# Patient Record
Sex: Male | Born: 1935 | Race: White | Hispanic: No | Marital: Married | Smoking: Former smoker
Health system: Southern US, Community
[De-identification: ages and names within clinical notes are randomized; demographics above are authoritative.]

## PROBLEM LIST (undated history)

## (undated) DIAGNOSIS — F32A Depression, unspecified: Secondary | ICD-10-CM

## (undated) DIAGNOSIS — M545 Low back pain, unspecified: Secondary | ICD-10-CM

## (undated) DIAGNOSIS — E785 Hyperlipidemia, unspecified: Secondary | ICD-10-CM

## (undated) DIAGNOSIS — R42 Dizziness and giddiness: Secondary | ICD-10-CM

## (undated) DIAGNOSIS — Z950 Presence of cardiac pacemaker: Secondary | ICD-10-CM

## (undated) DIAGNOSIS — I4819 Other persistent atrial fibrillation: Secondary | ICD-10-CM

## (undated) DIAGNOSIS — G8929 Other chronic pain: Secondary | ICD-10-CM

## (undated) DIAGNOSIS — S329XXA Fracture of unspecified parts of lumbosacral spine and pelvis, initial encounter for closed fracture: Secondary | ICD-10-CM

## (undated) DIAGNOSIS — I495 Sick sinus syndrome: Secondary | ICD-10-CM

## (undated) DIAGNOSIS — K529 Noninfective gastroenteritis and colitis, unspecified: Secondary | ICD-10-CM

## (undated) DIAGNOSIS — L989 Disorder of the skin and subcutaneous tissue, unspecified: Secondary | ICD-10-CM

## (undated) DIAGNOSIS — I10 Essential (primary) hypertension: Secondary | ICD-10-CM

## (undated) DIAGNOSIS — M1990 Unspecified osteoarthritis, unspecified site: Secondary | ICD-10-CM

## (undated) DIAGNOSIS — K219 Gastro-esophageal reflux disease without esophagitis: Secondary | ICD-10-CM

## (undated) DIAGNOSIS — F0390 Unspecified dementia without behavioral disturbance: Secondary | ICD-10-CM

## (undated) DIAGNOSIS — R9430 Abnormal result of cardiovascular function study, unspecified: Secondary | ICD-10-CM

## (undated) DIAGNOSIS — H9190 Unspecified hearing loss, unspecified ear: Secondary | ICD-10-CM

## (undated) DIAGNOSIS — I2510 Atherosclerotic heart disease of native coronary artery without angina pectoris: Secondary | ICD-10-CM

## (undated) DIAGNOSIS — F419 Anxiety disorder, unspecified: Secondary | ICD-10-CM

## (undated) DIAGNOSIS — F329 Major depressive disorder, single episode, unspecified: Secondary | ICD-10-CM

## (undated) DIAGNOSIS — J189 Pneumonia, unspecified organism: Secondary | ICD-10-CM

## (undated) DIAGNOSIS — E039 Hypothyroidism, unspecified: Secondary | ICD-10-CM

## (undated) DIAGNOSIS — Z8739 Personal history of other diseases of the musculoskeletal system and connective tissue: Secondary | ICD-10-CM

## (undated) DIAGNOSIS — J449 Chronic obstructive pulmonary disease, unspecified: Secondary | ICD-10-CM

## (undated) HISTORY — DX: Hyperlipidemia, unspecified: E78.5

## (undated) HISTORY — DX: Disorder of the skin and subcutaneous tissue, unspecified: L98.9

## (undated) HISTORY — DX: Dizziness and giddiness: R42

## (undated) HISTORY — DX: Sick sinus syndrome: I49.5

## (undated) HISTORY — DX: Presence of cardiac pacemaker: Z95.0

## (undated) HISTORY — DX: Unspecified osteoarthritis, unspecified site: M19.90

## (undated) HISTORY — DX: Essential (primary) hypertension: I10

## (undated) HISTORY — DX: Unspecified dementia, unspecified severity, without behavioral disturbance, psychotic disturbance, mood disturbance, and anxiety: F03.90

## (undated) HISTORY — DX: Depression, unspecified: F32.A

## (undated) HISTORY — DX: Major depressive disorder, single episode, unspecified: F32.9

## (undated) HISTORY — PX: JOINT REPLACEMENT: SHX530

## (undated) HISTORY — PX: BACK SURGERY: SHX140

## (undated) HISTORY — DX: Hypothyroidism, unspecified: E03.9

## (undated) HISTORY — DX: Anxiety disorder, unspecified: F41.9

## (undated) HISTORY — DX: Gastro-esophageal reflux disease without esophagitis: K21.9

## (undated) HISTORY — DX: Chronic obstructive pulmonary disease, unspecified: J44.9

## (undated) HISTORY — PX: TONSILLECTOMY: SUR1361

## (undated) HISTORY — DX: Atherosclerotic heart disease of native coronary artery without angina pectoris: I25.10

## (undated) HISTORY — DX: Fracture of unspecified parts of lumbosacral spine and pelvis, initial encounter for closed fracture: S32.9XXA

## (undated) HISTORY — DX: Noninfective gastroenteritis and colitis, unspecified: K52.9

## (undated) HISTORY — DX: Abnormal result of cardiovascular function study, unspecified: R94.30

## (undated) HISTORY — DX: Unspecified hearing loss, unspecified ear: H91.90

## (undated) HISTORY — DX: Other persistent atrial fibrillation: I48.19

## (undated) HISTORY — PX: CATARACT EXTRACTION W/ INTRAOCULAR LENS  IMPLANT, BILATERAL: SHX1307

---

## 1973-05-11 HISTORY — PX: LUMBAR SPINE SURGERY: SHX701

## 1999-07-22 ENCOUNTER — Other Ambulatory Visit: Admission: RE | Admit: 1999-07-22 | Discharge: 1999-07-22 | Payer: Self-pay | Admitting: Plastic Surgery

## 1999-07-22 ENCOUNTER — Encounter (INDEPENDENT_AMBULATORY_CARE_PROVIDER_SITE_OTHER): Payer: Self-pay | Admitting: Specialist

## 2002-05-09 ENCOUNTER — Ambulatory Visit (HOSPITAL_COMMUNITY): Admission: RE | Admit: 2002-05-09 | Discharge: 2002-05-09 | Payer: Self-pay | Admitting: Cardiology

## 2002-07-26 ENCOUNTER — Observation Stay (HOSPITAL_COMMUNITY): Admission: AD | Admit: 2002-07-26 | Discharge: 2002-07-26 | Payer: Self-pay | Admitting: Cardiology

## 2004-03-28 ENCOUNTER — Ambulatory Visit: Payer: Self-pay | Admitting: Cardiology

## 2004-04-04 ENCOUNTER — Ambulatory Visit: Payer: Self-pay | Admitting: Cardiology

## 2004-04-11 ENCOUNTER — Ambulatory Visit: Payer: Self-pay | Admitting: Cardiology

## 2004-04-29 ENCOUNTER — Ambulatory Visit: Payer: Self-pay | Admitting: Cardiology

## 2004-05-06 ENCOUNTER — Ambulatory Visit: Payer: Self-pay | Admitting: Cardiology

## 2004-05-27 ENCOUNTER — Ambulatory Visit: Payer: Self-pay | Admitting: Cardiology

## 2004-06-27 ENCOUNTER — Ambulatory Visit: Payer: Self-pay | Admitting: Cardiology

## 2004-06-30 ENCOUNTER — Ambulatory Visit: Payer: Self-pay | Admitting: Cardiology

## 2004-07-14 ENCOUNTER — Ambulatory Visit: Payer: Self-pay | Admitting: Cardiology

## 2004-08-12 ENCOUNTER — Ambulatory Visit: Payer: Self-pay | Admitting: Internal Medicine

## 2004-09-09 ENCOUNTER — Ambulatory Visit: Payer: Self-pay | Admitting: Cardiology

## 2004-10-10 ENCOUNTER — Ambulatory Visit: Payer: Self-pay | Admitting: Cardiology

## 2004-11-14 ENCOUNTER — Ambulatory Visit: Payer: Self-pay | Admitting: Internal Medicine

## 2004-11-24 ENCOUNTER — Ambulatory Visit: Payer: Self-pay | Admitting: Cardiology

## 2004-12-22 ENCOUNTER — Ambulatory Visit: Payer: Self-pay | Admitting: Cardiology

## 2005-01-29 ENCOUNTER — Ambulatory Visit: Payer: Self-pay | Admitting: Cardiology

## 2005-03-23 ENCOUNTER — Ambulatory Visit: Payer: Self-pay | Admitting: Cardiology

## 2005-04-16 ENCOUNTER — Ambulatory Visit: Payer: Self-pay | Admitting: Internal Medicine

## 2005-05-18 ENCOUNTER — Ambulatory Visit: Payer: Self-pay | Admitting: Cardiology

## 2005-05-20 ENCOUNTER — Ambulatory Visit: Payer: Self-pay | Admitting: Cardiology

## 2005-05-27 ENCOUNTER — Ambulatory Visit: Payer: Self-pay | Admitting: Cardiology

## 2005-06-03 ENCOUNTER — Ambulatory Visit: Payer: Self-pay | Admitting: Cardiology

## 2005-06-10 ENCOUNTER — Ambulatory Visit: Payer: Self-pay | Admitting: Cardiology

## 2005-06-23 ENCOUNTER — Ambulatory Visit: Payer: Self-pay | Admitting: Internal Medicine

## 2005-06-29 ENCOUNTER — Ambulatory Visit: Payer: Self-pay | Admitting: Cardiology

## 2005-07-14 ENCOUNTER — Ambulatory Visit: Payer: Self-pay | Admitting: Cardiology

## 2005-07-16 ENCOUNTER — Ambulatory Visit: Payer: Self-pay | Admitting: Internal Medicine

## 2005-07-21 ENCOUNTER — Ambulatory Visit: Payer: Self-pay | Admitting: Cardiology

## 2005-07-22 ENCOUNTER — Ambulatory Visit: Payer: Self-pay | Admitting: Cardiology

## 2005-08-26 ENCOUNTER — Ambulatory Visit: Payer: Self-pay | Admitting: Cardiology

## 2005-10-09 ENCOUNTER — Ambulatory Visit: Payer: Self-pay | Admitting: Cardiology

## 2005-12-09 ENCOUNTER — Ambulatory Visit: Payer: Self-pay | Admitting: Cardiology

## 2005-12-22 ENCOUNTER — Ambulatory Visit: Payer: Self-pay | Admitting: Cardiology

## 2006-01-01 ENCOUNTER — Ambulatory Visit: Payer: Self-pay | Admitting: Internal Medicine

## 2006-01-14 ENCOUNTER — Ambulatory Visit: Payer: Self-pay | Admitting: Internal Medicine

## 2006-01-27 ENCOUNTER — Ambulatory Visit: Payer: Self-pay | Admitting: Internal Medicine

## 2006-02-01 ENCOUNTER — Ambulatory Visit: Payer: Self-pay | Admitting: Internal Medicine

## 2006-02-01 ENCOUNTER — Ambulatory Visit (HOSPITAL_COMMUNITY): Admission: RE | Admit: 2006-02-01 | Discharge: 2006-02-02 | Payer: Self-pay | Admitting: Internal Medicine

## 2006-02-01 HISTORY — PX: INSERT / REPLACE / REMOVE PACEMAKER: SUR710

## 2006-02-03 ENCOUNTER — Ambulatory Visit: Payer: Self-pay | Admitting: Internal Medicine

## 2006-02-12 ENCOUNTER — Ambulatory Visit: Payer: Self-pay | Admitting: Cardiology

## 2006-02-15 ENCOUNTER — Ambulatory Visit: Payer: Self-pay

## 2006-02-19 ENCOUNTER — Ambulatory Visit: Payer: Self-pay | Admitting: Cardiology

## 2006-03-23 ENCOUNTER — Ambulatory Visit: Payer: Self-pay | Admitting: Cardiology

## 2006-04-21 ENCOUNTER — Ambulatory Visit: Payer: Self-pay | Admitting: Cardiology

## 2006-04-23 ENCOUNTER — Ambulatory Visit (HOSPITAL_COMMUNITY): Admission: RE | Admit: 2006-04-23 | Discharge: 2006-04-23 | Payer: Self-pay | Admitting: Orthopedic Surgery

## 2006-04-23 HISTORY — PX: KNEE ARTHROSCOPY: SHX127

## 2006-05-25 ENCOUNTER — Ambulatory Visit: Payer: Self-pay | Admitting: Internal Medicine

## 2006-07-15 ENCOUNTER — Ambulatory Visit: Payer: Self-pay | Admitting: Gastroenterology

## 2006-08-18 ENCOUNTER — Ambulatory Visit: Payer: Self-pay | Admitting: Internal Medicine

## 2006-12-02 ENCOUNTER — Ambulatory Visit: Payer: Self-pay | Admitting: Internal Medicine

## 2007-03-02 ENCOUNTER — Ambulatory Visit: Payer: Self-pay | Admitting: Internal Medicine

## 2007-05-02 ENCOUNTER — Encounter: Payer: Self-pay | Admitting: Orthopedic Surgery

## 2007-05-13 ENCOUNTER — Inpatient Hospital Stay (HOSPITAL_COMMUNITY): Admission: RE | Admit: 2007-05-13 | Discharge: 2007-05-16 | Payer: Self-pay | Admitting: Orthopedic Surgery

## 2007-05-13 HISTORY — PX: TOTAL KNEE ARTHROPLASTY: SHX125

## 2007-06-01 ENCOUNTER — Ambulatory Visit: Payer: Self-pay | Admitting: Internal Medicine

## 2007-09-07 ENCOUNTER — Ambulatory Visit: Payer: Self-pay | Admitting: Internal Medicine

## 2007-11-14 ENCOUNTER — Ambulatory Visit: Payer: Self-pay | Admitting: Internal Medicine

## 2008-02-15 ENCOUNTER — Ambulatory Visit: Payer: Self-pay | Admitting: Internal Medicine

## 2008-05-16 ENCOUNTER — Ambulatory Visit: Payer: Self-pay | Admitting: Internal Medicine

## 2008-08-15 ENCOUNTER — Ambulatory Visit: Payer: Self-pay | Admitting: Internal Medicine

## 2008-08-24 ENCOUNTER — Encounter (INDEPENDENT_AMBULATORY_CARE_PROVIDER_SITE_OTHER): Payer: Self-pay

## 2008-11-19 DIAGNOSIS — I10 Essential (primary) hypertension: Secondary | ICD-10-CM | POA: Insufficient documentation

## 2008-11-19 DIAGNOSIS — I495 Sick sinus syndrome: Secondary | ICD-10-CM

## 2008-11-19 DIAGNOSIS — I4891 Unspecified atrial fibrillation: Secondary | ICD-10-CM

## 2008-11-19 DIAGNOSIS — R42 Dizziness and giddiness: Secondary | ICD-10-CM

## 2008-11-19 DIAGNOSIS — E785 Hyperlipidemia, unspecified: Secondary | ICD-10-CM | POA: Insufficient documentation

## 2008-11-19 DIAGNOSIS — Z950 Presence of cardiac pacemaker: Secondary | ICD-10-CM | POA: Insufficient documentation

## 2008-11-19 DIAGNOSIS — K5190 Ulcerative colitis, unspecified, without complications: Secondary | ICD-10-CM | POA: Insufficient documentation

## 2008-11-20 ENCOUNTER — Ambulatory Visit: Payer: Self-pay | Admitting: Internal Medicine

## 2009-02-27 ENCOUNTER — Ambulatory Visit: Payer: Self-pay | Admitting: Internal Medicine

## 2009-03-07 ENCOUNTER — Encounter: Payer: Self-pay | Admitting: Internal Medicine

## 2009-06-05 ENCOUNTER — Ambulatory Visit: Payer: Self-pay | Admitting: Internal Medicine

## 2009-06-06 ENCOUNTER — Encounter: Payer: Self-pay | Admitting: Internal Medicine

## 2009-06-13 ENCOUNTER — Encounter: Payer: Self-pay | Admitting: Internal Medicine

## 2009-09-04 ENCOUNTER — Ambulatory Visit: Payer: Self-pay | Admitting: Internal Medicine

## 2009-09-20 ENCOUNTER — Encounter: Payer: Self-pay | Admitting: Internal Medicine

## 2009-11-19 ENCOUNTER — Ambulatory Visit: Payer: Self-pay | Admitting: Internal Medicine

## 2010-02-20 ENCOUNTER — Encounter: Payer: Self-pay | Admitting: Internal Medicine

## 2010-02-20 ENCOUNTER — Ambulatory Visit: Payer: Self-pay | Admitting: Internal Medicine

## 2010-03-14 ENCOUNTER — Encounter (INDEPENDENT_AMBULATORY_CARE_PROVIDER_SITE_OTHER): Payer: Self-pay | Admitting: *Deleted

## 2010-04-17 ENCOUNTER — Encounter: Payer: Self-pay | Admitting: Orthopedic Surgery

## 2010-05-06 ENCOUNTER — Encounter: Payer: Self-pay | Admitting: Internal Medicine

## 2010-05-14 ENCOUNTER — Ambulatory Visit
Admission: RE | Admit: 2010-05-14 | Discharge: 2010-05-14 | Payer: Self-pay | Source: Home / Self Care | Attending: Internal Medicine | Admitting: Internal Medicine

## 2010-05-14 ENCOUNTER — Encounter: Payer: Self-pay | Admitting: Internal Medicine

## 2010-05-27 ENCOUNTER — Encounter: Payer: Self-pay | Admitting: Orthopedic Surgery

## 2010-06-05 ENCOUNTER — Ambulatory Visit
Admission: RE | Admit: 2010-06-05 | Discharge: 2010-06-05 | Payer: Self-pay | Source: Home / Self Care | Attending: Orthopedic Surgery | Admitting: Orthopedic Surgery

## 2010-06-05 ENCOUNTER — Encounter: Payer: Self-pay | Admitting: Orthopedic Surgery

## 2010-06-05 DIAGNOSIS — G5790 Unspecified mononeuropathy of unspecified lower limb: Secondary | ICD-10-CM | POA: Insufficient documentation

## 2010-06-05 DIAGNOSIS — IMO0002 Reserved for concepts with insufficient information to code with codable children: Secondary | ICD-10-CM

## 2010-06-05 DIAGNOSIS — M79609 Pain in unspecified limb: Secondary | ICD-10-CM | POA: Insufficient documentation

## 2010-06-05 DIAGNOSIS — M1710 Unilateral primary osteoarthritis, unspecified knee: Secondary | ICD-10-CM | POA: Insufficient documentation

## 2010-06-06 ENCOUNTER — Encounter (INDEPENDENT_AMBULATORY_CARE_PROVIDER_SITE_OTHER): Payer: Self-pay | Admitting: *Deleted

## 2010-06-09 ENCOUNTER — Encounter: Payer: Self-pay | Admitting: Orthopedic Surgery

## 2010-06-11 ENCOUNTER — Encounter: Payer: Self-pay | Admitting: Orthopedic Surgery

## 2010-06-12 NOTE — Letter (Signed)
Summary: History form  History form   Imported By: Ruffin Pyo 06/06/2010 13:28:04  _____________________________________________________________________  External Attachment:    Type:   Image     Comment:   External Document

## 2010-06-12 NOTE — Letter (Signed)
Summary: Remote Device Check  [facility] [place]  [address] [place]   [place], [place] [address]   Phone: [phone]  Fax: [phone]     June 13, 2009 MRN: [id number]   [place], [place]  [address]   Dear Mr. Barnwell,   Your remote transmission was recieved and reviewed by your physician.  All diagnostics were within normal limits for you.  __X___Your next transmission is scheduled for:      September 04, 2009.  Please transmit at any time this day.  If you have a wireless device your transmission will be sent automatically.     Sincerely,  Hotel manager

## 2010-06-12 NOTE — Miscellaneous (Signed)
Summary: Device preload  Clinical Lists Changes  Observations: Added new observation of PPM INDICATN: Sick sinus syndrome (05/06/2010 13:20) Added new observation of MAGNET RTE: BOL 85 ERI 65 (05/06/2010 13:20) Added new observation of PPMLEADSTAT2: active (05/06/2010 13:20) Added new observation of PPMLEADSER2: [id number] (05/06/2010 13:20) Added new observation of PPMLEADMOD2: 5076  (05/06/2010 13:20) Added new observation of PPMLEADDOI2: 02/01/2006  (05/06/2010 13:20) Added new observation of PPMLEADLOC2: RV  (05/06/2010 13:20) Added new observation of PPMLEADSTAT1: active  (05/06/2010 13:20) Added new observation of PPMLEADSER1: [id number]  (05/06/2010 13:20) Added new observation of PPMLEADMOD1: 5076  (05/06/2010 13:20) Added new observation of PPMLEADDOI1: 02/01/2006  (05/06/2010 13:20) Added new observation of PPMLEADLOC1: RA  (05/06/2010 13:20) Added new observation of PPM IMP MD: Cristopher Peru, MD  (05/06/2010 13:20) Added new observation of PPM DOI: 02/01/2006  (05/06/2010 13:20) Added new observation of PPM SERL#: [id number]  (05/06/2010 13:20) Added new observation of PPM MODL#: [id number]  (05/06/2010 13:20) Added new observation of PACEMAKERMFG: Medtronic  (05/06/2010 13:20) Added new observation of PACEMAKER MD: Cristopher Peru, MD  (05/06/2010 13:20)      PPM Specifications Following MD:  Cristopher Peru, MD     PPM Vendor:  Medtronic     PPM Model Number:  [id number]     PPM Serial Number:  [id number] H PPM DOI:  02/01/2006     PPM Implanting MD:  Cristopher Peru, MD  Lead 1    Location: RA     DOI: 02/01/2006     Model #: [id number]     Serial #: [id number]     Status: active Lead 2    Location: RV     DOI: 02/01/2006     Model #: [id number]     Serial #: [id number]     Status: active  Magnet Response Rate:  BOL 85 ERI 65  Indications:  Sick sinus syndrome

## 2010-06-12 NOTE — Cardiovascular Report (Signed)
Summary: Office Visit   Office Visit   Imported By: Sallee Provencal 11/22/2009 11:54:52  _____________________________________________________________________  External Attachment:    Type:   Image     Comment:   External Document

## 2010-06-12 NOTE — Assessment & Plan Note (Signed)
Summary: RT KNEE PAIN BRINGING XR /HUMANA/AETNA/DANIEL/BSF   Vital Signs:  Patient profile:   76 year old male Height:      72 inches Weight:      173 pounds BMI:     23.55 Pulse rate:   78 / minute Resp:     16 per minute  Vitals Entered By: Arther Abbott MD (June 05, 2010 10:42 AM)  Visit Type:  new patient Referring Provider:  Dr. Quillian Quince Primary Provider:  Kern Alberta, MD  CC:  right knee pain.  History of Present Illness:    76 year old male presents with a lateral RIGHT knee pain and pain behind his knee and down his RIGHT leg, which is sharp throbbing, stabbing, and reported 8/10, exacerbated by working, walking, and any movement. He is noted to have improvement with sitting and lying down. The pain is constant and associated with some tingling. He really doesn't have a lot of joint line pain. Anteriorly, medial or lateral.  His pain has been present around 6 months. He did have an episode where he did a lot of walking and started having pain behind his leg. That was diagnosed as a hamstring injury. He denies any major trauma. At that time just increased walking and pain in the back of the leg,.  He's had a LEFT total knee in 2009 he did well and has excellent flexion and function of that leg. That was done by Dr. Chaney Malling in [place].  He has recently been seen by his cardiologist, Dr. Crissie Sickles on January 5. He had excellent checkup at that time.  30 years ago. He also had a lumbar disc surgery.  Xrays [facility], on disc from 04/17/10.  No injury.  Meds: Mesalamine, Simvastatin, Atenolol, Coumadin 17m, ASA 844m Arthritis Tylenol.       Allergies: 1)  ! * Antiflammatory  Past History:  Past Medical History: HTN Dyslipidemia Atrial fib has pacemaker ulcerative colitis  Past Surgical History: implantation of a Medtronic dual chamber pacemaker  02/01/2006   GrChamp MungoTaLovena LeMD    Left knee arthroscopy with debridement  04/23/2006   StDoran HeaterNoVeverly FellsM.D.   Left knee, total knee replacement.   05/13/2007  StDoran HeaterNoVeverly FellsM.D Lower back  Family History: FH of Cancer:  Hx, family, chronic respiratory condition  Social History: Patient is married.  retired no smoking no alcohol 2 cups of coffee daily 12th grade ed  Review of Systems Constitutional:  Complains of fatigue; denies weight loss, weight gain, fever, and chills. Cardiovascular:  Denies chest pain, palpitations, fainting, and murmurs. Respiratory:  Complains of short of breath; denies wheezing, couch, tightness, pain on inspiration, and snoring . Gastrointestinal:  Denies heartburn, nausea, vomiting, diarrhea, constipation, and blood in your stools. Genitourinary:  Denies frequency, urgency, difficulty urinating, painful urination, flank pain, and bleeding in urine. Neurologic:  Denies numbness, tingling, unsteady gait, dizziness, tremors, and seizure. Musculoskeletal:  Complains of joint pain and muscle pain; denies swelling, instability, stiffness, redness, and heat. Endocrine:  Denies excessive thirst, exessive urination, and heat or cold intolerance. Psychiatric:  Complains of depression; denies nervousness, anxiety, and hallucinations. Skin:  Denies changes in the skin, poor healing, rash, itching, and redness. HEENT:  Complains of blurred or double vision; denies eye pain, redness, and watering. Immunology:  Denies seasonal allergies, sinus problems, and allergic to bee stings. Hemoatologic:  Complains of easy bleeding and brusing.  Physical Exam  Additional Exam:  GEN: well developed, well nourished, normal grooming and  hygiene, no deformity and normal body habitus.   CDV: pulses are normal, no edema, no erythema. no tenderness  Lymph: normal lymph nodes   Skin: no rashes, skin lesions or open sores   NEURO: normal coordination, reflexes, sensation.   Psyche: awake, alert and oriented. Mood normal   Gait: seems normal    The upper  extremities have normal appearance, ROM, strength and stability.    LEFT KNEE inspection reveals a normal healed, nontender, incision over the anterior LEFT knee. He has full extension, if not, a little hyperextension , he has normal strength in the limb. He is stable at 106.  RIGHT knee, inspection reveals varus malalignment to the knee. He has very minimal tenderness over the medial or lateral joint line. He does have some swelling over the iliotibial band near Gerdy's tubercle. Tinel sign at the fibular head was normal. His range of motion was approximately 5-90 active with 15 additional flexion passively, but this caused him significant knee pain.  Had a normal hip exam.  He had a normal. Back exam.  Pinprick test was normal. Both legs.  Excellent pulses as noted above.    Inspection ROM Motor Stability    Impression & Recommendations:  Problem # 1:  UNSPECIFIED MONONEURITIS OF LOWER LIMB (ICD-355.8) Assessment New  outside film.  Reviewed with 4 views of the RIGHT knee. He has osteoarthritis of the RIGHT knee with endstage disease.  I would like to work him up for neuralgia/neuritis of the RIGHT lower extremity as a contributing factor to his leg pain. I did not think his lateral leg. Pain is coming from his knee joint. He does have knee osteoarthritis and does have some discomfort there from that and x-rays that do not match his overall symptoms. His x-rays look worse than his symptoms indicate.  Orders: New Patient Level IV (57322)  Problem # 2:  ARTHRITIS, RIGHT KNEE (ICD-716.96) Assessment: New  Orders: New Patient Level IV (02542)  Other Orders: Neurology Referral (Neuro)  Patient Instructions: 1)  Refer TO NEUROLOGIST FOR NCS LOWER EXTREMITIES 2)  COME BACK FOR RESULTS AND PREOP FOR SURGERY   Orders Added: 1)  Neurology Referral [Neuro] 2)  New Patient Level IV [70623]

## 2010-06-12 NOTE — Assessment & Plan Note (Signed)
Summary: [employer]/SAF   Visit Type:  Device check   History of Present Illness: [name] returns today for followup.  He is a pleasant 76 yo man with a h/o symptomatic tachybrady, s/p PPM, HTN, and dyslipidemia.  He c/o pain in his knee and wonders if he needs knee replacement.  No othe complaints today except he notes some memory problems.  Current Medications (verified): 1)  Asacol 400 Mg Tbec (Mesalamine) .... Take 4 Tablets Two Times A Day 2)  Warfarin Sodium 5 Mg Tabs (Warfarin Sodium) .... Use As Directed By Anticoagulation Clinic 3)  Atenolol 100 Mg Tabs (Atenolol) .... Take One Tablet By Mouth Two Times A Day 4)  Simvastatin 80 Mg Tabs (Simvastatin) .... Take 1/2  Tablet By Mouth Daily At Bedtime 5)  Aspirin 81 Mg Tbec (Aspirin) .... Take One Tablet By Mouth Daily  Allergies (verified): No Known Drug Allergies  Past History:  Past Medical History: HTN Dyslipidemia Atrial fib  Review of Systems  The patient denies chest pain, syncope, dyspnea on exertion, and peripheral edema.    Vital Signs:  Patient profile:   76 year old male Height:      73 inches Weight:      175 pounds BMI:     23.17 Pulse rate:   72 / minute Pulse rhythm:   regular Resp:     18 per minute BP sitting:   148 / 90  (left arm) Cuff size:   large  Vitals Entered By: Sidney Ace (May 14, 2010 11:16 AM)  Physical Exam  General:  Well developed, well nourished, in no acute distress.  HEENT: normal Neck: supple. No JVD. Carotids 2+ bilaterally no bruits Cor: RRR no rubs, gallops or murmur Lungs: CTA. Well healed PPM incision. Ab: soft, nontender. nondistended. No HSM. Good bowel sounds Ext: warm. no cyanosis, clubbing or edema Neuro: alert and oriented. Grossly nonfocal. affect pleasant    EKG  Procedure date:  05/14/2010  Findings:      Normal sinus rhythm with rate of: 65. Atrium is paced.    PPM Specifications Following MD:  Cristopher Peru, MD     PPM Vendor:  Medtronic      PPM Model Number:  [id number]     PPM Serial Number:  [id number] PPM DOI:  02/01/2006     PPM Implanting MD:  Cristopher Peru, MD  Lead 1    Location: RA     DOI: 02/01/2006     Model #: 1884     Serial #: [id number]     Status: active Lead 2    Location: RV     DOI: 02/01/2006     Model #: 1093     Serial #: [id number]     Status: active  Magnet Response Rate:  BOL 85 ERI 65  Indications:  Sick sinus syndrome   PPM Follow Up Battery Voltage:  2.78 V     Battery Est. Longevity:  6 YRS       PPM Device Measurements Atrium  Amplitude: 5.60 mV, Impedance: 537 ohms, Threshold: 0.50 V at 0.40 msec Right Ventricle  Amplitude: 5.60 mV, Impedance: 457 ohms, Threshold: 0.750 V at 0.40 msec  Episodes MS Episodes:  357     Percent Mode Switch:  26.7%     Coumadin:  Yes Ventricular High Rate:  0     Atrial Pacing:  89.5%     Ventricular Pacing:  0.8%  Parameters Mode:  DDDR     Lower  Rate Limit:  60     Upper Rate Limit:  130 Paced AV Delay:  300     Sensed AV Delay:  300 Next Remote Date:  08/14/2010     Next Cardiology Appt Due:  05/12/2011 Tech Comments:  357 MODE SWITCHES--LONGEST WAS 76 HOURS. + COUMADIN.  NORMAL DEVICE FUNCTION.  CHANGED RA OUTPUT FROM 1.5 TO 2.0 AND RV OUTPUT FROM 2.0 TO 2.5 V.  CARELINK 08-14-10 AND ROV IN 12 MTHS W/GT. Shelly Bombard  May 14, 2010 11:44 AM  MD Comments:  Agree with above.  Impression & Recommendations:  Problem # 1:  ATRIAL FIBRILLATION (ICD-427.31) He has maintained NSR nicely by PPM interogation today.  He will continue his meds as below. His updated medication list for this problem includes:    Warfarin Sodium 5 Mg Tabs (Warfarin sodium) ..... Use as directed by anticoagulation clinic    Atenolol 100 Mg Tabs (Atenolol) .Marland Kitchen... Take one tablet by mouth two times a day    Aspirin 81 Mg Tbec (Aspirin) .Marland Kitchen... Take one tablet by mouth daily  Problem # 2:  CARDIAC PACEMAKER IN SITU (ICD-V45.01) His device is working normally. Will recheck in several  months.

## 2010-06-12 NOTE — Letter (Signed)
Summary: *Orthopedic Consult Note  [facility]  [address] [place] [address]  [place], [place] [address]   Phone: [phone]  Fax: [phone]    Re:    WHITT AULETTA DOB:    [birth year]   Dear: Dr. Quillian Quince    Thank you for requesting that we see the above patient for consultation.  A copy of the detailed office note will be sent under separate cover, for your review.  Evaluation today is consistent with: osteoarthritis RIGHT knee with endstage disease. Mononeuritis RIGHT leg.  Her recommendations for nerve conduction study followup visit and then discuss total knee replacement.  We will also try to correspond with the cardiologist regarding knee replacement. I understand that he had a recent checkup for his heart and it checked out very well. We would like to have a written statement regarding clearance for knee replacement surgery.   Thank you for this opportunity to look after your patient.  Sincerely,   Demetrius Revel. MD.

## 2010-06-12 NOTE — Cardiovascular Report (Signed)
Summary: Office Visit Remote   Office Visit Remote   Imported By: Sallee Provencal 09/27/2009 15:31:48  _____________________________________________________________________  External Attachment:    Type:   Image     Comment:   External Document

## 2010-06-12 NOTE — Assessment & Plan Note (Signed)
Summary: pc2    Visit Type:  Follow-up Primary Provider:  Kern Alberta, MD   History of Present Illness: [name] returns today for followup.  He has a h/o symptomatic brady-tachy syndrome and HTN, and is s/p PPM.  He notes that he can feel when he goes into atrial fib and that this may be occurring more frequently.  He denies c/p or sob.  He admits to being fairly sedentary as it has been so hot.  Current Medications (verified): 1)  Atenolol 100 Mg Tabs (Atenolol) .... Take One Tablet By Mouth Twice A Day 2)  Aspirin 81 Mg Tbec (Aspirin) .... Take One Tablet By Mouth Daily 3)  Multivitamins   Tabs (Multiple Vitamin) .Marland Kitchen.. 1 By Mouth Once Daily 4)  Simvastatin 80 Mg Tabs (Simvastatin) .... Take Half  Tablet By Mouth Daily At Bedtime 5)  Warfarin Sodium 5 Mg Tabs (Warfarin Sodium) .... Use As Directed By Anticoagulation Clinic 6)  Asacol 400 Mg Tbec (Mesalamine) .... 3 By Mouth Once Daily  Allergies: 1)  ! * Antiflammatory  Past History:  Past Medical History: Last updated: 11/19/2008 ULCERATIVE COLITIS (ICD-556.9) PACEMAKER, PERMANENT (ICD-V45.01) DYSLIPIDEMIA (ICD-272.4) BRADYCARDIA-TACHYCARDIA SYNDROME (ICD-427.81) COUMADIN THERAPY (ICD-V58.61) HYPERTENSION (ICD-401.9) DIZZINESS (ICD-780.4) ? of BRADYCARDIA (ICD-427.89) PAROXYSMAL ATRIAL FIBRILLATION (ICD-427.31)  Past Surgical History: Last updated: 11/19/2008 implantation of a Medtronic dual chamber pacemaker  02/01/2006   [name] [name], MD    Left knee arthroscopy with debridement  04/23/2006  [name], M.D.   Left knee, total knee replacement.   05/13/2007  [name], M.D  Review of Systems  The patient denies chest pain, syncope, dyspnea on exertion, and peripheral edema.    Vital Signs:  Patient profile:   76 year old male Height:      73 inches Weight:      181 pounds BMI:     23.97 Pulse rate:   88 / minute BP sitting:   138 / 74  (left arm)  Vitals Entered By: Margaretmary Bayley CMA (November 19, 2009 1:57 PM)  Physical Exam  General:  Well developed, well nourished, in no acute distress. Head:  normocephalic and atraumatic Eyes:  PERRLA/EOM intact; conjunctiva and lids normal. Mouth:  Teeth, gums and palate normal. Oral mucosa normal. Neck:  Neck supple, no JVD. No masses, thyromegaly or abnormal cervical nodes. Chest Wall:  Well healed PPM incision. Lungs:  Clear bilaterally with no wheezes, rales, or rhonchi or increased work of breathing. Heart:  RRR with normal S1 and S2.  PMI is not enlarged or laterally displaced.  No murmurs were appreciated. Abdomen:  Bowel sounds positive; abdomen soft and non-tender without masses, organomegaly, or hernias noted. No hepatosplenomegaly. Msk:  Back normal, normal gait. Muscle strength and tone normal. Pulses:  pulses normal in all 4 extremities Extremities:  No clubbing or cyanosis.  No edema. Neurologic:  Alert and oriented x 3.   PPM Specifications Following MD:  Cristopher Peru, MD     PPM Vendor:  Medtronic     PPM Model Number:  [id number]     PPM Serial Number:  [id number] PPM DOI:  02/01/2006     PPM Implanting MD:  Cristopher Peru, MD  Lead 1    Location: RA     DOI: 02/01/2006     Model #: [id number]     Serial #: [id number]     Status: active Lead 2    Location: RV     DOI: 02/01/2006  Model #: [id number]     Serial #: [id number]     Status: active   Indications:  Tachy-Brady Syndrome   PPM Follow Up Remote Check?  No Battery Voltage:  2.78 V     Battery Est. Longevity:  6.5 years     Pacer Dependent:  No       PPM Device Measurements Atrium  Amplitude: 4.0 mV, Impedance: 505 ohms, Threshold: 0.5 V at 0.4 msec Right Ventricle  Amplitude: 5.6 mV, Impedance: 451 ohms, Threshold: 0.75 V at 0.4 msec  Episodes MS Episodes:  713     Percent Mode Switch:  28%     Coumadin:  Yes Ventricular High Rate:  0     Atrial Pacing:  86.5%     Ventricular Pacing:  1.0%  Parameters Mode:  DDDR     Lower Rate Limit:  60     Upper Rate Limit:   130 Paced AV Delay:  300     Sensed AV Delay:  300 Next Remote Date:  02/20/2010     Next Cardiology Appt Due:  05/11/2010 Tech Comments:  No parameter changes.  28% A-fib with controlled ventricular rates, + coumadin.  [facility]link transmissions every 3 months.  ROV 6 months with Dr. [name]. Alma Friendly, LPN  November 20, 4191 7:90 PM  MD Comments:  Agree with above.  Impression & Recommendations:  Problem # 1:  PACEMAKER, PERMANENT (ICD-V45.01) His device is working normally.  Will recheck in several months as he has been having more atrial fib.  Problem # 2:  BRADYCARDIA-TACHYCARDIA SYNDROME (ICD-427.81) He has remained minimally asymptomatic. His updated medication list for this problem includes:    Atenolol 100 Mg Tabs (Atenolol) .Marland Kitchen... Take one tablet by mouth twice a day    Aspirin 81 Mg Tbec (Aspirin) .Marland Kitchen... Take one tablet by mouth daily    Warfarin Sodium 5 Mg Tabs (Warfarin sodium) ..... Use as directed by anticoagulation clinic  Problem # 3:  PAROXYSMAL ATRIAL FIBRILLATION (ICD-427.31) He has had atrial fib 28% of the time.  If this increases then we will start amipodarone or another anti-arrythmic. His updated medication list for this problem includes:    Atenolol 100 Mg Tabs (Atenolol) .Marland Kitchen... Take one tablet by mouth twice a day    Aspirin 81 Mg Tbec (Aspirin) .Marland Kitchen... Take one tablet by mouth daily    Warfarin Sodium 5 Mg Tabs (Warfarin sodium) ..... Use as directed by anticoagulation clinic  Problem # 4:  HYPERTENSION (ICD-401.9) His blood pressure is minimally elevated. A low sodium diet is recommended. His updated medication list for this problem includes:    Atenolol 100 Mg Tabs (Atenolol) .Marland Kitchen... Take one tablet by mouth twice a day    Aspirin 81 Mg Tbec (Aspirin) .Marland Kitchen... Take one tablet by mouth daily  Patient Instructions: 1)  Your physician recommends that you schedule a follow-up appointment in: 6 months 2)  Your physician recommends that you continue on your current  medications as directed. Please refer to the Current Medication list given to you today.

## 2010-06-12 NOTE — Miscellaneous (Signed)
Summary: faxed referral to Dr. Merlene Laughter for ncs lowers  Clinical Lists Changes

## 2010-06-12 NOTE — Cardiovascular Report (Signed)
Summary: Office Visit Remote   Office Visit Remote   Imported By: Sallee Provencal 06/17/2009 16:38:20  _____________________________________________________________________  External Attachment:    Type:   Image     Comment:   External Document

## 2010-06-12 NOTE — Letter (Signed)
Summary: Remote Device Check  [facility]  [address] [place]   [place], [place] [address]   Phone: [phone]  Fax: [phone]     March 14, 2010 MRN: [id number]   [place], [place]  [address]   Dear Mr. Hart,   Your remote transmission was recieved and reviewed by your physician.  All diagnostics were within normal limits for you.  _____Your next transmission is scheduled for:                       .  Please transmit at any time this day.  If you have a wireless device your transmission will be sent automatically.  __X____Your next office visit is scheduled for: January 2012 with Dr. [name].                                Sincerely,  Alma Friendly, LPN

## 2010-06-12 NOTE — Letter (Signed)
Summary: Remote Device Check  [facility] [place]  [address] [place]   [place], [place] [address]   Phone: [phone]  Fax: [phone]     Sep 20, 2009 MRN: [id number]   [place], [place]  [address]   Dear Mr. Nez,   Your remote transmission was recieved and reviewed by your physician.  All diagnostics were within normal limits for you.   __X____Your next office visit is scheduled for:   11-19-09 @ 2pm. Please call our office to schedule an appointment.    Sincerely,  Shelly Bombard

## 2010-06-12 NOTE — Cardiovascular Report (Signed)
Summary: Office Visit Remote   Office Visit Remote   Imported By: Sallee Provencal 03/18/2010 15:28:32  _____________________________________________________________________  External Attachment:    Type:   Image     Comment:   External Document

## 2010-06-16 ENCOUNTER — Telehealth: Payer: Self-pay | Admitting: Orthopedic Surgery

## 2010-06-18 NOTE — Miscellaneous (Signed)
  Clinical Lists Changes     Dr. Cristopher Peru cleared for surgery; says he is low cardiac risk. suggested placing a magnet over the PPM during surgery.

## 2010-06-26 NOTE — Progress Notes (Signed)
----   Converted from flag ---- ---- 06/16/2010 9:44 AM, Sophronia Simas, MD, [facility] wrote: No need in the absence of a prior stroke or mechanical heart valve.  ---- 06/09/2010 10:10 AM, Arther Abbott MD wrote: do we need to bridge with lovenox prior to surgery when we take him off coumadin and if so   how do we do that in terms of dosing etc.?   ---- 06/09/2010 9:28 AM, Sophronia Simas, MD, [facility] wrote: He may proceed with surgery. Low cardiac risk. May place a magnet over PPM during surgery and remove after surgery. Cristopher Peru  ---- 06/05/2010 12:00 PM, Arther Abbott MD wrote: Dear Dr [name]   Mr Vanhoesen is planning surgery on his right knee, total knee repalcement. Can you send [place] a written cardiac clearance?   As far as bridging for the coumadin, any advice ?   Dr Aline Brochure ------------------------------

## 2010-06-26 NOTE — Progress Notes (Signed)
Summary: cardilogy   ---- Converted from flag ---- ---- 06/16/2010 9:44 AM, Sophronia Simas, MD, [facility] wrote: No need in the absence of a prior stroke or mechanical heart valve.  ---- 06/09/2010 10:10 AM, Arther Abbott MD wrote: do we need to bridge with lovenox prior to surgery when we take him off coumadin and if so   how do we do that in terms of dosing etc.?   ---- 06/09/2010 9:28 AM, Sophronia Simas, MD, [facility] wrote: He may proceed with surgery. Low cardiac risk. May place a magnet over PPM during surgery and remove after surgery. [name]  ---- 06/05/2010 12:00 PM, Arther Abbott MD wrote: Dear Dr [name]   [name] is planning surgery on his right knee, total knee repalcement. Can you send [place] a written cardiac clearance?   As far as bridging for the coumadin, any advice ?   Dr Aline Brochure ------------------------------

## 2010-07-15 ENCOUNTER — Encounter: Payer: Self-pay | Admitting: Orthopedic Surgery

## 2010-07-16 ENCOUNTER — Ambulatory Visit (INDEPENDENT_AMBULATORY_CARE_PROVIDER_SITE_OTHER): Payer: Medicare HMO | Admitting: Orthopedic Surgery

## 2010-07-16 ENCOUNTER — Encounter: Payer: Self-pay | Admitting: Orthopedic Surgery

## 2010-07-16 DIAGNOSIS — G5780 Other specified mononeuropathies of unspecified lower limb: Secondary | ICD-10-CM | POA: Insufficient documentation

## 2010-07-22 NOTE — Assessment & Plan Note (Signed)
Summary: ncs results/report scanned/bsf   Visit Type:  Follow-up Referring Provider:  Dr. Quillian Quince Primary Provider:  Kern Alberta, MD  CC:  right knee pain.  History of Present Illness:    76 year old male presents with a lateral RIGHT knee pain and pain behind his knee and down his RIGHT leg, which is sharp throbbing, stabbing, and reported 8/10, exacerbated by working, walking, and any movement. He is noted to have improvement with sitting and lying down. The pain is constant and associated with some tingling. He really doesn't have a lot of joint line pain. Anteriorly, medial or lateral.  He's had a LEFT total knee in 2009 he did well and has excellent flexion and function of that leg. That was done by Dr. Chaney Malling in [place].  30 years ago. He also had a lumbar disc surgery.  Xrays [facility], on disc from 04/17/10.  Meds: Mesalamine, Simvastatin, Atenolol, Coumadin 85m, ASA 879m Arthritis Tylenol.  Today is here for NCS results taken by Dr. DoFreddie Apleyffice on 06/11/10, report for review.  Note for surgical clearance for right TKA in EMR 06/16/10.  This patient continues to have pain on the lateral part of his leg radiating down his leg into his foot and some pain occasionally medially in the leg. He continues to say that his knee does not hurt. He does have some functional deficits in the RIGHT lower extremity with occasional giving way and difficulty standing from a seated position.        Allergies: 1)  ! 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  I reviewed this with the patient and his wife. I think he has neuropathy and weakness in his RIGHT lower extremity, which can be managed with medication and therapy. Its worth a try before undergoing major reconstructive knee surgery  with a guarded outcome regarding relief of his pain.  Orders: Est. Patient Level II ([id number] Problem # 2:  ARTHRITIS, RIGHT KNEE (ICD-716.96) Assessment: Unchanged  Orders: Physical Therapy Referral (PT) Est. Patient Level II ([id number] Medications Added to Medication List This Visit: 1)  Neurontin 100 Mg Caps (Gabapentin) .... Start with 1 tablet at night increase to 2 tablets if no improvement after 3 doses  Patient Instructions: 1)  Start new medication for leg pain 2)  Start therapy for the leg 3)  come back in  4)  Please schedule a follow-up appointment in 3 weeks. Prescriptions: NEURONTIN 100 MG CAPS (GABAPENTIN) start with 1 tablet at night increase to 2 tablets if no improvement after 3 doses  #60 x 1   Entered and Authorized by:   StArther AbbottD   Signed by:   StArther AbbottD on 07/16/2010   Method used:   Print then Give to Patient   RxID:   [id number]  Orders Added: 1)  Physical Therapy Referral [PT] 2)  Est. Patient Level II [9[03754]

## 2010-07-29 NOTE — Miscellaneous (Signed)
Summary: PT plan of care  PT plan of care   Imported By: Ruffin Pyo 07/25/2010 11:09:09  _____________________________________________________________________  External Attachment:    Type:   Image     Comment:   External Document

## 2010-07-29 NOTE — Progress Notes (Signed)
Summary: Surgery clearance note  Surgery clearance note   Imported By: Ruffin Pyo 07/25/2010 11:39:26  _____________________________________________________________________  External Attachment:    Type:   Image     Comment:   External Document

## 2010-08-14 ENCOUNTER — Ambulatory Visit (INDEPENDENT_AMBULATORY_CARE_PROVIDER_SITE_OTHER): Payer: Medicare HMO | Admitting: *Deleted

## 2010-08-14 DIAGNOSIS — Z950 Presence of cardiac pacemaker: Secondary | ICD-10-CM

## 2010-08-14 DIAGNOSIS — R0989 Other specified symptoms and signs involving the circulatory and respiratory systems: Secondary | ICD-10-CM

## 2010-08-14 DIAGNOSIS — I495 Sick sinus syndrome: Secondary | ICD-10-CM

## 2010-08-14 DIAGNOSIS — R079 Chest pain, unspecified: Secondary | ICD-10-CM

## 2010-08-19 ENCOUNTER — Institutional Professional Consult (permissible substitution): Payer: Medicare HMO | Admitting: Cardiovascular Disease

## 2010-08-19 ENCOUNTER — Encounter: Payer: Self-pay | Admitting: Orthopedic Surgery

## 2010-08-19 ENCOUNTER — Ambulatory Visit (INDEPENDENT_AMBULATORY_CARE_PROVIDER_SITE_OTHER): Payer: Medicare HMO | Admitting: Orthopedic Surgery

## 2010-08-19 DIAGNOSIS — M1710 Unilateral primary osteoarthritis, unspecified knee: Secondary | ICD-10-CM

## 2010-08-19 DIAGNOSIS — IMO0002 Reserved for concepts with insufficient information to code with codable children: Secondary | ICD-10-CM

## 2010-08-19 MED ORDER — GABAPENTIN 100 MG PO CAPS: ORAL_CAPSULE | ORAL | Status: DC

## 2010-08-19 MED ORDER — GABAPENTIN 100 MG PO CAPS: 100.0000 mg | ORAL_CAPSULE | Freq: Three times a day (TID) | ORAL | Status: DC

## 2010-08-19 NOTE — Progress Notes (Signed)
Pacer remote check

## 2010-08-19 NOTE — Progress Notes (Signed)
Followup visit for diagnosis osteoarthritis, RIGHT knee and also radicular pain, RIGHT leg.  Patient started on Neurontin 100 mg at night with some relief. Patient was able to put out 20 bales of pine needles, and he's been doing yard work. He still has some knee pain and some knee stiffness with a flexion contracture, but it is, overall activity has been improved on Neurontin. He is sleeping a little bit better with it.  Past Medical History  Diagnosis Date  . Hypertension   . Dyslipidemia   . Atrial fibrillation    Past Surgical History  Procedure Date  . Implantation of a medtronic dual chamber pacemaker 02/01/06    [name] [name] MD  . Left knee arthroscopy with debridement 04/23/2006    [name] M.D  . Left knee total knee  replacement 05/13/07    [name] M.D.    Ros: He had some chest pain, and arm numbness in his going to see a cardiologist. He did have a stress test there was some abnormality, which needs followup  I think at this point, we can increase the medication as he has had no side effects. I would like him to go to 200 mg at night and then 300 mg if he has not felt a change in his symptoms.  Recommend followup in 6 weeks

## 2010-09-09 ENCOUNTER — Encounter: Payer: Self-pay | Admitting: Cardiology

## 2010-09-09 ENCOUNTER — Encounter: Payer: Self-pay | Admitting: *Deleted

## 2010-09-09 ENCOUNTER — Other Ambulatory Visit: Payer: Self-pay | Admitting: *Deleted

## 2010-09-09 ENCOUNTER — Ambulatory Visit (INDEPENDENT_AMBULATORY_CARE_PROVIDER_SITE_OTHER): Payer: Medicare HMO | Admitting: Cardiology

## 2010-09-09 DIAGNOSIS — I4891 Unspecified atrial fibrillation: Secondary | ICD-10-CM

## 2010-09-09 DIAGNOSIS — R0602 Shortness of breath: Secondary | ICD-10-CM

## 2010-09-09 DIAGNOSIS — I10 Essential (primary) hypertension: Secondary | ICD-10-CM

## 2010-09-09 DIAGNOSIS — E785 Hyperlipidemia, unspecified: Secondary | ICD-10-CM

## 2010-09-09 DIAGNOSIS — R9430 Abnormal result of cardiovascular function study, unspecified: Secondary | ICD-10-CM

## 2010-09-09 NOTE — Assessment & Plan Note (Signed)
The blood pressure is at target. No change in medications is indicated. We will continue with therapeutic lifestyle changes (TLC).  

## 2010-09-09 NOTE — Progress Notes (Signed)
HPI The patient presents for evaluation of an abnormal stress test. He has seen Dr. [name] in the past for tachybradycardia syndrome and does have a pacemaker. Recently he was being considered for knee replacement but actually is not going to have this. Because he had complaints of chest discomfort he had a stress perfusion study. I reviewed this and it suggested a perfusion defect in the inferior wall consistent with a fairly large area of ischemia. The EF was 60%.  The patient has no prior history of coronary disease stress testing. He has had burning chest discomfort off and on since January. He had one severe episode but otherwise it has been somewhat moderate. He is relatively sedentary. With his levels of activity he does not get chest pressure, neck or arm discomfort. He does not report any presyncope or syncope. He does still occasionally gets some sporadic chest discomfort that is less severe than the episode mentioned. His discomfort as a substernal burning. He doesn't bring it on with his activities though again he is not overly exerting himself. There is no radiation to his neck or to his arms. He does not report associated nausea vomiting or diaphoresis though he might get somewhat short of breath. He typically goes away spontaneously and he's not sure how long it lasts. He had never had this problem before.  Allergies  Allergen Reactions  . Nsaids     colitis    Current Outpatient Prescriptions  Medication Sig Dispense Refill  . aspirin (ASPIRIN LOW DOSE) 81 MG EC tablet Take 81 mg by mouth daily.        Marland Kitchen atenolol (TENORMIN) 100 MG tablet Take 100 mg by mouth 2 (two) times daily. Take one tablet by mouth two times a day       . gabapentin (NEURONTIN) 100 MG capsule TAKE 2 TABS NIGHTLY FOR 1 WEEK THEN INCREASE TO 3 TABS IF NO CHANGE IN SYMPTOMS  60 capsule  2  . mesalamine (ASACOL) 400 MG EC tablet Take 400 mg by mouth 2 (two) times daily. And also as needed up to 3 times daily      .  Multiple Vitamin (MULTIVITAMIN) capsule Take 1 capsule by mouth daily. 1 by mouth daily         . omeprazole (PRILOSEC) 20 MG capsule Take 20 mg by mouth daily as needed.        . simvastatin (ZOCOR) 80 MG tablet Take 40 mg by mouth at bedtime. Take half tablet by mouth at bedtime daily        . warfarin (COUMADIN) 5 MG tablet Take 5 mg by mouth as directed. Use as directed per Quillian Quince office      . DISCONTD: mesalamine (ASACOL) 400 MG EC tablet Take 400 mg by mouth daily. 3 by mouth once daily         Past Medical History  Diagnosis Date  . Hypertension   . Dyslipidemia   . Atrial fibrillation     Past Surgical History  Procedure Date  . Implantation of a medtronic dual chamber pacemaker 02/01/06    [name] [name] MD  . Left knee arthroscopy with debridement 04/23/2006    [name] M.D  . Left knee total knee  replacement 05/13/07    [name] M.D.    Family History  Problem Relation Age of Onset  . Lung disease Father     Emphysema  . Cancer Mother     History   Social History  .  Marital Status: Married    Spouse Name: N/A    Number of Children: 3  . Years of Education: N/A   Occupational History  .     Social History Main Topics  . Smoking status: Former Smoker -- 1.0 packs/day for 15 years    Types: Cigarettes    Quit date: 05/11/1978  . Smokeless tobacco: Not on file  . Alcohol Use: Not on file  . Drug Use: Not on file  . Sexually Active: Not on file   Other Topics Concern  . Not on file   Social History Narrative  . No narrative on file    ROS:  As stated in the HPI and negative for all other systems.   PHYSICAL EXAM BP 108/70  Pulse 76  Ht 6' 1"  (1.854 m)  Wt 172 lb (78.019 kg)  BMI 22.69 kg/m2 GENERAL:  Well appearing HEENT:  Pupils equal round and reactive, fundi not visualized, oral mucosa unremarkable NECK:  No jugular venous distention, waveform within normal limits, carotid upstroke brisk and symmetric, no bruits, no  thyromegaly LYMPHATICS:  No cervical, inguinal adenopathy LUNGS:  Clear to auscultation bilaterally BACK:  No CVA tenderness CHEST:  Well healed pacer scar. HEART:  PMI not displaced or sustained,S1 and S2 within normal limits, no S3, no S4, no clicks, no rubs, no murmurs, irregular ABD:  Flat, positive bowel sounds normal in frequency in pitch, no bruits, no rebound, no guarding, no midline pulsatile mass, no hepatomegaly, no splenomegaly EXT:  2 plus pulses throughout, no edema, no cyanosis no clubbing SKIN:  No rashes no nodules NEURO:  Cranial nerves II through XII grossly intact, motor grossly intact throughout PSYCH:  Cognitively intact, oriented to person place and time  HDQ:QIWLNL fibrillation, right bundle branch block, demand ventricular pacing  ASSESSMENT AND PLAN

## 2010-09-09 NOTE — Assessment & Plan Note (Signed)
The patient has an abnormal stress test as described.  Given this and his symptoms, cardiac cath is indicated.  I described to him the risks benefits and he agrees to proceed. At this point I don't see a reason for bridging Lovenox as he comes off his Coumadin. In fact there is a February conversation documented from Dr. [name] suggesting he does not need bridging Lovenox. He will stop the Coumadin 5 days prior to the catheterization.

## 2010-09-09 NOTE — Patient Instructions (Signed)
Your physician recommends that you go to the [facility] for lab work/chest x-ray. DO ON Sep 16, 2010. Your physician has requested that you have a cardiac catheterization. Cardiac catheterization is used to diagnose and/or treat various heart conditions. Doctors may recommend this procedure for a number of different reasons. The most common reason is to evaluate chest pain. Chest pain can be a symptom of coronary artery disease (CAD), and cardiac catheterization can show whether plaque is narrowing or blocking your heart's arteries. This procedure is also used to evaluate the valves, as well as measure the blood flow and oxygen levels in different parts of your heart. For further information please visit [email or web address]. Please follow instruction sheet, as given.

## 2010-09-09 NOTE — Assessment & Plan Note (Signed)
He is having trouble with Coumadin levels and would like to switch to Pradaxa and I will arrange this after the catheterization pending his renal function.

## 2010-09-11 ENCOUNTER — Ambulatory Visit: Payer: Medicare HMO | Admitting: Cardiology

## 2010-09-16 ENCOUNTER — Encounter: Payer: Self-pay | Admitting: Cardiology

## 2010-09-16 LAB — PROTIME-INR

## 2010-09-18 ENCOUNTER — Inpatient Hospital Stay (HOSPITAL_BASED_OUTPATIENT_CLINIC_OR_DEPARTMENT_OTHER)
Admission: RE | Admit: 2010-09-18 | Discharge: 2010-09-18 | Disposition: A | Discharge status: Home / Self Care | Payer: Medicare HMO | Source: Ambulatory Visit | Attending: Cardiology | Admitting: Cardiology

## 2010-09-18 DIAGNOSIS — I2510 Atherosclerotic heart disease of native coronary artery without angina pectoris: Secondary | ICD-10-CM | POA: Insufficient documentation

## 2010-09-18 DIAGNOSIS — R9439 Abnormal result of other cardiovascular function study: Secondary | ICD-10-CM | POA: Insufficient documentation

## 2010-09-18 DIAGNOSIS — R079 Chest pain, unspecified: Secondary | ICD-10-CM | POA: Insufficient documentation

## 2010-09-20 HISTORY — PX: CARDIAC CATHETERIZATION: SHX172

## 2010-09-23 NOTE — Assessment & Plan Note (Signed)
[facility]                         ELECTROPHYSIOLOGY OFFICE NOTE   NAME:Fricker, RUGER SAXER                      MRN:          [id number]  DATE:12/02/2006                            DOB:          1934-08-13    Mr. Naas returns today for followup.  He is a very pleasant, 76-year-  old male with history of paroxysmal atrial fibrillation, symptomatic  bradycardia, status post pacemaker insertion and hypertension.  He has  not had much improvement on flecainide therapy and he has stopped it.  He notes that his Coumadin levels have not been particularly well-  controlled.  He does admit to some dietary indiscretion biting green,  leafy vegetables, particularly small amounts of iceberg lettuce.   PHYSICAL EXAMINATION:  GENERAL:  He is a pleasant, well-appearing man in  no distress.  VITAL SIGNS:  Blood pressure 112/62, pulse 76 and regular, respirations  18.  CARDIAC:  Regular rate and rhythm with normal S1, S2.  There are no  murmurs, rubs or gallops.  LUNGS:  Clear bilaterally to auscultation.  No wheezes, rales or  rhonchi.  EXTREMITIES:  No clubbing, cyanosis or edema.   Interrogation of his pacemaker demonstrates a Medtronic Banks with P  waves greater than 5 and R waves of 8.  Impedance 562 in the atrium, 470  in the ventricle, threshold 0.5 and 0.4 in the right atrium and 0.75 and  0.4 in the right ventricle.  The battery voltage is 2.78 V. Demonstrated  paroxysmal atrial fibrillation with an average atrial rate of 180 beats  per minute.  The patient was programmed DDDR at 300 msec AV delay.   IMPRESSION:  1. Symptomatic bradycardia.  2. Paroxysmal atrial fibrillation.  3. Status post pacemaker.  4. Hypertension.  5. Chronic Coumadin therapy.   DISCUSSION:  Mr. Roethler pacemaker is working normally today and his  atrial fibrillation is reasonably well-controlled.  He is out of rhythm  17% of the time, but he has minimal symptoms with this.   I have asked  that he undergo a period of watchful waiting and continue his medical  therapy.  He is no longer on flecainide as he did not notice any  improvement, but is on his beta-blocker and other antihypotensive drugs.  He will continue on Coumadin for now.  Will plan to see him back in the  office in 1 year.     [name] [name], MD  Electronically Signed   GWT/MedQ  DD: 12/02/2006  DT: 12/03/2006  Job #: [id number]   cc:   Gar Ponto

## 2010-09-23 NOTE — Discharge Summary (Signed)
NAMECHIRSTOPHER, IOVINO               ACCOUNT NO.:  [id number]   MEDICAL RECORD NO.:  [id number]          PATIENT TYPE:  INP   LOCATION:  5028                         FACILITY:  [place]   PHYSICIAN:  Quron Ruddy. Veverly Fells, M.D. DATE OF BIRTH:  [birth year]   DATE OF ADMISSION:  05/13/2007  DATE OF DISCHARGE:  05/16/2007                               DISCHARGE SUMMARY   ADMISSION DIAGNOSIS:  Left end-stage osteoarthritis of his knee.   DISCHARGE DIAGNOSIS:  Left end-stage osteoarthritis of his knee.   BRIEF HISTORY:  The patient is a 76 year old male with worsening left  knee osteoarthritis.  The patient has elected to have a left total knee  arthroplasty by Dr. Esmond Plants.   PROCEDURE:  The patient had a left total knee arthroplasty using the  DePuy Sigma [place] by Dr. Esmond Plants on May 13, 2007.  Assistant was [employer], PA-C.  General anesthesia was used.  Estimated blood loss was minimal.  No complications.   HOSPITAL COURSE:  The patient was admitted on May 13, 2007, for the  above-stated procedure.  After adequate time in the post anesthesia care  unit, he was transferred up to 5000.  Post-op day #1:  The patient  complaining about some mild pain to the left knee but, otherwise, doing  okay.  Was able to work with physical therapy and also a CPM, and the  dressing was kept intact.  Neurovascularly, he was intact.  Post-op day  #2 and #3:  He had dressing changed.  No signs of cellulitis, erythema,  or infection.  He was able to work gently with physical therapy and able  to ambulate quite well with walker.  Thus, the patient can be discharged  home on May 16, 2007.  The patient will be discharged home.   DISCHARGE PLANNING:  The patient will be discharged home on May 16, 2007.   ALLERGIES:  The patient has no known drug allergies.   DISCHARGE MEDICATIONS:  1. Aspirin 81 mg p.o. daily.  2. Tenormin 100 mg p.o. b.i.d.  3. Colace 100 mg p.o.  b.i.d.  4. Ferrous sulfate 325 mg p.o. t.i.d.  5. Mesalamine 800 mg p.o. t.i.d.  6. Robaxin 500 mg p.o. q.6 hours.  7. Coumadin per pharmacy protocol.  8. Percocet 1-2 tablets q.4-6 hours p.r.n. pain, and that is 5/325.  9. Ambien 5 mg p.o. q.h.s. p.r.n.   CONDITION:  Stable.   DIET:  Regular.   FOLLOW-UP:  The patient can follow back up with Dr. Esmond Plants in  about 2 weeks.      Thomas B. Doren Custard, P.A.      [name], M.D.  Electronically Signed    TBD/MEDQ  D:  05/16/2007  T:  05/16/2007  Job:  [id number]

## 2010-09-23 NOTE — Op Note (Signed)
NAMEHEBERTO, STURDEVANT               ACCOUNT NO.:  [id number]   MEDICAL RECORD NO.:  [id number]          PATIENT TYPE:  INP   LOCATION:  2550                         FACILITY:  [place]   PHYSICIAN:  Juel Bellerose. Veverly Fells, M.D. DATE OF BIRTH:  [birth year]   DATE OF PROCEDURE:  05/13/2007  DATE OF DISCHARGE:                               OPERATIVE REPORT   PREOPERATIVE DIAGNOSIS:  Left knee end-stage osteoarthritis.   POSTOPERATIVE DIAGNOSIS:  Left knee end-stage osteoarthritis.   OPERATION PERFORMED:  Left knee, total knee replacement.   SURGEON:  Tyriq Moragne. Veverly Fells, M.D.   ASSISTANT:  Abbott Pao. Doren Custard, P.A.   ANESTHESIA:  General plus femoral block anesthesia.   ESTIMATED BLOOD LOSS:  Minimal.   TOURNIQUET TIME:  1-1/2 hours at 300 mmHg.   FLUIDS REPLACED:  1400 mL crystalloid.   URINE OUTPUT:  100 mL.   INDICATIONS FOR PROCEDURE:  The patient is a 76 year old male with a  history of end-stage osteoarthritis to the left knee.  The patient has  bone-on-bone on plain standing radiographs.  The patient now has  disabling knee pain and loss of function secondary to his end-stage  osteoarthritis and desires operative arthroplasty.   DESCRIPTION OF PROCEDURE:  After an adequate level of anesthesia was  achieved, the patient was positioned supine on the operating room table.  Nonsterile tourniquet was placed on the left proximal knee.  There was a  flexion contracture of about 5 degrees present.  Went in and sterilely  prepped and draped the knee in the usual manner.  We exsanguinated the  limb using Esmarch bandage and elevated the tourniquet to 300 mmHg.  A  longitudinal midline incision was created with the knee in flexion.  Dissection carried sharply down through subcutaneous tissues using a  Bovie.  Performed a medial parapatellar arthrotomy, dividing the lateral  parapatellar femoral ligaments and everting the patella.  Meniscus  tissue was removed.  ACL and PCL were removed.  The  distal femur was  entered using a step cut drill and then distal femoral resection of 11  mm performed with an oscillating saw secondary to the patient's flexion  contracture.  We then incised the femur, it was a little bit bigger than  size 5 but we felt that a 6 would be too big.  Went ahead and placed the  distal femoral cutting block on set for size 5 anterior reference and  made our anterior, posterior and chamfer cuts.  We paid attention to the  epicondylar axis for our rotation of the femoral component.  We were  careful not to notch the femur and indeed did not.  At this point we  went to the tibia.  We subluxed the tibia anteriorly and performed a 90  degree cut perpendicular to the long axis of the tibia with minimal  posterior slope.  We removed about 2 mm off the affected medial side.  At this point we went ahead and checked our flexion extension gaps which  were easily 12 mm each side with nice balance between the flexion and  extension.  We then  finished our tibial preparation sizing it to a size  4 and then making our keel punch and placing the trial prosthesis in.  We then resected the bone off the femur for the posterior cruciate  substituting prosthesis and performed a box cut using appropriate  instruments.  At this point with the femoral trial component and tibial  trial component, we placed a 15 mm insert into the knee and the knee  attained full extension, nice balance in flexion extension.  We then  resurfaced the patella free hand technique starting with about a 30 mm  thickness.  We took it down to about 16 and then placed our 38 patella  on.  We had nice patella tracking with no hand technique.  We removed  all trial components, thoroughly irrigated.  We did remove fabella from  the back of the knee that seemed to be very large and did not want that  to restrict knee flexion and that was taken out.  We also removed  osteophytes off the posterior aspect of the  femur which were fairly  small but we did remove those.  We plugged the end of the femur with  available bone graft and then went ahead and vacuum mixed our cement.  We thoroughly irrigated the knee and then using hand pressurization  technique we went ahead and cemented the components into place, size 4  tibia.  This was a Physicist, medical prosthesis.  We did a  size 5 femur and placed a 15 spacer block and placed the knee in full  extension.  We removed excess cement using a Soil scientist once had  cemented the patella as well.  Once the cement had dried, we retrialed  with a 15 and a 17.5.  The 17.5 did achieve full extension and good  balance.  We went ahead and went with that and at this point we closed  the knee.  It was nice and dry.  We closed with interrupted TS #1 suture  for the parapatellar arthrotomy and then 2-0 Vicryl subcutaneous closure  and 4-0 Monocryl for skin.  Steri-Strips applied followed by sterile  dressing.  The patient tolerated surgery well.      [name], M.D.  Electronically Signed     SRN/MEDQ  D:  05/13/2007  T:  05/13/2007  Job:  [id number]

## 2010-09-23 NOTE — Assessment & Plan Note (Signed)
[name] OFFICE NOTE   NAME:[name], [name]                      MRN:          [id number]  DATE:11/14/2007                            DOB:          [birth year]    HISTORY:  [name] returns today for followup after over a year  absence from our [facility].  [name] is a very pleasant 76 year old male with  a history of paroxysmal AFib and sinus bradycardia, who is status post  pacemaker insertion.  [name] returns today for followup.  The patient does  continue to have paroxysms of atrial fibrillation.  [name] does have  occasional lightheadedness, but otherwise, no specific complaints except  for occasional palpitations.  [name] denies chest pain or shortness of  breath.   MEDICATIONS:  1. Atenolol 100 twice a day.  2. Aspirin 81 a day.  3. Multiple vitamin.  4. Coumadin as directed.  5. Simvastatin 80 mg half-tablet daily.  6. Asacol 400 two tablets 3 times a day.  7. Doxycycline 100 mg daily.   PHYSICAL EXAMINATION:  GENERAL:  [name] is a pleasant, well-appearing man in  no acute distress.  VITAL SIGNS:  Blood pressure today was 117/70, pulse was 70 and  irregular, respirations were 18, and weight was 186 pounds.  NECK:  No jugular venous distention.  LUNGS:  Clear bilaterally to auscultation.  No wheezes, rales, or  rhonchi are present.  CARDIAC:  Irregular rhythm with normal S1 and S2.  ABDOMEN:  Soft, nontender, and nondistended.  No organomegaly.  EXTREMITIES:  No edema.   Interrogation of his pacemaker demonstrates a [place].  Fib  waves were 1 mV.  The impedance was 505 ohms.  The lead impedance was  472 in the ventricle.  The threshold was 0.4.  The battery voltage was  2.78 volts.  [name] was 87% A-paced.  [name] was in AFib approximately 18% of  the time.   IMPRESSION:  1. Paroxysmal atrial fibrillation.  2. Symptomatic bradycardia.  3. Status post pacemaker insertion.   DISCUSSION:  Overall, Mr.  Frank is stable and his pacemaker is working  normally.  We talked about uptitration of medical therapy or  downtitration of his therapy, but I think presently [name] is on  just the right amount of medical therapy for his AFib and bradycardia.  We will plan to see the patient back in the office.     [name] [name], MD  Electronically Signed    GWT/MedQ  DD: 11/14/2007  DT: 11/15/2007  Job #: [id number]

## 2010-09-26 NOTE — Letter (Signed)
February 03, 2006     Gar Ponto, MD  [address], [address]  [place], [place] [address]   RE:  [name], [name]  MRN:  [id number]  /  DOB:  08/01/1934   Dear Coralyn Mark:   Today, we saw [name] back in our EP Clinic for follow up.  As you  know, he is a very pleasant 76 year old man who has a history of atrial  fibrillation, hypertension and dyslipidemia.  He had been seen in the past  by Dr. Dannielle Burn and was placed on Flecainide in conjunction with calcium  channel blockers and warfarin.  Because of documented bradycardia with heart  rates in the 30's and positive over 3 seconds, all in the setting of atrial  fibrillation with rapid ventricular response at rates of over 150 beats per  minute.  He was referred two days ago for pacemaker implantation.  He  underwent implantation of a Medtronic Sensia dual chamber device without  particular difficulty.  He called our office today and said that he felt  shaky inside and had palpitations and came in and was subsequently found to  be in atrial fibrillation with a rapid ventricular response.  The patient  does not have any heart failure symptoms on examination, and otherwise, is  stable with no evidence of any problems with his pacemaker leads.  His exam  is notable for a small hematoma over the pacemaker insertion site.   I have discussed all these issues with the patient, and I have asked that he  increase he flecainide from 100 mg b.i.d. to 150 mg b.i.d.  I will have him  come back to our office for a trial flecainide level in about a week.  Ultimately, he may require discontinuation of his calcium channel blocker  and initiation of the beta blocker to go along with his flecainide.  I plan  to see him back in the next couple       of weeks to see how he is doing.  Please do not hesitate to contact me for  any questions or problems regarding [name].    Sincerely,      [name] [name], MD     GWT/MedQ  DD:   02/03/2006  DT:  02/05/2006  Job #:  [id number]

## 2010-09-26 NOTE — Assessment & Plan Note (Signed)
[facility]                         ELECTROPHYSIOLOGY OFFICE NOTE   NAME:[name], William MABEE                      MRN:          [id number]  DATE:05/25/2006                            DOB:          06-01-1934    [name] returns today for followup.  He is a patient of Dr. Arlina Robes,  who has a history tachy-brady syndrome with near syncope secondary to  long pauses, who is status post pacemaker insertion with a Medtronic  SENSA placed back in September.  His P-waves are 2, his R-waves are 5,  his impedence was 546 in the atrium, 522 in the ventricle.  Today he was  in atrial fibrillation, and his pacing threshold was 0.75 at .4 in the  right ventricle.  Battery voltage was 2.78 volts.  His outputs were  turned down to 2.5 at .4 in the ventricle today.  His ________ is out.  The patient has continued to be bothered by some paroxysms of  fibrillation and his histogram today demonstrates that he is in fact out  of rhythm approximately 20% of the time and approximately 10% of the  time his ventricular rate is over 100 beats per minute, suggesting his  that his atrial fibrillation is not particularly well controlled.  This  is despite increasing doses of Atenolol.  The patient is apparently only  able to tolerate 50 mg twice daily of the Flecainide.  He denies syncope  and in the interim he has also undergone knee surgery.   On exam, he is a pleasant 76 year old man who looks younger than stated  age and in no acute distress.  The blood pressure is 121/90, the pulse  was 78 and irregularly irregular.  Respirations were 18, weight was 189  pounds.  NECK:  No jugular venous distention.  LUNGS:  Clear bilaterally to auscultation.  CARDIOVASCULAR:  An irregular irregular rhythm with normal S1 and S2.  EXTREMITIES:  No edema.   Interrogation of his pacemaker is as previously noted.   IMPRESSION:  1. Paroxysmal atrial fibrillation.  2. Symptomatic  tachy-brady.  3. Status post pacemaker insertion.   DISCUSSION:  I have recommended that [name] continue a period of  watchful waiting.  He may ultimately require discontinuation of  Flecainide and additional anti-arrhythmic drug therapy perhaps with  Rythmol or Tikosyn or even amiodarone for control of his atrial  fibrillation.  I will plan to see him back in the office in 3 to 4  months and we can at that time see how much atrial fibrillation he has  had and make additional recommendations based on how he is doing.     [name] [name], MD  Electronically Signed   GWT/MedQ  DD: 05/25/2006  DT: 05/25/2006  Job #: [id number]   cc:   Ernestine Mcmurray, MD,FACC

## 2010-09-26 NOTE — Assessment & Plan Note (Signed)
[place] OFFICE NOTE   NAME:[name], [name]                      MRN:          [id number]  DATE:02/18/2006                            DOB:          09-11-1934    REFERRING PHYSICIAN:  Dr. Cristopher Peru.   HISTORY OF PRESENT ILLNESS:  The patient is a 76 year old male with a  history of tachybrady syndrome.  The patient is status post recent pacemaker  implantation.  He was seen in the office on February 12, 2006, due to  complaints of significant dizziness and tremors as well as constipation.  I  felt that the patient was having significant side effects to flecainide  which was recently increased.  The patient also personally wanted to go back  from metoprolol to atenolol.  We have implemented all of these changes.  I  have put the patient on atenolol 100 mg a day, stopped his metoprolol ER,  also stopped his lisinopril as he had some evidence of orthostasis.   The patient now presents for a followup.  He states that he has markedly  improved.  He has had no real dizziness or tremors.  He feels that the side  effects of flecainide are slowly resolving.  He kept a log of his blood  pressures which have been within normal range short of the diastolic which  has occasionally been jumping in the 89 to 87 mmHg range.  His INR level  today is 1.5 and has been adjusted.  He is not orthostatic in the clinic  today.  He denies any chest pain, shortness of breath, orthopnea, or PND.   MEDICATIONS:  1. Atenolol 100 mg a day.  2. Coumadin as directed.  3. Flecainide 50 mg p.o. b.i.d.  4. Mesalamine.  5. Simvastatin 80 mg 1/2 a tablet p.o. nightly.   PHYSICAL EXAMINATION:  VITAL SIGNS:  Blood pressure is 118/70 and lying and  124/72 standing.  Heart rate is 69 beats per minute.  The patient is in  atrial paced rhythm.  NECK:  Normal carotid upstroke.  No carotid bruits.  LUNGS:  Clear breath sounds  bilaterally.  HEART:  Regular rate and rhythm.  Normal S1, S2.  No murmurs, rubs, or  gallops.  ABDOMEN:  Soft.  EXTREMITIES:  No cyanosis, clubbing, or edema.   IMPRESSION:  1. Paroxysmal atrial fibrillation (atrial pacing when intrinsic      ventricular rhythm).      a.     Flecainide therapy decreased.      b.     Side effects secondary to Flecainide resolving.  2. Tachybrady syndrome status post permanent pacemaker implantation (Dual      Chamber).  3. History of syncope, no recurrence.  4. Anticoagulation secondary to atrial fibrillation.  5. Orthostasis, resolved (lisinopril discontinued).  6. Diastolic hypertension.   PLAN:  1. The patient has markedly improved.  He has no further orthostasis      symptoms.  Also, his side effects of dizziness and tremors appears to      be resolving on lower dose flecainide.  2. I will continue flecainide at 50 b.i.d.  Also continue with atenolol at      the current dose.  3. The patient's slight increase in his diastolic blood pressures and we      will give it several more weeks of washout of his current flecainide      dose.  If the patient is back to baseline, then we will add low-dose      lisinopril to control blood pressure.  If he has side effects, then we      will understand what secondary to what medication has occurred.  4. I have adjusted Coumadin in the clinic today.  His EKG demonstrates      atrial pacing and intrinsic ventricular rhythm.  The patient will be      seen back in 1 month.  I have given the go ahead also for him to have a      flu shot.       Ernestine Mcmurray, MD,FACC     GED/MedQ  DD:  02/19/2006  DT:  02/20/2006  Job #:  [id number]   cc:   [name] [name], MD

## 2010-09-26 NOTE — Assessment & Plan Note (Signed)
[facility] [place]                                   ON-CALL NOTE   NAME:[name], [name]                      MRN:          [id number]  DATE:01/09/2006                            DOB:          [birth year]    TELEPHONE CALL:  I received a call through the answering service from  [place] from a person by the name of [place].  I returned the call at [phone] concerning a CardioNet monitor that had been placed on [name], date of birth of  01-Jan-1935.  I believe he is a patient of Dr.  Arlina Robes.  I received a call from [place] at [facility] concerning rapid atrial  fibrillation on the above stated gentleman.  I called the patient at home  [phone].  I spoke with Mr. Rosencrans. He stated that he was not aware that his  heart beat was rapid and irregular.  He was spiking at 130, 140 at times.  He stated he felt a little nervous and shaky inside.  He denied any  shortness of breath, denied any episodes of chest discomfort, presyncope or  syncopal episodes.  I informed him if he experienced any of these symptoms  he needed to be seen to seek medical assistance.  He stated that he would be  evaluated at the closest medical facility if he experienced any of these  problems.                                   Rosanne Sack, ACNP   MB/MedQ  DD:  01/10/2006  DT:  01/11/2006  Job #:  [id number]

## 2010-09-26 NOTE — Assessment & Plan Note (Signed)
[facility]                          [facility] OFFICE NOTE   NAME:William, JONH [name]                      MRN:          [id number]  DATE:04/21/2006                            DOB:          1934/10/21    HISTORY OF PRESENT ILLNESS:  The patient is a 76 year old male with  history of tachy-brady syndrome. The patient is status post pacemaker  implantation. The patient has been on Flecainide for many times. He has  been unable to tolerate higher dosing of Flecainide. He is now  tolerating 50 b.i.d. He also has occasional palpitations and has  increased on his Atenolol dosing. He has now done quite well. He states  that he has a very rare palpitation. He is scheduled for knee surgery on  Monday. Coumadin has been on hold as well as aspirin.   MEDICATIONS:  Atenolol 150 mg daily, Colchicine 0.6 daily, aspirin in  hold, multivitamin, Coumadin as directed (put on hold), Simvastatin 80  mg 1/2 tablet p.o. q.h.s., Flecainide 50 mg p.o. daily.   PHYSICAL EXAMINATION:  VITAL SIGNS:  Blood pressure 120/86, heart rate  65 beats per minute. Weight stable.  NECK:  No carotid bruits.  LUNGS:  Clear.  HEART:  Regular rate and rhythm. Normal S1 and S2.  ABDOMEN:  Soft.  EXTREMITIES:  No clubbing, cyanosis, or edema.   PROBLEM LIST:  1. Paroxysmal atrial fibrillation.      a.     AV sequential pacing.      b.     Low dose Flecainide.  2. Tachy-brady syndrome, status post pacemaker implantation with dual      chamber device.  3. History of syncope, no recurrence.  4. Anticoagulation, on hold.  5. Knee surgery scheduled for Monday.   PLAN:  1. The patient is doing quite well. I have increased his Atenolol,      however, to 100 mg p.o. b.i.d.  2. Flecainide dose can remain the same.  3. The patient can resume aspirin and Coumadin after his knee surgery.     Ernestine Mcmurray, MD,FACC  Electronically Signed    GED/MedQ  DD: 04/21/2006  DT: 04/21/2006  Job  #: [id number]

## 2010-09-26 NOTE — Assessment & Plan Note (Signed)
 [facility]ALTH[facility]                           ELECTROPHYSIOLOGY OFFICE NOTE   NAME:[name], [name]                      MRN:          [id number]  DATE:02/15/2006                            DOB:          11-Jun-1934    Mr. Byas was seen today in the clinic on February 15, 2006 for a wound check  of his newly implanted Medtronic model #[id number] [place]. Date of implant was  February 01, 2006 for tachybrady syndrome. On interrogation of his device  today, his battery voltage was 2.78. PV waves measured 1.0 to 1.4 millivolts  with an atrial lead impedence of 507 ohms. Atrial capture threshold was not  done. He is in atrial fibrillation, and it appears to be ongoing since  implant date. R waves measured 4.0 to 5.6 millivolts with a ventricular  pacing threshold of 0.5 volts at 0.4 milliseconds and a ventricular lead  impedence of 513 ohms. No changes were made in his parameters today, and  Steri-Strips were removed. There was a resolving hematoma noted, and he will  be seen again in 3 months' time.      ______________________________  Alma Friendly, LPN    ______________________________  [name] [name], MD    PO/MedQ  DD:  02/15/2006  DT:  02/16/2006  Job #:  [id number]

## 2010-09-26 NOTE — Assessment & Plan Note (Signed)
[facility]                         ELECTROPHYSIOLOGY OFFICE NOTE   NAME:[name], [name]                      MRN:          [id number]  DATE:08/18/2006                            DOB:          1935-01-06    [name] returns today for followup.  [name] is a very pleasant 76-year-  old male with a history of paroxysmal atrial fibrillation and sinus  bradycardia, status post pacemaker implantation, who returns today for  followup.  I saw [name] back in January.  [name] was in atrial fibrillation  about 23% of the time.  Today [name] returns for followup.  [name] notes that [name]  is overall doing relatively well.  [name] does have palpitations.   PHYSICAL EXAMINATION:  On exam, [name] is a pleasant 76 year old man in no  distress.  Blood pressure 145/90, the pulse 63 and regular, respirations  are 18, weight was 182 pounds.  NECK:  Revealed no jugular venous distention.  LUNGS:  Clear bilaterally.  CARDIOVASCULAR:  Regular rate and rhythm with normal S1 and S2.  EXTREMITIES:  No edema.   MEDICATIONS:  1. Atenolol 100 twice a day.  2. Aspirin.  3. Multivitamin.  4. Coumadin.  5. Simvastatin.  6. Flecainide 50 daily.  7. Prednisone.  8. Asacol.  Note:  [name] takes the Asacol and the prednisone for some      colitis.   Interrogation of his pacemaker:  As noted [name] was A-paced 92%  of the  time and [name] had 20% of atrial fibrillation.   IMPRESSION:  1. Symptomatic  bradycardia.  2. Paroxysmal atrial fibrillation.  3. Flecainide therapy.  4. Coumadin therapy.   DISCUSSION:  Overall Mr. Robinson is stable but I think [name] might do better  off of all Flecainide.  Ultimately if [name] develops worsening atrial  fibrillation we could consider low-dose Amiodarone for maintenance of  sinus rhythm.  I plan to see [name] back in several months.     [name] [name], MD  Electronically Signed    GWT/MedQ  DD: 08/18/2006  DT: 08/18/2006  Job #: [id number]   cc:   Gar Ponto

## 2010-09-26 NOTE — Op Note (Signed)
[name], [name]               ACCOUNT NO.:  [id number]   MEDICAL RECORD NO.:  [id number]          PATIENT TYPE:  OIB   LOCATION:  2853                         FACILITY:  [facility]   PHYSICIAN:  [name] [name], MD    DATE OF BIRTH:  June 11, 1934   DATE OF PROCEDURE:  02/01/2006  DATE OF DISCHARGE:                                 OPERATIVE REPORT   PROCEDURE PERFORMED:  Implantation of dual-chamber pacemaker.   INDICATIONS:  Symptomatic tachy-brady syndrome with paroxysmal atrial fib.   INTRODUCTION:  The patient is a 76 year old man who is in the initially seen  by me in the clinic for evaluation and treatment of atrial fibrillation.  He  would have pauses and dizzy spells and was subsequently found to have pauses  of over 3 seconds on monitor and at the same time concomitant periods of  rapid atrial fibrillation.  He is now referred for permanent pacemaker  implantation.   PROCEDURE:  After informed consent was obtained, the patient is taken to the  diagnostic EP lab in fasting state.  After usual preparation and draping,  intravenous fentanyl and midazolam was given for sedation.  30 mL lidocaine  was infiltrated in the left infraclavicular region.  A 5 cm incision was  carried out over this region.  Electrocautery utilized to dissect down to  the fascial plane.  A combination of blunt dissection was utilized to  isolate the cephalic vein.  Iris scissors were utilized to enter the vein  and a vein pick utilized to open the vein.  The Medtronic model 5076 58-cm  active fixation pacing lead serial number [id number] was inserted into the  left cephalic vein and the Medtronic model 5076 52-cm active fixation pacing  lead serial number [id number] was also inserted into the left cephalic vein.  These leads were advanced into the right ventricle and right atrium  respectively.  Mapping was subsequently carried out in the right ventricle  at the final site on the RV apical septum and the  R-waves measured 12 and  the pacing impedance was 849 ohms with a pacing threshold 0.6 volts at 0.5  milliseconds.  10 volts pacing did not stimulate the diaphragm.  With the  ventricular lead in satisfactory position, attention then turned to  placement of the atrial lead which was placed in right atrial appendage  where P-waves measured 4 mV and the pacing impedance was 606 ohms.  The  pacing threshold 1.1 volts at 0.5 milliseconds.  10 volts pacing did not  stimulate diaphragm.  With both atrial and ventricular leads in satisfactory  position, they were secured to subpectoralis fascia with a figure-of-eight  silk suture.  The sewing sleeve was also secured with silk suture.  Electrocautery utilized to make subcutaneous pocket.  Kanamycin irrigation  was utilized to irrigate the pocket and electrocautery utilized to assure  hemostasis.  The Medtronic Sensa [id number] dual-chamber pacemaker serial number  [id number] was connected to the atrial and ventricular pacing leads and  placed in the subcutaneous pocket.  Generator secured with silk suture.  Additional kanamycin utilized to irrigate the  pocket.  The incision was  closed with a layer of 2-0 Vicryl followed by layer of 3-0 Vicryl followed  by layer of 4-0 Vicryl.  Benzoin was painted on skin, Steri-Strips were  applied and pressure dressing was placed.  The patient was returned to his  room in satisfactory condition.   COMPLICATIONS:  There were no immediate procedure complications.   RESULTS:  Demonstrates successful implantation of a Medtronic dual chamber  pacemaker by way of the left cephalic vein without immediate procedure  complication.           ______________________________  [name] [name], MD     GWT/MEDQ  D:  02/01/2006  T:  02/03/2006  Job:  [id number]   cc:   Doyle Askew, MD,FACC

## 2010-09-26 NOTE — Discharge Summary (Signed)
NAMESIMUEL, [name]               ACCOUNT NO.:  [id number]   MEDICAL RECORD NO.:  [id number]          PATIENT TYPE:  OIB   LOCATION:  6525                         FACILITY:  [facility]   PHYSICIAN:  William Mungo. [name], MD    DATE OF BIRTH:  [birth year]   DATE OF ADMISSION:  02/01/2006  DATE OF DISCHARGE:  02/02/2006                                 DISCHARGE SUMMARY   PRINCIPAL DIAGNOSES:  1. Discharging day one status post implantation of Medtronic Sensia dual-      chamber pacemaker.  2. History of severe bradycardia with up to 3 seconds pauses /palpitation      and presyncope as symptoms.  3. Tachybrady syndrome, pacemaker implanted.  4. Paroxysmal atrial fibrillation on chronic Coumadin.  5. Abnormal chest x-ray this admission with the finding of 12.5 mm lung      mass on the right with recommended follow-up CT scan.  He is not      previously worked up.   SECONDARY DIAGNOSES:  1. History of asbestosis followed by the [facility].      a.     Last chest x-ray at [facility] December 24, 2005:       This study shows pleural thickening which is unchanged.  The patient       has hyperinflation but no fibronodular interstitial pattern.  He has       pleural plaquing right greater than left.  He had a history of remote       asbestos exposure without radiographic evidence of pulmonary       asbestosis.  The film on December 24, 2005 was unchanged from recent       film December 25, 2004.  Note we will fax our chest x-ray and its report       to the [facility].  2. The patient is currently on flecainide/diltiazem therapy.  3. Hypertension.  4. Dyslipidemia.  5. Stable ulcerative colitis.   PROCEDURE:  February 01, 2006, implantation of Medtronic 70 Patten dual-  chamber pacemaker for tachybrady syndrome, Dr. Cristopher [name].  The patient  has had no postprocedural complications.  Incision is healing nicely without  hematoma.  The patient is paced at 60  beats per minute.   BRIEF HISTORY:  [name] is a 75 year old male.  He has had a history of  recurrent episodes of presyncope.  He has a history of paroxysmal atrial  fibrillation.  There is some concern that the patient is having some  bradycardia with corresponding near-syncopal episodes.  The patient has been  wearing a cardiac monitor.  The study demonstrated severe bradycardia with  heart rates in the 30s and pauses up to 3 seconds.  The patient has also  demonstrated by monitor rapid atrial fibrillation with heart rates in the  160s.  The patient is referred for discussion about implantation of  permanent pacemaker.  Of note, the patient is being followed closely by  [facility] for history of occupational asbestosis.   HOSPITAL COURSE:  The patient presented electively on Monday, February 01, 2006.  He underwent implantation of a Medtronic Sensia dual-chamber  pacemaker without complication.  Of note, chest x-ray showed a 12.5 mm lung  mass on the right periphery.  This information has been communicated to  [facility] as well as a copy our x-ray finding.  The patient  is asked not to drive for the next week, not to lift anything more than 10  pounds for the next 4 weeks.  He is to keep his incision dry for next 7 days  and to sponge bathe until Monday, February 08, 2006.  Mobility of the left arm  has been discussed with the patient.   DISCHARGE MEDICATIONS:  1. Coumadin 5 mg every other day and 1-1/2 tablet or 7.5 mg every other      day.  2. Diltiazem ER 240 mg daily.  3. Simvastatin 40 mg daily at bedtime.  4. Asacol 400 mg daily.  5. Lisinopril 20 mg daily.  6. Multivitamin daily.  7. Enteric-coated aspirin 81 mg daily.  8. Flecainide 100 mg twice daily.  There is some thought that the patient      could restart atenolol per Dr. Olena Heckle.  [name] discussed this with      the patient and there is no contraindication to this since the  patient      currently has pacer back up.   Once again the last appointment chest x-ray with [facility]  was December 24, 2005.  The last CT scan was June 26, 2003.  He has follow-  up with [place], [place], [place].   Pacer clinic Monday, February 15, 2006 at 9:20.  He will see [name]  Tuesday, May 25, 2005 at 11:10 in the morning.   PERTINENT LABORATORY STUDIES THIS ADMISSION:  Complete blood count white  cells 9.5, hemoglobin 14.1, hematocrit 41.8, platelets 222.  PTT is 36.4,  the pro time is 19.2, INR 2.3.  Serum electrolytes:  Sodium 143, potassium  4.5, chloride 108, carbonate 26, glucose 78, BUN is 14, creatinine 1.3.  Once again 45 minutes for examine.     ______________________________  William Margarita, PA    ______________________________  William Mungo. [name], MD    GM/MEDQ  D:  02/02/2006  T:  02/04/2006  Job:  [id number]   cc:   William Mungo. [name], MD  [name], [facility]  William Davina Poke, MD

## 2010-09-26 NOTE — Assessment & Plan Note (Signed)
[facility] OFFICE NOTE   NAME:[name], [name]                      MRN:          [id number]  DATE:02/12/2006                            DOB:          [birth year]    HISTORY OF PRESENT ILLNESS:  The patient is a 76 year old male with a  history of tachybrady syndrome, status post recent pacemaker implantation.  The patient has a history of paroxysmal atrial fibrillation.  Initially he  was started on flecainide  After initiation of flecainide, he started  complaining of symptoms of dizziness and tremors as well as constipation.  After his pacemaker was implanted, the dose of flecainide was increased  further to 150 mg b.i.d. due to breakthrough episodes of symptomatic  paroxysmal atrial fibrillation.  Unfortunately, after this change, the  patient started having even more side effects and called me yesterday.  He  brought in his daughter today and they were very concerned about the change  in flecainide and felt that his symptoms were clearly correlated to a  gradual changing of the dosing. He is also severely constipated.  We have  discussed this at length in the office today and I agree with his concerns  and suspect that the flecainide indeed is contributing to the significant  side effects.   CURRENT MEDICATIONS:  1. Metoprolol ER 50 mg p.o. daily.  2. Lisinopril 20 mg a day.  3. Coumadin 5 mg a day.  4. Flecainide 150 mg b.i.d.  5. Mesalamine.  6. Simvastatin 80 mg half a tablet p.o. at night.   PHYSICAL EXAMINATION:  VITAL SIGNS:  Blood pressure 124/80 lying, 92/69  standing.  Heart rate is 67 beats per minute.  NECK:  Normal carotids and no carotid bruits.  LUNGS:  Clear breath sounds bilaterally.  HEART:  Regular rate and rhythm.  Normal S1 and S2.  No murmurs, rubs or  gallops.  ABDOMEN:  Soft.  EXTREMITIES:  No cyanosis, clubbing or edema.   PROBLEMS:  1. Paroxysmal atrial  fibrillation.      a.     Flecainide therapy.      b.     Side effects secondary to flecainide.  .  2. Tachybrady syndrome, status post permanent pacemaker implantation.  3. History of syncope.  4. Anticoagulation secondary to atrial fibrillation, subtherapeutic INR      1.1 when last seen.  5. Orthostasis.  See blood pressures above.   PLAN:  1. I have told the patient I will cut back his flecainide to 50 b.i.d.  He      may want to come off the drug altogether due to side effects.  2. We will discontinue Metoprolol ER as the patient prefers atenolol.  I      have put him on atenolol 100 mg a day which may need to be further      increased.  3. I have also held his lisinopril today as his blood pressure is good and      this may be additionally contributing to his orthostasis although we  may need to restart this later on.  4. Diltiazem has been discontinued altogether as previously done by Dr.      [name].  I will see the patient back in one week.  If he is feeling      much better, we can continue low-dose flecainide and high dose beta      blocker.  If he continues to have side effects, we will get discontinue      flecainide altogether and manage him with digoxin and high-dose beta      blockers.       Ernestine Mcmurray, MD,FACC     GED/MedQ  DD:  02/12/2006  DT:  02/14/2006  Job #:  [id number]   cc:   Cristopher Peru, MD

## 2010-09-26 NOTE — Assessment & Plan Note (Signed)
[facility]                           ELECTROPHYSIOLOGY OFFICE NOTE   NAME:[name], [name]                      MRN:          [id number]  DATE:01/14/2006                            DOB:          01/14/1935    Mr. Fuqua returns today for followup.  He is a very pleasant man with a  history of paroxysmal atrial fibrillation and recurrent episodes of near  syncope as well as dyslipidemia.  The patient was referred to me back in  August and at that time, I  had seen him and was concerned that perhaps he  was having some bradycardia secondary to near syncopal episodes.  Subsequently a cardiac monitor demonstrated severe bradycardia with heart  rates in the 30s and positive up to three seconds concomitantly with rapid  atrial fibrillation and heart rates in the 160s.  He is only on a small dose  of Flecainide (50 twice a day) and diltiazem.  He now is referred for  additional evaluation and discussion about implantation of a permanent  pacemaker.   PHYSICAL EXAMINATION:  GENERAL:  On exam today he is a pleasant well-  appearing man in no acute distress.  VITAL SIGNS:  Blood pressure was 127/76, pulse 59 and regular, respirations  18, weight 192 pounds.  NECK:  No jugular venous distention.  LUNGS:  Clear bilaterally to auscultation.  CARDIOVASCULAR:  Regular rate and rhythm with normal S1 and S2.  EXTREMITIES:  No edema.   IMPRESSION:  1. Tachybrady syndrome (symptomatic).  2. Chronic Coumadin therapy.  3. Chronic Flecainide therapy.  4. Dyslipidemia.  5. Hypertension.   DISCUSSION:  I discussed again the treatment options with Mr. Dilorenzo.  The  risks, benefits, goals and expectations of permanent pacemaker insertion  have been discussed with him and he would like to proceed with this.  This  will be scheduled as early as possible at a convenient time.                                   [name] [name], MD   GWT/MedQ  DD:  01/14/2006  DT:   01/14/2006  Job #:  [id number]   cc:   Doyle Askew, MD,FACC

## 2010-09-26 NOTE — H&P (Signed)
[name], [name]               ACCOUNT NO.:  [id number]   MEDICAL RECORD NO.:  [id number]          PATIENT TYPE:  INP   LOCATION:  NA                           FACILITY:  [facility]   PHYSICIAN:  Hayk Divis. Veverly Fells, M.D. DATE OF BIRTH:  1935/02/17   DATE OF ADMISSION:  DATE OF DISCHARGE:                              HISTORY & PHYSICAL   CHIEF COMPLAINT:  Left-knee pain.   HISTORY OF PRESENT ILLNESS:  Patient is a 76 year old male with  worsening left-knee pain, refractory to conservative treatment.  The  patient has elected to have a left total knee arthroplasty.   PAST MEDICAL HISTORY:  Significant for ulcerative colitis, hypertension.  Patient does have a pacemaker.   FAMILY MEDICAL HISTORY:  Coronary artery disease.   SOCIAL HISTORY:  Patient of Dr. Gar Ponto.  He is married.  Does not  smoke or use alcohol.   DRUG ALLERGIES:  None.   CURRENT MEDICATIONS:  1. Mesalamine 400 mg eight tabs daily.  2. Simvastatin 40 mg p.o. daily.  3. Atenolol 100 mg p.o. b.i.d.  4. Coumadin 5 mg p.o. daily.   REVIEW OF SYSTEMS:  Pain with ambulation.   PHYSICAL EXAM:  Pulse 84, respirations 16, blood pressure 138/78.  Patient is a healthy-appearing, 76 year old male, in no acute distress,  pleasant mood and affect, oriented times three.  HEAD AND NECK EXAM:  Shows full range of motion without any tenderness.  Cranial nerves II through XII are grossly intact.  CHEST:  Active breath sounds bilaterally, no wheezes, rhonchi or rales.  HEART:  Shows regular rate and rhythm, no murmur.  ABDOMEN:  Nontender, nondistended, with active bowel sounds.  EXTREMITIES:  Show moderate pain and tenderness with range of motion of  bilateral knees with crepitus.  He has trace effusion bilaterally, left  greater than right.  He does have an antalgic gait.  SKIN:  Shows no rashes or edema.  NEUROLOGICALLY:  He is intact in bilateral upper and lower extremities  with strength 5/5 bilaterally.   X-rays  show end-stage osteoarthritis to the left knee.   IMPRESSION:  Left-knee end-stage osteoarthritis.   PLAN OF ACTION:  Left total knee arthroplasty by Dr. Esmond Plants.      Thomas B. Doren Custard, P.A.      [name], M.D.  Electronically Signed    TBD/MEDQ  D:  04/20/2007  T:  04/20/2007  Job:  [id number]

## 2010-09-26 NOTE — Assessment & Plan Note (Signed)
[facility] OFFICE NOTE   NAME:[name], [name]                      MRN:          [id number]  DATE:12/09/2005                            DOB:          05-26-1934    HISTORY OF PRESENT ILLNESS:  The patient is a 76 year old male with a  history of paroxysmal atrial fibrillation.  The patient has been doing quite  well.  He denies any shortness of breath, orthopnea or PND.  He states that  sometimes he becomes anxious and may experience a few palpitations.  He is  not certain if he reverts back to atrial fibrillation or not.  In any event,  these events appear to be short-lived.  He also has occasional episodes of  what he calls fuzziness when he turns his head in a certain direction.  One episode also he reported when leaning forward and then coming up rather  quickly, he felt significant dizziness.  On his electrocardiogram today the  patient remains in sinus bradycardia but overall his heart rate is up from  his last visit.  Dr. Quillian Quince did change his atenolol to Cardizem.  He also  increased his Cozaar for blood pressure management.  Of note, the patient  did show me blood pressure readings in the office today which were quite  erratic with blood pressures as low as 92/55 and as high as 150/95.   MEDICATIONS:  1.  Flecainide 50 mg p.o. b.i.d.  2.  Asacol 400 mg two tablets t.i.d.  3.  Simvastatin 80 mg half a tablet p.o. q.h.s.  4.  Cozaar 100 mg p.o. daily.  5.  Diltiazem 240 mg p.o. daily.   PHYSICAL EXAMINATION:  VITAL SIGNS:  Blood pressure 112/70, heart rate 58  beats per minute, weight 192 pounds.  NECK:  Normal carotid upstroke, no carotid bruits.  LUNGS:  Clear breath sounds bilaterally.  CARDIAC:  Regular rate and rhythm, normal S1, S2.  ABDOMEN:  Soft, nontender, no rebound or guarding, good bowel sounds.  EXTREMITIES:  No cyanosis, clubbing or edema.   Twelve-lead electrocardiogram:   Normal sinus rhythm with right bundle branch  block but otherwise no acute ischemic changes.   PROBLEM LIST:  1.  Paroxysmal atrial fibrillation.  2.  Coumadin anticoagulation followed by Dr. Quillian Quince.  3.  Dyslipidemia.  4.  Ulcerative colitis.  5.  Negative Cardiolite study in the past.   PLAN:  1.  It is not clear that the patient has breakthrough paroxysms.  He is      interested in obtaining a cardiac monitor today to further elucidate      this.  2.  We will refill the patient's prescription of flecainide 50 mg p.o.      b.i.d.  3.  I have asked the patient to change his Cozaar to evening dosing and      diltiazem to morning dosing.  I suspect that he has peaks and troughs in      his antihypertensive regimen.  4.  The patient will follow up with [place] in one year.  Ernestine Mcmurray, MD, [facility]   GED/MedQ  DD:  12/09/2005  DT:  12/09/2005  Job #:  [id number]

## 2010-09-26 NOTE — Assessment & Plan Note (Signed)
[name]                           ELECTROPHYSIOLOGY OFFICE NOTE   NAME:[name]                      MRN:          [id number]  DATE:01/01/2006                            DOB:          28-Oct-1934    Mr. Charter returns is referred today for evaluation by Dr. Dannielle Burn.   REASON FOR REFERRAL:  Evaluation of atrial fibrillation with near syncope.   HISTORY OF PRESENT ILLNESS:  The patient is a very pleasant middle-aged man  with a history of palpitations which have been present now for over a year.  The patient has been in the past found to be in atrial fibrillation and was  actually seen in our office back in August.  At that time, he was on  flecainide 50 mg twice daily placed on that drug under the direction of Dr.  Dannielle Burn.  In the past he has been on beta-blockers but apparently he has had  some intolerance to this.  Back in August, he complained of some near  syncopal episodes which he described as a sudden onset of fuzzy-headedness  which would last for several seconds and then resolve.  There was no frank  syncope and he has had no chest pain or significant shortness of breath.  The patient states that when he is out of rhythm he feels palpitations,  although he has some difficulty in telling when he is in AFib.  He has  documented heart rates in the 90s to 100 range by his blood pressure cuff in  AFib.  Because of the above symptoms, he underwent a 30-day cardiac  monitoring, at present the results are not available of this.   ADDITIONAL PAST MEDICAL HISTORY:  1. Ulcerative colitis which has been stable.  2. He has a history of dyslipidemia.  3. He has a history of chronic Coumadin therapy, followed by Dr. Gar Ponto in [place].   FAMILY HISTORY:  Notable for both parents being deceased.  His mother at age  51 of complications of cancer and his father at age 30 of complications of  heart problems and emphysema.    MEDICATIONS:  1. Warfarin 5 a day.  2. Flecainide 50 mg twice a day.  3. Diltiazem.  4. Cozaar.  5. Simvastatin.  6. Asacol.   SOCIAL HISTORY:  The patient has a history of tobacco use but stopped  smoking 30 years ago.  He denies recreational drug use or alcoholic beverage  use.   REVIEW OF SYSTEMS:  All systems were reviewed and found to be negative  except as noted in the HPI.   PHYSICAL EXAMINATION:  GENERAL:  He is a pleasant, well-appearing, 76-year-  old man in no acute distress.  VITAL SIGNS:  The blood pressure was 134/72; the pulse was 59 and regular;  the respirations were 18; weight was 195 pounds.  HEENT:  Normocephalic and atraumatic.  Pupils equal and round.  The  oropharynx was moist.  The sclerae were anicteric.  NECK:  Revealed no jugular venous distention.  There was no thyromegaly.  Trachea was midline.  The carotids were 2+ and symmetric.  LUNGS:  Clear bilaterally to auscultation and no wheezes, rales, or rhonchi.  There was no increased work of breathing.  CARDIOVASCULAR:  Revealed a regular rate and rhythm with normal S1 and S2.  PMI was not enlarged nor was it loud or displaced.  ABDOMEN:  Soft, nontender, nondistended.  There was no organomegaly.  The  bowel sounds are present.  EXTREMITIES:  Demonstrated no cyanosis, clubbing, or edema.  The pulses were  2+ and symmetric.  NEUROLOGIC:  Alert and oriented x3.  His cranial nerves were intact.  Strength was 5/5 and symmetric.   The EKG demonstrates sinus bradycardia with normal axis and intervals.   IMPRESSION:  1. Paroxysmal atrial fibrillation.  2. Questionable bradycardia with a history of dizziness.  3. Hypertension.  4. Chronic flecainide therapy, albeit at low dose.  5. Chronic Coumadin therapy.   DISCUSSION:  I have discussed the treatment options with Mr. Plemmons in some  detail.  I have recommended that we get the results of his cardiac monitor  and reassess his situation and increase his  dose of flecainide from 50 mg  twice a day to 100 mg twice a day.  If his cardiac monitor shows significant  bradycardia then permanent pacemaker implantation would be required.  If not  that up titration of his flecainide and careful following of the flecainide  level would be warranted.  I will plan to see the patient back once I have  had a chance to review his cardiac monitor and make additional plans at that  time.   ADDENDUM:  After seeing the patient, I had a chance to review the patient's  cardiac monitor strips which demonstrate periods of severe bradycardia  during the daytime and pauses of up to 3 seconds with concomitant rapid  heart rates up to 160 beats per minute.  As a consequence  the patient will be required to proceed with pacemaker implantation and up  titration of his flecainide and calcium channel blocker.                                   [name]. William Le, MD   GWT/MedQ  DD:  01/01/2006  DT:  01/02/2006  Job #:  [id number]   cc:   William Mcmurray, MD, [name]  Gar Ponto

## 2010-09-26 NOTE — Op Note (Signed)
NAMEGRAE, CANNATA               ACCOUNT NO.:  [id number]   MEDICAL RECORD NO.:  [id number]          PATIENT TYPE:  AMB   LOCATION:  SDS                          FACILITY:  [facility]   PHYSICIAN:  Kiel Cockerell. Veverly Fells, M.D. DATE OF BIRTH:  Feb 02, 1935   DATE OF PROCEDURE:  04/23/2006  DATE OF DISCHARGE:  04/23/2006                               OPERATIVE REPORT   PREOPERATIVE DIAGNOSIS:  Left knee pain secondary to chondrocalcinosis  and medial meniscus tear and arthritis.   POSTOPERATIVE DIAGNOSIS:  Left knee pain secondary to chondrocalcinosis  and medial meniscus tear and arthritis, as well as lateral meniscus  tear.   PROCEDURE PERFORMED:  Left knee arthroscopy with debridement of medial  and lateral meniscal tears and abrasion chondroplasty in the  patellofemoral joint as well as in the medial and lateral compartments  as well as removal of loose bodies.   SURGEON:  Zaine Elsass. Veverly Fells, M.D.   ASSISTANT:  None.   ANESTHESIA:  Local anesthesia plus MAC was used.   ESTIMATED BLOOD LOSS:  Was minimal.   FLUID REPLACEMENT:  1200 mL crystalloid.   INSTRUMENT COUNTS:  Correct.   COMPLICATIONS:  No complications.   ANTIBIOTICS:  Perioperative antibiotics given.   INDICATIONS:  The patient is a 76 year old male with worsening  chondrocalcinosis in the left knee.  The patient has MRI documented  medial meniscus tear.  He presents now for operative treatment for that  symptomatic meniscal tear.  The patient does have significant arthritis  in the knee as well and understands that this may partially relieve his  pain but it is unlikely to relieve all the pain.  Informed consent was  obtained.   DESCRIPTION OF OPERATION:  After an adequate level of anesthesia was  achieved, the patient was positioned supine on the operating table.  A  lateral post was utilized.  After sterile prep and drape of the left  knee, the knee was entered arthroscopically using arthroscopic portals.  Superolateral outflow, anterolateral scope and anteromedial working  portals were all created in similar fashion with infiltration of skin  with 0.25% Marcaine with epinephrine followed by incision with the 11  blade scalpel in the direction of the cannula into the joint using  obturators.  Diagnostic arthroscopy revealed severe chondromalacia in  the patellofemoral joint with large flaps of cartilage and some grade 4  changes.  We went ahead and removed all free flaps of cartilage in a  chondroplasty technique getting down to smooth intact articular  cartilage.  Medial and lateral gutters were free of loose bodies.  The  notch was entered.  There were several loose bodies present which were  removed utilizing suction shaver.  The medial compartment was entered.  There was noted to be eburnated bone in the medial compartment both in  the femur and the tibia.  Marginal around the eburnated areas there were  flaps of cartilage that were loose and these were debrided using  motorized shaver.  Additionally there were multiple meniscal tears on  the medial side which appeared to be degenerative in nature and related  to  chondrocalcinosis.  We then used the basket biter and suction shaver  to remove all loose meniscal tissue, smoothing back to stable meniscal  rim.  Basically the patient had subtotal meniscectomy in the medial  compartment.  ACL was degenerative but intact.  PCL was intact.  The  lateral compartment was entered.  There was noted to be a complex  lateral meniscus tear which was debrided back to stable meniscal tissues  using basket forceps and motorized shaver.  In fact, approximately 20-  30% of the lateral meniscus had to be removed.  The remainder of the  articular compartment and lateral compartment actually appeared to be in  good shape.  Following completion of the medial lateral meniscectomy,  abrasion chondroplasty in the medial compartment and the patellofemoral   compartment as well as removal of loose bodies from the notch, the  patient's surgery was completed.  We sutured the wounds using 4-0  Monocryl followed by Steri-Strips and sterile dressing.  The patient  tolerated the surgery well.      [name], M.D.  Electronically Signed     SRN/MEDQ  D:  04/23/2006  T:  04/24/2006  Job:  [id number]

## 2010-09-26 NOTE — Procedures (Signed)
   NAME:  [name], [name] NO.:  [id number]   MEDICAL RECORD NO.:  [id number]                   PATIENT TYPE:  OUT   LOCATION:  RAD                                  FACILITY:  [facility]   PHYSICIAN:  Jacqulyn Ducking, M.D. [facility]           DATE OF BIRTH:  08-27-1934   DATE OF PROCEDURE:  05/09/2002                  AGE:  76  DATE OF DISCHARGE:                              SEX:  M                                CARDIAC ULTRASOUND   ECHOCARDIOGRAM:   REFERRING PHYSICIANS:  Gar Ponto, M.D. and Marijo Conception. Wall, M.D.   CLINICAL INFORMATION:  A 76 year old gentleman with hypertension and atrial  fibrillation.   M-MODE:  AORTA:  3.2  (<4.0)  LEFT ATRIUM:  4.3  (<4.0)  SEPTUM:  1.4  (0.7-1.1)  POSTERIOR WALL:  1.2  (0.7-1.1)  LV-DIASTOLE:  4.8  (<5.7)  LV-SYSTOLE:  2.6  (<4.0)   FINDINGS/IMPRESSION:  1. A technically adequate echocardiographic study.  2. Left atrial size at the upper limit of normal; normal right atrium and     right ventricle.  3. Normal mitral valve with very mild annular calcification and trivial     regurgitation.  4. Normal tricuspid valve.  5. Normal aortic valve.  6. Normal internal dimension of the left ventricle; mild LVH.  Normal     regional and global LV systolic function.  7. Normal IVC.                                               Jacqulyn Ducking, M.D. [facility]    RR/MEDQ  D:  05/09/2002  T:  05/10/2002  Job:  [id number]

## 2010-09-26 NOTE — Assessment & Plan Note (Signed)
[facility]                           ELECTROPHYSIOLOGY OFFICE NOTE   NAME:Penagos, [name]                      MRN:          [id number]  DATE:02/03/2006                            DOB:          09-18-1934    [name] returns today for follow-up.  He is a very pleasant elderly male  with a history of symptomatic tachy-brady syndrome who underwent a permanent  pacemaker insertion 2 days ago.  He had pauses of over 3 seconds in the past  in a setting of a very rapid ventricular response and has atrial  fibrillation at rates of over 150 beats per minute.  The patient was well  until yesterday evening when he noted palpitations, and on awakening this  morning felt dizzy, lightheaded and shaky inside.  He returns for  evaluation.  He denies chest pain or shortness of breath and has had no  syncope.   PHYSICAL EXAMINATION:  GENERAL:  He is a pleasant, well-appearing man in no  acute distress.  VITAL SIGNS:  The blood pressure was 138/86, pulse 76 and regular,  respirations 18, weight 193 pounds.  NECK:  No jugular venous distension.  LUNGS:  Clear bilaterally to auscultation.  CARDIOVASCULAR:  Regular rate and rhythm with a normal S1 and S2.  EXTREMITIES:  Demonstrated no cyanosis, clubbing or edema.   His pacemaker was interrogated and he was subsequently found to be in atrial  fibrillation with a rapid ventricular response with a maximum ventricular  rate of 157 beats per minute.  He had been in atrial fibrillation since 5:40  p.m. on February 02, 2006.   IMPRESSION:  1. Symptomatic tachy-brady syndrome.  2. Atrial fibrillation with re-onset of this yesterday.  3. Chronic Coumadin therapy with therapeutic international normalized      ratios.  4. Chronic flecainide therapy.   DISCUSSION:  I have recommended that [name] increase his flecainide to  150 mg twice a day today.  He will continue his calcium channel blocker,  though I may  ultimately switch that to a beta-blocker for better rate  control with his flecainide.  He is instructed to come and get a  flecainide level in 1 week in our office, and we will base additional  treatment based on the results of his flecainide level.            ______________________________  [name] [name], MD     GWT/MedQ  DD:  02/03/2006  DT:  02/05/2006  Job #:  [id number]   cc:   Ernestine Mcmurray, MD,FACC  Gar Ponto

## 2010-09-26 NOTE — Assessment & Plan Note (Signed)
[facility]                            [place] [facility] OFFICE NOTE   NAME:[name], [name]                      MRN:          [id number]  DATE:115-Nov-202007                            DOB:          1934-08-15    HISTORY OF PRESENT ILLNESS:  Patient is a 75 year old male with a history of  tachybrady syndrome.  The patient is status post pacemaker implantation.  The patient has been maintained on flecainide for maintenance of sinus  rhythm.  Unfortunately, at the higher dose of flecainide of 100 b.i.d., the  patient had significant side-effects which included dizziness and tremors.  The patient felt significantly better after he was changed to a lower dose  of flecainide.  The patient denies having chest pain.  He has no shortness  of breath.  He has no orthopnea or PND.   MEDICATIONS:  1. Atenolol 100 mg a day.  2. Coumadin as directed.  3. Asacol  4. Atenolol 100 mg a day.  5. Multivitamin q.d.  6. Aspirin 81 mg a day.   PHYSICAL EXAMINATION:  VITAL SIGNS:  Blood pressure 130/86.  Heart rate is  68 beats a minute.  Weight is 191 pounds.  NECK EXAM:  Normal carotid upstrokes.  No carotid bruits.  LUNGS:  Clear.  HEART:  Regular rate and rhythm with occasional extra systoles.  ABDOMEN:  Soft and non-tender.  EXTREMITY EXAM:  No cyanosis, clubbing or edema.   PROBLEM LIST:  1. Paroxysmal atrial fibrillation.      a.     Atrial pacing with intrinsic ventricle rhythm.      b.     Side-effects resolved after flecainide dose decreased.      c.     Rule out recurrent atrial fibrillation.  2. Tachybrady syndrome status post permanent pacemaker implantation with      dual-chamber device.  3. History of syncope.  No recurrence.  4. Anticoagulation secondary to atrial fibrillation.  5. Orthostasis, resolved.   PLAN:  1. The has recurrent palpitations, which may well be due to his decreasing      dose of flecainide.  2. EKG will be obtained in the  office today.  3. In the interim, I have asked the patient to increase his Atenolol to      100 in the morning and 50 mg in the evening.  4. If the patient has side-effects we may need to consider changing his      flecainide to amiodarone drug therapy.  The other alternative would be      to use high-dose beta blocker for rate control and discontinue      antiarrhythmic drug therapy.  This will be discussed in the next couple      of weeks, pending his response to increasing dose of Atenolol.     Ernestine Mcmurray, MD,FACC  Electronically Signed    GED/MedQ  DD: 115-Nov-202007  DT: 115-Nov-202007  Job #: [id number]   cc:   Gar Ponto

## 2010-09-28 ENCOUNTER — Encounter: Payer: Self-pay | Admitting: Physician Assistant

## 2010-09-30 ENCOUNTER — Ambulatory Visit (INDEPENDENT_AMBULATORY_CARE_PROVIDER_SITE_OTHER): Payer: Medicare HMO | Admitting: Orthopedic Surgery

## 2010-09-30 DIAGNOSIS — M1710 Unilateral primary osteoarthritis, unspecified knee: Secondary | ICD-10-CM | POA: Insufficient documentation

## 2010-09-30 DIAGNOSIS — IMO0002 Reserved for concepts with insufficient information to code with codable children: Secondary | ICD-10-CM

## 2010-09-30 DIAGNOSIS — M5410 Radiculopathy, site unspecified: Secondary | ICD-10-CM | POA: Insufficient documentation

## 2010-09-30 NOTE — Progress Notes (Signed)
76 year old male followup visit  Diagnosis of atypical posterior lateral knee pain  Patient complained of decreased knee flexion, difficulty getting out of a seated position and going to the bathroom.  Pain continues to be posterolateral.  Some improvement with Neurontin 200 mg at night.  Exam shows knee flexion is 115.  Slight varus deformity.  Tenderness in the posterolateral portion of the knee in the popliteal fossa with minimal pain at the posterolateral joint line.  X-rays show degenerative arthritis with an atypical alignment in the knee where the condyles show valgus-like changes but the compartments show medial compartment changes  Impression atypical knee pain recommend second opinion regarding knee replacement.  Discussed fully with the patient and his wife reluctance to do knee replacement without assurance that this will relieve his knee pain.

## 2010-10-01 ENCOUNTER — Ambulatory Visit (INDEPENDENT_AMBULATORY_CARE_PROVIDER_SITE_OTHER): Payer: Medicare HMO | Admitting: Physician Assistant

## 2010-10-01 ENCOUNTER — Encounter: Payer: Self-pay | Admitting: Physician Assistant

## 2010-10-01 VITALS — BP 131/84 | HR 73 | Ht 73.5 in | Wt 169.0 lb

## 2010-10-01 DIAGNOSIS — E785 Hyperlipidemia, unspecified: Secondary | ICD-10-CM

## 2010-10-01 DIAGNOSIS — I2510 Atherosclerotic heart disease of native coronary artery without angina pectoris: Secondary | ICD-10-CM | POA: Insufficient documentation

## 2010-10-01 DIAGNOSIS — I4891 Unspecified atrial fibrillation: Secondary | ICD-10-CM

## 2010-10-01 NOTE — Progress Notes (Signed)
History of Present Illness: [name] is a 76 y.o. male with a history of atrial fibrillation, status post pacemaker secondary to tachybradycardia syndrome who was seen by Dr. Percival Spanish in [place] 5/1 with an abnormal stress test.  He had some recent chest discomfort.  His Myoview was abnormal with inferior ischemia and normal LV function.  Cardiac catheterization was arranged and demonstrated single vessel CAD with a completely occluded first obtuse marginal with collateral flow.  He had mild to moderate nonobstructive disease elsewhere with a 30% first diagonal, mid LAD 50% and then tandem 50% stenosis and a distal 30% LAD.  There was a proximal 25% RCA and his EF was 55% with inferior hypokinesis.  It was felt that his obtuse marginal stenosis was likely the cause for his abnormal Myoview.  Aggressive risk factor modification is recommended.  He returns for follow up.  He notes one episode of a flushing sensation in his chest.  He denies exertional heaviness or tightness.  He denies significant shortness of breath.  He does occasionally have some palpitations consistent with his previous atrial fibrillation.  He does feel short of breath with this.  This does not seem to be too problematic for him.  He denies syncope.  He denies orthopnea or PND.  He denies significant pedal edema.  Past Medical History  Diagnosis Date  . Hypertension   . Dyslipidemia   . Atrial fibrillation   . CAD (coronary artery disease)     a.  cath 5/12: mLAD 50% then tandem 50%, dLAD 30%; D1 30%; OM1 occluded with collateral flow; pRCA 25%; EF 55% with inf HK;  occluded OM likely cause of perfusion defect on Myoview  . Tachy-brady syndrome     s/p pacer  . Ulcerative colitis     Current Outpatient Prescriptions  Medication Sig Dispense Refill  . atenolol (TENORMIN) 100 MG tablet Take 100 mg by mouth. Take one tablet by mouth two times a day      . gabapentin (NEURONTIN) 100 MG capsule TAKE 2 TABS NIGHTLY FOR 1 WEEK  THEN INCREASE TO 3 TABS IF NO CHANGE IN SYMPTOMS  60 capsule  2  . isosorbide mononitrate (IMDUR) 30 MG 24 hr tablet Take 1 tablet by mouth Daily.      . mesalamine (ASACOL) 400 MG EC tablet Take by mouth. Take four tablets twice daily.      . Multiple Vitamin (MULTIVITAMIN) capsule Take 1 capsule by mouth daily. 1 by mouth daily         . Rivaroxaban (XARELTO) 20 MG TABS Take 1 tablet by mouth daily.       . simvastatin (ZOCOR) 80 MG tablet Take half tablet by mouth at bedtime daily        . warfarin (COUMADIN) 5 MG tablet Take 5 mg by mouth as directed. Use as directed per [facility] office      . aspirin (ASPIRIN LOW DOSE) 81 MG EC tablet Take 81 mg by mouth daily.        Marland Kitchen DISCONTD: isosorbide dinitrate (ISORDIL) 30 MG tablet Take 30 mg by mouth daily.        Marland Kitchen DISCONTD: omeprazole (PRILOSEC) 20 MG capsule Take 20 mg by mouth daily as needed.          Allergies: Allergies  Allergen Reactions  . Nsaids     colitis    Vital Signs: BP 131/84  Pulse 73  Ht 6' 1.5" (1.867 m)  Wt 169 lb (76.658 kg)  BMI 21.99 kg/m2  PHYSICAL EXAM: Well nourished, well developed, in no acute distress HEENT: normal Neck: no JVD Endocrine: No thyromegaly  Cardiac:  normal S1, S2; RRR; no murmur Lungs:  clear to auscultation bilaterally, no wheezing, rhonchi or rales Abd: soft, nontender, no hepatomegaly Ext: no edema; RFA site without hematoma or bruit Skin: warm and dry Neuro:  CNs 2-12 intact, no focal abnormalities noted  EKG:  Atrial pace, heart rate 69, normal axis, nonspecific ST-T wave changes  ASSESSMENT AND PLAN:

## 2010-10-01 NOTE — Assessment & Plan Note (Addendum)
His goal LDL is less than 70.  This is followed by his primary care provider.

## 2010-10-01 NOTE — Assessment & Plan Note (Signed)
He has had one episode of chest discomfort since his heart catheterization.  He can continue on aspirin as long as he does not have any significant bleeding problems.  He will continue on statin therapy.  I recommended that he increase his isosorbide to 60 mg a day.  He prefers to defer this for now.  He can follow up with Dr. [name] in 6 weeks.

## 2010-10-01 NOTE — Patient Instructions (Signed)
Your physician recommends that you schedule a follow-up appointment in: [place] [facility] PER [address], PA-C

## 2010-10-01 NOTE — Assessment & Plan Note (Signed)
Maintaining sinus rhythm.  He is now on Xarelto instead of Coumadin.  Follow up with Dr. [name] in 6 weeks.

## 2010-10-02 ENCOUNTER — Other Ambulatory Visit: Payer: Self-pay | Admitting: Orthopedic Surgery

## 2010-10-02 DIAGNOSIS — M1710 Unilateral primary osteoarthritis, unspecified knee: Secondary | ICD-10-CM

## 2010-10-13 ENCOUNTER — Telehealth: Payer: Self-pay | Admitting: Radiology

## 2010-10-13 NOTE — Telephone Encounter (Signed)
Patient has an appointment 10-13-10 at [facility] with Dr. Delilah Shan to be seen for a second opinion about his knee

## 2010-10-29 NOTE — Cardiovascular Report (Signed)
  NAMEDRAIDEN, MIRSKY NO.:  [id number]  MEDICAL RECORD NO.:  [id number]            PATIENT TYPE:  LOCATION:                                 FACILITY:  PHYSICIAN:  Minus Breeding, MD, FACCDATE OF BIRTH:  23-Jul-1934  DATE OF PROCEDURE:  09/18/2010 DATE OF DISCHARGE:                           CARDIAC CATHETERIZATION   PRIMARY CARE PROVIDER:  Gar Ponto.  CARDIOLOGIST:  Minus Breeding, MD, [facility].  PROCEDURE:  Left heart catheterization/coronary arteriography.  INDICATIONS:  Evaluate patient with chest pain and a stress perfusion study suggesting inferior wall ischemia.  PROCEDURE NOTE:  Left heart catheterization was performed via the right femoral artery.  The artery was cannulated using anterior wall puncture. A #4-French arterial sheath was inserted via the modified Seldinger technique.  Preformed Judkins and a pigtail catheter were utilized.  The patient tolerated the procedure well and left the lab in stable condition.  RESULTS:  Hemodynamics:  LV 139/16, AO 138/97.  Coronaries:  The left main had diffuse luminal irregularities.  The LAD had a proximal long luminal irregularities.  There was mid 50% stenosis.  Further down in the vessel, there were tandem 50% lesions followed by distal 30% stenosis.  First diagonal had long 30% stenosis.  The circumflex had proximal luminal irregularities.  There was a first obtuse marginal which appeared to be a moderate-sized vessel which was completely occluded with some collateral flow.  This may have explained the perfusion defect.  The right coronary artery was a dominant vessel. There was a long proximal 25% stenosis.  PA was moderate-sized and normal.  Posterolateral 2 was moderate-sized and normal.  Left ventriculogram:  The left ventriculogram was obtained in the RAO projection.  The EF was about 55% with some inferior hypokinesis.  CONCLUSION:  Single-vessel obstructive coronary disease.  There was diffuse  nonobstructive disease elsewhere.  PLAN:  The patient needs aggressive medical management.  He needs aggressive risk reduction.  He will continue to be followed closely for this.     Minus Breeding, MD, [facility]    JH/MEDQ  D:  09/18/2010  T:  09/18/2010  Job:  [id number]  cc:   Gar Ponto  Electronically Signed by Minus Breeding MD [facility] on 10/29/2010 11:39:16 AM

## 2010-11-11 ENCOUNTER — Ambulatory Visit (INDEPENDENT_AMBULATORY_CARE_PROVIDER_SITE_OTHER): Payer: Medicare HMO | Admitting: Internal Medicine

## 2010-11-11 ENCOUNTER — Encounter: Payer: Self-pay | Admitting: Internal Medicine

## 2010-11-11 VITALS — BP 129/81 | HR 85 | Ht 73.0 in | Wt 165.0 lb

## 2010-11-11 DIAGNOSIS — I10 Essential (primary) hypertension: Secondary | ICD-10-CM

## 2010-11-11 DIAGNOSIS — I495 Sick sinus syndrome: Secondary | ICD-10-CM

## 2010-11-11 DIAGNOSIS — R42 Dizziness and giddiness: Secondary | ICD-10-CM

## 2010-11-11 DIAGNOSIS — R0602 Shortness of breath: Secondary | ICD-10-CM

## 2010-11-11 DIAGNOSIS — I4891 Unspecified atrial fibrillation: Secondary | ICD-10-CM

## 2010-11-11 DIAGNOSIS — Z950 Presence of cardiac pacemaker: Secondary | ICD-10-CM

## 2010-11-11 LAB — PACEMAKER DEVICE OBSERVATION
AL AMPLITUDE: 5.6 mv
AL IMPEDENCE PM: 544 Ohm
AL THRESHOLD: 0.25 V
BATTERY VOLTAGE: 2.77 V
RV LEAD AMPLITUDE: 5.6 mv
RV LEAD IMPEDENCE PM: 451 Ohm

## 2010-11-11 MED ORDER — ATENOLOL 100 MG PO TABS: ORAL_TABLET | ORAL | Status: DC

## 2010-11-11 NOTE — Assessment & Plan Note (Signed)
He has worsening symptoms, though I am not sure they are related to his atrial fib. Will follow. I may consider anti-arrhythmic therapy in the future.

## 2010-11-11 NOTE — Progress Notes (Signed)
HPI [name] returns today for followup. He is a 76 year old man with symptomatic bradycardia status post pacemaker insertion. Just has fairly severe hypertension. The patient also has a history of a fibrillation and is on medical therapy for this for rate control. He notes that in the last several months he has had increasing fatigue and lack of energy. He typically wakes up and feels okay but by the late morning or early afternoon he experiences fatigue and tiredness. He also notices shortness of breath particularly with exertion. He denies anginal symptoms. Allergies  Allergen Reactions  . Nsaids     colitis     Current Outpatient Prescriptions  Medication Sig Dispense Refill  . aspirin (ASPIRIN LOW DOSE) 81 MG EC tablet Take 81 mg by mouth daily.        Marland Kitchen atenolol (TENORMIN) 100 MG tablet Take 100 mg by mouth. Take one tablet by mouth two times a day      . gabapentin (NEURONTIN) 100 MG capsule TAKE 2 TABS NIGHTLY FOR 1 WEEK THEN INCREASE TO 3 TABS IF NO CHANGE IN SYMPTOMS  60 capsule  2  . isosorbide mononitrate (IMDUR) 30 MG 24 hr tablet Take 1 tablet by mouth Daily.      . mesalamine (ASACOL) 400 MG EC tablet Take by mouth. Take four tablets twice daily.      . Multiple Vitamin (MULTIVITAMIN) capsule Take 1 capsule by mouth daily. 1 by mouth daily         . Rivaroxaban (XARELTO) 20 MG TABS Take 1 tablet by mouth daily.       . simvastatin (ZOCOR) 80 MG tablet Take half tablet by mouth at bedtime daily           Past Medical History  Diagnosis Date  . Hypertension   . Dyslipidemia   . Atrial fibrillation   . CAD (coronary artery disease)     a.  cath 5/12: mLAD 50% then tandem 50%, dLAD 30%; D1 30%; OM1 occluded with collateral flow; pRCA 25%; EF 55% with inf HK;  occluded OM likely cause of perfusion defect on Myoview  . Tachy-brady syndrome     s/p pacer  . Ulcerative colitis     ROS:   All systems reviewed and negative except as noted in the HPI.   Past Surgical  History  Procedure Date  . Implantation of a medtronic dual chamber pacemaker 02/01/06    [name] [name] MD  . Left knee arthroscopy with debridement 04/23/2006    [name] M.D  . Left knee total knee  replacement 05/13/07    [name] M.D.  . Knee surgery   . Back surgery   . Lumbar spine surgery      Family History  Problem Relation Age of Onset  . Lung disease Father     Emphysema  . Cancer Mother      History   Social History  . Marital Status: Married    Spouse Name: N/A    Number of Children: 3  . Years of Education: N/A   Occupational History  . retired    Social History Main Topics  . Smoking status: Former Smoker -- 1.0 packs/day for 15 years    Types: Cigarettes    Quit date: 05/11/1978  . Smokeless tobacco: Not on file  . Alcohol Use: Not on file  . Drug Use: Not on file  . Sexually Active: Not on file   Other Topics Concern  .  Not on file   Social History Narrative  . No narrative on file     BP 129/81  Pulse 85  Ht 6' 1"  (1.854 m)  Wt 165 lb (74.844 kg)  BMI 21.77 kg/m2  Physical Exam:  Well appearing NAD HEENT: Unremarkable Neck:  No JVD, no thyromegally Lymphatics:  No adenopathy Back:  No CVA tenderness Lungs:  Clear. Well-healed pacemaker incision. HEART:  Regular rate rhythm, no murmurs, no rubs, no clicks Abd:  Flat, positive bowel sounds, no organomegally, no rebound, no guarding Ext:  2 plus pulses, no edema, no cyanosis, no clubbing Skin:  No rashes no nodules Neuro:  CN II through XII intact, motor grossly intact  DEVICE  Normal device function.  See PaceArt for details.   Assess/Plan:

## 2010-11-11 NOTE — Patient Instructions (Signed)
Change Atenolol to 50 mg 2 times per day  Change Simvastatin to 40 mg every evening  Your physician has requested that you have en exercise stress myoview. For further information please visit [email or web address]. Please follow instruction sheet, as given.  Follow up with Dr.Taylor in 12 weeks

## 2010-11-12 ENCOUNTER — Encounter: Payer: Self-pay | Admitting: Internal Medicine

## 2010-11-12 NOTE — Assessment & Plan Note (Signed)
His blood pressure is well controlled. He will continue his current meds. 

## 2010-11-12 NOTE — Assessment & Plan Note (Signed)
The etiology of his symptoms is unclear but he is on fairly high dose of beta blocker and I have asked him to reduce his dose to see if his symptoms improve.

## 2010-11-12 NOTE — Assessment & Plan Note (Signed)
His device is working normally. Will recheck in several months.

## 2010-11-13 ENCOUNTER — Encounter: Payer: Medicare HMO | Admitting: *Deleted

## 2010-11-14 ENCOUNTER — Encounter: Payer: Self-pay | Admitting: *Deleted

## 2010-11-25 ENCOUNTER — Ambulatory Visit (HOSPITAL_COMMUNITY): Payer: Medicare HMO | Attending: Internal Medicine | Admitting: Radiology

## 2010-11-25 DIAGNOSIS — I2510 Atherosclerotic heart disease of native coronary artery without angina pectoris: Secondary | ICD-10-CM

## 2010-11-25 DIAGNOSIS — I4891 Unspecified atrial fibrillation: Secondary | ICD-10-CM | POA: Insufficient documentation

## 2010-11-25 DIAGNOSIS — R55 Syncope and collapse: Secondary | ICD-10-CM

## 2010-11-25 DIAGNOSIS — R0989 Other specified symptoms and signs involving the circulatory and respiratory systems: Secondary | ICD-10-CM

## 2010-11-25 DIAGNOSIS — R0602 Shortness of breath: Secondary | ICD-10-CM | POA: Insufficient documentation

## 2010-11-25 MED ORDER — TECHNETIUM TC 99M TETROFOSMIN IV KIT
11.0000 | PACK | Freq: Once | INTRAVENOUS | Status: AC | PRN
  Administered 2010-11-25: 11 via INTRAVENOUS

## 2010-11-25 MED ORDER — TECHNETIUM TC 99M TETROFOSMIN IV KIT
33.0000 | PACK | Freq: Once | INTRAVENOUS | Status: AC | PRN
  Administered 2010-11-25: 33 via INTRAVENOUS

## 2010-11-25 NOTE — Progress Notes (Signed)
[facility] [address] [phone]  Cardiology Nuclear Med Study  VENKAT ANKNEY is a 76 y.o. male [id number] 07-Apr-1935   Nuclear Med Background Indication for Stress Test:  Evaluation for Ischemia; Possible Knee Surgery History:  '07 PTVP; 5/12 ULA:GTXMIWOE ischemia, EF=60%>Cath:Occluded OM-1, LAD 50%, EF=55%; H/O Afib. Cardiac Risk Factors: History of Smoking, Hypertension, Lipids and RBBB  Symptoms:  DOE, Fatigue, Light-Headedness/Dizziness/Near Syncope and Palpitations   Nuclear Pre-Procedure Caffeine/Decaff Intake:  None NPO After: 8:00pm   Lungs:  Clear.  O2 sat 99% on RA. IV 0.9% NS with Angio Cath:  20g  IV Site: R Hand  IV Started by:  Matilde Haymaker, RN  Chest Size (in):  42 Cup Size: n/a  Height: 6' 1"  (1.854 m)  Weight:  170 lb (77.111 kg)  BMI:  Body mass index is 22.43 kg/(m^2). Tech Comments:  Atenolol held x 36 hrs    Nuclear Med Study 1 or 2 day study: 1 day  Stress Test Type:  Stress  Reading MD: Darlin Coco, MD  Order Authorizing Provider:  Cristopher Peru, MD  Resting Radionuclide: Technetium 24mTetrofosmin  Resting Radionuclide Dose: 11 mCi   Stress Radionuclide:  Technetium 940metrofosmin  Stress Radionuclide Dose: 33 mCi           Stress Protocol Rest HR: 79 Stress HR: 170  Rest BP: 112/95 Stress BP: 144/87  Exercise Time (min): 5:30 METS: 7.0   Predicted Max HR: 146 bpm % Max HR: 116.44 bpm Rate Pressure Product: 24480   Dose of Adenosine (mg):  n/a Dose of Lexiscan: n/a mg  Dose of Atropine (mg): n/a Dose of Dobutamine: n/a mcg/kg/min (at max HR)  Stress Test Technologist: ShLetta MoynahanCMA-N  Nuclear Technologist:  StCharlton AmorCNMT     Rest Procedure:  Myocardial perfusion imaging was performed at rest 45 minutes following the intravenous administration of Technetium 9962mtrofosmin.  Rest ECG: RBBB, Atrial Fibrillation, Nonspecific ST-T wave changes with  intermittent V. Pacing  Stress Procedure:  The patient exercised for 5:30 on the treadmill utilizing the Bruce protocol.  The patient stopped due to fatigue and denied any chest pain.  He did c/o shortness of breath, with peak O2 Sat of 89%.  There were diagnostic ST-T wave changes, occasional PVC's and one 3-beat episode of v-tach.  Technetium 52m38mrofosmin was injected at peak exercise and myocardial perfusion imaging was performed after a brief delay.  Stress ECG: Significant ST abnormalities consistent with ischemia.  QPS Raw Data Images:  Normal; no motion artifact; normal heart/lung ratio. Stress Images:  There is decreased uptake in the inferior wall. Also decreased uptake in anterolateral wall. Rest Images:  Normal homogeneous uptake in all areas of the myocardium. Subtraction (SDS):  These findings are consistent with ischemia. Transient Ischemic Dilatation (Normal <1.22):  .99 Lung/Heart Ratio (Normal <0.45):  .99  Quantitative Gated Spect Images QGS EDV:  n/a ml QGS ESV:  n/a ml QGS cine images:  No cine images because of arrhythmia. QGS EF: Study not gated  Impression Exercise Capacity:  Fair exercise capacity. BP Response:  Normal blood pressure response. Clinical Symptoms:  No chest pain. ECG Impression:  Significant ST abnormalities consistent with ischemia. Comparison with Prior Nuclear Study: No images to compare  Overall Impression:  Abnormal stress nuclear study.  There is reversible anterolateral ischemia.  There is reversible inferior ischemia.  EKG shows exercise-induced inferolateral ST-T abnormalities and runs of non-sustained VT.  LV function not obtained.  Darlin Coco

## 2010-11-26 NOTE — Progress Notes (Signed)
nuc med report routed to Dr. [name] 11/25/10 Charlton Amor

## 2010-12-01 ENCOUNTER — Encounter: Payer: Self-pay | Admitting: Physician Assistant

## 2010-12-02 ENCOUNTER — Encounter: Payer: Self-pay | Admitting: Physician Assistant

## 2010-12-02 ENCOUNTER — Ambulatory Visit (INDEPENDENT_AMBULATORY_CARE_PROVIDER_SITE_OTHER): Payer: Medicare HMO | Admitting: Physician Assistant

## 2010-12-02 VITALS — BP 108/58 | HR 84 | Ht 73.5 in | Wt 163.8 lb

## 2010-12-02 DIAGNOSIS — I4891 Unspecified atrial fibrillation: Secondary | ICD-10-CM

## 2010-12-02 DIAGNOSIS — R5383 Other fatigue: Secondary | ICD-10-CM

## 2010-12-02 DIAGNOSIS — E785 Hyperlipidemia, unspecified: Secondary | ICD-10-CM

## 2010-12-02 DIAGNOSIS — I2510 Atherosclerotic heart disease of native coronary artery without angina pectoris: Secondary | ICD-10-CM

## 2010-12-02 DIAGNOSIS — R5381 Other malaise: Secondary | ICD-10-CM

## 2010-12-02 DIAGNOSIS — R9430 Abnormal result of cardiovascular function study, unspecified: Secondary | ICD-10-CM

## 2010-12-02 NOTE — Assessment & Plan Note (Signed)
Questionable relationship to his fatigue.  Decrease atenolol as noted.  Continue Xarelto.  Follow up with Dr. [name] in September.

## 2010-12-02 NOTE — Assessment & Plan Note (Signed)
Etiology unclear.  I reviewed his case today with Dr. [name].  We have decided to hold his simvastatin for now to see if this helps.  We have also decided to decrease his atenolol more to 50 mg once a day.  If his heart rate becomes uncontrolled, we can certainly add digoxin to his medical regimen.  It has been several months since he has had a TSH her CBC.  Check a CBC, basic metabolic panel, LFTs, total CPK, sedimentation rate and TSH today.  Followup with Dr. [name] in September as scheduled.

## 2010-12-02 NOTE — Progress Notes (Signed)
History of Present Illness: Primary Electrophysiologist:  Dr. Cristopher Peru  [name] is a 76 y.o. male with a history of atrial fibrillation, status post pacemaker secondary to tachybradycardia syndrome who was seen by Dr. Percival Spanish in [place] with an abnormal stress test.  He had some recent chest discomfort.  His Myoview was abnormal with inferior ischemia and normal LV function.  Cardiac catheterization was arranged and demonstrated single vessel CAD with a completely occluded first obtuse marginal with collateral flow.  He had mild to moderate nonobstructive disease elsewhere with a 30% first diagonal, mid LAD 50% and then tandem 50% stenosis and a distal 30% LAD.  There was a proximal 25% RCA and his EF was 55% with inferior hypokinesis.  It was felt that his obtuse marginal stenosis was likely the cause for his abnormal Myoview.  Aggressive risk factor modification was recommended.    He was seen by Dr. [name] 7/4.  He noted increasing fatigue and lack of energy.  This typically comes on later in the day.  He also noted dyspnea with exertion.  He denied anginal symptoms.  It was not felt that his symptoms were related to his atrial fibrillation.  But his atenolol was decreased.  Stress testing was ordered.  This was done on 7/17.  He exercised for 5 minutes and 30 seconds and achieved 7 mets.  Images demonstrated reversible anterolateral ischemia. There was reversible inferior ischemia. EKG showed exercise-induced inferolateral ST-T abnormalities and runs of non-sustained VT.  LV function was not obtained.  He was brought back in today to review his abnormal stress test.  He denies chest pain.  No significant DOE.  Notes probable NYHA 2.  Denies orthopnea, PND or edema.  No syncope.  Feels more fatigued throughout the day.  No real difference in his symptoms with decreasing atenolol from 100 to 50 mg BID.  Has noted some dizziness.  Feels lightheaded.  No true spinning.  Has had 20 lb weight loss  in the last year.  He is an Research scientist (physical sciences).  Gets annual PFTs and CXR's through [facility] due to asbestos exposure in the past.  He had a colonoscopy last year that was ok.   Past Medical History  Diagnosis Date  . Hypertension   . Dyslipidemia   . Atrial fibrillation   . CAD (coronary artery disease)     a.  cath 5/12: mLAD 50% then tandem 50%, dLAD 30%; D1 30%; OM1 occluded with collateral flow; pRCA 25%; EF 55% with inf HK;  occluded OM likely cause of perfusion defect on Myoview  . Tachy-brady syndrome     s/p pacer  . Ulcerative colitis   . Dizziness   . Nonspecific abnormal unspecified cardiovascular function study     Current Outpatient Prescriptions  Medication Sig Dispense Refill  . aspirin (ASPIRIN LOW DOSE) 81 MG EC tablet Take 81 mg by mouth daily.        Marland Kitchen atenolol (TENORMIN) 100 MG tablet Take one half tablet by mouth two times a day      . gabapentin (NEURONTIN) 100 MG capsule TAKE 2 TABS NIGHTLY FOR 1 WEEK THEN INCREASE TO 3 TABS IF NO CHANGE IN SYMPTOMS  60 capsule  2  . isosorbide mononitrate (IMDUR) 30 MG 24 hr tablet Take 1 tablet by mouth Daily.      . mesalamine (ASACOL) 400 MG EC tablet Take by mouth. Take four tablets twice daily.      . Multiple Vitamin (MULTIVITAMIN) capsule Take  1 capsule by mouth daily. 1 by mouth daily         . Rivaroxaban (XARELTO) 20 MG TABS Take 1 tablet by mouth daily.       . simvastatin (ZOCOR) 80 MG tablet Take half tablet by mouth at bedtime daily          Allergies: Allergies  Allergen Reactions  . Nsaids     colitis    Vital Signs: BP 108/58  Pulse 84  Ht 6' 1.5" (1.867 m)  Wt 163 lb 12.8 oz (74.299 kg)  BMI 21.32 kg/m2  PHYSICAL EXAM: Well nourished, well developed, in no acute distress HEENT: normal Neck: no JVD Endocrine: No thyromegaly  Cardiac:  normal S1, S2; irregularly irregular; no murmur Lungs:  clear to auscultation bilaterally, no wheezing, rhonchi or rales Abd: soft, nontender, no hepatomegaly Ext: no  edema Skin: warm and dry Neuro:  CNs 2-12 intact, no focal abnormalities noted  EKG:    Atrial fibrillation, heart rate 84, left axis deviation, occasional ventricular paced, nonspecific ST-T wave changes  ASSESSMENT AND PLAN:

## 2010-12-02 NOTE — Assessment & Plan Note (Signed)
No angina.  Continue aspirin.

## 2010-12-02 NOTE — Assessment & Plan Note (Signed)
Hold simvastatin for now as noted.  Check a CPK.

## 2010-12-02 NOTE — Assessment & Plan Note (Signed)
I reviewed his stress test with Dr. [name].  The abnormal findings are consistent with his known occluded obtuse marginal-one.  No further testing is warranted.

## 2010-12-02 NOTE — Patient Instructions (Signed)
Your physician recommends that you return for lab work in: TODAY CBC W/DIFF, CPK, ESR, LFT'S, BMET, TSH DX  FATIGUE  KEEP APPT WITH DR. [name] IN September  HOLD [place] (SIMVASTATIN) [place] AS PER SCOTT WEAVER, PA-C  Your physician has recommended you make the following change in your medication: DECREASE ATENOLOL 50 MG 1 TABLET ONCE DAILY

## 2010-12-03 LAB — HEPATIC FUNCTION PANEL
ALT: 14 U/L (ref 0–53)
AST: 20 U/L (ref 0–37)
Alkaline Phosphatase: 49 U/L (ref 39–117)
Bilirubin, Direct: 0.2 mg/dL (ref 0.0–0.3)
Total Protein: 7.6 g/dL (ref 6.0–8.3)

## 2010-12-03 LAB — CBC WITH DIFFERENTIAL/PLATELET
Basophils Absolute: 0 10*3/uL (ref 0.0–0.1)
Basophils Relative: 0.3 % (ref 0.0–3.0)
HCT: 40.7 % (ref 39.0–52.0)
Hemoglobin: 13.6 g/dL (ref 13.0–17.0)
Lymphs Abs: 2.3 10*3/uL (ref 0.7–4.0)
MCHC: 33.5 g/dL (ref 30.0–36.0)
Monocytes Relative: 7.7 % (ref 3.0–12.0)
Neutro Abs: 6 10*3/uL (ref 1.4–7.7)
RBC: 4.54 Mil/uL (ref 4.22–5.81)
RDW: 14.7 % — ABNORMAL HIGH (ref 11.5–14.6)

## 2010-12-03 LAB — BASIC METABOLIC PANEL
BUN: 20 mg/dL (ref 6–23)
Creatinine, Ser: 1.2 mg/dL (ref 0.4–1.5)
GFR: 60.48 mL/min (ref >60.00)

## 2010-12-24 ENCOUNTER — Encounter: Payer: Self-pay | Admitting: Physician Assistant

## 2011-01-14 ENCOUNTER — Ambulatory Visit (INDEPENDENT_AMBULATORY_CARE_PROVIDER_SITE_OTHER): Payer: Medicare HMO | Admitting: Internal Medicine

## 2011-01-14 ENCOUNTER — Encounter: Payer: Self-pay | Admitting: Internal Medicine

## 2011-01-14 DIAGNOSIS — I4891 Unspecified atrial fibrillation: Secondary | ICD-10-CM

## 2011-01-14 DIAGNOSIS — I10 Essential (primary) hypertension: Secondary | ICD-10-CM

## 2011-01-14 DIAGNOSIS — Z950 Presence of cardiac pacemaker: Secondary | ICD-10-CM

## 2011-01-14 LAB — PACEMAKER DEVICE OBSERVATION
AL AMPLITUDE: 5.6 mv
ATRIAL PACING PM: 84
BAMS-0001: 175 {beats}/min
BATTERY VOLTAGE: 2.77 V
RV LEAD THRESHOLD: 0.75 V
VENTRICULAR PACING PM: 1

## 2011-01-14 NOTE — Patient Instructions (Signed)
Your physician wants you to follow-up in:  12 months.  You will receive a reminder letter in the mail two months in advance. If you don't receive a letter, please call our office to schedule the follow-up appointment.   

## 2011-01-14 NOTE — Assessment & Plan Note (Signed)
He is maintaining NSR. He will continue his current meds.

## 2011-01-14 NOTE — Progress Notes (Signed)
HPI [name] returns today for followup. He is a 76 yo man with a h/o symptomatic bradycardia, PAF, s/p PPM. He has continued to do well except for arthritis. No c/p, sob, or palpitations. No syncope. He recently received a steroid injection in his knee and his pain has resolved.  Allergies  Allergen Reactions  . Nsaids     colitis     Current Outpatient Prescriptions  Medication Sig Dispense Refill  . atenolol (TENORMIN) 100 MG tablet TAKE 1/2 (HALF) TABLET  TWICE A DAY      . gabapentin (NEURONTIN) 100 MG capsule THREE TABS  AT NIGHT       . isosorbide mononitrate (IMDUR) 30 MG 24 hr tablet Take 1 tablet by mouth Daily.      . mesalamine (ASACOL) 400 MG EC tablet Take by mouth. Take four tablets twice daily.      . Multiple Vitamin (MULTIVITAMIN) capsule Take 1 capsule by mouth daily. 1 by mouth daily         . Rivaroxaban (XARELTO) 20 MG TABS Take 1 tablet by mouth daily.          Past Medical History  Diagnosis Date  . Hypertension   . Dyslipidemia   . Atrial fibrillation   . CAD (coronary artery disease)     a.  cath 5/12: mLAD 50% then tandem 50%, dLAD 30%; D1 30%; OM1 occluded with collateral flow; pRCA 25%; EF 55% with inf HK;  occluded OM likely cause of perfusion defect on Myoview  . Tachy-brady syndrome     s/p pacer  . Ulcerative colitis   . Dizziness   . Nonspecific abnormal unspecified cardiovascular function study     ROS:   All systems reviewed and negative except as noted in the HPI.   Past Surgical History  Procedure Date  . Implantation of a medtronic dual chamber pacemaker 02/01/06    [name] [name] MD  . Left knee arthroscopy with debridement 04/23/2006    [name] M.D  . Left knee total knee  replacement 05/13/07    [name] M.D.  . Knee surgery   . Back surgery   . Lumbar spine surgery   . Insert / replace / remove pacemaker     MEDTRONIC....PPM Serial Number:  [id number].....Marland KitchenPPM Model Number:  [id number]....Marland KitchenPPM DOI:   02/01/2006    . Cardiac catheterization 09/20/10    EF 55%.....Dr. Percival Spanish     Family History  Problem Relation Age of Onset  . Lung disease Father     Emphysema  . Cancer Mother      History   Social History  . Marital Status: Married    Spouse Name: N/A    Number of Children: 3  . Years of Education: N/A   Occupational History  . retired    Social History Main Topics  . Smoking status: Former Smoker -- 1.0 packs/day for 15 years    Types: Cigarettes    Quit date: 05/11/1978  . Smokeless tobacco: Not on file  . Alcohol Use: Not on file  . Drug Use: No  . Sexually Active: Not on file   Other Topics Concern  . Not on file   Social History Narrative  . No narrative on file     BP 115/70  Pulse 63  Ht 6' 1"  (1.854 m)  Wt 166 lb (75.297 kg)  BMI 21.90 kg/m2  Physical Exam:  Well appearing NAD HEENT: Unremarkable Neck:  No JVD, no thyromegally  Lymphatics:  No adenopathy Back:  No CVA tenderness Lungs:  Clear with no wheezes, rales, or rhonchi. HEART:  Regular rate rhythm, no murmurs, no rubs, no clicks Abd:  soft, positive bowel sounds, no organomegally, no rebound, no guarding Ext:  2 plus pulses, no edema, no cyanosis, no clubbing Skin:  No rashes no nodules Neuro:  CN II through XII intact, motor grossly intact  DEVICE  Normal device function.  See PaceArt for details.   Assess/Plan:

## 2011-01-14 NOTE — Assessment & Plan Note (Signed)
His blood pressure appears to be well controlled. He will continue his current meds.

## 2011-01-14 NOTE — Assessment & Plan Note (Signed)
His device is working normally. Will recheck in several months.

## 2011-01-29 LAB — BASIC METABOLIC PANEL
BUN: 11
CO2: 26
CO2: 28
Chloride: 101
Chloride: 102
Chloride: 108
Creatinine, Ser: 1.21
GFR calc Af Amer: >60
GFR calc non Af Amer: >60
GFR calc non Af Amer: >60
Glucose, Bld: 106 — ABNORMAL HIGH
Glucose, Bld: 112 — ABNORMAL HIGH
Glucose, Bld: 124 — ABNORMAL HIGH
Potassium: 4
Potassium: 4.9
Sodium: 136

## 2011-01-29 LAB — URINE MICROSCOPIC-ADD ON

## 2011-01-29 LAB — COMPREHENSIVE METABOLIC PANEL
ALT: 27
AST: 25
Alkaline Phosphatase: 46
CO2: 23
Chloride: 105
GFR calc Af Amer: >60
GFR calc non Af Amer: >60
Glucose, Bld: 115 — ABNORMAL HIGH
Potassium: 4.1
Sodium: 142
Total Bilirubin: 0.5

## 2011-01-29 LAB — PROTIME-INR: Prothrombin Time: 17.9 — ABNORMAL HIGH

## 2011-01-29 LAB — URINALYSIS, ROUTINE W REFLEX MICROSCOPIC
Glucose, UA: NEGATIVE
Hgb urine dipstick: NEGATIVE
Leukocytes, UA: NEGATIVE
pH: 5.5

## 2011-01-29 LAB — CBC
HCT: 34.2 — ABNORMAL LOW
Hemoglobin: 12 — ABNORMAL LOW
Hemoglobin: 15
MCHC: 33.8
MCHC: 34.7
MCHC: 35
MCV: 87.9
MCV: 90.8
RBC: 3.48 — ABNORMAL LOW
RBC: 4.91
RDW: 14.1
WBC: 8.9

## 2011-02-12 ENCOUNTER — Encounter: Payer: Medicare HMO | Admitting: *Deleted

## 2011-02-13 LAB — ABO/RH: ABO/RH(D): A POS

## 2011-02-13 LAB — URINALYSIS, ROUTINE W REFLEX MICROSCOPIC
Bilirubin Urine: NEGATIVE
Glucose, UA: NEGATIVE
Ketones, ur: NEGATIVE
pH: 6.5

## 2011-02-13 LAB — BASIC METABOLIC PANEL
CO2: 28
Glucose, Bld: 84
Potassium: 4.5
Sodium: 139

## 2011-02-13 LAB — DIFFERENTIAL
Basophils Absolute: 0
Basophils Absolute: 0.1
Basophils Relative: 1
Eosinophils Relative: 2
Lymphocytes Relative: 3 — ABNORMAL LOW
Monocytes Absolute: 0.7
Monocytes Absolute: 0.9
Neutro Abs: 13.9 — ABNORMAL HIGH
Neutrophils Relative %: 92 — ABNORMAL HIGH

## 2011-02-13 LAB — TYPE AND SCREEN: Antibody Screen: NEGATIVE

## 2011-02-13 LAB — CBC
HCT: 44.3
Hemoglobin: 15.1
Hemoglobin: 16.9
MCHC: 34.1
MCV: 89.2
RDW: 14.2
RDW: 14.4

## 2011-02-23 ENCOUNTER — Other Ambulatory Visit (INDEPENDENT_AMBULATORY_CARE_PROVIDER_SITE_OTHER): Payer: Self-pay | Admitting: Internal Medicine

## 2011-04-16 ENCOUNTER — Encounter: Payer: Medicare HMO | Admitting: *Deleted

## 2011-04-23 ENCOUNTER — Encounter: Payer: Self-pay | Admitting: *Deleted

## 2011-04-27 ENCOUNTER — Encounter (HOSPITAL_COMMUNITY): Admission: RE | Payer: Self-pay | Source: Ambulatory Visit

## 2011-04-27 ENCOUNTER — Inpatient Hospital Stay (HOSPITAL_COMMUNITY): Admission: RE | Admit: 2011-04-27 | Payer: Medicare HMO | Source: Ambulatory Visit | Admitting: Orthopedic Surgery

## 2011-04-27 SURGERY — ARTHROPLASTY, KNEE, TOTAL
Anesthesia: Choice | Laterality: Right

## 2011-04-28 ENCOUNTER — Encounter: Payer: Self-pay | Admitting: Internal Medicine

## 2011-04-28 ENCOUNTER — Other Ambulatory Visit: Payer: Self-pay | Admitting: Internal Medicine

## 2011-04-28 ENCOUNTER — Ambulatory Visit (INDEPENDENT_AMBULATORY_CARE_PROVIDER_SITE_OTHER): Payer: Medicare HMO | Admitting: *Deleted

## 2011-04-28 DIAGNOSIS — I495 Sick sinus syndrome: Secondary | ICD-10-CM

## 2011-04-29 LAB — REMOTE PACEMAKER DEVICE
AL IMPEDENCE PM: 580 Ohm
AL THRESHOLD: 0.5 V
ATRIAL PACING PM: 88
RV LEAD IMPEDENCE PM: 459 Ohm
RV LEAD THRESHOLD: 0.75 V

## 2011-05-08 NOTE — Progress Notes (Signed)
PPM remote 

## 2011-05-13 ENCOUNTER — Encounter: Payer: Self-pay | Admitting: Internal Medicine

## 2011-05-13 ENCOUNTER — Encounter: Payer: Self-pay | Admitting: *Deleted

## 2011-05-13 DIAGNOSIS — J309 Allergic rhinitis, unspecified: Secondary | ICD-10-CM | POA: Diagnosis not present

## 2011-05-13 DIAGNOSIS — R05 Cough: Secondary | ICD-10-CM | POA: Diagnosis not present

## 2011-05-13 DIAGNOSIS — T7840XA Allergy, unspecified, initial encounter: Secondary | ICD-10-CM | POA: Diagnosis not present

## 2011-05-13 DIAGNOSIS — R5381 Other malaise: Secondary | ICD-10-CM | POA: Diagnosis not present

## 2011-05-13 DIAGNOSIS — L500 Allergic urticaria: Secondary | ICD-10-CM | POA: Diagnosis not present

## 2011-05-18 DIAGNOSIS — R059 Cough, unspecified: Secondary | ICD-10-CM | POA: Diagnosis not present

## 2011-05-18 DIAGNOSIS — Z950 Presence of cardiac pacemaker: Secondary | ICD-10-CM | POA: Diagnosis not present

## 2011-05-18 DIAGNOSIS — K219 Gastro-esophageal reflux disease without esophagitis: Secondary | ICD-10-CM | POA: Diagnosis not present

## 2011-05-18 DIAGNOSIS — R05 Cough: Secondary | ICD-10-CM | POA: Diagnosis not present

## 2011-05-20 DIAGNOSIS — M1990 Unspecified osteoarthritis, unspecified site: Secondary | ICD-10-CM | POA: Diagnosis not present

## 2011-05-20 DIAGNOSIS — I10 Essential (primary) hypertension: Secondary | ICD-10-CM | POA: Diagnosis not present

## 2011-05-20 DIAGNOSIS — E782 Mixed hyperlipidemia: Secondary | ICD-10-CM | POA: Diagnosis not present

## 2011-05-20 DIAGNOSIS — I4891 Unspecified atrial fibrillation: Secondary | ICD-10-CM | POA: Diagnosis not present

## 2011-05-20 DIAGNOSIS — R21 Rash and other nonspecific skin eruption: Secondary | ICD-10-CM | POA: Diagnosis not present

## 2011-05-28 ENCOUNTER — Encounter (INDEPENDENT_AMBULATORY_CARE_PROVIDER_SITE_OTHER): Payer: Self-pay | Admitting: *Deleted

## 2011-06-01 DIAGNOSIS — I10 Essential (primary) hypertension: Secondary | ICD-10-CM | POA: Diagnosis not present

## 2011-06-01 DIAGNOSIS — I4891 Unspecified atrial fibrillation: Secondary | ICD-10-CM | POA: Diagnosis not present

## 2011-06-01 DIAGNOSIS — M1990 Unspecified osteoarthritis, unspecified site: Secondary | ICD-10-CM | POA: Diagnosis not present

## 2011-06-01 DIAGNOSIS — R21 Rash and other nonspecific skin eruption: Secondary | ICD-10-CM | POA: Diagnosis not present

## 2011-06-01 DIAGNOSIS — E782 Mixed hyperlipidemia: Secondary | ICD-10-CM | POA: Diagnosis not present

## 2011-06-08 ENCOUNTER — Encounter (INDEPENDENT_AMBULATORY_CARE_PROVIDER_SITE_OTHER): Payer: Self-pay | Admitting: Internal Medicine

## 2011-06-08 ENCOUNTER — Ambulatory Visit (INDEPENDENT_AMBULATORY_CARE_PROVIDER_SITE_OTHER): Payer: Medicare Other | Admitting: Internal Medicine

## 2011-06-08 DIAGNOSIS — R634 Abnormal weight loss: Secondary | ICD-10-CM

## 2011-06-08 DIAGNOSIS — K5190 Ulcerative colitis, unspecified, without complications: Secondary | ICD-10-CM

## 2011-06-08 DIAGNOSIS — R21 Rash and other nonspecific skin eruption: Secondary | ICD-10-CM

## 2011-06-08 DIAGNOSIS — L509 Urticaria, unspecified: Secondary | ICD-10-CM | POA: Diagnosis not present

## 2011-06-08 NOTE — Patient Instructions (Signed)
Stop by at the office for a weight check in 2 months. Office visit in 6 months. No change in dose of Asacol for now.

## 2011-06-08 NOTE — Progress Notes (Signed)
Presenting complaint; Followup for ulcerative colitis. Asian complains of weight loss dysphagia and pruritic rash. Subjective: [name] is 77 year old Caucasian male with chronic ulcerative colitis whose last colonoscopy was in November 2011 who is here for scheduled visit he has multiple complaints. He states he has lost over 30 pounds. His peak weight has been 212 pounds but 4 months ago he was down 263 pounds but now back up to 174 pounds. I do not have his weight from last visit as those records are not available. He complains of a rash or pruritus which started in July. She seen Dr. Cari Caraway beavers who is a dermatologist as well as Dr. Shaune Leeks, and allergy specialist. Both these physicians felt that he had a rash due to to dust mites but therapy has been ineffective. He has an appointment to see Dr. Gar Ponto today to discuss treatment options. He states he has a good appetite. He generally eats 2 meals a day but snacks often. He has intermittent diarrhea but never more than 2 or 3 stools per day. He denies melena or rectal bleeding. He also denies fever chills or night sweats. He is taking ranitidine for his rash but again he says it has not helped him. Rash primarily involves its trunk. He was unable to sleep until he was begun on mirtazapine. He also complains of dysphagia but he feels that he eats his food too fast and does not even bother chewing. He points to his suprasternal area site of bolus obstruction which is transient and he has never experienced an episode of food impaction or regurgitation; he denies frequent heartburn. Current Medications: Current Outpatient Prescriptions  Medication Sig Dispense Refill  . atenolol (TENORMIN) 100 MG tablet TAKE 1/2 (HALF) TABLET  TWICE A DAY      . cetirizine (ZYRTEC ALLERGY) 10 MG tablet Take 10 mg by mouth daily.      . isosorbide mononitrate (IMDUR) 30 MG 24 hr tablet Take 1 tablet by mouth Daily.      . mesalamine (ASACOL) 400 MG EC tablet  Take by mouth. Take four tablets twice daily.      . mirtazapine (REMERON) 15 MG tablet Take 15 mg by mouth at bedtime.      . montelukast (SINGULAIR) 10 MG tablet Take 10 mg by mouth at bedtime.      . Multiple Vitamin (MULTIVITAMIN) capsule Take 1 capsule by mouth daily. 1 by mouth daily         . ranitidine (ZANTAC) 150 MG tablet Take 150 mg by mouth 2 (two) times daily.      . Rivaroxaban (XARELTO) 20 MG TABS Take 1 tablet by mouth daily.         Objective: Blood pressure 140/80, pulse 82, temperature 97.4 F (36.3 C), temperature source Oral, resp. rate 14, height 6' 1"  (1.854 m), weight 174 lb (78.926 kg). Patient does not appear to be in any distress. Conjunctiva is pink. Sclera is nonicteric Oral pharyngeal mucosa is normal. No neck masses or thyromegaly noted. He has pectum excavatum deformity to his anterior chest wall Cardiac exam with regular rhythm normal S1 and S2. No murmur or gallop noted. Lungs are clear to auscultation. Abdomen. Bowel sounds are normal. On palpation soft and nontender without organomegaly or masses.  No LE edema or clubbing noted. He has maculopapular rash involving upper and lower back as well as chest anteriorly. No rash noted involving skin of his abdomen and extremities. He does have some scratch marks.  Labs/studies Results:  Sedimentation rate from 12/02/2010 was 11. WBC was 9.4, H&H 13.6 and 57.2 and eosinophilic count 6.2%     Assessment: #1. Chronic ulcerative colitis. His last colonoscopy was in November 2011. Intermittent diarrhea may be secondary to IBS. His disease appears to be in remission. #2. Weight loss. He has gained 11 pounds from from low weight of 163 pounds. He does not have any alarm symptoms. Therefore will monitor his weight. #3 dysphagia. This is sporadic nonprogressive symptom and may be due to poor chewing of food. #4. Maculopapular rash. Doubt that it is secondary to Asacol. If no other etiologies identified and he does  not respond to any treatment will consider getting him off mesalamine.    Plan: I have talked with Dr. Gar Ponto over the phone or see if her rash could be treated with prednisone since other therapies have been ineffective. She does not respond to steroid therapy I am afraid he will need skin biopsy. He will continue Asacol at current dose which is 1.6 g by mouth twice a day. He will stop by for weight check in 2 months and for office visit in 6 months. Patient  advised to chew thoroughly before you attempt to swallow. He will call if this symptom gets worse. Will review old records when available.

## 2011-06-12 DIAGNOSIS — M1710 Unilateral primary osteoarthritis, unspecified knee: Secondary | ICD-10-CM | POA: Diagnosis not present

## 2011-06-26 ENCOUNTER — Other Ambulatory Visit (INDEPENDENT_AMBULATORY_CARE_PROVIDER_SITE_OTHER): Payer: Self-pay | Admitting: Internal Medicine

## 2011-07-06 DIAGNOSIS — L509 Urticaria, unspecified: Secondary | ICD-10-CM | POA: Diagnosis not present

## 2011-07-07 DIAGNOSIS — H40059 Ocular hypertension, unspecified eye: Secondary | ICD-10-CM | POA: Diagnosis not present

## 2011-07-28 DIAGNOSIS — M1710 Unilateral primary osteoarthritis, unspecified knee: Secondary | ICD-10-CM | POA: Diagnosis not present

## 2011-07-30 ENCOUNTER — Encounter: Payer: Self-pay | Admitting: Internal Medicine

## 2011-07-30 ENCOUNTER — Ambulatory Visit (INDEPENDENT_AMBULATORY_CARE_PROVIDER_SITE_OTHER): Payer: Medicare Other | Admitting: *Deleted

## 2011-07-30 DIAGNOSIS — I495 Sick sinus syndrome: Secondary | ICD-10-CM

## 2011-08-03 DIAGNOSIS — R51 Headache: Secondary | ICD-10-CM | POA: Diagnosis not present

## 2011-08-03 DIAGNOSIS — R071 Chest pain on breathing: Secondary | ICD-10-CM | POA: Diagnosis not present

## 2011-08-03 DIAGNOSIS — L509 Urticaria, unspecified: Secondary | ICD-10-CM | POA: Diagnosis not present

## 2011-08-03 DIAGNOSIS — I4891 Unspecified atrial fibrillation: Secondary | ICD-10-CM | POA: Diagnosis not present

## 2011-08-03 LAB — REMOTE PACEMAKER DEVICE
AL IMPEDENCE PM: 562 Ohm
ATRIAL PACING PM: 87
RV LEAD THRESHOLD: 0.875 V

## 2011-08-10 ENCOUNTER — Encounter: Payer: Self-pay | Admitting: *Deleted

## 2011-08-11 ENCOUNTER — Telehealth (INDEPENDENT_AMBULATORY_CARE_PROVIDER_SITE_OTHER): Payer: Self-pay | Admitting: *Deleted

## 2011-08-11 DIAGNOSIS — G319 Degenerative disease of nervous system, unspecified: Secondary | ICD-10-CM | POA: Diagnosis not present

## 2011-08-11 DIAGNOSIS — I6789 Other cerebrovascular disease: Secondary | ICD-10-CM | POA: Diagnosis not present

## 2011-08-11 DIAGNOSIS — R51 Headache: Secondary | ICD-10-CM | POA: Diagnosis not present

## 2011-08-11 NOTE — Telephone Encounter (Signed)
Patient presented to the office for a weight check today - 175.3 lbs. [name] states that he feels better and desires to eat but it does not take much food and he feels full.

## 2011-08-11 NOTE — Progress Notes (Signed)
PPM remote 

## 2011-08-12 ENCOUNTER — Other Ambulatory Visit: Payer: Self-pay | Admitting: *Deleted

## 2011-08-12 MED ORDER — RIVAROXABAN 20 MG PO TABS: 20.0000 mg | ORAL_TABLET | Freq: Every day | ORAL | Status: DC

## 2011-08-12 MED ORDER — RIVAROXABAN 20 MG PO TABS: 1.0000 | ORAL_TABLET | Freq: Every day | ORAL | Status: DC

## 2011-08-13 ENCOUNTER — Other Ambulatory Visit: Payer: Self-pay | Admitting: *Deleted

## 2011-08-13 MED ORDER — RIVAROXABAN 20 MG PO TABS: 20.0000 mg | ORAL_TABLET | Freq: Every day | ORAL | Status: DC

## 2011-08-14 ENCOUNTER — Other Ambulatory Visit: Payer: Self-pay | Admitting: *Deleted

## 2011-08-25 ENCOUNTER — Other Ambulatory Visit: Payer: Self-pay

## 2011-08-25 MED ORDER — RIVAROXABAN 20 MG PO TABS: 20.0000 mg | ORAL_TABLET | Freq: Every day | ORAL | Status: DC

## 2011-08-25 NOTE — Telephone Encounter (Signed)
..   Requested Prescriptions   Signed Prescriptions Disp Refills  . Rivaroxaban (XARELTO) 20 MG TABS 90 tablet 2    Sig: Take 20 mg by mouth daily.    Authorizing Provider: Minus Breeding    Ordering User: Lynda Capistran M  resent to right source not [facility]

## 2011-08-26 DIAGNOSIS — L500 Allergic urticaria: Secondary | ICD-10-CM | POA: Diagnosis not present

## 2011-08-26 DIAGNOSIS — L739 Follicular disorder, unspecified: Secondary | ICD-10-CM | POA: Diagnosis not present

## 2011-08-28 ENCOUNTER — Other Ambulatory Visit: Payer: Self-pay

## 2011-08-28 MED ORDER — ISOSORBIDE MONONITRATE ER 30 MG PO TB24: 30.0000 mg | ORAL_TABLET | Freq: Every day | ORAL | Status: DC

## 2011-08-28 NOTE — Telephone Encounter (Signed)
..   Requested Prescriptions   Signed Prescriptions Disp Refills  . isosorbide mononitrate (IMDUR) 30 MG 24 hr tablet 90 tablet 4    Sig: Take 1 tablet (30 mg total) by mouth daily.    Authorizing Provider: Minus Breeding    Ordering User: Ardis Hughs, Ivionna Verley Jerilynn Mages

## 2011-08-29 NOTE — Telephone Encounter (Signed)
Patient called but no answer. [name] please call patient on Monday and find out if he wants to come for ov in May or wait for another weight check.

## 2011-09-02 NOTE — Telephone Encounter (Signed)
Patient called ,message left on voicemail. I ask that he call and let us know if he wanted an appointment in May or just wanted to wait for next weight check.

## 2011-09-07 ENCOUNTER — Other Ambulatory Visit: Payer: Self-pay | Admitting: Cardiology

## 2011-09-07 MED ORDER — ISOSORBIDE MONONITRATE ER 30 MG PO TB24: 30.0000 mg | ORAL_TABLET | Freq: Every day | ORAL | Status: DC

## 2011-09-08 DIAGNOSIS — L259 Unspecified contact dermatitis, unspecified cause: Secondary | ICD-10-CM | POA: Diagnosis not present

## 2011-09-08 DIAGNOSIS — D485 Neoplasm of uncertain behavior of skin: Secondary | ICD-10-CM | POA: Diagnosis not present

## 2011-09-22 ENCOUNTER — Ambulatory Visit (INDEPENDENT_AMBULATORY_CARE_PROVIDER_SITE_OTHER): Payer: Medicare Other | Admitting: Internal Medicine

## 2011-09-22 ENCOUNTER — Encounter (INDEPENDENT_AMBULATORY_CARE_PROVIDER_SITE_OTHER): Payer: Self-pay | Admitting: Internal Medicine

## 2011-09-22 DIAGNOSIS — K5190 Ulcerative colitis, unspecified, without complications: Secondary | ICD-10-CM | POA: Diagnosis not present

## 2011-09-22 DIAGNOSIS — R634 Abnormal weight loss: Secondary | ICD-10-CM | POA: Diagnosis not present

## 2011-09-22 DIAGNOSIS — R630 Anorexia: Secondary | ICD-10-CM

## 2011-09-22 NOTE — Patient Instructions (Addendum)
Physician will contact you with results of ultrasound when completed

## 2011-09-22 NOTE — Progress Notes (Signed)
Presenting complaint;  Anorexia and weight loss. Followup for UC.  Subjective:  [name] 77 year old Caucasian male was there for scheduled visit accompanied by his wife. He was last seen on Jun 08, 2011 for  anorexia weight loss and pruritic rash. He previously has been evaluated by Dr. Cari Caraway beavers who is a dermatologist. No therapy has helped him. I talked to Dr. Gar Ponto over the phone it was decided to stop Asacol. Stopping this medication unfortunately did not help his rash or itching. He was begun on prednisone which he took for about 6 weeks and noted resolution of his itching pruritus and return of his appetite. However he has not gained any weight. He was seen in the [place] clinic and they recommended an ultrasound. Patient was begun on methotrexate 2 weeks ago by Dr. Sandford Craze but so far he has not seen any improvement in his rash and pruritus. His wife tells me a skin biopsy was negative for vasculitis. He remains with poor appetite. His weight has not changed in the last 4 months. He is having an an average of 2 stools per day. Stools are formed to semi-phones. He has urgency he denies melena or rectal bleeding. He has occasional cramps. He denies fever or chills. He denies nausea vomiting or upper abdominal pain. Patient's last colonoscopy was in July 2011 and not in November 2011 as recorded in previous note .it showed mild disease to sigmoid colon.   Current Medications: Current Outpatient Prescriptions  Medication Sig Dispense Refill  . atenolol (TENORMIN) 100 MG tablet TAKE 1/2 (HALF) TABLET  TWICE A DAY      . folic acid (FOLVITE) 1 MG tablet Take 1 mg by mouth daily.      . isosorbide mononitrate (IMDUR) 30 MG 24 hr tablet Take 1 tablet (30 mg total) by mouth daily.  90 tablet  3  . methotrexate (RHEUMATREX) 2.5 MG tablet 2.5 mg once a week. On Tuesday      . mirtazapine (REMERON) 15 MG tablet Take 15 mg by mouth at bedtime.      Marland Kitchen PROCTOSOL HC 2.5 % rectal cream  APPLY RECTALLY TO AFFECTED AREA TWICE DAILY  29 g  1  . Rivaroxaban (XARELTO) 20 MG TABS Take 20 mg by mouth daily.  90 tablet  2     Objective: Blood pressure 126/82, pulse 76, temperature 97.8 F (36.6 C), temperature source Oral, resp. rate 18, height 6' 1"  (1.854 m), weight 170 lb 9.6 oz (77.384 kg). Patient appears to be quite anxious that he is in no acute distress very  Conjunctiva is pink. Sclera is nonicteric Oropharyngeal mucosa is normal. No neck masses or thyromegaly noted. Cardiac exam with regular rhythm normal S1 and S2. No murmur or gallop noted. Lungs are clear to auscultation. Abdomen abdomen is scaphoid. Bowel sounds are normal. On palpation soft abdomen without organomegaly or masses.  No LE edema or clubbing noted. extremities are very thin   he has macro papular rash over upper and lower back and some over his extremities.  Labs/studies Results:  none available for review.  Assessment:  #1. Ulcerative colitis remains in remission. Skin rash and pruritus did not improve on stopping mesalamine. He was begun on methotrexate by Dr. Tarri Glenn. Although he is on low dose but it might help keep his UC in remission. His LFTs have been normal. Therefore pruritus and skin rash does not appear to be secondary to cholestasis. #2. Weight loss and anorexia.   Plan:  Hepatobiliary  ultrasound. If ultrasound is not covered will proceed with abdominopelvic CT.

## 2011-09-29 DIAGNOSIS — C44611 Basal cell carcinoma of skin of unspecified upper limb, including shoulder: Secondary | ICD-10-CM | POA: Diagnosis not present

## 2011-09-29 DIAGNOSIS — D485 Neoplasm of uncertain behavior of skin: Secondary | ICD-10-CM | POA: Diagnosis not present

## 2011-09-29 DIAGNOSIS — Z79899 Other long term (current) drug therapy: Secondary | ICD-10-CM | POA: Diagnosis not present

## 2011-09-29 DIAGNOSIS — L259 Unspecified contact dermatitis, unspecified cause: Secondary | ICD-10-CM | POA: Diagnosis not present

## 2011-10-01 ENCOUNTER — Telehealth (INDEPENDENT_AMBULATORY_CARE_PROVIDER_SITE_OTHER): Payer: Self-pay | Admitting: *Deleted

## 2011-10-01 ENCOUNTER — Encounter (INDEPENDENT_AMBULATORY_CARE_PROVIDER_SITE_OTHER): Payer: Self-pay | Admitting: Internal Medicine

## 2011-10-01 DIAGNOSIS — Z0189 Encounter for other specified special examinations: Secondary | ICD-10-CM | POA: Diagnosis not present

## 2011-10-01 NOTE — Telephone Encounter (Signed)
Patient to have diagnostic study preformed

## 2011-10-02 DIAGNOSIS — R634 Abnormal weight loss: Secondary | ICD-10-CM | POA: Diagnosis not present

## 2011-10-02 DIAGNOSIS — R630 Anorexia: Secondary | ICD-10-CM | POA: Diagnosis not present

## 2011-10-02 DIAGNOSIS — K5190 Ulcerative colitis, unspecified, without complications: Secondary | ICD-10-CM | POA: Diagnosis not present

## 2011-10-02 DIAGNOSIS — R911 Solitary pulmonary nodule: Secondary | ICD-10-CM | POA: Diagnosis not present

## 2011-10-02 DIAGNOSIS — K5289 Other specified noninfective gastroenteritis and colitis: Secondary | ICD-10-CM | POA: Diagnosis not present

## 2011-10-14 ENCOUNTER — Encounter (INDEPENDENT_AMBULATORY_CARE_PROVIDER_SITE_OTHER): Payer: Self-pay

## 2011-10-15 DIAGNOSIS — C44611 Basal cell carcinoma of skin of unspecified upper limb, including shoulder: Secondary | ICD-10-CM | POA: Diagnosis not present

## 2011-10-20 DIAGNOSIS — Z79899 Other long term (current) drug therapy: Secondary | ICD-10-CM | POA: Diagnosis not present

## 2011-10-20 DIAGNOSIS — L259 Unspecified contact dermatitis, unspecified cause: Secondary | ICD-10-CM | POA: Diagnosis not present

## 2011-10-20 DIAGNOSIS — Z85828 Personal history of other malignant neoplasm of skin: Secondary | ICD-10-CM | POA: Diagnosis not present

## 2011-10-29 DIAGNOSIS — R071 Chest pain on breathing: Secondary | ICD-10-CM | POA: Diagnosis not present

## 2011-10-29 DIAGNOSIS — F411 Generalized anxiety disorder: Secondary | ICD-10-CM | POA: Diagnosis not present

## 2011-11-05 ENCOUNTER — Ambulatory Visit (INDEPENDENT_AMBULATORY_CARE_PROVIDER_SITE_OTHER): Payer: Medicare Other | Admitting: *Deleted

## 2011-11-05 DIAGNOSIS — Z950 Presence of cardiac pacemaker: Secondary | ICD-10-CM | POA: Diagnosis not present

## 2011-11-05 DIAGNOSIS — I4891 Unspecified atrial fibrillation: Secondary | ICD-10-CM | POA: Diagnosis not present

## 2011-11-09 LAB — REMOTE PACEMAKER DEVICE
AL IMPEDENCE PM: 521 Ohm
AL THRESHOLD: 0.5 V
RV LEAD AMPLITUDE: 16 mv
RV LEAD IMPEDENCE PM: 446 Ohm
RV LEAD THRESHOLD: 0.875 V

## 2011-11-17 DIAGNOSIS — L259 Unspecified contact dermatitis, unspecified cause: Secondary | ICD-10-CM | POA: Diagnosis not present

## 2011-11-17 DIAGNOSIS — Z79899 Other long term (current) drug therapy: Secondary | ICD-10-CM | POA: Diagnosis not present

## 2011-11-17 DIAGNOSIS — Z85828 Personal history of other malignant neoplasm of skin: Secondary | ICD-10-CM | POA: Diagnosis not present

## 2011-11-18 ENCOUNTER — Telehealth: Payer: Self-pay | Admitting: Internal Medicine

## 2011-11-18 NOTE — Telephone Encounter (Signed)
Please return call to patient at [phone] regarding possible issues with remote transmission.

## 2011-11-18 NOTE — Telephone Encounter (Signed)
Transmission received, patient aware.

## 2011-12-04 ENCOUNTER — Encounter: Payer: Self-pay | Admitting: *Deleted

## 2011-12-07 ENCOUNTER — Ambulatory Visit (INDEPENDENT_AMBULATORY_CARE_PROVIDER_SITE_OTHER): Payer: PRIVATE HEALTH INSURANCE | Admitting: Internal Medicine

## 2011-12-09 DIAGNOSIS — Z79899 Other long term (current) drug therapy: Secondary | ICD-10-CM | POA: Diagnosis not present

## 2011-12-09 DIAGNOSIS — Z85828 Personal history of other malignant neoplasm of skin: Secondary | ICD-10-CM | POA: Diagnosis not present

## 2011-12-09 DIAGNOSIS — L259 Unspecified contact dermatitis, unspecified cause: Secondary | ICD-10-CM | POA: Diagnosis not present

## 2011-12-24 DIAGNOSIS — R5381 Other malaise: Secondary | ICD-10-CM | POA: Diagnosis not present

## 2011-12-24 DIAGNOSIS — Z0000 Encounter for general adult medical examination without abnormal findings: Secondary | ICD-10-CM | POA: Diagnosis not present

## 2011-12-24 DIAGNOSIS — L509 Urticaria, unspecified: Secondary | ICD-10-CM | POA: Diagnosis not present

## 2011-12-24 DIAGNOSIS — I10 Essential (primary) hypertension: Secondary | ICD-10-CM | POA: Diagnosis not present

## 2011-12-24 DIAGNOSIS — Z79899 Other long term (current) drug therapy: Secondary | ICD-10-CM | POA: Diagnosis not present

## 2011-12-24 DIAGNOSIS — I4891 Unspecified atrial fibrillation: Secondary | ICD-10-CM | POA: Diagnosis not present

## 2011-12-24 DIAGNOSIS — E7800 Pure hypercholesterolemia, unspecified: Secondary | ICD-10-CM | POA: Diagnosis not present

## 2011-12-24 DIAGNOSIS — M1990 Unspecified osteoarthritis, unspecified site: Secondary | ICD-10-CM | POA: Diagnosis not present

## 2011-12-24 DIAGNOSIS — IMO0001 Reserved for inherently not codable concepts without codable children: Secondary | ICD-10-CM | POA: Diagnosis not present

## 2012-01-01 DIAGNOSIS — M1710 Unilateral primary osteoarthritis, unspecified knee: Secondary | ICD-10-CM | POA: Diagnosis not present

## 2012-01-13 ENCOUNTER — Ambulatory Visit (INDEPENDENT_AMBULATORY_CARE_PROVIDER_SITE_OTHER): Payer: Medicare Other | Admitting: Internal Medicine

## 2012-01-13 ENCOUNTER — Encounter: Payer: Self-pay | Admitting: Internal Medicine

## 2012-01-13 VITALS — BP 108/78 | HR 68 | Ht 73.5 in | Wt 162.4 lb

## 2012-01-13 DIAGNOSIS — I10 Essential (primary) hypertension: Secondary | ICD-10-CM | POA: Diagnosis not present

## 2012-01-13 DIAGNOSIS — I4891 Unspecified atrial fibrillation: Secondary | ICD-10-CM | POA: Diagnosis not present

## 2012-01-13 DIAGNOSIS — Z950 Presence of cardiac pacemaker: Secondary | ICD-10-CM | POA: Diagnosis not present

## 2012-01-13 DIAGNOSIS — I495 Sick sinus syndrome: Secondary | ICD-10-CM

## 2012-01-13 LAB — PACEMAKER DEVICE OBSERVATION
AL IMPEDENCE PM: 563 Ohm
ATRIAL PACING PM: 86.7
BATTERY VOLTAGE: 2.76 V
RV LEAD IMPEDENCE PM: 443 Ohm
VENTRICULAR PACING PM: 0.7

## 2012-01-13 MED ORDER — DRONEDARONE HCL 400 MG PO TABS: 400.0000 mg | ORAL_TABLET | Freq: Two times a day (BID) | ORAL | Status: DC

## 2012-01-13 NOTE — Patient Instructions (Addendum)
Your physician recommends that you schedule a follow-up appointment in: 3 months with Dr [name]  Your physician has recommended you make the following change in your medication:  1) Start Multaq 480m twice daily

## 2012-01-13 NOTE — Progress Notes (Signed)
HPI [name] returns today for followup. He is a 77 year old man with a history of coronary disease, symptomatic bradycardia, paroxysmal atrial fibrillation, status post permanent pacemaker insertion. In the interim, he has been bothered by pruritus. He is better. He notes that his palpitations have increased in frequency and severity. No syncope, no chest pain, no shortness of breath. Her aneurysm we'll Allergies  Allergen Reactions  . Nsaids     colitis     Current Outpatient Prescriptions  Medication Sig Dispense Refill  . atenolol (TENORMIN) 100 MG tablet Take 100 mg by mouth 2 (two) times daily.       Marland Kitchen FLUoxetine (PROZAC) 20 MG capsule Take 20 mg by mouth daily.       . folic acid (FOLVITE) 1 MG tablet Take 1 mg by mouth daily.      . isosorbide mononitrate (IMDUR) 30 MG 24 hr tablet Take 1 tablet (30 mg total) by mouth daily.  90 tablet  3  . ketoconazole (NIZORAL) 2 % shampoo Apply 1 application topically as needed.       . methotrexate (RHEUMATREX) 2.5 MG tablet Take 5 mg by mouth once a week. On Tuesday      . mometasone (ELOCON) 0.1 % ointment Apply 1 application topically as needed.       . montelukast (SINGULAIR) 10 MG tablet Take 10 mg by mouth daily.       Marland Kitchen PROCTOSOL HC 2.5 % rectal cream APPLY RECTALLY TO AFFECTED AREA TWICE DAILY  29 g  1  . Rivaroxaban (XARELTO) 20 MG TABS Take 20 mg by mouth daily.  90 tablet  2     Past Medical History  Diagnosis Date  . Hypertension   . Dyslipidemia   . Atrial fibrillation   . CAD (coronary artery disease)     a.  cath 5/12: mLAD 50% then tandem 50%, dLAD 30%; D1 30%; OM1 occluded with collateral flow; pRCA 25%; EF 55% with inf HK;  occluded OM likely cause of perfusion defect on Myoview  . Tachy-brady syndrome     s/p pacer  . Ulcerative colitis   . Dizziness   . Nonspecific abnormal unspecified cardiovascular function study   . GERD (gastroesophageal reflux disease)     ROS:   All systems reviewed and negative except  as noted in the HPI.   Past Surgical History  Procedure Date  . Implantation of a medtronic dual chamber pacemaker 02/01/06    [name] [name] MD  . Left knee arthroscopy with debridement 04/23/2006    [name] M.D  . Left knee total knee  replacement 05/13/07    [name] M.D.  . Knee surgery   . Back surgery   . Lumbar spine surgery   . Insert / replace / remove pacemaker     MEDTRONIC....PPM Serial Number:  [id number].....Marland KitchenPPM Model Number:  [id number]....Marland KitchenPPM DOI:  02/01/2006    . Cardiac catheterization 09/20/10    EF 55%.....Dr. Percival Spanish     Family History  Problem Relation Age of Onset  . Lung disease Father     Emphysema  . Cancer Mother   . Hypertension Mother   . Liver cancer Mother   . Pernicious anemia Mother   . Healthy Sister   . Healthy Daughter   . Healthy Daughter   . Healthy Son      History   Social History  . Marital Status: Married    Spouse Name: N/A    Number of  Children: 3  . Years of Education: N/A   Occupational History  . retired    Social History Main Topics  . Smoking status: Former Smoker -- 1.0 packs/day for 15 years    Types: Cigarettes    Quit date: 05/11/1978  . Smokeless tobacco: Never Used  . Alcohol Use: No  . Drug Use: No  . Sexually Active: Not on file   Other Topics Concern  . Not on file   Social History Narrative  . No narrative on file     BP 108/78  Pulse 68  Ht 6' 1.5" (1.867 m)  Wt 162 lb 6.4 oz (73.664 kg)  BMI 21.14 kg/m2  Physical Exam:  Well appearing 77 year old man, NAD HEENT: Unremarkable Neck:  No JVD, no thyromegally Lungs:  Clear with no wheezes, rales, or rhonchi. HEART:  Regular rate rhythm, no murmurs, no rubs, no clicks Abd:  soft, positive bowel sounds, no organomegally, no rebound, no guarding Ext:  2 plus pulses, no edema, no cyanosis, no clubbing Skin:  No rashes no nodules Neuro:  CN II through XII intact, motor grossly intact  DEVICE  Normal device function.   See PaceArt for details.   Assess/Plan:

## 2012-01-13 NOTE — Assessment & Plan Note (Signed)
His blood pressure is fairly well controlled. He is instructed to maintain a low-sodium diet. He did not tolerate metoprolol. He'll continue atenolol 100 mg twice a day.

## 2012-01-13 NOTE — Assessment & Plan Note (Signed)
His device is working normally was approximately 4 years of battery longevity. We'll follow.

## 2012-01-13 NOTE — Assessment & Plan Note (Signed)
His symptoms have increased in frequency and severity. We discussed the treatment options with the patient. With his history of coronary disease, or treatment options are somewhat limited. I would like to try multaq. We'll see if we can find some samples for the patient. If not, we'll try low-dose amiodarone.

## 2012-01-15 ENCOUNTER — Encounter (INDEPENDENT_AMBULATORY_CARE_PROVIDER_SITE_OTHER): Payer: Self-pay | Admitting: *Deleted

## 2012-01-15 ENCOUNTER — Telehealth: Payer: Self-pay | Admitting: *Deleted

## 2012-01-15 DIAGNOSIS — I4891 Unspecified atrial fibrillation: Secondary | ICD-10-CM

## 2012-01-15 MED ORDER — DRONEDARONE HCL 400 MG PO TABS: 400.0000 mg | ORAL_TABLET | Freq: Two times a day (BID) | ORAL | Status: DC

## 2012-01-15 NOTE — Telephone Encounter (Signed)
Wife called and requests samples that drug rep was supposed to leave. Nurse advised them that none left but office could give from what we had available. Nurse informed patient that samples would be left upfront.

## 2012-01-19 ENCOUNTER — Ambulatory Visit (INDEPENDENT_AMBULATORY_CARE_PROVIDER_SITE_OTHER): Payer: Medicare Other | Admitting: Internal Medicine

## 2012-01-19 ENCOUNTER — Encounter (INDEPENDENT_AMBULATORY_CARE_PROVIDER_SITE_OTHER): Payer: Self-pay | Admitting: Internal Medicine

## 2012-01-19 VITALS — BP 110/70 | HR 74 | Temp 97.2°F | Resp 20 | Ht 73.0 in | Wt 162.7 lb

## 2012-01-19 DIAGNOSIS — K5190 Ulcerative colitis, unspecified, without complications: Secondary | ICD-10-CM

## 2012-01-19 DIAGNOSIS — F411 Generalized anxiety disorder: Secondary | ICD-10-CM

## 2012-01-19 DIAGNOSIS — Z85828 Personal history of other malignant neoplasm of skin: Secondary | ICD-10-CM | POA: Diagnosis not present

## 2012-01-19 DIAGNOSIS — F419 Anxiety disorder, unspecified: Secondary | ICD-10-CM | POA: Insufficient documentation

## 2012-01-19 DIAGNOSIS — R634 Abnormal weight loss: Secondary | ICD-10-CM | POA: Diagnosis not present

## 2012-01-19 DIAGNOSIS — L259 Unspecified contact dermatitis, unspecified cause: Secondary | ICD-10-CM | POA: Diagnosis not present

## 2012-01-19 DIAGNOSIS — Z79899 Other long term (current) drug therapy: Secondary | ICD-10-CM | POA: Diagnosis not present

## 2012-01-19 MED ORDER — LORAZEPAM 0.5 MG PO TABS: 0.5000 mg | ORAL_TABLET | Freq: Three times a day (TID) | ORAL | Status: AC | PRN

## 2012-01-19 NOTE — Progress Notes (Signed)
Presenting complaint;  Followup for ulcerative colitis and weight loss.  Subjective:  Patient is 77 year old Caucasian male with history of ulcerative colitis who is in for scheduled visit. He is accompanied by his wife today. He was last seen on 09/22/2011 and draped 171 pounds. Today he is down to 162.7 pounds. He believes he has a good appetite and eats enough. He is having no bowel problems. He generally has 2 formed stools per day. He does not even remember the last time he passed blood per rectum. He also denies abdominal pain or melena. He complains of feeling nervous. He complains of feeling of trembling in his chest and abdomen. This symptom is not associated with shortness of breath or diaphoreses. He was recently begun on Prozac by Dr. Gar Ponto. He states his itching has improved a whole lot since he was begun on methotrexate by Dr. Sandford Craze. He still does not know or remember diagnosis. He has an appointment with Dr. Tarri Glenn today. He also saw his pulmonologist Dr. Marcello Moores a meal of [facility]. Dr. Judy Pimple notes indicates that he does not have pulmonary asbestosis. Patient did take copy of abdominopelvic CT for his review but hasn't heard from him.  Current Medications: Current Outpatient Prescriptions  Medication Sig Dispense Refill  . atenolol (TENORMIN) 100 MG tablet Take 100 mg by mouth. Patient is taking 1/2 tablet twice a day      . FLUoxetine (PROZAC) 20 MG capsule Take 20 mg by mouth daily.       . folic acid (FOLVITE) 1 MG tablet Take 1 mg by mouth daily.      . isosorbide mononitrate (IMDUR) 30 MG 24 hr tablet Take 1 tablet (30 mg total) by mouth daily.  90 tablet  3  . ketoconazole (NIZORAL) 2 % shampoo Apply 1 application topically as needed.       . methotrexate (RHEUMATREX) 2.5 MG tablet Take 5 mg by mouth once a week. On Tuesday      . mometasone (ELOCON) 0.1 % ointment Apply 1 application topically as needed.       . montelukast (SINGULAIR) 10 MG  tablet Take 10 mg by mouth daily.       Marland Kitchen PROCTOSOL HC 2.5 % rectal cream APPLY RECTALLY TO AFFECTED AREA TWICE DAILY  29 g  1  . Rivaroxaban (XARELTO) 20 MG TABS Take 20 mg by mouth daily.  90 tablet  2  . dronedarone (MULTAQ) 400 MG tablet Take 1 tablet (400 mg total) by mouth 2 (two) times daily with a meal.  12 tablet  0     Objective: Blood pressure 110/70, pulse 74, temperature 97.2 F (36.2 C), temperature source Oral, resp. rate 20, height 6' 1"  (1.854 m), weight 162 lb 11.2 oz (73.8 kg). Pleasant well developed thin Caucasian male who appears to be quite anxious. Conjunctiva is pink. Sclera is nonicteric Oropharyngeal mucosa is normal. No neck masses or thyromegaly noted. Abdomen is flat and soft without tenderness organomegaly or masses.  No LE edema or clubbing noted.   Assessment:  #1. Ulcerative colitis. He is in remission. He is on methotrexate primarily for skin disease it should also treat his UC. As long as he is on methotrexate he does not need to be on another medication for UC. #2. Weight loss. Etiology unclear. I am concerned that his oral intake is not adequate. He was found to have a 5 mm lung nodule another small subpleural nodule. His pulmonologist is reviewing his abdominopelvic CT  scan to determine need for chest CT. Recent chest film was unremarkable according to patient and his wife. #3. Anxiety. He is on Prozac which did help depression but may not treat his anxiety. Therefore will start him on lorazepam. I would like Dr. Gar Ponto to further manage his anxiolytic or change it as he sees appropriate.   Plan:  Lorazepam oh 0.25 mg twice a day when necessary and 0.5 mg by mouth each bedtime. He will stop by the office for weight check in 6-8 weeks. Office visit in 6 months.

## 2012-01-19 NOTE — Patient Instructions (Addendum)
Lorazepam half a tablet or 0.25 mg twice daily as needed and 1 tablet or 0.5 mg at bedtime. Weight check in 6-8 week.

## 2012-01-19 NOTE — Telephone Encounter (Signed)
Called and informed patient via message machine that samples he was looking for, came to our office yesterday and they can come to office and pick them up.

## 2012-01-20 ENCOUNTER — Encounter: Payer: Self-pay | Admitting: Internal Medicine

## 2012-01-21 DIAGNOSIS — R5383 Other fatigue: Secondary | ICD-10-CM | POA: Diagnosis not present

## 2012-01-21 DIAGNOSIS — I4891 Unspecified atrial fibrillation: Secondary | ICD-10-CM | POA: Diagnosis not present

## 2012-01-21 DIAGNOSIS — I10 Essential (primary) hypertension: Secondary | ICD-10-CM | POA: Diagnosis not present

## 2012-01-21 DIAGNOSIS — M1990 Unspecified osteoarthritis, unspecified site: Secondary | ICD-10-CM | POA: Diagnosis not present

## 2012-01-21 DIAGNOSIS — R21 Rash and other nonspecific skin eruption: Secondary | ICD-10-CM | POA: Diagnosis not present

## 2012-01-21 DIAGNOSIS — E782 Mixed hyperlipidemia: Secondary | ICD-10-CM | POA: Diagnosis not present

## 2012-02-09 DIAGNOSIS — L0100 Impetigo, unspecified: Secondary | ICD-10-CM | POA: Diagnosis not present

## 2012-02-09 DIAGNOSIS — D485 Neoplasm of uncertain behavior of skin: Secondary | ICD-10-CM | POA: Diagnosis not present

## 2012-02-09 DIAGNOSIS — L980 Pyogenic granuloma: Secondary | ICD-10-CM | POA: Diagnosis not present

## 2012-02-23 DIAGNOSIS — Z23 Encounter for immunization: Secondary | ICD-10-CM | POA: Diagnosis not present

## 2012-03-01 DIAGNOSIS — H2510 Age-related nuclear cataract, unspecified eye: Secondary | ICD-10-CM | POA: Diagnosis not present

## 2012-03-18 ENCOUNTER — Telehealth: Payer: Self-pay | Admitting: Internal Medicine

## 2012-03-18 NOTE — Telephone Encounter (Signed)
Going to pick up samples at the [facility]

## 2012-03-18 NOTE — Telephone Encounter (Signed)
plz return call to pt wife [phone] to discuss medication.

## 2012-04-12 DIAGNOSIS — D0420 Carcinoma in situ of skin of unspecified ear and external auricular canal: Secondary | ICD-10-CM | POA: Diagnosis not present

## 2012-04-12 DIAGNOSIS — L259 Unspecified contact dermatitis, unspecified cause: Secondary | ICD-10-CM | POA: Diagnosis not present

## 2012-04-12 DIAGNOSIS — C44621 Squamous cell carcinoma of skin of unspecified upper limb, including shoulder: Secondary | ICD-10-CM | POA: Diagnosis not present

## 2012-04-12 DIAGNOSIS — L570 Actinic keratosis: Secondary | ICD-10-CM | POA: Diagnosis not present

## 2012-04-12 DIAGNOSIS — D485 Neoplasm of uncertain behavior of skin: Secondary | ICD-10-CM | POA: Diagnosis not present

## 2012-04-12 DIAGNOSIS — Z79899 Other long term (current) drug therapy: Secondary | ICD-10-CM | POA: Diagnosis not present

## 2012-04-14 ENCOUNTER — Encounter: Payer: Self-pay | Admitting: *Deleted

## 2012-04-19 ENCOUNTER — Ambulatory Visit (INDEPENDENT_AMBULATORY_CARE_PROVIDER_SITE_OTHER): Payer: Medicare Other | Admitting: Internal Medicine

## 2012-04-19 ENCOUNTER — Encounter: Payer: Self-pay | Admitting: Internal Medicine

## 2012-04-19 VITALS — BP 115/69 | HR 71 | Resp 18 | Ht 73.0 in | Wt 168.0 lb

## 2012-04-19 DIAGNOSIS — I495 Sick sinus syndrome: Secondary | ICD-10-CM

## 2012-04-19 DIAGNOSIS — I4891 Unspecified atrial fibrillation: Secondary | ICD-10-CM | POA: Diagnosis not present

## 2012-04-19 DIAGNOSIS — Z950 Presence of cardiac pacemaker: Secondary | ICD-10-CM | POA: Diagnosis not present

## 2012-04-19 LAB — PACEMAKER DEVICE OBSERVATION
AL AMPLITUDE: 5.6 mv
BAMS-0001: 175 {beats}/min
RV LEAD AMPLITUDE: 8 mv
RV LEAD THRESHOLD: 0.75 V

## 2012-04-19 NOTE — Patient Instructions (Addendum)
Your physician recommends that you schedule a follow-up appointment in: 12 weeks with Dr [name]   Call Claib[facility] Billings [phone] and give me Dr Kathalene Frames numbers    Remote monitoring is used to monitor your Pacemaker of ICD from home. This monitoring reduces the number of office visits required to check your device to one time per year. It allows [place] to keep an eye on the functioning of your device to ensure it is working properly. You are scheduled for a device check from home on 07/25/12. You may send your transmission at any time that day. If you have a wireless device, the transmission will be sent automatically. After your physician reviews your transmission, you will receive a postcard with your next transmission date.   Your physician has recommended you make the following change in your medication: 1) Stop Multaq 2) Start Amiodarone 217m daily--will start after we talk to Dr  OAudie Box

## 2012-04-19 NOTE — Assessment & Plan Note (Signed)
His permanent dual-chamber pacemaker, Medtronic, is working normally. We'll plan to recheck in several months.

## 2012-04-19 NOTE — Assessment & Plan Note (Signed)
I discussed the treatment options with the patient and his wife. Continuing his current antiarrhythmic drug therapy would not be warranted as his atrial fibrillation burden has not been reduced significantly. One option for therapy would be inpatient admission for dofetilide. Another option would be initiation of amiodarone. The patient is not inclined to be admitted to the hospital. I am a bit concerned about the degree of his COPD. Prior to starting amiodarone, I would like to obtain his most recent pulmonary function test from Dr. Farris Has in [place]. Finally, he'll continue his current anticoagulation regimen.

## 2012-04-19 NOTE — Progress Notes (Signed)
HPI [name] returns today for followup. He is a very pleasant 77 year old man with coronary artery disease, symptomatic tachycardia bradycardia syndrome, status post permanent pacemaker insertion, paroxysmal/persistent atrial fibrillation, and hypertension. He was initially placed on multaq but reinterrogation of his pacemaker demonstrates that his atrial fibrillation burden has not improved. He denies anginal symptoms. He does have a history of asbestos exposure and remote tobacco abuse. He is followed in the pulmonary clinic in [place]. I do not have those records. He does not have a cough. He denies chest pain. His palpitations are fairly mild. Allergies  Allergen Reactions  . Nsaids     colitis     Current Outpatient Prescriptions  Medication Sig Dispense Refill  . atenolol (TENORMIN) 100 MG tablet Take 100 mg by mouth. Patient is taking 1/2 tablet twice a day      . folic acid (FOLVITE) 1 MG tablet Take 1 mg by mouth daily.      . isosorbide mononitrate (IMDUR) 30 MG 24 hr tablet Take 1 tablet (30 mg total) by mouth daily.  90 tablet  3  . ketoconazole (NIZORAL) 2 % shampoo Apply 1 application topically as needed.       . methotrexate (RHEUMATREX) 2.5 MG tablet Take 15 mg by mouth once a week. On Tuesday      . mometasone (ELOCON) 0.1 % ointment Apply 1 application topically as needed.       . Multiple Vitamin (MULTIVITAMIN WITH MINERALS) TABS Take 1 tablet by mouth daily.      Marland Kitchen PROCTOSOL HC 2.5 % rectal cream APPLY RECTALLY TO AFFECTED AREA TWICE DAILY  29 g  1  . Rivaroxaban (XARELTO) 20 MG TABS Take 20 mg by mouth daily.  90 tablet  2     Past Medical History  Diagnosis Date  . Hypertension   . Dyslipidemia   . Atrial fibrillation   . CAD (coronary artery disease)     a.  cath 5/12: mLAD 50% then tandem 50%, dLAD 30%; D1 30%; OM1 occluded with collateral flow; pRCA 25%; EF 55% with inf HK;  occluded OM likely cause of perfusion defect on Myoview  . Tachy-brady  syndrome     s/p pacer  . Ulcerative colitis   . Dizziness   . Nonspecific abnormal unspecified cardiovascular function study   . GERD (gastroesophageal reflux disease)     ROS:   All systems reviewed and negative except as noted in the HPI.   Past Surgical History  Procedure Date  . Implantation of a medtronic dual chamber pacemaker 02/01/06    [name] [name] MD  . Left knee arthroscopy with debridement 04/23/2006    [name] M.D  . Left knee total knee  replacement 05/13/07    [name] M.D.  . Knee surgery   . Back surgery   . Lumbar spine surgery   . Insert / replace / remove pacemaker     MEDTRONIC....PPM Serial Number:  [id number].....Marland KitchenPPM Model Number:  [id number]....Marland KitchenPPM DOI:  02/01/2006    . Cardiac catheterization 09/20/10    EF 55%.....Dr. Percival Spanish     Family History  Problem Relation Age of Onset  . Lung disease Father     Emphysema  . Cancer Mother   . Hypertension Mother   . Liver cancer Mother   . Pernicious anemia Mother   . Healthy Sister   . Healthy Daughter   . Healthy Daughter   . Healthy Son  History   Social History  . Marital Status: Married    Spouse Name: N/A    Number of Children: 3  . Years of Education: N/A   Occupational History  . retired    Social History Main Topics  . Smoking status: Former Smoker -- 1.0 packs/day for 15 years    Types: Cigarettes    Quit date: 05/11/1978  . Smokeless tobacco: Never Used  . Alcohol Use: No  . Drug Use: No  . Sexually Active: Not on file   Other Topics Concern  . Not on file   Social History Narrative  . No narrative on file     BP 115/69  Pulse 71  Resp 18  Ht 6' 1"  (1.854 m)  Wt 168 lb (76.204 kg)  BMI 22.16 kg/m2  SpO2 93%  Physical Exam:  Well appearing 77 year old man, NAD HEENT: Unremarkable Neck:  No JVD, no thyromegally Lungs:  Clear with no wheezes, rales, or rhonchi. HEART:  Regular rate rhythm, no murmurs, no rubs, no clicks Abd:  soft,  positive bowel sounds, no organomegally, no rebound, no guarding Ext:  2 plus pulses, no edema, no cyanosis, no clubbing Skin:  No rashes no nodules Neuro:  CN II through XII intact, motor grossly intact  EKG sinus bradycardia with atrial pacing  DEVICE  Normal device function.  See PaceArt for details.   Assess/Plan:

## 2012-04-19 NOTE — Assessment & Plan Note (Signed)
His ventricular rate appears to be fairly well-controlled. He will continue his current medical therapy.

## 2012-04-20 ENCOUNTER — Telehealth: Payer: Self-pay | Admitting: Internal Medicine

## 2012-04-20 NOTE — Telephone Encounter (Signed)
Pt wife [phone] called to leave information   Pt doctor is Dr. Gwen Her. O'neill. [phone] ([facility] of [place], [place]) regarding patient lung condition.

## 2012-04-22 NOTE — Telephone Encounter (Signed)
Discussed state of [name] PFT's with his pulmonologist Dr. Davina Poke in [place]. He has only mild lung disease and a normal DLCO and I will start low dose amio 200 mg daily, and ask the patient to repeat his DLCO in 6 months.

## 2012-04-25 ENCOUNTER — Telehealth (INDEPENDENT_AMBULATORY_CARE_PROVIDER_SITE_OTHER): Payer: Self-pay | Admitting: *Deleted

## 2012-04-25 NOTE — Telephone Encounter (Signed)
Weight check on 04/25/12.Marland Kitchen

## 2012-04-25 NOTE — Telephone Encounter (Signed)
[name] presented to the office today for a weight check. He weighed 165.8 lbs On 01/19/12  He weighed 162 lbs 11.2 oz

## 2012-05-01 NOTE — Telephone Encounter (Signed)
I talked with Ms. [name]. Patient is feeling fine. He has has gained few pounds since his last visit. He is scheduled for followup CT by Dr. Marisue Ivan. She'll make sure we get a copy of that CT report Patient has office visit in March 2014

## 2012-05-02 DIAGNOSIS — C44221 Squamous cell carcinoma of skin of unspecified ear and external auricular canal: Secondary | ICD-10-CM | POA: Diagnosis not present

## 2012-05-02 DIAGNOSIS — C44621 Squamous cell carcinoma of skin of unspecified upper limb, including shoulder: Secondary | ICD-10-CM | POA: Diagnosis not present

## 2012-05-02 MED ORDER — AMIODARONE HCL 200 MG PO TABS: 200.0000 mg | ORAL_TABLET | Freq: Every day | ORAL | Status: DC

## 2012-05-02 NOTE — Addendum Note (Signed)
Addended by: Janan Halter F on: 05/02/2012 03:48 PM   Modules accepted: Orders

## 2012-05-06 NOTE — Addendum Note (Signed)
Addended by: Lubertha Basque A on: 05/06/2012 04:58 PM   Modules accepted: Orders

## 2012-05-16 DIAGNOSIS — J111 Influenza due to unidentified influenza virus with other respiratory manifestations: Secondary | ICD-10-CM | POA: Diagnosis not present

## 2012-05-23 DIAGNOSIS — I4891 Unspecified atrial fibrillation: Secondary | ICD-10-CM | POA: Diagnosis not present

## 2012-05-23 DIAGNOSIS — I10 Essential (primary) hypertension: Secondary | ICD-10-CM | POA: Diagnosis not present

## 2012-05-23 DIAGNOSIS — E782 Mixed hyperlipidemia: Secondary | ICD-10-CM | POA: Diagnosis not present

## 2012-05-24 DIAGNOSIS — E782 Mixed hyperlipidemia: Secondary | ICD-10-CM | POA: Diagnosis not present

## 2012-05-24 DIAGNOSIS — R5383 Other fatigue: Secondary | ICD-10-CM | POA: Diagnosis not present

## 2012-05-24 DIAGNOSIS — J309 Allergic rhinitis, unspecified: Secondary | ICD-10-CM | POA: Diagnosis not present

## 2012-05-24 DIAGNOSIS — M1990 Unspecified osteoarthritis, unspecified site: Secondary | ICD-10-CM | POA: Diagnosis not present

## 2012-05-24 DIAGNOSIS — R5381 Other malaise: Secondary | ICD-10-CM | POA: Diagnosis not present

## 2012-05-24 DIAGNOSIS — I10 Essential (primary) hypertension: Secondary | ICD-10-CM | POA: Diagnosis not present

## 2012-05-24 DIAGNOSIS — I4891 Unspecified atrial fibrillation: Secondary | ICD-10-CM | POA: Diagnosis not present

## 2012-05-27 NOTE — Addendum Note (Signed)
Addended by: Laurence Compton on: 05/27/2012 04:24 PM   Modules accepted: Orders

## 2012-06-02 NOTE — Addendum Note (Signed)
Addended by: Laurence Compton on: 06/02/2012 01:13 PM   Modules accepted: Orders

## 2012-06-29 ENCOUNTER — Other Ambulatory Visit: Payer: Self-pay | Admitting: Cardiology

## 2012-06-29 MED ORDER — ISOSORBIDE MONONITRATE ER 30 MG PO TB24: 30.0000 mg | ORAL_TABLET | Freq: Every day | ORAL | Status: DC

## 2012-06-30 DIAGNOSIS — J40 Bronchitis, not specified as acute or chronic: Secondary | ICD-10-CM | POA: Diagnosis not present

## 2012-06-30 DIAGNOSIS — R5381 Other malaise: Secondary | ICD-10-CM | POA: Diagnosis not present

## 2012-06-30 DIAGNOSIS — R05 Cough: Secondary | ICD-10-CM | POA: Diagnosis not present

## 2012-07-12 DIAGNOSIS — Z79899 Other long term (current) drug therapy: Secondary | ICD-10-CM | POA: Diagnosis not present

## 2012-07-12 DIAGNOSIS — L570 Actinic keratosis: Secondary | ICD-10-CM | POA: Diagnosis not present

## 2012-07-12 DIAGNOSIS — Z85828 Personal history of other malignant neoplasm of skin: Secondary | ICD-10-CM | POA: Diagnosis not present

## 2012-07-12 DIAGNOSIS — L259 Unspecified contact dermatitis, unspecified cause: Secondary | ICD-10-CM | POA: Diagnosis not present

## 2012-07-19 ENCOUNTER — Ambulatory Visit (INDEPENDENT_AMBULATORY_CARE_PROVIDER_SITE_OTHER): Payer: Medicare Other | Admitting: Internal Medicine

## 2012-07-19 ENCOUNTER — Encounter: Payer: Medicare Other | Admitting: Internal Medicine

## 2012-07-19 ENCOUNTER — Encounter (INDEPENDENT_AMBULATORY_CARE_PROVIDER_SITE_OTHER): Payer: Self-pay | Admitting: Internal Medicine

## 2012-07-19 VITALS — BP 110/70 | HR 74 | Temp 98.1°F | Resp 20 | Ht 73.0 in | Wt 162.6 lb

## 2012-07-19 DIAGNOSIS — R634 Abnormal weight loss: Secondary | ICD-10-CM

## 2012-07-19 DIAGNOSIS — K5190 Ulcerative colitis, unspecified, without complications: Secondary | ICD-10-CM

## 2012-07-19 NOTE — Progress Notes (Signed)
Presenting complaint;  Followup for ulcerative colitis and weight loss..  Subjective:  Patient is 78 year old Caucasian male who presents for scheduled visit accompanied by his wife. He was last seen in September 2013. Back in January he got sick with flu and was seen by PCP on 5 different occasions. He is finally feeling better. He is having intermittent epistaxis. He had chest CT 3 weeks ago for followup lung nodules and he is to follow up with this pulmonologist later this month. He hasn't gained or lost any weight since his last visit. He does not have a good appetite but he tries to eat 3 meals a day. He denies nausea vomiting heartburn dysphagia abdominal pain or diarrhea. He generally has one formed stool a day. His pruritus has improved a great deal. He is getting periodic blood work since he is on methotrexate primarily for skin disorder.  Current Medications: Current Outpatient Prescriptions  Medication Sig Dispense Refill  . amiodarone (PACERONE) 200 MG tablet Take 1 tablet (200 mg total) by mouth daily.  90 tablet  3  . atenolol (TENORMIN) 100 MG tablet Take 100 mg by mouth. Patient is taking 1/2 tablet twice a day      . folic acid (FOLVITE) 1 MG tablet Take 1 mg by mouth daily.      . isosorbide mononitrate (IMDUR) 30 MG 24 hr tablet Take 1 tablet (30 mg total) by mouth daily.  90 tablet  3  . ketoconazole (NIZORAL) 2 % shampoo Apply 1 application topically as needed.       . methotrexate (RHEUMATREX) 2.5 MG tablet Take 15 mg by mouth once a week. On Tuesday      . mometasone (ELOCON) 0.1 % ointment Apply 1 application topically as needed.       . Multiple Vitamin (MULTIVITAMIN WITH MINERALS) TABS Take 1 tablet by mouth daily.      . Rivaroxaban (XARELTO) 20 MG TABS Take 20 mg by mouth daily.  90 tablet  2  . PROCTOSOL HC 2.5 % rectal cream APPLY RECTALLY TO AFFECTED AREA TWICE DAILY  29 g  1   No current facility-administered medications for this visit.     Objective: Blood  pressure 110/70, pulse 74, temperature 98.1 F (36.7 C), temperature source Oral, resp. rate 20, height 6' 1"  (1.854 m), weight 162 lb 9.6 oz (73.755 kg). Conjunctiva is pink. Sclera is nonicteric. Conjunctiva is pink. Sclera is nonicteric Oropharyngeal mucosa is normal. No neck masses or thyromegaly noted. Abdomen is flat and soft without tenderness organomegaly or masses. No LE edema or clubbing noted.   Assessment:  #1. Ulcerative colitis. He remains in remission. He is on methotrexate which was primarily begun for skin disorder by Dr. Sandford Craze. This medication is also controlling UC. #2. Weight loss. Weight has been stable since last visit. Anorexia most likely secondary to medications. Will consider treatment if weight drops further.   Plan:  Patient to call office if he develops diarrhea or rectal bleeding. Unless he has problems he'll return for office visit in one year. He will call Dr. Coralyn Mark Daniel's office if epistaxis does not resolve in the next 2-3 weeks.

## 2012-07-19 NOTE — Patient Instructions (Signed)
Notify if you have rectal bleeding or diarrhea. Followup with Dr. Gar Ponto if nosebleed persists

## 2012-07-22 ENCOUNTER — Encounter (HOSPITAL_COMMUNITY): Payer: Self-pay | Admitting: *Deleted

## 2012-07-22 ENCOUNTER — Emergency Department (HOSPITAL_COMMUNITY)
Admission: EM | Admit: 2012-07-22 | Discharge: 2012-07-22 | Disposition: A | Discharge status: Home / Self Care | Payer: Medicare Other | Attending: Emergency Medicine | Admitting: Emergency Medicine

## 2012-07-22 ENCOUNTER — Emergency Department (HOSPITAL_COMMUNITY): Payer: Medicare Other

## 2012-07-22 DIAGNOSIS — Y929 Unspecified place or not applicable: Secondary | ICD-10-CM | POA: Insufficient documentation

## 2012-07-22 DIAGNOSIS — Z8639 Personal history of other endocrine, nutritional and metabolic disease: Secondary | ICD-10-CM | POA: Insufficient documentation

## 2012-07-22 DIAGNOSIS — R55 Syncope and collapse: Secondary | ICD-10-CM

## 2012-07-22 DIAGNOSIS — S0993XA Unspecified injury of face, initial encounter: Secondary | ICD-10-CM | POA: Insufficient documentation

## 2012-07-22 DIAGNOSIS — I10 Essential (primary) hypertension: Secondary | ICD-10-CM | POA: Diagnosis not present

## 2012-07-22 DIAGNOSIS — Y9389 Activity, other specified: Secondary | ICD-10-CM | POA: Insufficient documentation

## 2012-07-22 DIAGNOSIS — Z8719 Personal history of other diseases of the digestive system: Secondary | ICD-10-CM | POA: Diagnosis not present

## 2012-07-22 DIAGNOSIS — Z87891 Personal history of nicotine dependence: Secondary | ICD-10-CM | POA: Insufficient documentation

## 2012-07-22 DIAGNOSIS — Z79899 Other long term (current) drug therapy: Secondary | ICD-10-CM | POA: Insufficient documentation

## 2012-07-22 DIAGNOSIS — Z9861 Coronary angioplasty status: Secondary | ICD-10-CM | POA: Diagnosis not present

## 2012-07-22 DIAGNOSIS — S0990XA Unspecified injury of head, initial encounter: Secondary | ICD-10-CM | POA: Diagnosis not present

## 2012-07-22 DIAGNOSIS — Z8679 Personal history of other diseases of the circulatory system: Secondary | ICD-10-CM | POA: Diagnosis not present

## 2012-07-22 DIAGNOSIS — S199XXA Unspecified injury of neck, initial encounter: Secondary | ICD-10-CM | POA: Insufficient documentation

## 2012-07-22 DIAGNOSIS — I2510 Atherosclerotic heart disease of native coronary artery without angina pectoris: Secondary | ICD-10-CM | POA: Insufficient documentation

## 2012-07-22 DIAGNOSIS — IMO0002 Reserved for concepts with insufficient information to code with codable children: Secondary | ICD-10-CM | POA: Insufficient documentation

## 2012-07-22 DIAGNOSIS — Z950 Presence of cardiac pacemaker: Secondary | ICD-10-CM | POA: Diagnosis not present

## 2012-07-22 DIAGNOSIS — R404 Transient alteration of awareness: Secondary | ICD-10-CM | POA: Diagnosis not present

## 2012-07-22 DIAGNOSIS — Z862 Personal history of diseases of the blood and blood-forming organs and certain disorders involving the immune mechanism: Secondary | ICD-10-CM | POA: Insufficient documentation

## 2012-07-22 LAB — CBC WITH DIFFERENTIAL/PLATELET
HCT: 35.6 % — ABNORMAL LOW (ref 39.0–52.0)
Hemoglobin: 12.2 g/dL — ABNORMAL LOW (ref 13.0–17.0)
Lymphocytes Relative: 10 % — ABNORMAL LOW (ref 12–46)
MCHC: 34.3 g/dL (ref 30.0–36.0)
MCV: 93.4 fL (ref 78.0–100.0)
Monocytes Absolute: 1 10*3/uL (ref 0.1–1.0)
Monocytes Relative: 13 % — ABNORMAL HIGH (ref 3–12)
Neutro Abs: 6 10*3/uL (ref 1.7–7.7)
WBC: 7.9 10*3/uL (ref 4.0–10.5)

## 2012-07-22 LAB — COMPREHENSIVE METABOLIC PANEL
ALT: 27 U/L (ref 0–53)
Albumin: 3.8 g/dL (ref 3.5–5.2)
Calcium: 9.2 mg/dL (ref 8.4–10.5)
GFR calc Af Amer: 66 mL/min — ABNORMAL LOW (ref >90)
Glucose, Bld: 116 mg/dL — ABNORMAL HIGH (ref 70–99)
Sodium: 139 mEq/L (ref 135–145)
Total Protein: 6.9 g/dL (ref 6.0–8.3)

## 2012-07-22 LAB — POCT I-STAT TROPONIN I: Troponin i, poc: 0.01 ng/mL (ref 0.00–0.08)

## 2012-07-22 NOTE — ED Provider Notes (Addendum)
History     CSN: [id number]  Arrival date & time 07/22/12  1342   First MD Initiated Contact with Patient 07/22/12 1404      Chief Complaint  Patient presents with  . Loss of Consciousness    (Consider location/radiation/quality/duration/timing/severity/associated sxs/prior treatment) HPI Comments: [name] is a 78 y.o. Male who was walking, in [place], when he felt tired.  He walked to the end of, the I'll leaned over the [place], then collapsed. He was on the floor for just a few seconds then got up on his own. He injured his right eyebrow. There were no other injuries. He feels back to his baseline, on arrival to the ED. He denies recent illnesses. He has not had fever, chills, nausea, vomiting, weakness, or dizziness. There are no other modifying factors.  Patient is a 78 y.o. male presenting with syncope. The history is provided by the patient and the spouse.  Loss of Consciousness     Past Medical History  Diagnosis Date  . Hypertension   . Dyslipidemia   . Atrial fibrillation   . CAD (coronary artery disease)     a.  cath 5/12: mLAD 50% then tandem 50%, dLAD 30%; D1 30%; OM1 occluded with collateral flow; pRCA 25%; EF 55% with inf HK;  occluded OM likely cause of perfusion defect on Myoview  . Tachy-brady syndrome     s/p pacer  . Ulcerative colitis   . Dizziness   . Nonspecific abnormal unspecified cardiovascular function study   . GERD (gastroesophageal reflux disease)     Past Surgical History  Procedure Laterality Date  . Implantation of a medtronic dual chamber pacemaker  02/01/06    [name] [name] MD  . Left knee arthroscopy with debridement  04/23/2006    [name] M.D  . Left knee total knee  replacement  05/13/07    [name] M.D.  . Knee surgery    . Back surgery    . Lumbar spine surgery    . Insert / replace / remove pacemaker      MEDTRONIC....PPM Serial Number:  [id number].....Marland KitchenPPM Model Number:  [id number]....Marland KitchenPPM DOI:  02/01/2006     . Cardiac catheterization  09/20/10    EF 55%.....Dr. Percival Spanish    Family History  Problem Relation Age of Onset  . Lung disease Father     Emphysema  . Cancer Mother   . Hypertension Mother   . Liver cancer Mother   . Pernicious anemia Mother   . Healthy Sister   . Healthy Daughter   . Healthy Daughter   . Healthy Son     History  Substance Use Topics  . Smoking status: Former Smoker -- 1.00 packs/day for 15 years    Types: Cigarettes    Quit date: 05/11/1978  . Smokeless tobacco: Never Used  . Alcohol Use: No      Review of Systems  Cardiovascular: Positive for syncope.  All other systems reviewed and are negative.    Allergies  Nsaids  Home Medications   Current Outpatient Rx  Name  Route  Sig  Dispense  Refill  . amiodarone (PACERONE) 200 MG tablet   Oral   Take 1 tablet (200 mg total) by mouth daily.   90 tablet   3   . atenolol (TENORMIN) 100 MG tablet   Oral   Take 50 mg by mouth daily.          . cetirizine (ZYRTEC) 10 MG tablet  Oral   Take 10 mg by mouth daily.         Marland Kitchen dronedarone (MULTAQ) 400 MG tablet   Oral   Take 400 mg by mouth 2 (two) times daily with a meal.         . folic acid (FOLVITE) 1 MG tablet   Oral   Take 1 mg by mouth daily.         . isosorbide mononitrate (IMDUR) 30 MG 24 hr tablet   Oral   Take 1 tablet (30 mg total) by mouth daily.   90 tablet   3   . ketoconazole (NIZORAL) 2 % shampoo   Topical   Apply 1 application topically as needed for itching.          . methotrexate (RHEUMATREX) 2.5 MG tablet   Oral   Take 15 mg by mouth once a week. On Tuesday         . Multiple Vitamin (MULTIVITAMIN WITH MINERALS) TABS   Oral   Take 1 tablet by mouth daily. Centrum Silver         . Rivaroxaban (XARELTO) 20 MG TABS   Oral   Take 20 mg by mouth daily.   90 tablet   2   . vitamin C (ASCORBIC ACID) 500 MG tablet   Oral   Take 500 mg by mouth daily.           BP 130/76  Pulse 58   Temp(Src) 98.2 F (36.8 C) (Oral)  Resp 17  SpO2 99%  Physical Exam  Nursing note and vitals reviewed. Constitutional: He is oriented to person, place, and time. He appears well-developed and well-nourished.  HENT:  Head: Normocephalic and atraumatic.  Right Ear: External ear normal.  Left Ear: External ear normal.  Mild bruising, and superficial abrasions of the right lateral orbit. No associated crepitation.  Eyes: Conjunctivae and EOM are normal. Pupils are equal, round, and reactive to light.  Neck: Normal range of motion and phonation normal. Neck supple.  Cardiovascular: Normal rate, regular rhythm, normal heart sounds and intact distal pulses.   Pulmonary/Chest: Effort normal and breath sounds normal. He exhibits no bony tenderness.  Abdominal: Soft. Normal appearance. There is no tenderness.  Musculoskeletal: Normal range of motion.  Neurological: He is alert and oriented to person, place, and time. He has normal strength. No cranial nerve deficit or sensory deficit. He exhibits normal muscle tone. Coordination normal.  Skin: Skin is warm, dry and intact.  Psychiatric: He has a normal mood and affect. His behavior is normal. Judgment and thought content normal.    ED Course  Procedures (including critical care time) Reevaluation: 18:20: He was able to light with normal gait. Repeat vital signs are reassuring, normal.  Findings discussed with patient, and wife.    Date: 02/26/2012  Rate: 61  Rhythm: Atrial pacing  QRS Axis: left  PR and QT Intervals: Normal QT  ST/T Wave abnormalities: normal  PR and QRS Conduction Disutrbances:Normal QT, incomplete right bundle branch block, and left anterior fascicular block  Narrative Interpretation:   Old EKG Reviewed: unchanged   Labs Reviewed  COMPREHENSIVE METABOLIC PANEL - Abnormal; Notable for the following:    Glucose, Bld 116 (*)    GFR calc non Af Amer 57 (*)    GFR calc Af Amer 66 (*)    All other components within  normal limits  CBC WITH DIFFERENTIAL - Abnormal; Notable for the following:    RBC 3.81 (*)  Hemoglobin 12.2 (*)    HCT 35.6 (*)    RDW 17.1 (*)    Lymphocytes Relative 10 (*)    Monocytes Relative 13 (*)    All other components within normal limits  POCT I-STAT TROPONIN I   Dg Chest 2 View  07/22/2012  *RADIOLOGY REPORT*  Clinical Data: Loss of consciousness.  CHEST - 2 VIEW  Comparison: None.  Findings: There is cardiomegaly but no edema.  Lungs clear.  No pneumothorax or pleural effusion.  Pectus deformity noted.  Pacing device in place.  IMPRESSION: No acute disease.   Original Report Authenticated By: Orlean Patten, M.D.    Ct Head Wo Contrast  07/22/2012  *RADIOLOGY REPORT*  Clinical Data: The patient fell and has some head trauma.  Loss of consciousness.  CT HEAD WITHOUT CONTRAST  Technique:  Contiguous axial images were obtained from the base of the skull through the vertex without contrast.  Comparison: None.  Findings: There is no acute intracranial hemorrhage, infarction, or mass lesion.  The patient has extensive periventricular white matter disease consistent with small vessel ischemic disease.  Mild diffuse cerebral cortical atrophy with secondary ventricular dilatation.  No acute osseous abnormality.  Probable chronic mucosal thickening in the maxillary and ethmoid sinuses.  IMPRESSION: No acute intracranial abnormality.  Extensive chronic small vessel ischemic changes in the periventricular white matter.  Slight atrophy.   Original Report Authenticated By: Lorriane Shire, M.D.    Nursing Notes Reviewed/ Care Coordinated Applicable Imaging Reviewed Interpretation of Laboratory Data incorporated into ED treatment   1. Syncope       MDM  Syncope without clear cause. Patient's improved with treatment in the ED. Doubt metabolic instability, serious bacterial infection or impending vascular collapse; the patient is stable for discharge.    Plan: Home Medications- usual;  Home Treatments- rest, push fluids; Recommended follow up- PCP for check up in 1 week.    Richarda Blade, MD 07/22/12 [place], MD 07/22/12 [phone]

## 2012-07-22 NOTE — ED Notes (Signed)
Pt continues to deny pain, dizziness, sob, etc.

## 2012-07-22 NOTE — ED Notes (Signed)
Pt ao x 4.  Denies any pain or lethargy at this time.  Lung sounds clear.  Monitor showing paced rhythm.

## 2012-07-22 NOTE — ED Notes (Signed)
Pt was in [employer] and was pushing cart when he became very lethargic. His wife, walking in front of him, turned back to see him leaning against cart.  He then slid to the floor, hitting the R side of his R orbit, then he passed out for several seconds.  When EMS arrived, pt was diaphoretic, pale and lethargic. CBG of 114.  Presently pt ao x 4.  No diaphoresis or paleness noted.  Pt denies pain.  Pt is on xerelto, and has a pacemaker.  Per EMS pacemaker was firing on arrival, but was normal sinus when getting into truck.  Pt now in paced rhythm.

## 2012-08-02 ENCOUNTER — Encounter: Payer: Self-pay | Admitting: Cardiology

## 2012-08-02 ENCOUNTER — Ambulatory Visit (INDEPENDENT_AMBULATORY_CARE_PROVIDER_SITE_OTHER): Payer: Medicare Other | Admitting: Cardiology

## 2012-08-02 VITALS — BP 150/81 | HR 77 | Ht 73.0 in | Wt 167.0 lb

## 2012-08-02 DIAGNOSIS — I4891 Unspecified atrial fibrillation: Secondary | ICD-10-CM

## 2012-08-02 DIAGNOSIS — I495 Sick sinus syndrome: Secondary | ICD-10-CM

## 2012-08-02 DIAGNOSIS — Z950 Presence of cardiac pacemaker: Secondary | ICD-10-CM | POA: Diagnosis not present

## 2012-08-02 NOTE — Patient Instructions (Signed)
You have a scheduled appointment 11/01/2012 @ 11:30am with Dr. [name].

## 2012-08-10 DIAGNOSIS — R5381 Other malaise: Secondary | ICD-10-CM | POA: Diagnosis not present

## 2012-08-10 DIAGNOSIS — R5383 Other fatigue: Secondary | ICD-10-CM | POA: Diagnosis not present

## 2012-08-10 DIAGNOSIS — D649 Anemia, unspecified: Secondary | ICD-10-CM | POA: Diagnosis not present

## 2012-08-10 DIAGNOSIS — E559 Vitamin D deficiency, unspecified: Secondary | ICD-10-CM | POA: Diagnosis not present

## 2012-08-10 DIAGNOSIS — E538 Deficiency of other specified B group vitamins: Secondary | ICD-10-CM | POA: Diagnosis not present

## 2012-08-10 DIAGNOSIS — R55 Syncope and collapse: Secondary | ICD-10-CM | POA: Diagnosis not present

## 2012-08-10 NOTE — Progress Notes (Signed)
ELECTROPHYSIOLOGY OFFICE NOTE  Patient ID: ZEBEDEE SEGUNDO MRN: [id number], DOB/AGE: Aug 02, 1934   Date of Visit: 08/10/2012  Primary Physician: Gar Ponto, MD Primary EP: Cristopher Peru, MD Reason for Visit: EP/device follow-up  History of Present Illness  [name] is a 78 year old man with coronary artery disease, symptomatic tachy-brady syndrome, s/p permanent pacemaker insertion, paroxysmal atrial fibrillation and hypertension. He was initially placed on Multaq but reinterrogation of his pacemaker when he last saw Dr. [name] (Dec 2013) demonstrated that his atrial fibrillation burden had not improved. After conferring with his pulmonologist in [place], [place], [place] was discontinued in favor of amiodarone. He presents today for follow-up. He reports he is feeling well. He denies CP or SOB. He denies palpitations or symptoms of AF. He denies dizziness, near syncope or syncope. He denies LE swelling, orthopnea, PND or recent weight gain. [name] reports he is compliant and tolerating medications without difficulty.  Past Medical History Past Medical History  Diagnosis Date  . Hypertension   . Dyslipidemia   . Atrial fibrillation   . CAD (coronary artery disease)     a.  cath 5/12: mLAD 50% then tandem 50%, dLAD 30%; D1 30%; OM1 occluded with collateral flow; pRCA 25%; EF 55% with inf HK;  occluded OM likely cause of perfusion defect on Myoview  . Tachy-brady syndrome     s/p pacer  . Ulcerative colitis   . Dizziness   . Nonspecific abnormal unspecified cardiovascular function study   . GERD (gastroesophageal reflux disease)     Past Surgical History Past Surgical History  Procedure Laterality Date  . Implantation of a medtronic dual chamber pacemaker  02/01/06    [name] [name] MD  . Left knee arthroscopy with debridement  04/23/2006    [name] M.D  . Left knee total knee  replacement  05/13/07    [name] M.D.  . Knee surgery    . Back surgery    .  Lumbar spine surgery    . Insert / replace / remove pacemaker      MEDTRONIC....PPM Serial Number:  [id number].....Marland KitchenPPM Model Number:  [id number]....Marland KitchenPPM DOI:  02/01/2006    . Cardiac catheterization  09/20/10    EF 55%.....Dr. Percival Spanish     Allergies/Intolerances Allergies  Allergen Reactions  . Nsaids     colitis    Current Home Medications Current Outpatient Prescriptions  Medication Sig Dispense Refill  . amiodarone (PACERONE) 200 MG tablet Take 1 tablet (200 mg total) by mouth daily.  90 tablet  3  . atenolol (TENORMIN) 100 MG tablet Take 50 mg by mouth 2 (two) times daily.       . folic acid (FOLVITE) 1 MG tablet Take 1 mg by mouth daily.      . isosorbide mononitrate (IMDUR) 30 MG 24 hr tablet Take 1 tablet (30 mg total) by mouth daily.  90 tablet  3  . ketoconazole (NIZORAL) 2 % shampoo Apply 1 application topically as needed for itching.       . methotrexate (RHEUMATREX) 2.5 MG tablet Take 15 mg by mouth once a week. On Tuesday      . montelukast (SINGULAIR) 10 MG tablet Take 10 mg by mouth at bedtime.      . Multiple Vitamin (MULTIVITAMIN WITH MINERALS) TABS Take 1 tablet by mouth daily. Centrum Silver      . Rivaroxaban (XARELTO) 20 MG TABS Take 20 mg by mouth daily.  90 tablet  2  .  vitamin C (ASCORBIC ACID) 500 MG tablet Take 500 mg by mouth daily.       No current facility-administered medications for this visit.    Social History Social History  . Marital Status: Married   Occupational History  . retired    Social History Main Topics  . Smoking status: Former Smoker -- 1.00 packs/day for 15 years    Types: Cigarettes    Quit date: 05/11/1978  . Smokeless tobacco: Never Used  . Alcohol Use: No  . Drug Use: No   Review of Systems General: No chills, fever, night sweats or weight changes Cardiovascular: No chest pain, dyspnea on exertion, edema, orthopnea, palpitations, paroxysmal nocturnal dyspnea Dermatological: No rash, lesions or masses Respiratory: No  cough, dyspnea Urologic: No hematuria, dysuria Abdominal: No nausea, vomiting, diarrhea, bright red blood per rectum, melena, or hematemesis Neurologic: No visual changes, weakness, changes in mental status All other systems reviewed and are otherwise negative except as noted above.  Physical Exam Blood pressure 150/81, pulse 77, height 6' 1"  (1.854 m), weight 167 lb (75.751 kg).  General: Well developed, well appearing 78 year old male in no acute distress. HEENT: Normocephalic, atraumatic. EOMs intact. Sclera nonicteric. Oropharynx clear.  Neck: Supple. No JVD. Lungs: Respirations regular and unlabored, CTA bilaterally. No wheezes, rales or rhonchi. Heart: RRR. S1, S2 present. No murmurs, rub, S3 or S4. Abdomen: Soft, non-distended.  Extremities: No clubbing, cyanosis or edema. DP/PT/Radials 2+ and equal bilaterally. Psych: Normal affect. Neuro: Alert and oriented X 3. Moves all extremities spontaneously.   Diagnostics Device interrogation today - Normal device function. Thresholds, sensing, impedances consistent with previous measurements. Device programmed to maximize longevity. 533 mode switches, 33.7% of the time, longest 0 hours, 45 minutes. No high ventricular rates noted. Device programmed at appropriate safety margins. Histogram distribution appropriate for patient activity level. Device programmed to optimize intrinsic conduction. Estimated longevity 3 years.   Assessment and Plan 1. Atrial fibrillation [name] reports improved symptoms since starting amiodarone Continue amiodarone and rate control Continue Xarelto for embolic prophylaxis 2. Tachy-brady syndrome s/p PPM implant Normal PPM function No programming changes made Continue routine remote device follow-up every 3 months Return to clinic for follow-up with Dr. [name] in 3 months  This plan of care was formulated with Dr. [name] who was in to see the patient with me. Signed, Ileene Hutchinson, PA-C 08/10/2012,  2:54 PM

## 2012-08-11 LAB — PACEMAKER DEVICE OBSERVATION
AL AMPLITUDE: 5.6 mv
AL THRESHOLD: 0.5 V
ATRIAL PACING PM: 88.5
BATTERY VOLTAGE: 2.75 V
RV LEAD IMPEDENCE PM: 427 Ohm
RV LEAD THRESHOLD: 0.75 V
VENTRICULAR PACING PM: 2.5

## 2012-09-07 ENCOUNTER — Encounter: Payer: Self-pay | Admitting: Internal Medicine

## 2012-09-26 DIAGNOSIS — H40059 Ocular hypertension, unspecified eye: Secondary | ICD-10-CM | POA: Diagnosis not present

## 2012-09-27 DIAGNOSIS — Z79899 Other long term (current) drug therapy: Secondary | ICD-10-CM | POA: Diagnosis not present

## 2012-09-27 DIAGNOSIS — I10 Essential (primary) hypertension: Secondary | ICD-10-CM | POA: Diagnosis not present

## 2012-09-27 DIAGNOSIS — L299 Pruritus, unspecified: Secondary | ICD-10-CM | POA: Diagnosis not present

## 2012-09-27 DIAGNOSIS — L570 Actinic keratosis: Secondary | ICD-10-CM | POA: Diagnosis not present

## 2012-09-27 DIAGNOSIS — D649 Anemia, unspecified: Secondary | ICD-10-CM | POA: Diagnosis not present

## 2012-09-27 DIAGNOSIS — L259 Unspecified contact dermatitis, unspecified cause: Secondary | ICD-10-CM | POA: Diagnosis not present

## 2012-09-27 DIAGNOSIS — K5100 Ulcerative (chronic) pancolitis without complications: Secondary | ICD-10-CM | POA: Diagnosis not present

## 2012-09-27 DIAGNOSIS — Z85828 Personal history of other malignant neoplasm of skin: Secondary | ICD-10-CM | POA: Diagnosis not present

## 2012-10-04 DIAGNOSIS — E782 Mixed hyperlipidemia: Secondary | ICD-10-CM | POA: Diagnosis not present

## 2012-10-04 DIAGNOSIS — M1990 Unspecified osteoarthritis, unspecified site: Secondary | ICD-10-CM | POA: Diagnosis not present

## 2012-10-04 DIAGNOSIS — R5381 Other malaise: Secondary | ICD-10-CM | POA: Diagnosis not present

## 2012-10-04 DIAGNOSIS — I4891 Unspecified atrial fibrillation: Secondary | ICD-10-CM | POA: Diagnosis not present

## 2012-10-04 DIAGNOSIS — I10 Essential (primary) hypertension: Secondary | ICD-10-CM | POA: Diagnosis not present

## 2012-10-04 DIAGNOSIS — R5383 Other fatigue: Secondary | ICD-10-CM | POA: Diagnosis not present

## 2012-10-04 DIAGNOSIS — F0280 Dementia in other diseases classified elsewhere without behavioral disturbance: Secondary | ICD-10-CM | POA: Diagnosis not present

## 2012-10-04 DIAGNOSIS — J309 Allergic rhinitis, unspecified: Secondary | ICD-10-CM | POA: Diagnosis not present

## 2012-10-04 DIAGNOSIS — D649 Anemia, unspecified: Secondary | ICD-10-CM | POA: Diagnosis not present

## 2012-10-29 DIAGNOSIS — Z79899 Other long term (current) drug therapy: Secondary | ICD-10-CM | POA: Diagnosis not present

## 2012-10-29 DIAGNOSIS — S32509A Unspecified fracture of unspecified pubis, initial encounter for closed fracture: Secondary | ICD-10-CM | POA: Diagnosis not present

## 2012-10-29 DIAGNOSIS — Z7901 Long term (current) use of anticoagulants: Secondary | ICD-10-CM | POA: Diagnosis not present

## 2012-10-29 DIAGNOSIS — M533 Sacrococcygeal disorders, not elsewhere classified: Secondary | ICD-10-CM | POA: Diagnosis not present

## 2012-10-29 DIAGNOSIS — R52 Pain, unspecified: Secondary | ICD-10-CM | POA: Diagnosis not present

## 2012-10-29 DIAGNOSIS — Z950 Presence of cardiac pacemaker: Secondary | ICD-10-CM | POA: Diagnosis not present

## 2012-11-01 ENCOUNTER — Encounter: Payer: Medicare Other | Admitting: Internal Medicine

## 2012-11-02 DIAGNOSIS — S32509A Unspecified fracture of unspecified pubis, initial encounter for closed fracture: Secondary | ICD-10-CM | POA: Diagnosis not present

## 2012-12-06 DIAGNOSIS — L259 Unspecified contact dermatitis, unspecified cause: Secondary | ICD-10-CM | POA: Diagnosis not present

## 2012-12-06 DIAGNOSIS — D485 Neoplasm of uncertain behavior of skin: Secondary | ICD-10-CM | POA: Diagnosis not present

## 2012-12-06 DIAGNOSIS — L570 Actinic keratosis: Secondary | ICD-10-CM | POA: Diagnosis not present

## 2012-12-06 DIAGNOSIS — Z85828 Personal history of other malignant neoplasm of skin: Secondary | ICD-10-CM | POA: Diagnosis not present

## 2012-12-14 ENCOUNTER — Other Ambulatory Visit: Payer: Self-pay

## 2012-12-22 ENCOUNTER — Ambulatory Visit (INDEPENDENT_AMBULATORY_CARE_PROVIDER_SITE_OTHER): Payer: Medicare Other | Admitting: Internal Medicine

## 2012-12-22 ENCOUNTER — Encounter: Payer: Self-pay | Admitting: Internal Medicine

## 2012-12-22 VITALS — BP 123/78 | HR 67 | Ht 73.5 in | Wt 159.0 lb

## 2012-12-22 DIAGNOSIS — I4891 Unspecified atrial fibrillation: Secondary | ICD-10-CM | POA: Diagnosis not present

## 2012-12-22 DIAGNOSIS — I495 Sick sinus syndrome: Secondary | ICD-10-CM | POA: Diagnosis not present

## 2012-12-22 LAB — PACEMAKER DEVICE OBSERVATION
AL IMPEDENCE PM: 563 Ohm
AL THRESHOLD: 0.5 V
RV LEAD AMPLITUDE: 8 mv
RV LEAD IMPEDENCE PM: 465 Ohm
RV LEAD THRESHOLD: 0.75 V

## 2012-12-22 NOTE — Progress Notes (Signed)
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  Since last being seen in our clinic, the patient reports doing very well.  He has been placed on amiodarone by Dr [name] with good control of his afib. (Afib burden today by PPM check is 1.5%).  He is tolerating xarelto without bleeding.  Today, he denies symptoms of palpitations, chest pain, shortness of breath,  lower extremity edema, dizziness, presyncope, or syncope.  The patient is otherwise without complaint today.   Past Medical History  Diagnosis Date  . Hypertension   . Dyslipidemia   . Atrial fibrillation   . CAD (coronary artery disease)     a.  cath 5/12: mLAD 50% then tandem 50%, dLAD 30%; D1 30%; OM1 occluded with collateral flow; pRCA 25%; EF 55% with inf HK;  occluded OM likely cause of perfusion defect on Myoview  . Tachy-brady syndrome     s/p pacer  . Ulcerative colitis   . Dizziness   . Nonspecific abnormal unspecified cardiovascular function study   . GERD (gastroesophageal reflux disease)    Past Surgical History  Procedure Laterality Date  . Implantation of a medtronic dual chamber pacemaker  02/01/06    [name] [name] MD  . Left knee arthroscopy with debridement  04/23/2006    [name] M.D  . Left knee total knee  replacement  05/13/07    [name] M.D.  . Knee surgery    . Back surgery    . Lumbar spine surgery    . Insert / replace / remove pacemaker      MEDTRONIC....PPM Serial Number:  [id number].....Marland KitchenPPM Model Number:  [id number]....Marland KitchenPPM DOI:  02/01/2006    . Cardiac catheterization  09/20/10    EF 55%.....Dr. Percival Spanish    Current Outpatient Prescriptions  Medication Sig Dispense Refill  . amiodarone (PACERONE) 200 MG tablet Take 1 tablet (200 mg total) by mouth daily.  90 tablet  3  . atenolol (TENORMIN) 100 MG tablet Take 50 mg by mouth 2 (two) times daily.       . cetirizine (ZYRTEC) 10 MG tablet  Take 10 mg by mouth as directed. *IN [place] ONLY*      . folic acid (FOLVITE) 1 MG tablet Take 1 mg by mouth daily.      . isosorbide mononitrate (IMDUR) 30 MG 24 hr tablet Take 1 tablet (30 mg total) by mouth daily.  90 tablet  3  . loratadine (CLARITIN) 10 MG tablet Take 10 mg by mouth as directed. * IN [place] ONLY *      . methotrexate (RHEUMATREX) 2.5 MG tablet Take 15 mg by mouth once a week. On Tuesday      . montelukast (SINGULAIR) 10 MG tablet Take 10 mg by mouth at bedtime.      . Rivaroxaban (XARELTO) 20 MG TABS Take 20 mg by mouth daily.  90 tablet  2   No current facility-administered medications for this visit.    Physical Exam: Filed Vitals:   12/22/12 1411  BP: 123/78  Pulse: 67  Height: 6' 1.5" (1.867 m)  Weight: 159 lb (72.122 kg)    GEN- The patient is well appearing, alert and oriented x 3 today.   Head- normocephalic, atraumatic Eyes-  Sclera clear, conjunctiva pink Ears- hearing intact Oropharynx- clear Lungs- Clear to ausculation bilaterally, normal work of breathing Chest- pacemaker pocket is well healed  Heart- Regular rate and rhythm, no murmurs, rubs or gallops, PMI not laterally displaced GI- soft, NT, ND, + BS Extremities- no clubbing, cyanosis, or edema  Pacemaker interrogation- reviewed in detail today,  See PACEART report  Assessment and Plan:  1. Tachycardia/ Bradycardia Normal pacemaker function See Claudia Desanctis Art report No changes today  2. afib Well controlled with amiodarone I have informed of the importance of following LFTs/ TFTs They prefer to have Dr Quillian Quince do this when they see him in September Return in 6 months I may decrease amiodarone to 176m if afib is still controlled at that time

## 2012-12-22 NOTE — Patient Instructions (Addendum)
Continue all current medications. 6 months - [place]

## 2013-01-10 DIAGNOSIS — E782 Mixed hyperlipidemia: Secondary | ICD-10-CM | POA: Diagnosis not present

## 2013-01-10 DIAGNOSIS — F0280 Dementia in other diseases classified elsewhere without behavioral disturbance: Secondary | ICD-10-CM | POA: Diagnosis not present

## 2013-01-10 DIAGNOSIS — F411 Generalized anxiety disorder: Secondary | ICD-10-CM | POA: Diagnosis not present

## 2013-01-10 DIAGNOSIS — D649 Anemia, unspecified: Secondary | ICD-10-CM | POA: Diagnosis not present

## 2013-01-10 DIAGNOSIS — I10 Essential (primary) hypertension: Secondary | ICD-10-CM | POA: Diagnosis not present

## 2013-01-10 DIAGNOSIS — I4891 Unspecified atrial fibrillation: Secondary | ICD-10-CM | POA: Diagnosis not present

## 2013-01-17 DIAGNOSIS — F0280 Dementia in other diseases classified elsewhere without behavioral disturbance: Secondary | ICD-10-CM | POA: Diagnosis not present

## 2013-01-17 DIAGNOSIS — D649 Anemia, unspecified: Secondary | ICD-10-CM | POA: Diagnosis not present

## 2013-01-17 DIAGNOSIS — E782 Mixed hyperlipidemia: Secondary | ICD-10-CM | POA: Diagnosis not present

## 2013-01-17 DIAGNOSIS — R5381 Other malaise: Secondary | ICD-10-CM | POA: Diagnosis not present

## 2013-01-17 DIAGNOSIS — I10 Essential (primary) hypertension: Secondary | ICD-10-CM | POA: Diagnosis not present

## 2013-01-17 DIAGNOSIS — J309 Allergic rhinitis, unspecified: Secondary | ICD-10-CM | POA: Diagnosis not present

## 2013-01-17 DIAGNOSIS — I4891 Unspecified atrial fibrillation: Secondary | ICD-10-CM | POA: Diagnosis not present

## 2013-01-17 DIAGNOSIS — M1990 Unspecified osteoarthritis, unspecified site: Secondary | ICD-10-CM | POA: Diagnosis not present

## 2013-02-07 DIAGNOSIS — Z23 Encounter for immunization: Secondary | ICD-10-CM | POA: Diagnosis not present

## 2013-03-09 ENCOUNTER — Telehealth: Payer: Self-pay | Admitting: Internal Medicine

## 2013-03-09 NOTE — Telephone Encounter (Signed)
New message    Can pt have his device checked in [place] on 11-17 instead of having a remote check at home.  Wife says he has dementia and she does not know how to do it.  Pls call her back at your convenience

## 2013-03-13 NOTE — Telephone Encounter (Signed)
Wife not available at this time. Wife to return call/kwm

## 2013-03-14 DIAGNOSIS — L259 Unspecified contact dermatitis, unspecified cause: Secondary | ICD-10-CM | POA: Diagnosis not present

## 2013-03-14 DIAGNOSIS — L570 Actinic keratosis: Secondary | ICD-10-CM | POA: Diagnosis not present

## 2013-03-14 DIAGNOSIS — Z85828 Personal history of other malignant neoplasm of skin: Secondary | ICD-10-CM | POA: Diagnosis not present

## 2013-03-14 DIAGNOSIS — R5382 Chronic fatigue, unspecified: Secondary | ICD-10-CM | POA: Diagnosis not present

## 2013-03-14 DIAGNOSIS — Z79899 Other long term (current) drug therapy: Secondary | ICD-10-CM | POA: Diagnosis not present

## 2013-03-14 NOTE — Telephone Encounter (Signed)
Spoke w/wife in regards to sending transmission. Wife to send transmission in on 03-27-13.

## 2013-03-14 NOTE — Telephone Encounter (Signed)
Busy signal at this time/kwm

## 2013-03-16 ENCOUNTER — Other Ambulatory Visit: Payer: Self-pay

## 2013-03-23 ENCOUNTER — Telehealth (INDEPENDENT_AMBULATORY_CARE_PROVIDER_SITE_OTHER): Payer: Self-pay | Admitting: *Deleted

## 2013-03-23 NOTE — Telephone Encounter (Signed)
Ann said Dr. Tarri Glenn has taken [name] off his medicine that helps with Colitis and now he has diarrhea. She would like to know what to do. The return phone number is [phone].

## 2013-03-23 NOTE — Telephone Encounter (Signed)
I have spoke with the patient's wife. Dr.Beavers is decreasing the Methotrexate. It started on 03/14/13. Patient is taking 2 by mouth every Tuesday. The diarrhea , horrible per [name] , started last night. She gave him some Kaopectate this morning and it has helped for now, she would like to know Dr. Olevia Perches recommendation?

## 2013-03-23 NOTE — Telephone Encounter (Signed)
Dr.[name] paged , forwarded message  to him also.

## 2013-03-24 ENCOUNTER — Other Ambulatory Visit (INDEPENDENT_AMBULATORY_CARE_PROVIDER_SITE_OTHER): Payer: Self-pay | Admitting: Internal Medicine

## 2013-03-24 MED ORDER — MESALAMINE 400 MG PO CPDR: 1600.0000 mg | DELAYED_RELEASE_CAPSULE | Freq: Two times a day (BID) | ORAL | Status: DC

## 2013-03-24 NOTE — Telephone Encounter (Signed)
Patient's call returned. Talk with Mrs. [name]. Patient will start Asacol 1600 mg by mouth twice a day Office visit in 2 months

## 2013-03-27 ENCOUNTER — Telehealth (INDEPENDENT_AMBULATORY_CARE_PROVIDER_SITE_OTHER): Payer: Self-pay | Admitting: *Deleted

## 2013-03-27 ENCOUNTER — Ambulatory Visit (INDEPENDENT_AMBULATORY_CARE_PROVIDER_SITE_OTHER): Payer: Medicare Other | Admitting: *Deleted

## 2013-03-27 DIAGNOSIS — Z950 Presence of cardiac pacemaker: Secondary | ICD-10-CM

## 2013-03-27 DIAGNOSIS — I495 Sick sinus syndrome: Secondary | ICD-10-CM | POA: Diagnosis not present

## 2013-03-27 LAB — MDC_IDC_ENUM_SESS_TYPE_REMOTE
Battery Remaining Longevity: 28 mo
Battery Voltage: 2.72 V
Brady Statistic AP VS Percent: 95 %
Lead Channel Pacing Threshold Amplitude: 0.75 V
Lead Channel Pacing Threshold Pulse Width: 0.4 ms
Lead Channel Setting Pacing Amplitude: 2 V
Lead Channel Setting Pacing Amplitude: 2.5 V
Lead Channel Setting Pacing Pulse Width: 0.4 ms

## 2013-03-27 NOTE — Telephone Encounter (Signed)
[name] from [facility] in [place] would like to know if our office would approve to change his Asacol 400 mg to Delzicor 400 mg. The Asacol is no longer available. [facility] return phone number is [phone].

## 2013-03-27 NOTE — Telephone Encounter (Signed)
I have called the Delizcol 400 mg into [facility] . Instructions are the same as Asacol.

## 2013-03-29 NOTE — Telephone Encounter (Signed)
No answer or message machine.

## 2013-03-29 NOTE — Telephone Encounter (Signed)
Apt has been scheduled for 06/05/12 with Dr. Laural Golden.

## 2013-04-03 ENCOUNTER — Encounter: Payer: Self-pay | Admitting: *Deleted

## 2013-04-05 ENCOUNTER — Encounter: Payer: Self-pay | Admitting: Internal Medicine

## 2013-04-05 ENCOUNTER — Telehealth (INDEPENDENT_AMBULATORY_CARE_PROVIDER_SITE_OTHER): Payer: Self-pay | Admitting: *Deleted

## 2013-04-05 NOTE — Telephone Encounter (Signed)
Patient's insurance would not cover the Asacol nor Delzicol.  Per the [employer] they will cover the following, Apriso,Lialda, Pentasa. Per Dr.[name] call in Apriso 1.5 grams Patient is to take 4 by mouth daily-1 month- 5 refills This was called to [facility]. Patient was called and made aware.

## 2013-04-17 DIAGNOSIS — G619 Inflammatory polyneuropathy, unspecified: Secondary | ICD-10-CM | POA: Diagnosis not present

## 2013-04-17 DIAGNOSIS — G5620 Lesion of ulnar nerve, unspecified upper limb: Secondary | ICD-10-CM | POA: Diagnosis not present

## 2013-04-17 DIAGNOSIS — L299 Pruritus, unspecified: Secondary | ICD-10-CM | POA: Diagnosis not present

## 2013-05-03 ENCOUNTER — Other Ambulatory Visit: Payer: Self-pay | Admitting: Internal Medicine

## 2013-05-08 ENCOUNTER — Other Ambulatory Visit: Payer: Self-pay | Admitting: Internal Medicine

## 2013-05-16 DIAGNOSIS — D485 Neoplasm of uncertain behavior of skin: Secondary | ICD-10-CM | POA: Diagnosis not present

## 2013-05-16 DIAGNOSIS — L820 Inflamed seborrheic keratosis: Secondary | ICD-10-CM | POA: Diagnosis not present

## 2013-05-16 DIAGNOSIS — L570 Actinic keratosis: Secondary | ICD-10-CM | POA: Diagnosis not present

## 2013-05-16 DIAGNOSIS — L259 Unspecified contact dermatitis, unspecified cause: Secondary | ICD-10-CM | POA: Diagnosis not present

## 2013-05-16 DIAGNOSIS — Z79899 Other long term (current) drug therapy: Secondary | ICD-10-CM | POA: Diagnosis not present

## 2013-05-23 DIAGNOSIS — G309 Alzheimer's disease, unspecified: Secondary | ICD-10-CM | POA: Diagnosis not present

## 2013-05-23 DIAGNOSIS — M1990 Unspecified osteoarthritis, unspecified site: Secondary | ICD-10-CM | POA: Diagnosis not present

## 2013-05-23 DIAGNOSIS — R5383 Other fatigue: Secondary | ICD-10-CM | POA: Diagnosis not present

## 2013-05-23 DIAGNOSIS — R5381 Other malaise: Secondary | ICD-10-CM | POA: Diagnosis not present

## 2013-05-23 DIAGNOSIS — E782 Mixed hyperlipidemia: Secondary | ICD-10-CM | POA: Diagnosis not present

## 2013-05-23 DIAGNOSIS — D649 Anemia, unspecified: Secondary | ICD-10-CM | POA: Diagnosis not present

## 2013-05-23 DIAGNOSIS — I10 Essential (primary) hypertension: Secondary | ICD-10-CM | POA: Diagnosis not present

## 2013-05-23 DIAGNOSIS — J309 Allergic rhinitis, unspecified: Secondary | ICD-10-CM | POA: Diagnosis not present

## 2013-05-23 DIAGNOSIS — F0280 Dementia in other diseases classified elsewhere without behavioral disturbance: Secondary | ICD-10-CM | POA: Diagnosis not present

## 2013-05-23 DIAGNOSIS — I4891 Unspecified atrial fibrillation: Secondary | ICD-10-CM | POA: Diagnosis not present

## 2013-06-05 ENCOUNTER — Encounter (INDEPENDENT_AMBULATORY_CARE_PROVIDER_SITE_OTHER): Payer: Self-pay | Admitting: Internal Medicine

## 2013-06-05 ENCOUNTER — Ambulatory Visit (INDEPENDENT_AMBULATORY_CARE_PROVIDER_SITE_OTHER): Payer: Medicare Other | Admitting: Internal Medicine

## 2013-06-05 VITALS — BP 120/78 | HR 76 | Temp 97.2°F | Resp 18 | Ht 73.0 in | Wt 169.5 lb

## 2013-06-05 DIAGNOSIS — K5190 Ulcerative colitis, unspecified, without complications: Secondary | ICD-10-CM | POA: Diagnosis not present

## 2013-06-05 DIAGNOSIS — L299 Pruritus, unspecified: Secondary | ICD-10-CM | POA: Diagnosis not present

## 2013-06-05 MED ORDER — MESALAMINE ER 0.375 G PO CP24
1500.0000 mg | ORAL_CAPSULE | Freq: Every day | ORAL | Status: DC
Start: 2013-06-05 — End: 2013-10-06

## 2013-06-05 NOTE — Progress Notes (Signed)
Presenting complaint;   Followup for ulcerative colitis.  Database;  Patient is 79 year old Caucasian male who has over 30 year history of distal ulcerative colitis who has been on mesalamine for years. His last colonoscopy was in July 2011 revealing disease to distal sigmoid colon and rectum. 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 Sandford Craze. He mesalamine was therefore discontinued. Patient called [place] over 2 months ago that he was going to come off methotrexate and he was begun on Apriso on 03/24/2013. Methotrexate was discontinued 3 weeks ago.  Subjective;  Patient is accompanied by his wife. He has no complaints. He generally has 1 stool daily he is either formed to hard. He therefore decided to drop Apriso dose to 1 capsule a day. His appetite has improved but is still not normal. He has gained 7 pounds since his last visit of March 2014. He denies abdominal pain melena or rectal bleeding. He complains of pruritus which started 2 or 3 days ago. Patient's wife states that 2 of their daughters have gluten intolerance Inderal bloodied if their dad has celiac disease with development of pruritus and diagnosis of vasculitis. He is not having any side effects with Apriso.  Current Medications: Current Outpatient Prescriptions  Medication Sig Dispense Refill  . amiodarone (PACERONE) 200 MG tablet TAKE 1 TABLET BY MOUTH EVERY DAY  90 tablet  0  . atenolol (TENORMIN) 100 MG tablet Take 50 mg by mouth 2 (two) times daily.       . Cholecalciferol (VITAMIN D3) 2000 UNITS TABS Take by mouth daily.      . isosorbide mononitrate (IMDUR) 30 MG 24 hr tablet Take 1 tablet (30 mg total) by mouth daily.  90 tablet  3  . loratadine (CLARITIN) 10 MG tablet Take 10 mg by mouth as directed. * IN SUMMER SEASONS ONLY *      . Mesalamine (ASACOL) 400 MG CPDR DR capsule Take 4 capsules (1,600 mg total) by mouth 2 (two) times daily.   240 capsule  5  . Multiple Vitamins-Minerals (CENTRUM PO) Take by mouth daily.      . Rivaroxaban (XARELTO) 20 MG TABS Take 20 mg by mouth daily.  90 tablet  2   No current facility-administered medications for this visit.    Objective: Blood pressure 120/78, pulse 76, temperature 97.2 F (36.2 C), temperature source Oral, resp. rate 18, height 6' 1"  (1.854 m), weight 169 lb 8 oz (76.885 kg). Patient is alert and in no acute distress. Conjunctiva is pink. Sclera is nonicteric Oropharyngeal mucosa is normal. No neck masses or thyromegaly noted. Cardiac exam with irregular rhythm normal S1 and S2. No murmur or gallop noted. Lungs are clear to auscultation. Abdomen symmetrical. He has a few scratch marks at right side of his abdomen. It is soft and nontender without organomegaly or masses.  No LE edema or clubbing noted.    Assessment:  #1. Distal ulcerative colitis. He remains in remission but he must take full dose of oral mesalamine in order to prevent relapse. Methotrexate was stopped 3 weeks ago and it may be too early to know whether or not oral mesalamine but controlled colonic disease. #2.Pruritus without skin rash. He does not have any other symptoms to suggest celiac disease but will check serology   Plan:  Patient advised to take Apriso at full dose which is 1.5 g daily. Can take Colace 200 mg by mouth daily for  constipation prn. Celiac antibody panel. Office visit in 6 months.

## 2013-06-05 NOTE — Patient Instructions (Signed)
Physician will contact you with results of blood work. Can stool softener(Colace 200 mg) by mouth daily at bedtime as needed for constipation.

## 2013-06-06 LAB — GLIADIN ANTIBODIES, SERUM
Gliadin IgA: 1.9 U/mL (ref <20)
Gliadin IgG: 2.8 U/mL (ref <20)

## 2013-06-06 LAB — RETICULIN ANTIBODIES, IGA W TITER: Reticulin Ab, IgA: NEGATIVE

## 2013-06-06 LAB — TISSUE TRANSGLUTAMINASE, IGA: Tissue Transglutaminase Ab, IgA: 2.6 U/mL (ref <20)

## 2013-06-21 DIAGNOSIS — L98499 Non-pressure chronic ulcer of skin of other sites with unspecified severity: Secondary | ICD-10-CM | POA: Diagnosis not present

## 2013-06-21 DIAGNOSIS — D485 Neoplasm of uncertain behavior of skin: Secondary | ICD-10-CM | POA: Diagnosis not present

## 2013-06-21 DIAGNOSIS — L259 Unspecified contact dermatitis, unspecified cause: Secondary | ICD-10-CM | POA: Diagnosis not present

## 2013-06-30 ENCOUNTER — Encounter: Payer: Medicare Other | Admitting: *Deleted

## 2013-07-03 ENCOUNTER — Encounter: Payer: Self-pay | Admitting: *Deleted

## 2013-07-10 ENCOUNTER — Ambulatory Visit (INDEPENDENT_AMBULATORY_CARE_PROVIDER_SITE_OTHER): Payer: Medicare Other | Admitting: *Deleted

## 2013-07-10 DIAGNOSIS — I4891 Unspecified atrial fibrillation: Secondary | ICD-10-CM

## 2013-07-10 DIAGNOSIS — I495 Sick sinus syndrome: Secondary | ICD-10-CM

## 2013-07-11 ENCOUNTER — Telehealth (INDEPENDENT_AMBULATORY_CARE_PROVIDER_SITE_OTHER): Payer: Self-pay | Admitting: *Deleted

## 2013-07-11 NOTE — Telephone Encounter (Signed)
Itching has worsened in the last 3-4 weeks. On patient's side,belly,under arms, lower back. Dr.[name] made aware. Patient may go back on Methotrexate 10 mg - take 1 by mouth 1 time per week. He will need to discontinue the Apriso. Patient called and states his wife isn't home and he will have her to call office.

## 2013-07-11 NOTE — Telephone Encounter (Signed)
[name] is having a lot of itching again. Would like to know if he should continuing taking his Apriso? [name] and Tkai's return phone number is [phone].

## 2013-07-12 ENCOUNTER — Telehealth (INDEPENDENT_AMBULATORY_CARE_PROVIDER_SITE_OTHER): Payer: Self-pay | Admitting: *Deleted

## 2013-07-12 DIAGNOSIS — K5190 Ulcerative colitis, unspecified, without complications: Secondary | ICD-10-CM

## 2013-07-12 NOTE — Telephone Encounter (Signed)
Patient can stop apriso Begin methotrexate 10 mg by mouth once a week. Can call three-month supply with one refill Patient will need CBC and LFTs in 4 weeks.

## 2013-07-12 NOTE — Telephone Encounter (Signed)
[name] ask that a prescription be sent in for the Methotrexate 10 mg to [facility] in [place]. A refill request has been sent to [place].

## 2013-07-12 NOTE — Telephone Encounter (Signed)
Prescription was called to [place]. Labs have been ordered Patient wife aware.

## 2013-07-12 NOTE — Telephone Encounter (Signed)
Wife ask that we send a request for the Methotrexate to [facility] in [place] ,[place] She ask for specific directions.

## 2013-07-15 LAB — MDC_IDC_ENUM_SESS_TYPE_REMOTE
Brady Statistic AP VP Percent: 4 %
Brady Statistic AP VS Percent: 96 %
Brady Statistic AS VS Percent: 0 %
Lead Channel Impedance Value: 484 Ohm
Lead Channel Pacing Threshold Amplitude: 0.625 V
Lead Channel Pacing Threshold Pulse Width: 0.4 ms
Lead Channel Pacing Threshold Pulse Width: 0.4 ms
Lead Channel Sensing Intrinsic Amplitude: 16 mV
Lead Channel Setting Pacing Amplitude: 2 V
Lead Channel Setting Pacing Amplitude: 2.5 V
Lead Channel Setting Pacing Pulse Width: 0.4 ms
MDC IDC MSMT BATTERY IMPEDANCE: 1889 Ohm
MDC IDC MSMT BATTERY REMAINING LONGEVITY: 27 mo
MDC IDC MSMT BATTERY VOLTAGE: 2.73 V
MDC IDC MSMT LEADCHNL RA IMPEDANCE VALUE: 554 Ohm
MDC IDC MSMT LEADCHNL RV PACING THRESHOLD AMPLITUDE: 0.875 V
MDC IDC SESS DTM: 20150302162050
MDC IDC SET LEADCHNL RV SENSING SENSITIVITY: 2 mV
MDC IDC STAT BRADY AS VP PERCENT: 0 %

## 2013-07-25 ENCOUNTER — Telehealth (INDEPENDENT_AMBULATORY_CARE_PROVIDER_SITE_OTHER): Payer: Self-pay | Admitting: *Deleted

## 2013-07-25 NOTE — Telephone Encounter (Signed)
Patient's wife was called and ask that she add the Folic Acid 1 mg to the patient 's medication since he is taking the Methotrexate. She states that she has a prescription of this and had already started giving this to him. She was advised that whena refill was needed , to have the pharmacy send [place] that request.

## 2013-08-02 ENCOUNTER — Other Ambulatory Visit (INDEPENDENT_AMBULATORY_CARE_PROVIDER_SITE_OTHER): Payer: Self-pay | Admitting: *Deleted

## 2013-08-02 ENCOUNTER — Encounter (INDEPENDENT_AMBULATORY_CARE_PROVIDER_SITE_OTHER): Payer: Self-pay | Admitting: *Deleted

## 2013-08-02 DIAGNOSIS — K5190 Ulcerative colitis, unspecified, without complications: Secondary | ICD-10-CM

## 2013-08-09 ENCOUNTER — Encounter (INDEPENDENT_AMBULATORY_CARE_PROVIDER_SITE_OTHER): Payer: Self-pay | Admitting: *Deleted

## 2013-08-09 ENCOUNTER — Encounter: Payer: Self-pay | Admitting: *Deleted

## 2013-08-09 DIAGNOSIS — K5190 Ulcerative colitis, unspecified, without complications: Secondary | ICD-10-CM | POA: Diagnosis not present

## 2013-08-09 LAB — CBC
HEMATOCRIT: 42.3 % (ref 39.0–52.0)
Hemoglobin: 14.5 g/dL (ref 13.0–17.0)
MCH: 30.5 pg (ref 26.0–34.0)
MCHC: 34.3 g/dL (ref 30.0–36.0)
MCV: 89.1 fL (ref 78.0–100.0)
Platelets: 232 10*3/uL (ref 150–400)
RBC: 4.75 MIL/uL (ref 4.22–5.81)
RDW: 14.8 % (ref 11.5–15.5)
WBC: 9 10*3/uL (ref 4.0–10.5)

## 2013-08-09 LAB — HEPATIC FUNCTION PANEL
ALK PHOS: 62 U/L (ref 39–117)
ALT: 22 U/L (ref 0–53)
AST: 22 U/L (ref 0–37)
Albumin: 4.2 g/dL (ref 3.5–5.2)
BILIRUBIN INDIRECT: 0.6 mg/dL (ref 0.2–1.2)
Bilirubin, Direct: 0.1 mg/dL (ref 0.0–0.3)
TOTAL PROTEIN: 6.7 g/dL (ref 6.0–8.3)
Total Bilirubin: 0.7 mg/dL (ref 0.2–1.2)

## 2013-08-18 ENCOUNTER — Encounter: Payer: Self-pay | Admitting: Internal Medicine

## 2013-08-23 ENCOUNTER — Other Ambulatory Visit: Payer: Self-pay | Admitting: Cardiology

## 2013-08-23 MED ORDER — ISOSORBIDE MONONITRATE ER 30 MG PO TB24: 30.0000 mg | ORAL_TABLET | Freq: Every day | ORAL | Status: DC

## 2013-08-28 ENCOUNTER — Other Ambulatory Visit: Payer: Self-pay | Admitting: Internal Medicine

## 2013-08-30 DIAGNOSIS — H40059 Ocular hypertension, unspecified eye: Secondary | ICD-10-CM | POA: Diagnosis not present

## 2013-09-08 DIAGNOSIS — K5289 Other specified noninfective gastroenteritis and colitis: Secondary | ICD-10-CM | POA: Diagnosis not present

## 2013-09-08 DIAGNOSIS — R5381 Other malaise: Secondary | ICD-10-CM | POA: Diagnosis not present

## 2013-09-08 DIAGNOSIS — Z79899 Other long term (current) drug therapy: Secondary | ICD-10-CM | POA: Diagnosis not present

## 2013-09-08 DIAGNOSIS — I4891 Unspecified atrial fibrillation: Secondary | ICD-10-CM | POA: Diagnosis not present

## 2013-09-08 DIAGNOSIS — E785 Hyperlipidemia, unspecified: Secondary | ICD-10-CM | POA: Diagnosis not present

## 2013-09-08 DIAGNOSIS — R634 Abnormal weight loss: Secondary | ICD-10-CM | POA: Diagnosis not present

## 2013-09-08 DIAGNOSIS — L259 Unspecified contact dermatitis, unspecified cause: Secondary | ICD-10-CM | POA: Diagnosis not present

## 2013-09-08 DIAGNOSIS — I2510 Atherosclerotic heart disease of native coronary artery without angina pectoris: Secondary | ICD-10-CM | POA: Diagnosis not present

## 2013-09-08 DIAGNOSIS — R413 Other amnesia: Secondary | ICD-10-CM | POA: Diagnosis not present

## 2013-09-08 DIAGNOSIS — R5383 Other fatigue: Secondary | ICD-10-CM | POA: Diagnosis not present

## 2013-09-08 DIAGNOSIS — M109 Gout, unspecified: Secondary | ICD-10-CM | POA: Diagnosis not present

## 2013-09-08 DIAGNOSIS — I10 Essential (primary) hypertension: Secondary | ICD-10-CM | POA: Diagnosis not present

## 2013-09-13 DIAGNOSIS — R634 Abnormal weight loss: Secondary | ICD-10-CM | POA: Diagnosis not present

## 2013-09-13 DIAGNOSIS — I4891 Unspecified atrial fibrillation: Secondary | ICD-10-CM | POA: Diagnosis not present

## 2013-09-13 DIAGNOSIS — I2510 Atherosclerotic heart disease of native coronary artery without angina pectoris: Secondary | ICD-10-CM | POA: Diagnosis not present

## 2013-09-13 DIAGNOSIS — L259 Unspecified contact dermatitis, unspecified cause: Secondary | ICD-10-CM | POA: Diagnosis not present

## 2013-09-13 DIAGNOSIS — R413 Other amnesia: Secondary | ICD-10-CM | POA: Diagnosis not present

## 2013-09-13 DIAGNOSIS — K5289 Other specified noninfective gastroenteritis and colitis: Secondary | ICD-10-CM | POA: Diagnosis not present

## 2013-09-13 DIAGNOSIS — M109 Gout, unspecified: Secondary | ICD-10-CM | POA: Diagnosis not present

## 2013-09-13 DIAGNOSIS — Z1331 Encounter for screening for depression: Secondary | ICD-10-CM | POA: Diagnosis not present

## 2013-09-13 DIAGNOSIS — R5381 Other malaise: Secondary | ICD-10-CM | POA: Diagnosis not present

## 2013-09-20 ENCOUNTER — Telehealth (INDEPENDENT_AMBULATORY_CARE_PROVIDER_SITE_OTHER): Payer: Self-pay | Admitting: *Deleted

## 2013-09-20 NOTE — Telephone Encounter (Signed)
Needs his Folic Acid 1 mg refilled. Ann said the pharmacy would not send a refill request to the office. [facility] [place]

## 2013-09-20 NOTE — Telephone Encounter (Signed)
Per Dr.[name] may call in Folic Acid 1 mg - Take 1 by mouth daily #30 with 5 refills. [facility] [address]. [name] was called and made aware.

## 2013-10-06 ENCOUNTER — Encounter: Payer: Self-pay | Admitting: *Deleted

## 2013-10-06 ENCOUNTER — Encounter: Payer: Self-pay | Admitting: Cardiovascular Disease

## 2013-10-06 ENCOUNTER — Ambulatory Visit (INDEPENDENT_AMBULATORY_CARE_PROVIDER_SITE_OTHER): Payer: Medicare Other | Admitting: Cardiovascular Disease

## 2013-10-06 VITALS — BP 116/77 | HR 63 | Ht 73.0 in | Wt 162.1 lb

## 2013-10-06 DIAGNOSIS — I2510 Atherosclerotic heart disease of native coronary artery without angina pectoris: Secondary | ICD-10-CM | POA: Diagnosis not present

## 2013-10-06 DIAGNOSIS — R079 Chest pain, unspecified: Secondary | ICD-10-CM | POA: Diagnosis not present

## 2013-10-06 DIAGNOSIS — I4891 Unspecified atrial fibrillation: Secondary | ICD-10-CM | POA: Diagnosis not present

## 2013-10-06 DIAGNOSIS — Z950 Presence of cardiac pacemaker: Secondary | ICD-10-CM

## 2013-10-06 DIAGNOSIS — E039 Hypothyroidism, unspecified: Secondary | ICD-10-CM

## 2013-10-06 DIAGNOSIS — Z79899 Other long term (current) drug therapy: Secondary | ICD-10-CM | POA: Diagnosis not present

## 2013-10-06 DIAGNOSIS — E785 Hyperlipidemia, unspecified: Secondary | ICD-10-CM

## 2013-10-06 DIAGNOSIS — R5381 Other malaise: Secondary | ICD-10-CM

## 2013-10-06 DIAGNOSIS — R5383 Other fatigue: Secondary | ICD-10-CM

## 2013-10-06 DIAGNOSIS — I10 Essential (primary) hypertension: Secondary | ICD-10-CM

## 2013-10-06 NOTE — Patient Instructions (Addendum)
Your physician has recommended that you have a pulmonary function test. Pulmonary Function Tests are a group of tests that measure how well air moves in and out of your lungs. Your physician has requested that you have a lexiscan myoview. For further information please visit [email or web address]. Please follow instruction sheet, as given. Office will contact with results via phone or letter.   Continue all current medications. Follow up in  1 month

## 2013-10-06 NOTE — Progress Notes (Signed)
Patient ID: [name], male   DOB: 03-24-1935, 79 y.o.   MRN: [id number]      SUBJECTIVE: The patient is a 79 year old male with a history of atrial fibrillation, hypertension, hyperlipidemia, coronary artery disease, and has a pacemaker for tachycardia-bradycardia syndrome. His most recent cardiac catheterization was performed in May 2012 by Dr. Percival Spanish with results noted below. Specifically, he had tandem 50% lesions in the mid LAD. The first obtuse marginal branch was occluded but received collateral flow, and was responsible for a prior defect seen on nuclear stress testing.  He has been feeling fatigued for the past year. He had sharp precordial chest pain which radiated into his left shoulder and arm and lasted several minutes approximately 3 days ago, and this is the first time this has occurred in over a year. He has also had increasing dyspnea with exertion. He does have COPD. He is on amiodarone but denies having had pulmonary function testing. He also has hypothyroidism and is on levothyroxine 50 mcg daily. He used to see Dr. Kern Alberta, but has recently been seeing an internist in [facility] who reportedly specializes in geriatric medicine.  Most recent pacemaker interrogation on 07/15/2013 demonstrated normal device function. There were 11 mode switches but all lasting less than 1 minute, with no high ventricular rates noted. He has had issues with itching in the past relieved with methotrexate. When he came off of it, he had a recurrence of itching and methotrexate was restarted.  ECG performed in the office today demonstrates an atrial paced rhythm and an incomplete right bundle branch block, QRS duration 116 ms.  He is here with his wife. They have a daughter who lives in [place] and another daughter who lives in [place] but is moving to [place] this summer.  Allergies  Allergen Reactions  . Nsaids     colitis    Current Outpatient Prescriptions  Medication Sig Dispense  Refill  . amiodarone (PACERONE) 200 MG tablet TAKE 1 TABLET BY MOUTH EVERY DAY  90 tablet  0  . atenolol (TENORMIN) 100 MG tablet Take 50 mg by mouth 2 (two) times daily.       . Cetirizine HCl (ZYRTEC ALLERGY) 10 MG CAPS Take by mouth.      . Cholecalciferol (VITAMIN D3) 2000 UNITS TABS Take by mouth daily.      . folic acid (FOLVITE) 1 MG tablet Take 1 mg by mouth daily.      . isosorbide mononitrate (IMDUR) 30 MG 24 hr tablet Take 1 tablet (30 mg total) by mouth daily.  90 tablet  1  . levothyroxine (SYNTHROID, LEVOTHROID) 50 MCG tablet       . methotrexate (RHEUMATREX) 2.5 MG tablet       . Multiple Vitamins-Minerals (CENTRUM PO) Take by mouth daily.      . Rivaroxaban (XARELTO) 20 MG TABS Take 20 mg by mouth daily.  90 tablet  2   No current facility-administered medications for this visit.    Past Medical History  Diagnosis Date  . Hypertension   . Dyslipidemia   . Atrial fibrillation   . CAD (coronary artery disease)     a.  cath 5/12: mLAD 50% then tandem 50%, dLAD 30%; D1 30%; OM1 occluded with collateral flow; pRCA 25%; EF 55% with inf HK;  occluded OM likely cause of perfusion defect on Myoview  . Tachy-brady syndrome     s/p pacer  . Ulcerative colitis   . Dizziness   . Nonspecific  abnormal unspecified cardiovascular function study   . GERD (gastroesophageal reflux disease)     Past Surgical History  Procedure Laterality Date  . Implantation of a medtronic dual chamber pacemaker  02/01/06    [name] [name] MD  . Left knee arthroscopy with debridement  04/23/2006    [name] M.D  . Left knee total knee  replacement  05/13/07    [name] M.D.  . Knee surgery    . Back surgery    . Lumbar spine surgery    . Insert / replace / remove pacemaker      MEDTRONIC....PPM Serial Number:  [id number].....Marland KitchenPPM Model Number:  [id number]....Marland KitchenPPM DOI:  02/01/2006    . Cardiac catheterization  09/20/10    EF 55%.....Dr. Percival Spanish    History   Social History  .  Marital Status: Married    Spouse Name: N/A    Number of Children: 3  . Years of Education: N/A   Occupational History  . retired    Social History Main Topics  . Smoking status: Former Smoker -- 1.00 packs/day for 15 years    Types: Cigarettes    Quit date: 05/11/1978  . Smokeless tobacco: Never Used  . Alcohol Use: No  . Drug Use: No  . Sexual Activity: Not on file   Other Topics Concern  . Not on file   Social History Narrative  . No narrative on file     Filed Vitals:   10/06/13 1014  BP: 116/77  Pulse: 63  Height: 6' 1"  (1.854 m)  Weight: 162 lb 1.9 oz (73.537 kg)    PHYSICAL EXAM General: NAD Neck: No JVD, no thyromegaly. Lungs: Clear to auscultation bilaterally with normal respiratory effort. CV: Nondisplaced PMI.  Regular rate and rhythm, normal S1/S2, no S3/S4, no murmur. No pretibial or periankle edema.  No carotid bruit.  Normal pedal pulses.  Abdomen: Soft, nontender, no hepatosplenomegaly, no distention.  Neurologic: Alert and oriented x 3.  Psych: Normal affect. Extremities: No clubbing or cyanosis.   ECG: reviewed and available in electronic records.      ASSESSMENT AND PLAN: 1. Chest pain and increasing fatigue and dyspnea on exertion, in the setting of known CAD: I wonder if he has had a progression of his tandem mid LAD 50% lesions noted in May 2012. I will proceed with a Lexiscan Cardiolite stress test to evaluate for ischemia. For the time being, I will continue current medical therapy with atenolol 50 mg twice daily and Imdur 30 mg daily. His most recent LFTs were normal. I would consider starting statin therapy for its pleiotropic effects. 2. Atrial fibrillation: Device interrogation in March only demonstrated 11 mode switches all lasting less than 1 minute, with no high ventricular rates noted. Continue atenolol 50 mg twice daily and Xarelto 20 mg daily. Given that he is on amiodarone 200 mg daily, I will obtain pulmonary function testing,  especially given his increasing dyspnea with exertion. He reportedly also has COPD. I will defer to Dr. Rayann Heman for reducing the dose of amiodarone to 100 mg daily, as suggested at his prior office visit with him. He has a history of hypothyroidism and is on levothyroxine. 3. HTN: Controlled on current therapy. 4. Pacemaker: Device interrogation in March only demonstrated 11 mode switches all lasting less than 1 minute, with no high ventricular rates noted. Sees Dr. Rayann Heman in August. 5. Hyperlipidemia: I will try and obtain the results of his most recent lipid panel. Notably he is  not on statin therapy.  Dispo: f/u 1 month.  Time spent: 40 minutes  Kate Sable, M.D., F.A.C.C.

## 2013-10-12 ENCOUNTER — Encounter (HOSPITAL_COMMUNITY)
Admission: RE | Admit: 2013-10-12 | Discharge: 2013-10-12 | Disposition: A | Discharge status: Home / Self Care | Payer: Medicare Other | Source: Ambulatory Visit | Attending: Cardiovascular Disease | Admitting: Cardiovascular Disease

## 2013-10-12 ENCOUNTER — Ambulatory Visit (HOSPITAL_COMMUNITY)
Admission: RE | Admit: 2013-10-12 | Discharge: 2013-10-12 | Disposition: A | Discharge status: Home / Self Care | Payer: Medicare Other | Source: Ambulatory Visit | Attending: Cardiovascular Disease | Admitting: Cardiovascular Disease

## 2013-10-12 ENCOUNTER — Encounter (HOSPITAL_COMMUNITY): Payer: Self-pay

## 2013-10-12 DIAGNOSIS — R5381 Other malaise: Secondary | ICD-10-CM | POA: Diagnosis not present

## 2013-10-12 DIAGNOSIS — I2510 Atherosclerotic heart disease of native coronary artery without angina pectoris: Secondary | ICD-10-CM | POA: Insufficient documentation

## 2013-10-12 DIAGNOSIS — I4891 Unspecified atrial fibrillation: Secondary | ICD-10-CM

## 2013-10-12 DIAGNOSIS — R5383 Other fatigue: Secondary | ICD-10-CM

## 2013-10-12 DIAGNOSIS — R079 Chest pain, unspecified: Secondary | ICD-10-CM | POA: Insufficient documentation

## 2013-10-12 MED ORDER — REGADENOSON 0.4 MG/5ML IV SOLN
INTRAVENOUS | Status: AC
  Administered 2013-10-12: 0.4 mg via INTRAVENOUS
  Filled 2013-10-12: qty 5

## 2013-10-12 MED ORDER — TECHNETIUM TC 99M SESTAMIBI - CARDIOLITE
30.0000 | Freq: Once | INTRAVENOUS | Status: AC | PRN
  Administered 2013-10-12: 10:00:00 30 via INTRAVENOUS

## 2013-10-12 MED ORDER — SODIUM CHLORIDE 0.9 % IJ SOLN
INTRAMUSCULAR | Status: AC
  Administered 2013-10-12: 10 mL via INTRAVENOUS
  Filled 2013-10-12: qty 10

## 2013-10-12 MED ORDER — TECHNETIUM TC 99M SESTAMIBI GENERIC - CARDIOLITE
10.0000 | Freq: Once | INTRAVENOUS | Status: AC | PRN
  Administered 2013-10-12: 10 via INTRAVENOUS

## 2013-10-12 NOTE — Progress Notes (Signed)
Stress Lab Nurses Notes - [name]  [name] 10/12/2013 Reason for doing test: CAD, Chest Pain and fatigue Type of test: [name] Nurse performing test: Gerrit Halls, RN Nuclear Medicine Tech: Dyanne Carrel Echo Tech: Not Applicable MD performing test: Koneswaran/K.Lawrence NP Family MD: [name] Test explained and consent signed: yes IV started: 22g jelco, Saline lock flushed, No redness or edema and Saline lock started in radiology Symptoms: stomach discomfort Treatment/Intervention: None Reason test stopped: protocol completed After recovery IV was: Discontinued via X-ray tech and No redness or edema Patient to return to [facility]. Med at : 10:50 Patient discharged: Home Patient's Condition upon discharge was: stable Comments: During test BP 128/83 & HR 61.  Recovery BP 166/86 & HR 60.  Symptoms resolved in recovery. [name]

## 2013-10-16 ENCOUNTER — Ambulatory Visit (INDEPENDENT_AMBULATORY_CARE_PROVIDER_SITE_OTHER): Payer: Medicare Other | Admitting: *Deleted

## 2013-10-16 ENCOUNTER — Telehealth: Payer: Self-pay | Admitting: *Deleted

## 2013-10-16 DIAGNOSIS — I495 Sick sinus syndrome: Secondary | ICD-10-CM

## 2013-10-16 DIAGNOSIS — I4891 Unspecified atrial fibrillation: Secondary | ICD-10-CM | POA: Diagnosis not present

## 2013-10-16 NOTE — Telephone Encounter (Signed)
Message copied by Laurine Blazer on Mon Oct 16, 2013  3:03 PM ------      Message from: Kate Sable A      Created: Thu Oct 12, 2013 12:21 PM       Will review at [facility]. ------

## 2013-10-16 NOTE — Progress Notes (Signed)
Remote pacemaker transmission.   

## 2013-10-16 NOTE — Telephone Encounter (Signed)
Notes Recorded by Laurine Blazer, LPN on 05/12/2581 at 4:62 PM Wife [name]) & patient notified. Follow up scheduled for 11/09/2013 with Dr. Bronson Ing. ------

## 2013-10-18 ENCOUNTER — Ambulatory Visit (HOSPITAL_COMMUNITY)
Admission: RE | Admit: 2013-10-18 | Discharge: 2013-10-18 | Disposition: A | Discharge status: Home / Self Care | Payer: Medicare Other | Source: Ambulatory Visit | Attending: Cardiovascular Disease | Admitting: Cardiovascular Disease

## 2013-10-18 DIAGNOSIS — Z79899 Other long term (current) drug therapy: Secondary | ICD-10-CM | POA: Insufficient documentation

## 2013-10-18 MED ORDER — ALBUTEROL SULFATE (2.5 MG/3ML) 0.083% IN NEBU
2.5000 mg | INHALATION_SOLUTION | Freq: Once | RESPIRATORY_TRACT | Status: AC
  Administered 2013-10-18: 2.5 mg via RESPIRATORY_TRACT

## 2013-10-20 DIAGNOSIS — I2510 Atherosclerotic heart disease of native coronary artery without angina pectoris: Secondary | ICD-10-CM | POA: Diagnosis not present

## 2013-10-20 DIAGNOSIS — J449 Chronic obstructive pulmonary disease, unspecified: Secondary | ICD-10-CM | POA: Diagnosis not present

## 2013-10-20 DIAGNOSIS — E039 Hypothyroidism, unspecified: Secondary | ICD-10-CM | POA: Diagnosis not present

## 2013-10-20 DIAGNOSIS — IMO0002 Reserved for concepts with insufficient information to code with codable children: Secondary | ICD-10-CM | POA: Diagnosis not present

## 2013-10-20 DIAGNOSIS — R413 Other amnesia: Secondary | ICD-10-CM | POA: Diagnosis not present

## 2013-10-20 DIAGNOSIS — L259 Unspecified contact dermatitis, unspecified cause: Secondary | ICD-10-CM | POA: Diagnosis not present

## 2013-10-20 LAB — MDC_IDC_ENUM_SESS_TYPE_REMOTE
Battery Impedance: 1980 Ohm
Battery Voltage: 2.72 V
Brady Statistic AP VP Percent: 4 %
Brady Statistic AP VS Percent: 96 %
Brady Statistic AS VP Percent: 0 %
Lead Channel Impedance Value: 547 Ohm
Lead Channel Pacing Threshold Amplitude: 0.625 V
Lead Channel Pacing Threshold Amplitude: 0.75 V
Lead Channel Pacing Threshold Pulse Width: 0.4 ms
Lead Channel Sensing Intrinsic Amplitude: 5.6 mV
MDC IDC MSMT BATTERY REMAINING LONGEVITY: 25 mo
MDC IDC MSMT LEADCHNL RV IMPEDANCE VALUE: 466 Ohm
MDC IDC MSMT LEADCHNL RV PACING THRESHOLD PULSEWIDTH: 0.4 ms
MDC IDC SESS DTM: 20150608113327
MDC IDC SET LEADCHNL RA PACING AMPLITUDE: 2 V
MDC IDC SET LEADCHNL RV PACING AMPLITUDE: 2.5 V
MDC IDC SET LEADCHNL RV PACING PULSEWIDTH: 0.4 ms
MDC IDC SET LEADCHNL RV SENSING SENSITIVITY: 2.8 mV
MDC IDC STAT BRADY AS VS PERCENT: 0 %

## 2013-10-21 LAB — PULMONARY FUNCTION TEST
DL/VA % pred: 60 %
DL/VA: 2.92 ml/min/mmHg/L
DLCO COR: 18.92 ml/min/mmHg
DLCO cor % pred: 50 %
DLCO unc % pred: 50 %
DLCO unc: 18.92 ml/min/mmHg
FEF 25-75 PRE: 0.86 L/s
FEF 25-75 Post: 0.95 L/sec
FEF2575-%CHANGE-POST: 10 %
FEF2575-%Pred-Post: 38 %
FEF2575-%Pred-Pre: 34 %
FEV1-%CHANGE-POST: 7 %
FEV1-%PRED-PRE: 53 %
FEV1-%Pred-Post: 56 %
FEV1-Post: 1.99 L
FEV1-Pre: 1.85 L
FEV1FVC-%Change-Post: 2 %
FEV1FVC-%Pred-Pre: 69 %
FEV6-%Change-Post: 3 %
FEV6-%PRED-POST: 80 %
FEV6-%PRED-PRE: 77 %
FEV6-PRE: 3.53 L
FEV6-Post: 3.65 L
FEV6FVC-%Change-Post: 0 %
FEV6FVC-%PRED-PRE: 101 %
FEV6FVC-%Pred-Post: 100 %
FVC-%Change-Post: 4 %
FVC-%PRED-PRE: 76 %
FVC-%Pred-Post: 79 %
FVC-POST: 3.85 L
FVC-Pre: 3.7 L
POST FEV1/FVC RATIO: 52 %
POST FEV6/FVC RATIO: 95 %
Pre FEV1/FVC ratio: 50 %
Pre FEV6/FVC Ratio: 95 %
RV % pred: 106 %
RV: 3.01 L
TLC % PRED: 93 %
TLC: 7.36 L

## 2013-10-23 ENCOUNTER — Telehealth (INDEPENDENT_AMBULATORY_CARE_PROVIDER_SITE_OTHER): Payer: Self-pay | Admitting: *Deleted

## 2013-10-23 NOTE — Telephone Encounter (Signed)
Message on Voice mail Friday 2:38  Needs to make a referral to [place]   Call Maudie Mercury at Dr. Shanda Bumps [phone]

## 2013-10-23 NOTE — Telephone Encounter (Signed)
Thales already has a f/u apt on 12/11/13 with Dr. Laural Golden.

## 2013-10-24 ENCOUNTER — Telehealth: Payer: Self-pay | Admitting: *Deleted

## 2013-10-24 NOTE — Telephone Encounter (Signed)
Notes Recorded by Laurine Blazer, LPN on 3/58/2518 at 9:84 PM Wife [name]) notified. States husband does have a Technical brewer in [place] (Dr. Audie Box). Stated he saw PMD last Friday & was given an inhaler. Advised she may break the 236m tablet of the Amiodarone in half to take daily. Informed wife that note & test results will be forwarded to Pulmonologist. ------ Notes Recorded by ALaurine Blazer LPN on 62/10/3128at 11:18PM ----- Message -----  From: SHerminio Commons MD  Sent: 10/20/2013 8:57 AM  To: HTruett Mainland LPN   Please reduce amio to 100 mg daily. Thanks.   ----- Message -----  From: JThompson Grayer MD  Sent: 10/19/2013 10:09 PM  To: SHerminio Commons MD   Go ahead and decrease amiodarone to 1014mdaily  We may need to stop it but lets see what pulmonary says first.   ----- Message -----  From: SuHerminio CommonsMD  Sent: 10/18/2013 4:30 PM  To: JaThompson GrayerMD   Thoughts on amiodarone given the PFT results?  ------ Notes Recorded by SuHerminio CommonsMD on 10/18/2013 at 4:31 PM Would have him f/u with pulmonary. ------ Notes Recorded by SuHerminio CommonsMD on 10/18/2013 at 4:31 PM Significant COPD and restrictive disease. I have asked Dr. AlRayann Hemanbout his thoughts on continued amiodarone use.

## 2013-11-06 DIAGNOSIS — K5190 Ulcerative colitis, unspecified, without complications: Secondary | ICD-10-CM | POA: Diagnosis not present

## 2013-11-06 DIAGNOSIS — R0902 Hypoxemia: Secondary | ICD-10-CM | POA: Diagnosis not present

## 2013-11-06 DIAGNOSIS — I4891 Unspecified atrial fibrillation: Secondary | ICD-10-CM | POA: Diagnosis not present

## 2013-11-06 DIAGNOSIS — R918 Other nonspecific abnormal finding of lung field: Secondary | ICD-10-CM | POA: Diagnosis not present

## 2013-11-06 DIAGNOSIS — R9389 Abnormal findings on diagnostic imaging of other specified body structures: Secondary | ICD-10-CM | POA: Diagnosis not present

## 2013-11-06 DIAGNOSIS — J449 Chronic obstructive pulmonary disease, unspecified: Secondary | ICD-10-CM | POA: Diagnosis not present

## 2013-11-06 DIAGNOSIS — R490 Dysphonia: Secondary | ICD-10-CM | POA: Diagnosis not present

## 2013-11-06 DIAGNOSIS — R942 Abnormal results of pulmonary function studies: Secondary | ICD-10-CM | POA: Diagnosis not present

## 2013-11-07 ENCOUNTER — Encounter: Payer: Self-pay | Admitting: Cardiology

## 2013-11-07 DIAGNOSIS — L570 Actinic keratosis: Secondary | ICD-10-CM | POA: Diagnosis not present

## 2013-11-07 DIAGNOSIS — Z85828 Personal history of other malignant neoplasm of skin: Secondary | ICD-10-CM | POA: Diagnosis not present

## 2013-11-09 ENCOUNTER — Ambulatory Visit (INDEPENDENT_AMBULATORY_CARE_PROVIDER_SITE_OTHER): Payer: Medicare Other | Admitting: Cardiovascular Disease

## 2013-11-09 ENCOUNTER — Encounter: Payer: Self-pay | Admitting: Cardiovascular Disease

## 2013-11-09 VITALS — BP 95/58 | HR 65 | Ht 73.0 in | Wt 161.0 lb

## 2013-11-09 DIAGNOSIS — R5383 Other fatigue: Secondary | ICD-10-CM

## 2013-11-09 DIAGNOSIS — E038 Other specified hypothyroidism: Secondary | ICD-10-CM

## 2013-11-09 DIAGNOSIS — I495 Sick sinus syndrome: Secondary | ICD-10-CM

## 2013-11-09 DIAGNOSIS — R5381 Other malaise: Secondary | ICD-10-CM

## 2013-11-09 DIAGNOSIS — I4891 Unspecified atrial fibrillation: Secondary | ICD-10-CM

## 2013-11-09 DIAGNOSIS — I209 Angina pectoris, unspecified: Secondary | ICD-10-CM | POA: Diagnosis not present

## 2013-11-09 DIAGNOSIS — Z79899 Other long term (current) drug therapy: Secondary | ICD-10-CM | POA: Diagnosis not present

## 2013-11-09 DIAGNOSIS — E785 Hyperlipidemia, unspecified: Secondary | ICD-10-CM

## 2013-11-09 DIAGNOSIS — I2510 Atherosclerotic heart disease of native coronary artery without angina pectoris: Secondary | ICD-10-CM

## 2013-11-09 DIAGNOSIS — I480 Paroxysmal atrial fibrillation: Secondary | ICD-10-CM

## 2013-11-09 DIAGNOSIS — I10 Essential (primary) hypertension: Secondary | ICD-10-CM

## 2013-11-09 DIAGNOSIS — J449 Chronic obstructive pulmonary disease, unspecified: Secondary | ICD-10-CM

## 2013-11-09 DIAGNOSIS — Z950 Presence of cardiac pacemaker: Secondary | ICD-10-CM

## 2013-11-09 DIAGNOSIS — R079 Chest pain, unspecified: Secondary | ICD-10-CM

## 2013-11-09 DIAGNOSIS — I25119 Atherosclerotic heart disease of native coronary artery with unspecified angina pectoris: Secondary | ICD-10-CM

## 2013-11-09 MED ORDER — ISOSORBIDE MONONITRATE ER 60 MG PO TB24: 60.0000 mg | ORAL_TABLET | Freq: Every day | ORAL | Status: DC

## 2013-11-09 NOTE — Patient Instructions (Signed)
   Increase Imdur to 49m daily - new sent for 90 day + 3 refills to [facility] Continue all other medications.   Follow up in  3 months

## 2013-11-09 NOTE — Progress Notes (Signed)
Patient ID: DAQUAN CRAPPS, male   DOB: 09/26/1934, 79 y.o.   MRN: [id number]      SUBJECTIVE: The patient is here to followup on the results of cardiovascular testing performed for the evaluation of chest pain and increasing dyspnea on exertion.  In summary, he is a 79 year old male with a history of atrial fibrillation, hypertension, hyperlipidemia, coronary artery disease, and has a pacemaker for tachycardia-bradycardia syndrome. His most recent cardiac catheterization was performed in May 2012 by Dr. Percival Spanish which demonstrated tandem 50% lesions in the mid LAD. The first obtuse marginal branch was occluded but received collateral flow, and was responsible for a prior defect seen on nuclear stress testing.   He has been feeling fatigued for the past year. He does have COPD. He is on amiodarone, now reduced to 100 mg daily, and has hypothyroidism and is on levothyroxine 50 mcg daily.  He used to see Dr. Kern Alberta, but has recently been seeing an internist in [facility] who reportedly specializes in geriatric medicine.   Most recent pacemaker interrogation on 10/20/2013 demonstrated normal device function. There were 60 mode switches with no high ventricular rates noted.  He has had issues with itching in the past relieved with methotrexate. When he came off of it, he had a recurrence of itching and methotrexate was restarted.   Lexiscan Cardiolite stress test demonstrated a fixed inferolateral defect which was consistent with myocardial scar versus attenuation artifact. PFTs demonstrated moderate airflow obstruction with moderate to severely reduced DLCO. He sees a pulmonologist in [place], Dr. Audie Box.  Dr. Elsworth Soho has prescribed Symbicort, although they tell me Dr. Audie Box does not feel it's necessary. The patient and his wife would like to try it. He has also had memory problems as of late. He's had no further episodes of chest pain. He is apparently going to have a CT of his chest by Dr.  Audie Box. He's had some vocal cord problems as well.  Soc: He is here with his wife. They have a daughter who lives in [place] and another daughter who lives in [place] but is moving to [place] this summer.     Allergies  Allergen Reactions  . Nsaids     colitis    Current Outpatient Prescriptions  Medication Sig Dispense Refill  . amiodarone (PACERONE) 200 MG tablet Take one-half tablet by mouth daily      . atenolol (TENORMIN) 100 MG tablet Take 50 mg by mouth 2 (two) times daily.       . budesonide-formoterol (SYMBICORT) 160-4.5 MCG/ACT inhaler Inhale 2 puffs into the lungs 2 (two) times daily.      . Cetirizine HCl (ZYRTEC ALLERGY) 10 MG CAPS Take 1 capsule by mouth daily.       . Cholecalciferol (D-3-5) 5000 UNITS capsule Take 5,000 Units by mouth daily.      . folic acid (FOLVITE) 1 MG tablet Take 1 mg by mouth daily.      . isosorbide mononitrate (IMDUR) 30 MG 24 hr tablet Take 1 tablet (30 mg total) by mouth daily.  90 tablet  1  . levothyroxine (SYNTHROID, LEVOTHROID) 50 MCG tablet       . methotrexate (RHEUMATREX) 2.5 MG tablet Take 10 mg by mouth once a week.       . Multiple Vitamins-Minerals (CENTRUM PO) Take by mouth daily.      . Rivaroxaban (XARELTO) 20 MG TABS Take 20 mg by mouth daily.  90 tablet  2   No current facility-administered medications for  this visit.    Past Medical History  Diagnosis Date  . Hypertension   . Dyslipidemia   . Atrial fibrillation   . CAD (coronary artery disease)     a.  cath 5/12: mLAD 50% then tandem 50%, dLAD 30%; D1 30%; OM1 occluded with collateral flow; pRCA 25%; EF 55% with inf HK;  occluded OM likely cause of perfusion defect on Myoview  . Tachy-brady syndrome     s/p pacer  . Ulcerative colitis   . Dizziness   . Nonspecific abnormal unspecified cardiovascular function study   . GERD (gastroesophageal reflux disease)     Past Surgical History  Procedure Laterality Date  . Implantation of a medtronic dual chamber pacemaker   02/01/06    [name] [name] MD  . Left knee arthroscopy with debridement  04/23/2006    [name] M.D  . Left knee total knee  replacement  05/13/07    [name] M.D.  . Knee surgery    . Back surgery    . Lumbar spine surgery    . Insert / replace / remove pacemaker      MEDTRONIC....PPM Serial Number:  [id number].....Marland KitchenPPM Model Number:  [id number]....Marland KitchenPPM DOI:  02/01/2006    . Cardiac catheterization  09/20/10    EF 55%.....Dr. Percival Spanish    History   Social History  . Marital Status: Married    Spouse Name: N/A    Number of Children: 3  . Years of Education: N/A   Occupational History  . retired    Social History Main Topics  . Smoking status: Former Smoker -- 1.00 packs/day for 15 years    Types: Cigarettes    Quit date: 05/11/1978  . Smokeless tobacco: Never Used  . Alcohol Use: No  . Drug Use: No  . Sexual Activity: Not on file   Other Topics Concern  . Not on file   Social History Narrative  . No narrative on file     Filed Vitals:   11/09/13 0906  BP: 95/58  Pulse: 65  Height: 6' 1"  (1.854 m)  Weight: 161 lb (73.029 kg)    PHYSICAL EXAM General: NAD Neck: No JVD, no thyromegaly. Lungs: Diminished b/l but no rales or wheezes. CV: Nondisplaced PMI.  Regular rate and rhythm, normal S1/S2, no S3/S4, no murmur. No pretibial or periankle edema.  No carotid bruit.  Normal pedal pulses.  Abdomen: Soft, nontender, no hepatosplenomegaly, no distention.  Neurologic: Alert and oriented x 3.  Psych: Normal affect. Extremities: No clubbing or cyanosis.   ECG: reviewed and available in electronic records.      ASSESSMENT AND PLAN: 1. Chest pain and increasing fatigue and dyspnea on exertion, in the setting of known CAD: Lexiscan Cardiolite stress test did not demonstrate any evidence of ischemia. I will continue current medical therapy with atenolol 50 mg twice daily and increase Imdur to 60 mg daily. His most recent LFTs were normal. I would  consider starting statin therapy for its pleiotropic effects. If his symptoms were to progress, I would not rule out the possibility of coronary angiography. 2. Atrial fibrillation: Device interrogation in June demonstrated 60 mode switches with no high ventricular rates noted. Continue atenolol 50 mg twice daily and Xarelto 20 mg daily. Now on amiodarone 100 mg daily. He has a history of hypothyroidism and is on levothyroxine.  3. HTN: Controlled on current therapy.  4. Pacemaker: Device interrogation in June demonstrated 60 mode switches, with no high ventricular rates noted.  Sees Dr. Rayann Heman in August.  5. Hyperlipidemia: I will try and obtain the results of his most recent lipid panel. Notably he is not on statin therapy.  6. COPD: Now on Symbicort. Followed in [place] by Dr. Audie Box. Soon to have chest CT. PFT results noted above.  Dispo: f/u 3 months.   Kate Sable, M.D., F.A.C.C.

## 2013-11-20 DIAGNOSIS — J61 Pneumoconiosis due to asbestos and other mineral fibers: Secondary | ICD-10-CM | POA: Diagnosis not present

## 2013-11-20 DIAGNOSIS — R918 Other nonspecific abnormal finding of lung field: Secondary | ICD-10-CM | POA: Diagnosis not present

## 2013-11-20 DIAGNOSIS — R9389 Abnormal findings on diagnostic imaging of other specified body structures: Secondary | ICD-10-CM | POA: Diagnosis not present

## 2013-11-21 ENCOUNTER — Encounter: Payer: Self-pay | Admitting: Internal Medicine

## 2013-12-11 ENCOUNTER — Encounter (INDEPENDENT_AMBULATORY_CARE_PROVIDER_SITE_OTHER): Payer: Self-pay | Admitting: Internal Medicine

## 2013-12-11 ENCOUNTER — Ambulatory Visit (INDEPENDENT_AMBULATORY_CARE_PROVIDER_SITE_OTHER): Payer: Medicare Other | Admitting: Internal Medicine

## 2013-12-11 VITALS — BP 116/72 | HR 76 | Temp 97.7°F | Resp 18 | Ht 73.0 in | Wt 163.8 lb

## 2013-12-11 DIAGNOSIS — R5381 Other malaise: Secondary | ICD-10-CM | POA: Diagnosis not present

## 2013-12-11 DIAGNOSIS — K5190 Ulcerative colitis, unspecified, without complications: Secondary | ICD-10-CM | POA: Diagnosis not present

## 2013-12-11 DIAGNOSIS — K51918 Ulcerative colitis, unspecified with other complication: Secondary | ICD-10-CM

## 2013-12-11 DIAGNOSIS — R5383 Other fatigue: Secondary | ICD-10-CM

## 2013-12-11 DIAGNOSIS — R531 Weakness: Secondary | ICD-10-CM

## 2013-12-11 DIAGNOSIS — I2510 Atherosclerotic heart disease of native coronary artery without angina pectoris: Secondary | ICD-10-CM

## 2013-12-11 MED ORDER — MESALAMINE 400 MG PO CPDR: 1600.0000 mg | DELAYED_RELEASE_CAPSULE | Freq: Two times a day (BID) | ORAL | Status: DC

## 2013-12-11 NOTE — Progress Notes (Signed)
Presenting complaint;  Followup for ulcerative colitis.  Subjective:  Patient is 79 year old Caucasian male who has chronic ulcerative colitis and presents for scheduled visit accompanied by his wife. He was last seen in January this year. He has no GI complaints. He is having 1 formed stool daily. He denies abdominal pain melena or rectal bleeding. He has lost 6 pounds in the last 7 months. He has no appetite but he states when he sits down to eat he eats a normal meal. His wife agrees. He complains of having no energy whatsoever. He says all he wants to do is sit. He wants to come off methotrexate and so does his wife and daughter. He is now under care of Dr. Elsworth Soho and was recently diagnosed with hypothyroidism and is on levothyroxine.    Current Medications: Outpatient Encounter Prescriptions as of 12/11/2013  Medication Sig  . amiodarone (PACERONE) 200 MG tablet Take one-half tablet by mouth daily  . atenolol (TENORMIN) 100 MG tablet Take 50 mg by mouth 2 (two) times daily.   . Cetirizine HCl (ZYRTEC ALLERGY) 10 MG CAPS Take 1 capsule by mouth daily.   . Cholecalciferol (D-3-5) 5000 UNITS capsule Take 5,000 Units by mouth daily.  . folic acid (FOLVITE) 1 MG tablet Take 1 mg by mouth daily.  . isosorbide mononitrate (IMDUR) 60 MG 24 hr tablet Take 1 tablet (60 mg total) by mouth daily.  Marland Kitchen levothyroxine (SYNTHROID, LEVOTHROID) 50 MCG tablet Take 50 mcg by mouth daily before breakfast.   . MELATONIN MAXIMUM STRENGTH PO Take 1 tablet by mouth at bedtime. This includes Theanine , GABA  . methotrexate (RHEUMATREX) 2.5 MG tablet Take 10 mg by mouth once a week.   . Multiple Vitamins-Minerals (CENTRUM PO) Take by mouth daily.  . Rivaroxaban (XARELTO) 20 MG TABS Take 20 mg by mouth daily.  . [DISCONTINUED] budesonide-formoterol (SYMBICORT) 160-4.5 MCG/ACT inhaler Inhale 2 puffs into the lungs 2 (two) times daily.     Objective: Blood pressure 116/72, pulse 76, temperature 97.7 F (36.5 C),  temperature source Oral, resp. rate 18, height 6' 1"  (1.854 m), weight 163 lb 12.8 oz (74.299 kg). Patient is alert and in no acute distress. Conjunctiva is pink. Sclera is nonicteric Oropharyngeal mucosa is normal. No neck masses or thyromegaly noted. Cardiac exam with regular rhythm normal S1 and S2. No murmur or gallop noted. Lungs are clear to auscultation. Abdomen is flat. Bowel sounds are normal. On palpation abdomen is soft and nontender without organomegaly or masses.  No LE edema or clubbing noted.  Labs/studies Results: Lab data from 08/09/2013  WBC 9.0, H&H 14.5 and 42.3 and platelet count 232K  Bilirubin 0.7, AP 62, AST 22, ALT 22 and albumin 4.2.   Assessment:  #1. Chronic ulcerative colitis. He presently appears to be in remission. Last colonoscopy was in July 2011 revealing disease involving sigmoid colon and rectum. Patient has been in remission for over 2 years. He fully understands the risks of discontinuing methotrexate as his disease may not be well controlled with mesalamine alone but he wants to try it anyway. If his disease relapses he is at risk for significant bleed given that he is on anticoagulant. #2. Weakness. Suspect this symptom is secondary to his medications. He may also be depressed. It remains to be seen if coming off methotrexate will help his weakness. Recent H&H was normal.  Plan:  Begin Delzicol 1.6 g by mouth twice a day. Stop methotrexate after 6 weeks. Call for diarrhea or rectal bleeding.  Office visit in 6 months. Followup with Dr. Elsworth Soho if weakness does not improve when he stops methotrexate.

## 2013-12-11 NOTE — Patient Instructions (Addendum)
Stop methotrexate after you have been on Delzicol for 6 weeks. Notify if you have rectal bleeding or diarrhea.

## 2013-12-15 ENCOUNTER — Telehealth (INDEPENDENT_AMBULATORY_CARE_PROVIDER_SITE_OTHER): Payer: Self-pay | Admitting: *Deleted

## 2013-12-15 NOTE — Telephone Encounter (Signed)
We rec'd a fax from [facility]. The prescription was sent in under Asacol 400 mg. This is no longer made they sent alteratives. This was to have been for Delzicol 400 mg take 4 capsules by mouth two times a day. #730 ,3 refills. I talked with the Pharmacist Chris/[facility] and he took the above RX.

## 2013-12-18 ENCOUNTER — Telehealth (INDEPENDENT_AMBULATORY_CARE_PROVIDER_SITE_OTHER): Payer: Self-pay | Admitting: *Deleted

## 2013-12-18 NOTE — Telephone Encounter (Signed)
Forwarded to [place] for review.

## 2013-12-18 NOTE — Telephone Encounter (Signed)
Talked with patient's wife. She will try to find out medicine cost if she paid on her own or [place] from [place]

## 2013-12-18 NOTE — Telephone Encounter (Signed)
Wife [name]) called in this morning.  Saw Dr. Laural Golden last week and he changed his medication.  Dr.[name] wanted to know if this was expensive--[phone]  Is there something we can do to help or change his medication again?  She will be out until 11:30 this morning so call after that

## 2013-12-19 NOTE — Telephone Encounter (Signed)
Would like for [name] to call her at [phone]. She is wanting to know the mg for the Apriso he was on last year.

## 2013-12-21 NOTE — Telephone Encounter (Signed)
Patient was called and a message was left with the following information. The medication was Apriso 0.375 mg.

## 2014-01-05 ENCOUNTER — Ambulatory Visit (INDEPENDENT_AMBULATORY_CARE_PROVIDER_SITE_OTHER): Payer: Medicare Other | Admitting: Internal Medicine

## 2014-01-05 ENCOUNTER — Encounter: Payer: Self-pay | Admitting: Internal Medicine

## 2014-01-05 VITALS — BP 113/74 | HR 71 | Ht 73.5 in | Wt 160.0 lb

## 2014-01-05 DIAGNOSIS — R5383 Other fatigue: Secondary | ICD-10-CM

## 2014-01-05 DIAGNOSIS — R5381 Other malaise: Secondary | ICD-10-CM

## 2014-01-05 DIAGNOSIS — R5382 Chronic fatigue, unspecified: Secondary | ICD-10-CM

## 2014-01-05 DIAGNOSIS — I495 Sick sinus syndrome: Secondary | ICD-10-CM | POA: Diagnosis not present

## 2014-01-05 DIAGNOSIS — I2510 Atherosclerotic heart disease of native coronary artery without angina pectoris: Secondary | ICD-10-CM | POA: Diagnosis not present

## 2014-01-05 DIAGNOSIS — I4891 Unspecified atrial fibrillation: Secondary | ICD-10-CM | POA: Diagnosis not present

## 2014-01-05 DIAGNOSIS — I480 Paroxysmal atrial fibrillation: Secondary | ICD-10-CM

## 2014-01-05 LAB — MDC_IDC_ENUM_SESS_TYPE_INCLINIC
Battery Remaining Longevity: 24 mo
Battery Voltage: 2.72 V
Brady Statistic AS VP Percent: 0 %
Lead Channel Pacing Threshold Amplitude: 0.5 V
Lead Channel Pacing Threshold Amplitude: 1 V
Lead Channel Pacing Threshold Pulse Width: 0.4 ms
Lead Channel Pacing Threshold Pulse Width: 0.4 ms
Lead Channel Setting Pacing Amplitude: 2 V
Lead Channel Setting Pacing Amplitude: 2.5 V
Lead Channel Setting Pacing Pulse Width: 0.4 ms
Lead Channel Setting Sensing Sensitivity: 4 mV
MDC IDC MSMT BATTERY IMPEDANCE: 2082 Ohm
MDC IDC MSMT LEADCHNL RA IMPEDANCE VALUE: 531 Ohm
MDC IDC MSMT LEADCHNL RA SENSING INTR AMPL: 4 mV
MDC IDC MSMT LEADCHNL RV IMPEDANCE VALUE: 424 Ohm
MDC IDC MSMT LEADCHNL RV SENSING INTR AMPL: 11.2 mV
MDC IDC SESS DTM: 20150828141622
MDC IDC STAT BRADY AP VP PERCENT: 3 %
MDC IDC STAT BRADY AP VS PERCENT: 96 %
MDC IDC STAT BRADY AS VS PERCENT: 0 %

## 2014-01-05 MED ORDER — ATENOLOL 100 MG PO TABS: 50.0000 mg | ORAL_TABLET | Freq: Every evening | ORAL | Status: DC

## 2014-01-05 NOTE — Patient Instructions (Signed)
Your physician recommends that you schedule a follow-up appointment in: 1 year. You will receive a reminder letter in the mail in about 10 months reminding you to call and schedule your appointment. If you don't receive this letter, please contact our office. Carelink/device check 04/09/14 from home. Your physician has recommended you make the following change in your medication:  Decrease your atenolol 100 mg to taking 1/2 tablet in the evening only. Continue all other medications the same.

## 2014-01-05 NOTE — Progress Notes (Signed)
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 male who presents today for routine electrophysiology followup.  Since last being seen in our clinic, the patient reports doing very well.    He is tolerating xarelto without bleeding.  His primary concern is fatigue.  He feels that this is due to atenolol. Today, he denies symptoms of palpitations, chest pain, shortness of breath,  lower extremity edema, dizziness, presyncope, or syncope.  The patient is otherwise without complaint today.   Past Medical History  Diagnosis Date  . Hypertension   . Dyslipidemia   . Atrial fibrillation   . CAD (coronary artery disease)     a.  cath 5/12: mLAD 50% then tandem 50%, dLAD 30%; D1 30%; OM1 occluded with collateral flow; pRCA 25%; EF 55% with inf HK;  occluded OM likely cause of perfusion defect on Myoview  . Tachy-brady syndrome     s/p pacer  . Ulcerative colitis   . Dizziness   . Nonspecific abnormal unspecified cardiovascular function study   . GERD (gastroesophageal reflux disease)    Past Surgical History  Procedure Laterality Date  . Implantation of a medtronic dual chamber pacemaker  02/01/06    [name] [name] MD  . Left knee arthroscopy with debridement  04/23/2006    [name] M.D  . Left knee total knee  replacement  05/13/07    [name] M.D.  . Knee surgery    . Back surgery    . Lumbar spine surgery    . Insert / replace / remove pacemaker      MEDTRONIC....PPM Serial Number:  [id number].....Marland KitchenPPM Model Number:  [id number]....Marland KitchenPPM DOI:  02/01/2006    . Cardiac catheterization  09/20/10    EF 55%.....Dr. Percival Spanish    Current Outpatient Prescriptions  Medication Sig Dispense Refill  . amiodarone (PACERONE) 200 MG tablet Take one-half tablet by mouth daily      . atenolol (TENORMIN) 100 MG tablet Take 0.5 tablets (50 mg total) by mouth every evening.  45 tablet  3  . Cholecalciferol (D-3-5) 5000 UNITS capsule Take 5,000  Units by mouth daily.      . folic acid (FOLVITE) 1 MG tablet Take 1 mg by mouth daily.      . isosorbide mononitrate (IMDUR) 60 MG 24 hr tablet Take 1 tablet (60 mg total) by mouth daily.  90 tablet  3  . levothyroxine (SYNTHROID, LEVOTHROID) 50 MCG tablet Take 50 mcg by mouth daily before breakfast.       . MELATONIN MAXIMUM STRENGTH PO Take 1 tablet by mouth at bedtime. This includes Theanine , GABA      . Mesalamine (ASACOL) 400 MG CPDR DR capsule Take 4 capsules (1,600 mg total) by mouth 2 (two) times daily.  720 capsule  3  . methotrexate (RHEUMATREX) 2.5 MG tablet Take 10 mg by mouth once a week.       . Multiple Vitamins-Minerals (CENTRUM PO) Take by mouth daily.      . Rivaroxaban (XARELTO) 20 MG TABS Take 20 mg by mouth daily.  90 tablet  2  . Umeclidinium-Vilanterol (ANORO ELLIPTA) 62.5-25 MCG/INH AEPB Inhale 1 puff into the lungs daily.       No current facility-administered medications for this visit.    Physical Exam: Filed Vitals:   01/05/14 1204  BP: 113/74  Pulse: 71  Height: 6' 1.5" (1.867 m)  Weight: 160 lb (72.576 kg)  GEN- The patient is well appearing, alert and oriented x 3 today.   Head- normocephalic, atraumatic Eyes-  Sclera clear, conjunctiva pink Ears- hearing intact Oropharynx- clear Lungs- Clear to ausculation bilaterally, normal work of breathing Chest- pacemaker pocket is well healed Heart- Regular rate and rhythm, no murmurs, rubs or gallops, PMI not laterally displaced GI- soft, NT, ND, + BS Extremities- no clubbing, cyanosis, or edema  Pacemaker interrogation- reviewed in detail today,  See PACEART report  Assessment and Plan:  1. Tachycardia/ Bradycardia Normal pacemaker function See Pace Art report No changes today  2. afib Well controlled with amiodarone I have informed of the importance of following LFTs/ TFTs They prefer to have Dr Quillian Quince do this   3. Fatigue Decrease atenolol to 63m daily Follow-up with Dr KBronson Ingnext  week as scheduled PCP to evaluate for noncardiac caues  4. CAD No ischemic symptoms Dr KCourt Joynote regarding prior mBrantley Flingis reviewed Will need to see how he does with lower atenolol when he sees Dr KBronson Ingnext week  carelink Return to see me in 1 year

## 2014-01-08 ENCOUNTER — Encounter: Payer: Self-pay | Admitting: Cardiovascular Disease

## 2014-01-08 ENCOUNTER — Ambulatory Visit (INDEPENDENT_AMBULATORY_CARE_PROVIDER_SITE_OTHER): Payer: Medicare Other | Admitting: Cardiovascular Disease

## 2014-01-08 VITALS — BP 122/82 | HR 70 | Ht 73.0 in | Wt 161.0 lb

## 2014-01-08 DIAGNOSIS — I4891 Unspecified atrial fibrillation: Secondary | ICD-10-CM | POA: Diagnosis not present

## 2014-01-08 DIAGNOSIS — I2510 Atherosclerotic heart disease of native coronary artery without angina pectoris: Secondary | ICD-10-CM | POA: Diagnosis not present

## 2014-01-08 DIAGNOSIS — I10 Essential (primary) hypertension: Secondary | ICD-10-CM

## 2014-01-08 DIAGNOSIS — E785 Hyperlipidemia, unspecified: Secondary | ICD-10-CM

## 2014-01-08 DIAGNOSIS — R5383 Other fatigue: Secondary | ICD-10-CM

## 2014-01-08 DIAGNOSIS — Z79899 Other long term (current) drug therapy: Secondary | ICD-10-CM

## 2014-01-08 DIAGNOSIS — Z950 Presence of cardiac pacemaker: Secondary | ICD-10-CM | POA: Diagnosis not present

## 2014-01-08 DIAGNOSIS — R5381 Other malaise: Secondary | ICD-10-CM

## 2014-01-08 DIAGNOSIS — I480 Paroxysmal atrial fibrillation: Secondary | ICD-10-CM

## 2014-01-08 DIAGNOSIS — Z7189 Other specified counseling: Secondary | ICD-10-CM

## 2014-01-08 DIAGNOSIS — I495 Sick sinus syndrome: Secondary | ICD-10-CM

## 2014-01-08 DIAGNOSIS — J449 Chronic obstructive pulmonary disease, unspecified: Secondary | ICD-10-CM

## 2014-01-08 DIAGNOSIS — E038 Other specified hypothyroidism: Secondary | ICD-10-CM

## 2014-01-08 DIAGNOSIS — T50905A Adverse effect of unspecified drugs, medicaments and biological substances, initial encounter: Secondary | ICD-10-CM

## 2014-01-08 MED ORDER — ISOSORBIDE MONONITRATE ER 30 MG PO TB24: 30.0000 mg | ORAL_TABLET | Freq: Every day | ORAL | Status: DC

## 2014-01-08 NOTE — Patient Instructions (Signed)
Your physician recommends that you schedule a follow-up appointment in: 3 months   Your physician has recommended you make the following change in your medication:     STOP Amiodarone   DECREASE Imdur to 30 mg at bed time      Thank you for choosing [facility] !

## 2014-01-08 NOTE — Progress Notes (Signed)
Patient ID: [name], male   DOB: [birth year], 79 y.o.   MRN: [id number]      SUBJECTIVE: The patient presents for routine cardiovascular followup. His primary symptoms over the past several months have been related to fatigue. His atenolol dose was reduced by Dr. Rayann Frank 3 days ago.  In summary, he is a 79 year old male with a history of atrial fibrillation, hypertension, hyperlipidemia, coronary artery disease, and has a pacemaker for tachycardia-bradycardia syndrome. His most recent cardiac catheterization was performed in May 2012 by [name] which demonstrated tandem 50% lesions in the mid LAD. The first obtuse marginal branch was occluded but received collateral flow, and was responsible for a prior defect seen on nuclear stress testing.  He has been feeling fatigued for the past year. He does have COPD. He is on amiodarone and has hypothyroidism and is on levothyroxine.   Lexiscan Cardiolite stress test demonstrated a fixed inferolateral defect which was consistent with myocardial scar versus attenuation artifact.  PFTs demonstrated moderate airflow obstruction with moderate to severely reduced DLCO. He sees a pulmonologist in [place], Dr. Audie Frank.  He, his wife, and his daughter [name]) who recently moved to [facility] from [place] are present. [name] has several questions with regards to her father's medications.  She has noticed a steady decline in her father's health status over the past 2 years. He has become more fatigued, progressively short of breath, with weakness and dizziness which on one occasion led to a fall where he bumped his head and fell on the floor in [employer]. The patient denies chest pain. He has nightmares almost every evening and awakes at 3 AM. His daughter feels that this is due to amiodarone and would like this discontinued. She also believes the Imdur has led to some of his nightmares and wonders if he can take it at a reduced dose and in the evening. She also  has questions regarding Xarelto. The patient reports feeling slightly less fatigued in the past 2 days.    Review of Systems: As per "subjective", otherwise negative.  Allergies  Allergen Reactions  . Nsaids     colitis    Current Outpatient Prescriptions  Medication Sig Dispense Refill  . amiodarone (PACERONE) 200 MG tablet Take one-half tablet by mouth daily      . atenolol (TENORMIN) 100 MG tablet Take 0.5 tablets (50 mg total) by mouth every evening.  45 tablet  3  . Cholecalciferol (D-3-5) 5000 UNITS capsule Take 5,000 Units by mouth daily.      . folic acid (FOLVITE) 1 MG tablet Take 1 mg by mouth daily.      . isosorbide mononitrate (IMDUR) 60 MG 24 hr tablet Take 1 tablet (60 mg total) by mouth daily.  90 tablet  3  . levothyroxine (SYNTHROID, LEVOTHROID) 50 MCG tablet Take 50 mcg by mouth daily before breakfast.       . MELATONIN MAXIMUM STRENGTH PO Take 1 tablet by mouth as needed. This includes Theanine , GABA      . Multiple Vitamins-Minerals (CENTRUM PO) Take by mouth daily.      . Rivaroxaban (XARELTO) 20 MG TABS Take 20 mg by mouth daily.  90 tablet  2  . Umeclidinium-Vilanterol (ANORO ELLIPTA) 62.5-25 MCG/INH AEPB Inhale 1 puff into the lungs daily.       No current facility-administered medications for this visit.    Past Medical History  Diagnosis Date  . Hypertension   . Dyslipidemia   . Atrial fibrillation   .  CAD (coronary artery disease)     a.  cath 5/12: mLAD 50% then tandem 50%, dLAD 30%; D1 30%; OM1 occluded with collateral flow; pRCA 25%; EF 55% with inf HK;  occluded OM likely cause of perfusion defect on Myoview  . Tachy-brady syndrome     s/p pacer  . Ulcerative colitis   . Dizziness   . Nonspecific abnormal unspecified cardiovascular function study   . GERD (gastroesophageal reflux disease)     Past Surgical History  Procedure Laterality Date  . Implantation of a medtronic dual chamber pacemaker  02/01/06    William Mungo. [name] MD  . Left knee  arthroscopy with debridement  04/23/2006    [name]  . Left knee total knee  replacement  05/13/07    [name].  . Knee surgery    . Back surgery    . Lumbar spine surgery    . Insert / replace / remove pacemaker      MEDTRONIC....PPM Serial Number:  [id number].....Marland KitchenPPM Model Number:  [id number]....Marland KitchenPPM DOI:  02/01/2006    . Cardiac catheterization  09/20/10    EF 55%.....[name]    History   Social History  . Marital Status: Married    Spouse Name: N/A    Number of Children: 3  . Years of Education: N/A   Occupational History  . retired    Social History Main Topics  . Smoking status: Former Smoker -- 1.00 packs/day for 15 years    Types: Cigarettes    Start date: 05/13/1955    Quit date: 05/11/1978  . Smokeless tobacco: Never Used  . Alcohol Use: No  . Drug Use: No  . Sexual Activity: Not on file   Other Topics Concern  . Not on file   Social History Narrative  . No narrative on file     Filed Vitals:   01/08/14 0935  BP: 122/82  Pulse: 70  Height: 6' 1"  (1.854 m)  Weight: 161 lb (73.029 kg)  SpO2: 99%    PHYSICAL EXAM General: NAD HEENT: Normal. Neck: No JVD, no thyromegaly. Lungs: Clear to auscultation bilaterally with normal respiratory effort. CV: Nondisplaced PMI.  Regular rate and rhythm, normal S1/S2, no S3/S4, no murmur. No pretibial or periankle edema.  No carotid bruit.  Normal pedal pulses.  Abdomen: Soft, nontender, no hepatosplenomegaly, no distention.  Neurologic: Alert and oriented x 3.  Psych: Normal affect. Skin: Normal. Musculoskeletal: Normal range of motion, no gross deformities. Extremities: No clubbing or cyanosis.   ECG: Most recent ECG reviewed.      ASSESSMENT AND PLAN: 1. Increasing fatigue and dyspnea on exertion, in the setting of known CAD: Lexiscan Cardiolite stress test did not demonstrate any evidence of ischemia. I will continue current medical therapy with atenolol 50 mg daily and at  their request, will reduce Imdur to 30 mg q pm. His most recent LFTs were normal. I would consider starting statin therapy for its pleiotropic effects and explained this to the patient and his family. If his symptoms were to progress, I would not rule out the possibility of coronary angiography.   2. Atrial fibrillation: Device interrogation on 01/05/14 demonstrated normal device function and no high ventricular rates were noted. Continue atenolol 50 mg daily and Xarelto 20 mg daily. As per their request, I will d/c amiodarone altogether. I did inform them that there would be a higher chance for recurrence of atrial fibrillation. He has a history of hypothyroidism and is on  levothyroxine.   3. Essential HTN: Controlled on current therapy.   4. Pacemaker: Device interrogation on 01/05/14 demonstrated normal device function and  with no high ventricular rates noted. Saw Dr. Rayann Frank on 01/05/14. Amiodarone will be d/c as per their request.  5. Hyperlipidemia: I will try and obtain the results of his most recent lipid panel. Notably he is not on statin therapy.   6. COPD: On Symbicort. Followed in [place] by Dr. Audie Frank. PFT results previously reviewed.  Dispo: f/u 3 months.   Time spent: 40 minutes, of which >50% was spent counseling the patient and family members on risks and benefits of all cardiovascular medications as it relates to his disease processes.  Kate Sable, Frank., F.A.C.C.

## 2014-01-11 ENCOUNTER — Encounter: Payer: Self-pay | Admitting: Internal Medicine

## 2014-02-19 DIAGNOSIS — J449 Chronic obstructive pulmonary disease, unspecified: Secondary | ICD-10-CM | POA: Diagnosis not present

## 2014-02-19 DIAGNOSIS — K529 Noninfective gastroenteritis and colitis, unspecified: Secondary | ICD-10-CM | POA: Diagnosis not present

## 2014-02-19 DIAGNOSIS — E785 Hyperlipidemia, unspecified: Secondary | ICD-10-CM | POA: Diagnosis not present

## 2014-02-19 DIAGNOSIS — R5383 Other fatigue: Secondary | ICD-10-CM | POA: Diagnosis not present

## 2014-02-19 DIAGNOSIS — R209 Unspecified disturbances of skin sensation: Secondary | ICD-10-CM | POA: Diagnosis not present

## 2014-02-19 DIAGNOSIS — E039 Hypothyroidism, unspecified: Secondary | ICD-10-CM | POA: Diagnosis not present

## 2014-02-19 DIAGNOSIS — I2510 Atherosclerotic heart disease of native coronary artery without angina pectoris: Secondary | ICD-10-CM | POA: Diagnosis not present

## 2014-02-19 DIAGNOSIS — R413 Other amnesia: Secondary | ICD-10-CM | POA: Diagnosis not present

## 2014-02-21 ENCOUNTER — Telehealth: Payer: Self-pay | Admitting: Cardiovascular Disease

## 2014-02-21 NOTE — Telephone Encounter (Signed)
Patient's wife walked into clinic requesting to speak with a nurse about her husbands medication.   He would like to be started on APIXAGAN 5m 2x daily instead of (XARELTO) 20 MG TABS The DPalmettowill help cover cost.  Asked that you fax RX to [phone] Also thinks that the XAlveda Reasonsis causing him to itch

## 2014-02-23 NOTE — Telephone Encounter (Signed)
Would be ok to switch to Eliquis 5 mg bid.

## 2014-02-26 MED ORDER — APIXABAN 5 MG PO TABS: 5.0000 mg | ORAL_TABLET | Freq: Two times a day (BID) | ORAL | Status: DC

## 2014-02-26 NOTE — Telephone Encounter (Signed)
Patient notified.  Will give samples to get started.  Patient will be instructed to stop Xarelto & begin the Eliquis at the next scheduled dose.  Also scheduled for 1 month f/u with Lattie Haw for anticoagulation management.   New prescription faxed to number below [facility]) at patient request.

## 2014-03-06 DIAGNOSIS — Z6821 Body mass index (BMI) 21.0-21.9, adult: Secondary | ICD-10-CM | POA: Diagnosis not present

## 2014-03-06 DIAGNOSIS — R413 Other amnesia: Secondary | ICD-10-CM | POA: Diagnosis not present

## 2014-03-12 ENCOUNTER — Telehealth: Payer: Self-pay | Admitting: Cardiovascular Disease

## 2014-03-12 NOTE — Telephone Encounter (Signed)
Mr. Gasparini called stating that the [facility]) states that they need a letter from Dr. Bronson Ing stating why he needs to be on Eliquis. They are not wanting to pay for this medication until they receive a letter from our office.  Also, do we have samples that can be given?

## 2014-03-15 NOTE — Telephone Encounter (Signed)
Left message to return call 

## 2014-03-15 NOTE — Telephone Encounter (Signed)
Discussed below with wife [name]).  Will fax most recent documentation regarding the change to Eliquis.    Fax:     [phone]  Attn:  Team [place]   Wife also stated that he only has approximately 1 week of medication left.  Informed her that I do not have samples in office now, but have contacted rep to bring some.  Will call her to pick up when available.

## 2014-03-19 ENCOUNTER — Encounter: Payer: Self-pay | Admitting: Cardiovascular Disease

## 2014-03-19 ENCOUNTER — Ambulatory Visit (INDEPENDENT_AMBULATORY_CARE_PROVIDER_SITE_OTHER): Payer: Medicare Other | Admitting: Cardiovascular Disease

## 2014-03-19 VITALS — BP 144/75 | HR 67 | Ht 73.5 in | Wt 157.0 lb

## 2014-03-19 DIAGNOSIS — E785 Hyperlipidemia, unspecified: Secondary | ICD-10-CM

## 2014-03-19 DIAGNOSIS — R5383 Other fatigue: Secondary | ICD-10-CM | POA: Diagnosis not present

## 2014-03-19 DIAGNOSIS — T887XXA Unspecified adverse effect of drug or medicament, initial encounter: Secondary | ICD-10-CM

## 2014-03-19 DIAGNOSIS — I480 Paroxysmal atrial fibrillation: Secondary | ICD-10-CM | POA: Diagnosis not present

## 2014-03-19 DIAGNOSIS — I495 Sick sinus syndrome: Secondary | ICD-10-CM

## 2014-03-19 DIAGNOSIS — J449 Chronic obstructive pulmonary disease, unspecified: Secondary | ICD-10-CM

## 2014-03-19 DIAGNOSIS — Z950 Presence of cardiac pacemaker: Secondary | ICD-10-CM | POA: Diagnosis not present

## 2014-03-19 DIAGNOSIS — E038 Other specified hypothyroidism: Secondary | ICD-10-CM | POA: Diagnosis not present

## 2014-03-19 DIAGNOSIS — T50905A Adverse effect of unspecified drugs, medicaments and biological substances, initial encounter: Secondary | ICD-10-CM

## 2014-03-19 DIAGNOSIS — I2510 Atherosclerotic heart disease of native coronary artery without angina pectoris: Secondary | ICD-10-CM

## 2014-03-19 NOTE — Patient Instructions (Signed)
   Continue Eliquis 5m twice a day    Call with update in couple weeks.  If not feeling better, may cut dose in half. Continue all other medications.   Follow up in  3 monts

## 2014-03-19 NOTE — Progress Notes (Signed)
Patient ID: [name], male   DOB: [birth year], 79 y.o.   MRN: [id number]      SUBJECTIVE: The patient presents for routine cardiovascular followup. His primary symptoms over the past several months have been related to fatigue. His atenolol dose was reduced by Dr. Rayann Heman in August. In the interim he has been switched from Xarelto to Eliquis for anticoagulation for atrial fibrillation.  In summary, he is a 79 year old male with a history of atrial fibrillation, hypertension, hyperlipidemia, coronary artery disease, and has a pacemaker for tachycardia-bradycardia syndrome. His most recent cardiac catheterization was performed in May 2012 by Dr. Percival Spanish which demonstrated tandem 50% lesions in the mid LAD. The first obtuse marginal branch was occluded but received collateral flow, and was responsible for a prior defect seen on nuclear stress testing.  He had been feeling fatigued for the past year. He does have COPD. He had previously been on amiodarone and has hypothyroidism and is on levothyroxine.   Lexiscan Cardiolite stress test in 10/2013 demonstrated a fixed inferolateral defect which was consistent with myocardial scar versus attenuation artifact.  PFTs demonstrated moderate airflow obstruction with moderate to severely reduced DLCO. He sees a pulmonologist in [place], Dr. Audie Box.  At his most recent visit with me on 01/08/14, his daughter [name]) had noticed a steady decline in her father's health status over the past 2 years, and several medication adjustments were made at that time.  He actually felt better after the medication adjustments at his last visit. Approximately two weeks ago, he was started on both Aricept and Eliquis and has not felt well since. He occasionally has a pinprick sensation in his chest occurring about three times per day. He denies palpitations. He again feels markedly sluggish and fatigued, this week moreso than last week. Today is the first day he stopped  taking Aricept as he was instructed to do so by his prescribing physician. He, his wife, and their daughter, Dr. Augustina Mood (dentist in [place]) are present today.  Soc: Married. Daughter [name]) moved to [place] from [place]. Daughter (Dr. Augustina Mood, dentist) lives in [place]. Son [name] also lives in [place].    Review of Systems: As per "subjective", otherwise negative.  Allergies  Allergen Reactions  . Nsaids     colitis    Current Outpatient Prescriptions  Medication Sig Dispense Refill  . apixaban (ELIQUIS) 5 MG TABS tablet Take 1 tablet (5 mg total) by mouth 2 (two) times daily. 42 tablet 0  . atenolol (TENORMIN) 100 MG tablet Take 0.5 tablets (50 mg total) by mouth every evening. 45 tablet 3  . Cholecalciferol (D-3-5) 5000 UNITS capsule Take 5,000 Units by mouth daily.    . isosorbide mononitrate (IMDUR) 30 MG 24 hr tablet Take 1 tablet (30 mg total) by mouth at bedtime. 90 tablet 3  . levothyroxine (SYNTHROID, LEVOTHROID) 50 MCG tablet Take 50 mcg by mouth daily before breakfast.     . MELATONIN MAXIMUM STRENGTH PO Take 1 tablet by mouth as needed. This includes Theanine , GABA    . Multiple Vitamins-Minerals (CENTRUM PO) Take by mouth daily.     No current facility-administered medications for this visit.    Past Medical History  Diagnosis Date  . Hypertension   . Dyslipidemia   . Atrial fibrillation   . CAD (coronary artery disease)     a.  cath 5/12: mLAD 50% then tandem 50%, dLAD 30%; D1 30%; OM1 occluded with collateral flow; pRCA 25%; EF 55% with inf  HK;  occluded OM likely cause of perfusion defect on Myoview  . Tachy-brady syndrome     s/p pacer  . Ulcerative colitis   . Dizziness   . Nonspecific abnormal unspecified cardiovascular function study   . GERD (gastroesophageal reflux disease)     Past Surgical History  Procedure Laterality Date  . Implantation of a medtronic dual chamber pacemaker  02/01/06    [name] [name] MD  . Left knee arthroscopy with  debridement  04/23/2006    [name] M.D  . Left knee total knee  replacement  05/13/07    [name] M.D.  . Knee surgery    . Back surgery    . Lumbar spine surgery    . Insert / replace / remove pacemaker      MEDTRONIC....PPM Serial Number:  [id number].....Marland KitchenPPM Model Number:  [id number]....Marland KitchenPPM DOI:  02/01/2006    . Cardiac catheterization  09/20/10    EF 55%.....Dr. Percival Spanish    History   Social History  . Marital Status: Married    Spouse Name: N/A    Number of Children: 3  . Years of Education: N/A   Occupational History  . retired    Social History Main Topics  . Smoking status: Former Smoker -- 1.00 packs/day for 15 years    Types: Cigarettes    Start date: 05/13/1955    Quit date: 05/11/1978  . Smokeless tobacco: Never Used  . Alcohol Use: No  . Drug Use: No  . Sexual Activity: Not on file   Other Topics Concern  . Not on file   Social History Narrative     Filed Vitals:   03/19/14 1555  Height: 6' 1.5" (1.867 m)  Weight: 157 lb (71.215 kg)    BP 144/75  Pulse 67 SpO2 98%    PHYSICAL EXAM General: NAD HEENT: Normal. Neck: No JVD, no thyromegaly. Lungs: Clear to auscultation bilaterally with normal respiratory effort. CV: Nondisplaced PMI.  Regular rate and rhythm, normal S1/S2, no S3/S4, no murmur. No pretibial or periankle edema.  No carotid bruit.  Normal pedal pulses.  Abdomen: Soft, nontender, no hepatosplenomegaly, no distention.  Neurologic: Alert and oriented x 3.  Psych: Normal affect. Skin: Normal. Musculoskeletal: Normal range of motion, no gross deformities. Extremities: No clubbing or cyanosis.   ECG: Most recent ECG reviewed.    ASSESSMENT AND PLAN: 1. CAD: Stable ischemic heart disease. Lexiscan Cardiolite stress test did not demonstrate any evidence of ischemia. I will continue current medical therapy with atenolol 50 mg daily and Imdur 30 mg q pm. His most recent LFTs were normal. I would consider starting statin  therapy for its pleiotropic effects and previously explained this to the patient and his family. If his symptoms were to progress, I would not rule out the possibility of coronary angiography.  2. Atrial fibrillation: Device interrogation on 01/05/14 demonstrated normal device function and no high ventricular rates were noted. Continue atenolol 50 mg daily and Eliquis 5 mg twice daily for now.  3. Essential HTN: Controlled on current therapy.  4. Pacemaker: Device interrogation on 01/05/14 demonstrated normal device function and with no high ventricular rates noted. Saw Dr. Rayann Heman on 01/05/14.  5. Hyperlipidemia: I will try and obtain the results of his most recent lipid panel. Notably he is not on statin therapy.  6. COPD: On Symbicort. Followed in [place] by Dr. Audie Box. PFT results previously reviewed. 7. Fatigue: It is difficult to ascertain at the present time whether this is  due to Aricept or Eliquis. I suspect it is more likely related to Aricept, which he only just stopped taking. I recommend he continue taking Eliquis for the next two weeks. If his symptoms improve at the end of that period, I would continue him on Eliquis 5 mg twice daily. If his symptoms do not improve or worsen, I would reduce Eliquis to 2.5 mg twice daily. If this does not improve his symptoms, I will consider switching to a different anticoagulant altogether. Of note, Xarelto lead to itching in the past.  Dispo: f/u 3 months.    Kate Sable, M.D., F.A.C.C.

## 2014-03-19 NOTE — Telephone Encounter (Signed)
Office visit has been scheduled for this afternoon, not feeling well.  Can give samples at that time.

## 2014-03-20 MED ORDER — APIXABAN 5 MG PO TABS: 5.0000 mg | ORAL_TABLET | Freq: Two times a day (BID) | ORAL | Status: DC

## 2014-03-20 NOTE — Addendum Note (Signed)
Addended by: Laurine Blazer on: 03/20/2014 09:40 AM   Modules accepted: Orders

## 2014-03-23 DIAGNOSIS — J449 Chronic obstructive pulmonary disease, unspecified: Secondary | ICD-10-CM | POA: Diagnosis not present

## 2014-03-23 DIAGNOSIS — R413 Other amnesia: Secondary | ICD-10-CM | POA: Diagnosis not present

## 2014-03-23 DIAGNOSIS — R209 Unspecified disturbances of skin sensation: Secondary | ICD-10-CM | POA: Diagnosis not present

## 2014-03-23 DIAGNOSIS — R5383 Other fatigue: Secondary | ICD-10-CM | POA: Diagnosis not present

## 2014-03-23 DIAGNOSIS — Z6821 Body mass index (BMI) 21.0-21.9, adult: Secondary | ICD-10-CM | POA: Diagnosis not present

## 2014-03-28 ENCOUNTER — Telehealth (INDEPENDENT_AMBULATORY_CARE_PROVIDER_SITE_OTHER): Payer: Self-pay | Admitting: *Deleted

## 2014-03-28 NOTE — Telephone Encounter (Signed)
[name] would like to see if Dr. Laural Golden would send in a Rx for Delzical to [facility] in [place] for a one month supply. They have been getting it from the mail order pharmacy for a 90 day supply. They can not afford it at this time and would like to try a 1 month supply/30 days until after the first of the year. Her return phone number is [phone].

## 2014-03-29 NOTE — Telephone Encounter (Signed)
Patient's wife was made aware.

## 2014-03-29 NOTE — Telephone Encounter (Signed)
Delizcol l 1.6 grams take 1 by mouth twice a day #60 no refills called to the [facility]/[place]. Attempts have been made to reach the patient. No answer.

## 2014-04-03 ENCOUNTER — Ambulatory Visit (INDEPENDENT_AMBULATORY_CARE_PROVIDER_SITE_OTHER): Payer: Medicare Other | Admitting: *Deleted

## 2014-04-03 ENCOUNTER — Other Ambulatory Visit: Payer: Self-pay | Admitting: Cardiovascular Disease

## 2014-04-03 DIAGNOSIS — I4891 Unspecified atrial fibrillation: Secondary | ICD-10-CM

## 2014-04-03 LAB — CBC
HEMATOCRIT: 39.9 % (ref 39.0–52.0)
Hemoglobin: 13.2 g/dL (ref 13.0–17.0)
MCH: 29.2 pg (ref 26.0–34.0)
MCHC: 33.1 g/dL (ref 30.0–36.0)
MCV: 88.3 fL (ref 78.0–100.0)
MPV: 10.4 fL (ref 9.4–12.4)
Platelets: 175 10*3/uL (ref 150–400)
RBC: 4.52 MIL/uL (ref 4.22–5.81)
RDW: 14.8 % (ref 11.5–15.5)
WBC: 8 10*3/uL (ref 4.0–10.5)

## 2014-04-04 LAB — BASIC METABOLIC PANEL
BUN: 20 mg/dL (ref 6–23)
CHLORIDE: 106 meq/L (ref 96–112)
CO2: 26 mEq/L (ref 19–32)
Calcium: 9.6 mg/dL (ref 8.4–10.5)
Creat: 1.21 mg/dL (ref 0.50–1.35)
GLUCOSE: 80 mg/dL (ref 70–99)
POTASSIUM: 5 meq/L (ref 3.5–5.3)
Sodium: 140 mEq/L (ref 135–145)

## 2014-04-09 ENCOUNTER — Encounter: Payer: Self-pay | Admitting: Internal Medicine

## 2014-04-09 ENCOUNTER — Ambulatory Visit (INDEPENDENT_AMBULATORY_CARE_PROVIDER_SITE_OTHER): Payer: Medicare Other | Admitting: *Deleted

## 2014-04-09 DIAGNOSIS — I495 Sick sinus syndrome: Secondary | ICD-10-CM

## 2014-04-09 DIAGNOSIS — J449 Chronic obstructive pulmonary disease, unspecified: Secondary | ICD-10-CM | POA: Diagnosis not present

## 2014-04-10 ENCOUNTER — Ambulatory Visit: Payer: Medicare Other | Admitting: Cardiovascular Disease

## 2014-04-10 NOTE — Progress Notes (Signed)
Remote pacemaker transmission.   

## 2014-04-11 ENCOUNTER — Other Ambulatory Visit: Payer: Self-pay | Admitting: *Deleted

## 2014-04-11 ENCOUNTER — Telehealth: Payer: Self-pay | Admitting: *Deleted

## 2014-04-11 LAB — MDC_IDC_ENUM_SESS_TYPE_REMOTE
Battery Impedance: 2260 Ohm
Battery Voltage: 2.71 V
Brady Statistic AP VP Percent: 1 %
Brady Statistic AP VS Percent: 98 %
Brady Statistic AS VP Percent: 0 %
Brady Statistic AS VS Percent: 1 %
Date Time Interrogation Session: 20151130142231
Lead Channel Impedance Value: 446 Ohm
Lead Channel Impedance Value: 552 Ohm
Lead Channel Pacing Threshold Amplitude: 0.625 V
Lead Channel Pacing Threshold Amplitude: 0.75 V
Lead Channel Pacing Threshold Pulse Width: 0.4 ms
Lead Channel Sensing Intrinsic Amplitude: 8 mV
Lead Channel Setting Pacing Amplitude: 2 V
Lead Channel Setting Pacing Amplitude: 2.5 V
Lead Channel Setting Sensing Sensitivity: 4 mV
MDC IDC MSMT BATTERY REMAINING LONGEVITY: 22 mo
MDC IDC MSMT LEADCHNL RV PACING THRESHOLD PULSEWIDTH: 0.4 ms
MDC IDC SET LEADCHNL RV PACING PULSEWIDTH: 0.4 ms

## 2014-04-11 MED ORDER — APIXABAN 5 MG PO TABS: 5.0000 mg | ORAL_TABLET | Freq: Two times a day (BID) | ORAL | Status: DC

## 2014-04-11 NOTE — Telephone Encounter (Signed)
-----   Message from Herminio Commons, MD sent at 04/10/2014  1:23 PM EST ----- Good results.

## 2014-04-11 NOTE — Telephone Encounter (Signed)
Patient's wife informed

## 2014-04-13 ENCOUNTER — Telehealth: Payer: Self-pay | Admitting: Cardiovascular Disease

## 2014-04-13 DIAGNOSIS — I10 Essential (primary) hypertension: Secondary | ICD-10-CM

## 2014-04-13 DIAGNOSIS — I4891 Unspecified atrial fibrillation: Secondary | ICD-10-CM

## 2014-04-13 NOTE — Telephone Encounter (Signed)
apixaban (ELIQUIS) 5 MG TABS tablet  Needs to be called into the [facility].patient to start 12/8

## 2014-04-16 MED ORDER — APIXABAN 5 MG PO TABS
5.0000 mg | ORAL_TABLET | Freq: Two times a day (BID) | ORAL | Status: DC
Start: 2014-04-16 — End: 2014-04-25

## 2014-04-16 NOTE — Telephone Encounter (Signed)
Discussed with wife [name]).  States that he is very sensitive to medicine.  Fatigue is really no different.  Has remained off the Aricept, but itching has continued.  Will give samples in the meantime till get answer from MD.  Wife verbalized understanding.

## 2014-04-16 NOTE — Telephone Encounter (Signed)
Please call patients wife.  Patient is to start medication tomorrow and she is worried since [facility] has not gotten the prescription yet

## 2014-04-17 ENCOUNTER — Encounter: Payer: Self-pay | Admitting: Cardiology

## 2014-04-17 NOTE — Telephone Encounter (Addendum)
Wife [name]) came by office.  Notified of below.  Patient just had labs done on 04/03/14 (see EPIC).  Does he really need to repeat another BMET prior to starting new medication?

## 2014-04-17 NOTE — Telephone Encounter (Signed)
Have BMET checked. If GFR < 50, start Savaysa 30 mg once daily. Otherwise, 60 mg once daily.

## 2014-04-18 NOTE — Telephone Encounter (Signed)
No repeat BMET necessary. Thanks.

## 2014-04-24 DIAGNOSIS — L570 Actinic keratosis: Secondary | ICD-10-CM | POA: Diagnosis not present

## 2014-04-24 DIAGNOSIS — Z85828 Personal history of other malignant neoplasm of skin: Secondary | ICD-10-CM | POA: Diagnosis not present

## 2014-04-25 MED ORDER — EDOXABAN TOSYLATE 60 MG PO TABS: 60.0000 mg | ORAL_TABLET | Freq: Every day | ORAL | Status: DC

## 2014-04-25 NOTE — Telephone Encounter (Signed)
Wife [name]) notified.  She will come by office tomorrow to pick up samples.  He will begin Savaysa 19m daily.  Will begin when next dose is due after stopping the Eliquis.  Can be taken with or without food.  Patient will start with the samples to see if he tolerates & call back when ready to send new rx to pharm.  States that he will have a new insurance plan after the first of the year & is in doughnut whole with current one.  We will supply samples till then.

## 2014-05-01 NOTE — Patient Instructions (Signed)
Pt was started on Eliquis for atrial fib on 02/21/14.    Reviewed patients medication list.  Pt is not currently on any combined P-gp and strong CYP3A4 inhibitors/inducers (ketoconazole, traconazole, ritonavir, carbamazepine, phenytoin, rifampin, St. John's wort).  Reviewed labs.  SCr 1.21, Weight 71kg, CrCl 51.34.  Dose is appropriate based on CrCl.   Hgb and HCT: 13.2/39.9  A full discussion of the nature of anticoagulants has been carried out.  A benefit/risk analysis has been presented to the patient, so that they understand the justification for choosing anticoagulation with Eliquis at this time.  The need for compliance is stressed.  Pt is aware to take the medication twice daily.  Side effects of potential bleeding are discussed, including unusual colored urine or stools, coughing up blood or coffee ground emesis, nose bleeds or serious fall or head trauma.  Discussed signs and symptoms of stroke. The patient should avoid any OTC items containing aspirin or ibuprofen.  Avoid alcohol consumption.   Call if any signs of abnormal bleeding.  Discussed financial obligations and resolved any difficulty in obtaining medication.  Next lab test test in 6 months.

## 2014-05-08 DIAGNOSIS — H1131 Conjunctival hemorrhage, right eye: Secondary | ICD-10-CM | POA: Diagnosis not present

## 2014-05-21 ENCOUNTER — Telehealth: Payer: Self-pay | Admitting: *Deleted

## 2014-05-21 MED ORDER — EDOXABAN TOSYLATE 60 MG PO TABS: 60.0000 mg | ORAL_TABLET | Freq: Every day | ORAL | Status: DC

## 2014-05-21 MED ORDER — METOPROLOL SUCCINATE ER 50 MG PO TB24: 50.0000 mg | ORAL_TABLET | Freq: Every day | ORAL | Status: DC

## 2014-05-21 NOTE — Telephone Encounter (Signed)
That would be fine but he would need something for rate control. Please ask if he prefers Toprol-XL 50 mg daily or diltiazem 60 mg bid.

## 2014-05-21 NOTE — Telephone Encounter (Signed)
Wife informed and request either of the two. Toprol xl will be sent and wife informed that he should take it at the same time daily as he did atenolol. Wife also request refill on savaysa be sent as well.

## 2014-05-21 NOTE — Telephone Encounter (Signed)
Per wife, patient was switched to savaysa because of an itching problem. Patient is continuing to itch since being on the savaysa. Patient's wife is requesting that atenolol be held to see if it could be causing the itching. Wife advised that message would be routed to Dr. Bronson Ing for advise.

## 2014-05-23 ENCOUNTER — Telehealth (INDEPENDENT_AMBULATORY_CARE_PROVIDER_SITE_OTHER): Payer: Self-pay | Admitting: *Deleted

## 2014-05-23 NOTE — Telephone Encounter (Signed)
[name] would like to see if Dr. Laural Golden would send in a Rx for Delzical to [facility] in [place] for a one month supply. They have been getting it from the mail order pharmacy for a 90 day supply. They can not afford it at this time.  Her return phone number is [phone].

## 2014-05-24 NOTE — Telephone Encounter (Signed)
Patient was called and a message was left that we had samples of the Delzicol. They may come and pick up.

## 2014-05-24 NOTE — Telephone Encounter (Signed)
This is for Dr. Laural Golden

## 2014-05-29 ENCOUNTER — Ambulatory Visit: Payer: PRIVATE HEALTH INSURANCE | Admitting: Neurology

## 2014-05-30 ENCOUNTER — Telehealth: Payer: Self-pay | Admitting: Cardiovascular Disease

## 2014-05-30 ENCOUNTER — Ambulatory Visit (INDEPENDENT_AMBULATORY_CARE_PROVIDER_SITE_OTHER): Payer: Medicare Other | Admitting: *Deleted

## 2014-05-30 VITALS — BP 120/82

## 2014-05-30 DIAGNOSIS — R000 Tachycardia, unspecified: Secondary | ICD-10-CM | POA: Diagnosis not present

## 2014-05-30 DIAGNOSIS — I4891 Unspecified atrial fibrillation: Secondary | ICD-10-CM | POA: Diagnosis not present

## 2014-05-30 MED ORDER — METOPROLOL TARTRATE 50 MG PO TABS: 50.0000 mg | ORAL_TABLET | Freq: Two times a day (BID) | ORAL | Status: DC

## 2014-05-30 NOTE — Telephone Encounter (Signed)
If atrial fibrillation, cannot be certain about accuracy of HR. Would have an ECG performed and repeat BP in office. Can then decide how to proceed with medical management.

## 2014-05-30 NOTE — Telephone Encounter (Signed)
Mrs. Alms calls today stating that her husbands BP medication was changed. He started on it last Friday. States that his heart is out of rhythm today.  BP readings today are 160/91 8:30am,  142/82 8:45am  122/90 10:00am  Patient is very concerned about this.

## 2014-05-30 NOTE — Telephone Encounter (Signed)
Pt wife will bring pt in now. Will forward to Dr. Bronson Ing as Juluis Rainier and add to nurse schedule

## 2014-05-30 NOTE — Patient Instructions (Signed)
Your physician has recommended you make the following change in your medication:   INCREASE METOPROLOL TO 50 MG TWICE DAILY  Thank you for choosing [facility]!!

## 2014-05-30 NOTE — Progress Notes (Signed)
Pt wife called to report pt in possible a-fib. HR read 109. Ok'd by Dr. Bronson Ing for EKG and BP nurse visit. Dr. Bronson Ing increased metoprolol 50 mg twice daily after review of EKG and BP. Pt verbalized understanding and will update [place] with any changes. Medication sent to pharmacy.

## 2014-05-30 NOTE — Telephone Encounter (Signed)
BP 118/90 HR 112 reported from pt wife, states pt feels "like he has been hit by a truck" this morning. Pt started metoprolol 50 mg daily. Will forward to Dr. Bronson Ing

## 2014-06-05 ENCOUNTER — Encounter: Payer: Self-pay | Admitting: Internal Medicine

## 2014-06-06 ENCOUNTER — Encounter: Payer: Self-pay | Admitting: Neurology

## 2014-06-06 ENCOUNTER — Telehealth (INDEPENDENT_AMBULATORY_CARE_PROVIDER_SITE_OTHER): Payer: Self-pay | Admitting: *Deleted

## 2014-06-06 NOTE — Telephone Encounter (Signed)
[name] said it has been a while since the medicine has been filled last.

## 2014-06-06 NOTE — Telephone Encounter (Signed)
This is being forwarded to Dr.[name] to authorize.

## 2014-06-06 NOTE — Telephone Encounter (Signed)
Ann said [name] has started the itching again. Would like to see if Dr. Laural Golden would please call in another refill on the Methotrexate to [facility] in [place]. Tried calling Ann back twice, to have her call the pharmacy and was not able to get her. The return phone number is [phone].

## 2014-06-08 ENCOUNTER — Ambulatory Visit (INDEPENDENT_AMBULATORY_CARE_PROVIDER_SITE_OTHER): Payer: Medicare Other | Admitting: Neurology

## 2014-06-08 ENCOUNTER — Encounter: Payer: Self-pay | Admitting: Neurology

## 2014-06-08 VITALS — BP 107/70 | HR 64 | Temp 97.8°F | Ht 72.0 in | Wt 162.0 lb

## 2014-06-08 DIAGNOSIS — R269 Unspecified abnormalities of gait and mobility: Secondary | ICD-10-CM | POA: Diagnosis not present

## 2014-06-08 DIAGNOSIS — R29898 Other symptoms and signs involving the musculoskeletal system: Secondary | ICD-10-CM | POA: Diagnosis not present

## 2014-06-08 DIAGNOSIS — R202 Paresthesia of skin: Secondary | ICD-10-CM

## 2014-06-08 DIAGNOSIS — G3184 Mild cognitive impairment, so stated: Secondary | ICD-10-CM

## 2014-06-08 DIAGNOSIS — I480 Paroxysmal atrial fibrillation: Secondary | ICD-10-CM

## 2014-06-08 DIAGNOSIS — R209 Unspecified disturbances of skin sensation: Secondary | ICD-10-CM | POA: Diagnosis not present

## 2014-06-08 DIAGNOSIS — I4891 Unspecified atrial fibrillation: Secondary | ICD-10-CM | POA: Diagnosis not present

## 2014-06-08 NOTE — Progress Notes (Addendum)
Subjective:    Patient ID: [name] is a 80 y.o. male.  HPI     Star Age, MD, PhD [facility] [address] [place], [place] [address]  Dear Dr. Dagmar Hait,   I saw your patient, [name], upon your kind request in my neurologic clinic today for initial consultation of his paresthesias as well as his memory loss. The patient is accompanied by his wife, and his daughter, Katharine Look, today. As you know, [name] is a 80 year old right-handed gentleman with a complex underlying medical history of paroxysmal A. fib , coronary artery disease (followed by Dr. Bronson Ing), status post pacemaker placement in 2007 secondary to tachycardia-bradycardia syndrome, COPD, arthritis, status post left total knee replacement in 2008, degenerative back disease, status post lumbar spine surgery in the 70s, hypothyroidism, hearing loss, anxiety, depression, gout, hypertension, hyperlipidemia, ulcerative colitis (followed by Dr. Laural Golden), who reports short-term memory loss for the past 1+ year. He has problems with short-term memory particularly with forgetfulness but there is no report of confusion, delusions, or hallucinations. He was recently placed on Aricept but this was discontinued because of side effects in particular severe fatigue. He also had mood related side effects such as depressive symptoms. He has actually tried several different antidepressants which also caused side effects. He has a tendency to be quite sensitive to medications. In particular, he has had quite a bit of change in his blood pressure and heart medications. He quit smoking in the 80s but has a 30-pack-year history of smoking.  He had a CT head without contrast on 07/22/2012 after a fall: No acute intracranial abnormality.  Extensive chronic small vessel ischemic changes in the periventricular white matter.  Slight atrophy. In addition, personally reviewed the images through the PACS  system.  He had to home sleep studies which did not show any significant obstructive apnea but he did have some desaturations. He also had a overnight pulse oximetry test from what I understand in late November last year but the results are not available for review today. According to his daughter, there were desaturations during sleep but not necessarily related to obstructive breathing events. Of note, he does have a history of asbestos exposure during his work time and sees a pulmonologist in [place], [place] once a year for that. 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   He reports paresthesias in both upper extremities, also some pain. This is restricted to his fingers and toes. He feels that his toes are numb. He has some grip weakness in both hands. He also has a trigger finger on the left side in the middle finger. He had an injury to his left ring finger tip years ago, probably in the 2s. He has had chronic itching for decades. When he was on methotrexate for his ulcerative colitis is itching improved. Nevertheless, he is currently not on methotrexate and was on it on 2 different occasions.   He also sees a dermatologist.   In your office note from 03/28/2014 you mention his B12 and TSH, which have been normal, electrolytes were normal as well.  His Past Medical History Is Significant For: Past Medical History  Diagnosis Date  . Hypertension   . Dyslipidemia   .  Atrial fibrillation   . CAD (coronary artery disease)     a.  cath 5/12: mLAD 50% then tandem 50%, dLAD 30%; D1 30%; OM1 occluded with collateral flow; pRCA 25%; EF 55% with inf HK;  occluded OM likely cause of perfusion defect on Myoview  . Tachy-brady syndrome     s/p pacer  . Ulcerative colitis   .  Dizziness   . Nonspecific abnormal unspecified cardiovascular function study   . GERD (gastroesophageal reflux disease)   . Arthritis   . Arrhythmia   . Pelvic fracture   . Hyperlipidemia   . Depression   . Anxiety   . Gout   . Dementia     short term  . Skin disorder     blister formation  . Hearing disorder   . Hypothyroidism   . COPD (chronic obstructive pulmonary disease)     His Past Surgical History Is Significant For: Past Surgical History  Procedure Laterality Date  . Implantation of a medtronic dual chamber pacemaker  02/01/06    [name] [name] MD  . Left knee arthroscopy with debridement  04/23/2006    [name] M.D  . Left knee total knee  replacement  05/13/07    [name] M.D.  . Knee surgery    . Back surgery    . Lumbar spine surgery    . Insert / replace / remove pacemaker      MEDTRONIC....PPM Serial Number:  [id number].....Marland KitchenPPM Model Number:  [id number]....Marland KitchenPPM DOI:  02/01/2006    . Cardiac catheterization  09/20/10    EF 55%.....Dr. Percival Spanish    His Family History Is Significant For: Family History  Problem Relation Age of Onset  . Lung disease Father     Emphysema  . Cancer Mother   . Hypertension Mother   . Liver cancer Mother   . Pernicious anemia Mother   . Asthma Sister   . Healthy Daughter   . Healthy Daughter   . Healthy Son   . Hypertension Father   . Peptic Ulcer Disease Father   . COPD Father   . Depression Mother   . Kidney disease Mother   . Colitis Mother   . COPD Sister   . Glaucoma Sister   . Hypertension Sister   . Cancer Sister   . Emphysema Father     His Social History Is Significant For: History   Social History  . Marital Status: Married    Spouse Name: [name]    Number of Children: 3  . Years of Education: 12   Occupational History  . retired    Social History Main Topics  . Smoking status: Former Smoker -- 1.00 packs/day for 15 years    Types: Cigarettes    Start date: 05/13/1955     Quit date: 05/11/1978  . Smokeless tobacco: Never Used  . Alcohol Use: No  . Drug Use: No  . Sexual Activity: None   Other Topics Concern  . None   Social History Narrative   One cup of caffeine daily    His Allergies Are:  Allergies  Allergen Reactions  . Nsaids     colitis  :   His Current Medications Are:  Outpatient Encounter Prescriptions as of 06/08/2014  Medication Sig  . edoxaban (SAVAYSA) 60 MG TABS tablet Take 60 mg by mouth daily.  . isosorbide mononitrate (IMDUR) 30 MG 24 hr tablet Take 1 tablet (30 mg total) by mouth at bedtime.  Marland Kitchen levothyroxine (SYNTHROID,  LEVOTHROID) 50 MCG tablet Take 50 mcg by mouth daily before breakfast.   . Mesalamine (ASACOL) 400 MG CPDR DR capsule Take by mouth 2 (two) times daily. 3 capsules two times daily  . metoprolol (LOPRESSOR) 50 MG tablet Take 1 tablet (50 mg total) by mouth 2 (two) times daily.  . Multiple Vitamins-Minerals (CENTRUM PO) Take by mouth daily.  . [DISCONTINUED] apixaban (ELIQUIS) 5 MG TABS tablet Take 5 mg by mouth 2 (two) times daily.  . [DISCONTINUED] budesonide-formoterol (SYMBICORT) 160-4.5 MCG/ACT inhaler Inhale 2 puffs into the lungs 2 (two) times daily.  . [DISCONTINUED] cetirizine (ZYRTEC) 10 MG tablet Take 10 mg by mouth daily.  . [DISCONTINUED] Cholecalciferol (D-3-5) 5000 UNITS capsule Take 5,000 Units by mouth daily.  . [DISCONTINUED] donepezil (ARICEPT) 5 MG tablet   . [DISCONTINUED] isosorbide mononitrate (IMDUR) 60 MG 24 hr tablet   . [DISCONTINUED] MELATONIN MAXIMUM STRENGTH PO Take 1 tablet by mouth as needed. This includes Theanine , GABA  . [DISCONTINUED] mometasone (ELOCON) 0.1 % ointment   :  Review of Systems:  Out of a complete 14 point review of systems, all are reviewed and negative with the exception of these symptoms as listed below:    Review of Systems  Constitutional:       Weight loss  HENT: Positive for hearing loss.   Respiratory: Positive for shortness of breath.   Endocrine:  Positive for cold intolerance.  Musculoskeletal:       Joint pain, aching muscles  Skin: Positive for rash.       itching  Allergic/Immunologic:       Runny nose,skin sensitivity  Neurological: Positive for weakness and numbness.       Memory loss, insomnia  Hematological: Bruises/bleeds easily.  Psychiatric/Behavioral: Positive for confusion.       Depression, anxiety,decreased energy, disinterest in activities, suicidal thoughts, racing thoughts    Objective:  Neurologic Exam  Physical Exam Physical Examination:   Filed Vitals:   06/08/14 0915  BP: 107/70  Pulse: 64  Temp: 97.8 F (36.6 C)    General Examination: The patient is a very pleasant 80 y.o. male in no acute distress. He is calm and cooperative with the exam. He denies Auditory Hallucinations and Visual Hallucinations. He is well groomed and situated in a chair  HEENT: Normocephalic, atraumatic, pupils are equal, round and reactive to light and accommodation. Funduscopic exam is normal with sharp disc margins noted. Extraocular tracking shows mild saccadic breakdown without nystagmus noted. Hearing is intact. Face is symmetric with no facial masking and normal facial sensation. There is no lip, neck or jaw tremor. Neck is not rigid with intact passive ROM. There are no carotid bruits on auscultation. Oropharynx exam reveals mild mouth dryness. No significant airway crowding is noted. Mallampati is class II. Tongue protrudes centrally and palate elevates symmetrically.    Chest: is clear to auscultation without wheezing, rhonchi or crackles noted.  Heart: sounds are regular and normal without murmurs, rubs or gallops noted.   Abdomen: is soft, non-tender and non-distended with normal bowel sounds appreciated on auscultation.  Extremities: There is no pitting edema in the distal lower extremities bilaterally. Pedal pulses are intact but faint and feet are cold and slightly purplish discolored.   Skin: is warm and dry  otherwise with no trophic changes noted. Age-related changes are noted on the skin.   Musculoskeletal: exam reveals no obvious joint deformities, tenderness or joint swelling or erythema. Changes consistent with OA of the hands are noted  bilaterally.   Neurologically:  Mental status: The patient is awake and alert, paying good  attention. He is able to partially provide the history. His family provides details. He is oriented to: person, time/date, situation, day of week and month of year. His memory, attention, language and knowledge are impaired mildly. There is no aphasia, agnosia, apraxia or anomia. There is a no significant degree of bradyphrenia. Speech is not hypophonic with no dysarthria noted. Mood is congruent and affect is normal.  On 06/08/2014: MMSE 24/30, CDT: 4/4, AFT: 13/min.     Cranial nerves are as described above under HEENT exam. In addition, shoulder shrug is normal with equal shoulder height noted.  Motor exam: Normal bulk, and strength for age is noted, with the exception of mild decrease in grip strength bilaterally in the 4 out of 5 range. Tone is not rigid with absence of cogwheeling or bradykinesia. There is no drift or rebound. There is no tremor.  Romberg is negative. Reflexes are 1+ in the upper extremities and 1+ in the lower extremities, but absent in the ankles . Toes are downgoing bilaterally. Fine motor skills: Finger taps, hand movements, and rapid alternating patting are mildly impaired bilaterally. Foot taps and foot agility are mildly impaired bilaterally.   Cerebellar testing shows no dysmetria or intention tremor on finger to nose testing. Heel to shin is unremarkable. There is no truncal or gait ataxia.   Sensory exam is intact to light touch, pinprick, vibration, temperature sense in the upper and lower extremities, with the exception of decreased vibration sense in his feet.   Gait, station and balance: He stands up from the seated position with mild  difficulty and nepush himself up. No veering to one side is noted. No leaning to one side. Posture is Age-appropriate. Stance is narrow-based. He turns in 3 steps. Tandem walk is not possible. Balance is mildly impaired.   Assessment and Plan:    In summary, HANCE CASPERS is a very pleasant 80 y.o.-year old male with a complex underlying medical history of paroxysmal A. fib , coronary artery disease (followed by Dr. Bronson Ing), status post pacemaker placement in 2007 secondary to tachycardia-bradycardia syndrome, COPD, arthritis, status post left total knee replacement in 2008, degenerative back disease, status post lumbar spine surgery in the 70s, hypothyroidism, hearing loss, anxiety, depression, gout, hypertension, hyperlipidemia, ulcerative colitis (followed by Dr. Laural Golden), who report memory loss for at least one year and paresthesias in his hands and feet. On examination he has mild memory loss. This could be in keeping with mild cognitive impairment but he is also at risk for vascular dementia. I discussed this at length with the patient. Unfortunately, he is rather sensitive to medication changes and is at this time not keen on trying anything new. I suggested that we do some additional blood work and follow him clinically. As far as his paresthesias, he could have a combination of peripheral vascular disease, arthritis and paresthesias secondary to medications including his methotrexate. Symptoms are somewhat mild at this time. He has some arthritic changes in his hands. His pulses in the distal lower extremities are faint. He is advised to continue with the current management and stay active mentally and physically. We will 2 additional blood work for now and follow him clinically. I would like to see his overnight pulse oximetry test results from last year. Based on this he may want to start seeing a local pulmonologist for optimization of his COPD treatment because his pulmonologist in [place]  really just sees him on a yearly basis for his history of his best is exposure.   I encouraged the patient to eat healthy, exercise daily and keep well hydrated, to keep a scheduled bedtime and wake time routine, to not skip any meals and eat healthy snacks in between meals and to have protein with every meal. I stressed the importance of regular exercise, within of course the patient's own mobility limitations. I encouraged the patient to keep up with current events by reading the news paper or watching the news and to do word puzzles, or if feasible, to go on [email or web address].   I answered all their questions today and the patient and his family were in agreement with the above outlined plan. I would like to see the patient back in 3 or 4 months, sooner if the need arises and encouraged them to call with any interim questions, concerns, problems, updates.  I spent 60 min in total face-to-face time with the patient, more 50% of which was spent in counseling and coordination of care, reviewing test results, reviewing medication and reviewing the diagnosis of dementia, mild cognitive impairment, vascular disease, and paresthesias, the prognosis and treatment options.   Thank you very much for allowing me to participate in the care of this nice patient. If I can be of any further assistance to you please do not hesitate to call me at [phone].  Sincerely,   Star Age, MD, PhD  I reviewed the patient's ONO (overnight pulse oximetry report) from 04/09/14, while on room air. His baseline oxygen saturation for the night was 96.2% and minimum oxygen saturation was 92%. Time below 88% saturation was 0 minutes.   Star Age, MD, PhD [facility])

## 2014-06-08 NOTE — Patient Instructions (Signed)
Your memory loss is mild.  You have no overt nerve damage on exam.  You may need to see a lung doctor more often than once a year.  You can still see Dr. Davina Poke once a year.   I think overall you are doing fairly well but I do want to suggest a few things today:  Remember to drink plenty of fluid, eat healthy meals and do not skip any meals. Try to eat protein with a every meal and eat a healthy snack such as fruit or nuts in between meals. Try to keep a regular sleep-wake schedule and try to exercise daily, particularly in the form of walking, 20-30 minutes a day, if you can. Good nutrition, proper sleep and exercise can help her cognitive function.  Engage in social activities in your community and with your family and try to keep up with current events by reading the newspaper or watching the news. If you have computer and can go online, try [email or web address]. Also, you may like to do word finding puzzles or crossword puzzles.  As far as your medications are concerned, I would like to suggest no new medication at this time. Talk to Dr. Dagmar Hait about trying Voltaren Gel for arthritis pain.   As far as diagnostic testing: blood tests and we will call you.  I would like to see you back in 3 months, sooner if we need to. Please call us with any interim questions, concerns, problems, updates or refill requests.  Our phone number is [phone]. We also have an after hours call service for urgent matters and there is a physician on-call for urgent questions. For any emergencies you know to call 911 or go to the nearest emergency room.

## 2014-06-10 NOTE — Telephone Encounter (Signed)
This needs to be done by his dermatologist since it is not being used for inflammatory bowel disease, Please call patient

## 2014-06-11 NOTE — Progress Notes (Signed)
Quick Note:  Pls call daughter back: Dr. Augustina Mood.  Labs are good, but couple of results are pending.  I also received the pulse oximetry test from November 2015 for [facility], which looked fine as well. He did not have any significant desaturations. Star Age, MD, PhD [facility] ([facility]  ______

## 2014-06-12 LAB — C-REACTIVE PROTEIN: CRP: 3.4 mg/L (ref 0.0–4.9)

## 2014-06-12 LAB — IFE AND PE, SERUM
Albumin SerPl Elph-Mcnc: 4.2 g/dL (ref 3.2–5.6)
Albumin/Glob SerPl: 1.5 (ref 0.7–2.0)
Alpha 1: 0.2 g/dL (ref 0.1–0.4)
Alpha2 Glob SerPl Elph-Mcnc: 0.8 g/dL (ref 0.4–1.2)
B-Globulin SerPl Elph-Mcnc: 0.9 g/dL (ref 0.6–1.3)
Gamma Glob SerPl Elph-Mcnc: 1 g/dL (ref 0.5–1.6)
Globulin, Total: 2.9 g/dL (ref 2.0–4.5)
IgA/Immunoglobulin A, Serum: 135 mg/dL (ref 91–414)
IgG (Immunoglobin G), Serum: 944 mg/dL (ref 700–1600)
IgM (Immunoglobulin M), Srm: 133 mg/dL (ref 40–230)
Total Protein: 7.1 g/dL (ref 6.0–8.5)

## 2014-06-12 LAB — RHEUMATOID FACTOR: RHEUMATOID FACTOR: 8.7 [IU]/mL (ref 0.0–13.9)

## 2014-06-12 LAB — RPR: RPR Ser Ql: NONREACTIVE

## 2014-06-12 LAB — VITAMIN B1, WHOLE BLOOD: Thiamine: 183.3 nmol/L (ref 66.5–200.0)

## 2014-06-12 LAB — ANA W/REFLEX: ANA: NEGATIVE

## 2014-06-12 LAB — SEDIMENTATION RATE: Sed Rate: 5 mm/hr (ref 0–30)

## 2014-06-12 NOTE — Telephone Encounter (Signed)
Mrs. Wenner called and made aware of Dr.[name]'s recommendation. Patient has sen a doctor last Friday and they are checking him for Lyme's disease. Patient has an appointment with [place] 06/13/14.

## 2014-06-13 ENCOUNTER — Ambulatory Visit (INDEPENDENT_AMBULATORY_CARE_PROVIDER_SITE_OTHER): Payer: Medicare Other | Admitting: Internal Medicine

## 2014-06-13 ENCOUNTER — Encounter (INDEPENDENT_AMBULATORY_CARE_PROVIDER_SITE_OTHER): Payer: Self-pay | Admitting: Internal Medicine

## 2014-06-13 VITALS — BP 132/78 | HR 76 | Temp 98.9°F | Resp 18 | Ht 73.0 in | Wt 158.4 lb

## 2014-06-13 DIAGNOSIS — R630 Anorexia: Secondary | ICD-10-CM

## 2014-06-13 DIAGNOSIS — K51918 Ulcerative colitis, unspecified with other complication: Secondary | ICD-10-CM | POA: Diagnosis not present

## 2014-06-13 DIAGNOSIS — L299 Pruritus, unspecified: Secondary | ICD-10-CM | POA: Diagnosis not present

## 2014-06-13 MED ORDER — PREDNISONE 10 MG PO TABS
30.0000 mg | ORAL_TABLET | Freq: Every day | ORAL | Status: DC
Start: 2014-06-13 — End: 2014-09-01

## 2014-06-13 NOTE — Progress Notes (Signed)
Presenting complaint;  Follow-up for ulcerative colitis. Patient has multiple symptoms.  Subjective:  Patient is 80 year old Caucasian male who presents for scheduled visit accompanied by his wife. He was last seen 6 months ago. He remains on oral mesalamine. He states he is having one formed stool and occasionally he may have a second bowel movement. He denies diarrhea melena rectal bleeding or abdominal pain. He is not having any side effects with this medication. He has poor appetite. He has lost 5 pounds in 6 months. He denies nausea vomiting heartburn or dysphagia. He has several other complaints. He remains with pruritus which she describes to be intractable. He is taking Benadryl at night so that he could sleep. Patient's wife called last week and requested prescription for methotrexate because this is the only medication which has helped treat itching. I recommended deferring this decision to patient's dermatologist Dr. Tarri Glenn. He has an appointment with Dr. Tarri Glenn next month. He also complains of memory problems and paresthesias in his extremities. He is seeing Dr. Star Age of [facility]. Patient states he does not feel like doing anything. He read some and likes to watch sports on TV.   Current Medications: Outpatient Encounter Prescriptions as of 06/13/2014  Medication Sig  . Cholecalciferol (VITAMIN D3) 400 UNITS CAPS Take by mouth daily.  Marland Kitchen edoxaban (SAVAYSA) 60 MG TABS tablet Take 60 mg by mouth daily.  . isosorbide mononitrate (IMDUR) 30 MG 24 hr tablet Take 1 tablet (30 mg total) by mouth at bedtime.  Marland Kitchen levothyroxine (SYNTHROID, LEVOTHROID) 50 MCG tablet Take 50 mcg by mouth daily before breakfast.   . Mesalamine (ASACOL) 400 MG CPDR DR capsule Take 400 mg by mouth. Patient takes 2 by mouth twice a day  . metoprolol (LOPRESSOR) 50 MG tablet Take 1 tablet (50 mg total) by mouth 2 (two) times daily.  . Multiple Vitamins-Minerals (CENTRUM PO) Take by mouth  daily.  . [DISCONTINUED] Mesalamine (ASACOL) 400 MG CPDR DR capsule Take by mouth 2 (two) times daily. 3 capsules two times daily     Objective: Blood pressure 132/78, pulse 76, temperature 98.9 F (37.2 C), temperature source Oral, resp. rate 18, height 6' 1"  (1.854 m), weight 158 lb 6.4 oz (71.85 kg). Patient is alert and in no acute distress. Conjunctiva is pink. Sclera is nonicteric Oropharyngeal mucosa is normal. No neck masses or thyromegaly noted. Cardiac exam with regular rhythm normal S1 and S2. No murmur or gallop noted. Lungs are clear to auscultation. Abdomen is flat and soft without tenderness again a megaly or masses.  Extremities thin but no clubbing or edema noted.  Labs/studies Results: Lab data from 06/08/2014(Dr. Jonita Albee office). Sedimentation rate 5 CRP 3.4 which is within normal limits ANA is negative. Rheumatoid factor 8.7 which is also within normal limits RPR nonreactive. Serum immunoglobulins normal(full report can be accessed in Epic).  Assessment:  #1. Ulcerative colitis. Patient remains in remission. He is tolerating oral mesalamine without any side effects. There is no indication for methotrexate for IBD. #2. Intractable pruritus. He has been worked up in the past and thought to have autoimmune etiology and either spondylytic to methotrexate. Methotrexate was discontinued because patient's daughter and wife had been concerned of potential side effects. Patient does not have any evidence of chronic liver disease and I do not have any objection for him to go back on methotrexate but this will be left up to Dr. Tarri Glenn. Until this decision is made with treated with prednisone. #3. Anorexia and  weight loss appears to be multifactorial. He must increase physical activity as it will improve his appetite and his well-being.   Plan:  Continue Asacol at 800 mg by mouth twice a day. Prednisone 10 mg by mouth daily. Patient will increase dose to 20 mg after  3 days if he has no side effects and increase it again after 3 days to 30 mg daily. Thereafter he will drop those by 5 mg every 3 days. Patient's wife asked to call office with progress report in 2 weeks. I will touch bases with Dr. Tarri Glenn regarding methotrexate use for his pruritus and rash. Office visit in 6 months.

## 2014-06-13 NOTE — Patient Instructions (Signed)
Prednisone 10 mg every morning; increase dose to 20 mg after 3 doses if itching not improved and no side effects experienced. Can increase dose to 30 mg every morning after another 3 days if no side effects experienced. Continue prednisone 30 mg daily for 3 days then Drop Dose by 5 mg every 3 days. Please call office with progress report in 2 weeks. Marland Kitchen

## 2014-06-18 ENCOUNTER — Telehealth: Payer: Self-pay | Admitting: *Deleted

## 2014-06-18 NOTE — Telephone Encounter (Signed)
Called pt to confirm appt 2/9 pt wife wanted Dr. Bronson Ing to know that insurance does not cover Savaysa and could pt go back on Xarelto. Will forward to Dr. Bronson Ing as Juluis Rainier for tomorrow's appt.

## 2014-06-19 ENCOUNTER — Telehealth: Payer: Self-pay | Admitting: Neurology

## 2014-06-19 ENCOUNTER — Encounter: Payer: Self-pay | Admitting: *Deleted

## 2014-06-19 ENCOUNTER — Encounter: Payer: Self-pay | Admitting: Cardiovascular Disease

## 2014-06-19 ENCOUNTER — Telehealth: Payer: Self-pay | Admitting: Cardiovascular Disease

## 2014-06-19 ENCOUNTER — Other Ambulatory Visit: Payer: Self-pay | Admitting: Cardiovascular Disease

## 2014-06-19 ENCOUNTER — Ambulatory Visit (INDEPENDENT_AMBULATORY_CARE_PROVIDER_SITE_OTHER): Payer: Medicare Other | Admitting: Cardiovascular Disease

## 2014-06-19 VITALS — BP 131/85 | HR 65 | Ht 73.5 in | Wt 157.0 lb

## 2014-06-19 DIAGNOSIS — Z7901 Long term (current) use of anticoagulants: Secondary | ICD-10-CM | POA: Diagnosis not present

## 2014-06-19 DIAGNOSIS — R079 Chest pain, unspecified: Secondary | ICD-10-CM

## 2014-06-19 DIAGNOSIS — I25118 Atherosclerotic heart disease of native coronary artery with other forms of angina pectoris: Secondary | ICD-10-CM

## 2014-06-19 DIAGNOSIS — E785 Hyperlipidemia, unspecified: Secondary | ICD-10-CM | POA: Diagnosis not present

## 2014-06-19 DIAGNOSIS — Z5181 Encounter for therapeutic drug level monitoring: Secondary | ICD-10-CM

## 2014-06-19 DIAGNOSIS — R5383 Other fatigue: Secondary | ICD-10-CM

## 2014-06-19 DIAGNOSIS — I10 Essential (primary) hypertension: Secondary | ICD-10-CM | POA: Diagnosis not present

## 2014-06-19 DIAGNOSIS — Z950 Presence of cardiac pacemaker: Secondary | ICD-10-CM

## 2014-06-19 DIAGNOSIS — J449 Chronic obstructive pulmonary disease, unspecified: Secondary | ICD-10-CM

## 2014-06-19 DIAGNOSIS — I4891 Unspecified atrial fibrillation: Secondary | ICD-10-CM

## 2014-06-19 DIAGNOSIS — I495 Sick sinus syndrome: Secondary | ICD-10-CM

## 2014-06-19 MED ORDER — NITROGLYCERIN 0.4 MG SL SUBL: 0.4000 mg | SUBLINGUAL_TABLET | SUBLINGUAL | Status: DC | PRN

## 2014-06-19 MED ORDER — RIVAROXABAN 20 MG PO TABS: 20.0000 mg | ORAL_TABLET | Freq: Every day | ORAL | Status: DC

## 2014-06-19 MED ORDER — ISOSORBIDE MONONITRATE ER 30 MG PO TB24: 30.0000 mg | ORAL_TABLET | Freq: Two times a day (BID) | ORAL | Status: DC

## 2014-06-19 NOTE — Progress Notes (Signed)
Patient ID: [name], male   DOB: 04/29/1935, 80 y.o.   MRN: [id number]      SUBJECTIVE: The patient presents for routine cardiovascular follow up. In summary, he is a 80-year-old male with a history of atrial fibrillation, hypertension, hyperlipidemia, coronary artery disease, and has a pacemaker for tachycardia-bradycardia syndrome. His most recent cardiac catheterization was performed in May 2012 by Dr. Hochrein which demonstrated tandem 50% lesions in the mid LAD. The first obtuse marginal branch was occluded but received collateral flow, and was responsible for a prior defect seen on nuclear stress testing.  He also has COPD and hypothyroidism. Lexiscan Cardiolite stress test in 10/2013 demonstrated a fixed inferolateral defect which was consistent with myocardial scar versus attenuation artifact.  PFTs demonstrated moderate airflow obstruction with moderate to severely reduced DLCO. He sees a pulmonologist in [place], Dr. O'Neal.  He has been dealing with pruritus and follows with a dermatologist. ECG performed in the office today shows atrial fibrillation, HR 78 bpm, and RBBB and LAFB.  He awoke with severe, sharp precordial chest pains yesterday morning while still lying in bed. He then had episodes of chest pain yesterday, but less severe and not lasting as long. The initial episode may have lasted a minute. It was not associated with shortness of breath. He has remained fatigued with no identifiable etiology for at least a year. He used to me much more active as per family members. Nuclear stress test in 6/15 did not reveal any ischemic territories. Coronary angiography on 09/18/10 showed mid LAD 50% stenosis with tandem 50% stenoses more distally, 30% 1st diagonal lesion, occluded OM1 with collaterals, and 25% long RCA stenosis. Neurology ordered a battery of blood tests which were unremarkable and included ESR, CRP, and other titers.  Soc: Married. Daughter (Lori) moved to [place] from [place]. Daughter (Dr. Sandra Stones, dentist) lives in [place]. Son Lee also lives in [place].   Review of Systems: As per "subjective", otherwise negative.  Allergies  Allergen Reactions  . Nsaids     colitis    Current Outpatient Prescriptions  Medication Sig Dispense Refill  . Cholecalciferol (VITAMIN D) 2000 UNITS CAPS Take 1 capsule by mouth daily.    . edoxaban (SAVAYSA) 60 MG TABS tablet Take 60 mg by mouth daily. 30 tablet 1  . isosorbide mononitrate (IMDUR) 30 MG 24 hr tablet Take 1 tablet (30 mg total) by mouth at bedtime. 90 tablet 3  . levothyroxine (SYNTHROID, LEVOTHROID) 50 MCG tablet Take 50 mcg by mouth daily before breakfast.     . Mesalamine (ASACOL) 400 MG CPDR DR capsule Take 400 mg by mouth. Patient takes 2 by mouth twice a day    . metoprolol (LOPRESSOR) 50 MG tablet Take 1 tablet (50 mg total) by mouth 2 (two) times daily. 60 tablet 3  . Multiple Vitamins-Minerals (CENTRUM PO) Take 1 tablet by mouth daily.     . predniSONE (DELTASONE) 10 MG tablet Take 3 tablets (30 mg total) by mouth daily with breakfast. 60 tablet 0   No current facility-administered medications for this visit.    Past Medical History  Diagnosis Date  . Hypertension   . Dyslipidemia   . Atrial fibrillation   . CAD (coronary artery disease)     a.  cath 5/12: mLAD 50% then tandem 50%, dLAD 30%; D1 30%; OM1 occluded with collateral flow; pRCA 25%; EF 55% with inf HK;  occluded OM likely cause of perfusion defect on Myoview  . Tachy-brady syndrome       s/p pacer  . Ulcerative colitis   . Dizziness   . Nonspecific abnormal unspecified cardiovascular function study   . GERD (gastroesophageal reflux disease)   . Arthritis   . Arrhythmia   . Pelvic fracture   . Hyperlipidemia   . Depression   . Anxiety   . Gout   . Dementia     short term  . Skin disorder     blister formation  . Hearing disorder   . Hypothyroidism   . COPD (chronic obstructive pulmonary disease)   . Colitis     . Pacemaker     Past Surgical History  Procedure Laterality Date  . Implantation of a medtronic dual chamber pacemaker  02/01/06    [name] [name] MD  . Left knee arthroscopy with debridement  04/23/2006    [name] M.D  . Left knee total knee  replacement  05/13/07    [name] M.D.  . Knee surgery  2005    replacement  . Back surgery  1975  . Lumbar spine surgery    . Insert / replace / remove pacemaker      MEDTRONIC....PPM Serial Number:  [id number].....Marland KitchenPPM Model Number:  [id number]....Marland KitchenPPM DOI:  02/01/2006    . Cardiac catheterization  09/20/10    EF 55%.....Dr. Percival Spanish    History   Social History  . Marital Status: Married    Spouse Name: [name]    Number of Children: 3  . Years of Education: 12   Occupational History  . retired    Social History Main Topics  . Smoking status: Former Smoker -- 1.00 packs/day for 15 years    Types: Cigarettes    Start date: 05/13/1955    Quit date: 05/11/1978  . Smokeless tobacco: Never Used  . Alcohol Use: No  . Drug Use: No  . Sexual Activity: Not on file   Other Topics Concern  . Not on file   Social History Narrative   One cup of caffeine daily     Filed Vitals:   06/19/14 0855  BP: 131/85  Pulse: 65  Height: 6' 1.5" (1.867 m)  Weight: 157 lb (71.215 kg)   BP 131/85  Pulse 65   PHYSICAL EXAM General: NAD HEENT: Normal. Neck: No JVD, no thyromegaly. Lungs: Clear to auscultation bilaterally with normal respiratory effort. CV: Nondisplaced PMI.  Irregular rhythm, normal rate, normal S1/S2, no S3, no murmur. No pretibial or periankle edema.    Abdomen: Soft, nontender, no distention.  Neurologic: Alert and oriented x 3.  Psych: Normal affect. Skin: Normal. Musculoskeletal: No gross deformities. Extremities: No clubbing or cyanosis.   ECG: Most recent ECG reviewed.      ASSESSMENT AND PLAN: 1. Chest pain and ongoing fatigue in the context of underlying CAD: Given the ongoing fatigue  and now symptoms of chest pain, I will proceed with coronary angiography to effectively rule out ischemic heart disease as a potential etiology. Prior cath results from 09/2010 noted above. I will increase Imdur to 30 mg bid and prescribe SL nitroglycerin. I had a long discussion with the patient and his wife regarding this management strategy and they are in agreement. I will hold anticoagulation for at least 48 hours beforehand. 2. Atrial fibrillation: Device interrogation on 04/11/14 demonstrated normal device function and with 212 mode switches, 2.2% burden, and one HR of 160 bpm. Metoprolol was increased by me for an ECG confirming an elevated HR. Due to insurance reasons, I will switch Savaysa to  Xarelto 20 mg daily. 3. Essential HTN: Controlled on current therapy. No changes. 4. Pacemaker: Device interrogation on 04/11/14 demonstrated normal device function and with 212 mode switches, 2.2% burden, and one HR of 160 bpm. Metoprolol was increased by me for an ECG confirming an elevated HR. Follows with Dr. Allred. 5. Hyperlipidemia: I will try and obtain the results of his most recent lipid panel. Notably he is not on statin therapy.  6. COPD: On Symbicort. Followed in [place] by Dr. O'Neal. PFT results previously reviewed.  Dispo: f/u after cath  Time spent: 40 minutes, of which >50% spent discussing all prior testing and present management strategy to assess etiology of chest pain and ongoing fatigue.  Suresh Koneswaran, M.D., F.A.C.C.  

## 2014-06-19 NOTE — Telephone Encounter (Signed)
Patient's daughter checking status of remaining blood works results.  Please call and advise.

## 2014-06-19 NOTE — Patient Instructions (Signed)
   Increase Imdur to 79m twice a day  - [facility] 90 day [facility].  Begin Nitroglycerin as needed for severe chest pain only [facility]  Stop Sayvaysa  Begin Xarelto 226mdaily with evening meal - samples & scripts given today. Continue all other medications.   Your physician has requested that you have a cardiac catheterization. Cardiac catheterization is used to diagnose and/or treat various heart conditions. Doctors may recommend this procedure for a number of different reasons. The most common reason is to evaluate chest pain. Chest pain can be a symptom of coronary artery disease (CAD), and cardiac catheterization can show whether plaque is narrowing or blocking your heart's arteries. This procedure is also used to evaluate the valves, as well as measure the blood flow and oxygen levels in different parts of your heart. For further information please visit [email or web address] follow instruction sheet, as given. Follow up will be given at time of discharge from procedure above.

## 2014-06-19 NOTE — Telephone Encounter (Signed)
No precert required 

## 2014-06-19 NOTE — Telephone Encounter (Signed)
Left heart cath - Tamala Julian - 9:00 - Friday, 9/50 Checking percert

## 2014-06-19 NOTE — Addendum Note (Signed)
Addended by: Laurine Blazer on: 06/19/2014 10:58 AM   Modules accepted: Orders

## 2014-06-20 NOTE — Telephone Encounter (Signed)
Called and left message for patient's daughter to call back concerning patient coming in to have lyme titer  labs drawn.

## 2014-06-20 NOTE — Telephone Encounter (Signed)
All labs are fine. However, the Lyme titer was never drawn it looks like. He may have to come back and get that drawn. Please advise daughter.

## 2014-06-21 NOTE — Telephone Encounter (Signed)
Patient's daughter called and stated patient will have additional blood work drawn at PCP on 2/16.  FYI

## 2014-06-22 ENCOUNTER — Other Ambulatory Visit (INDEPENDENT_AMBULATORY_CARE_PROVIDER_SITE_OTHER): Payer: Self-pay | Admitting: Internal Medicine

## 2014-06-22 ENCOUNTER — Telehealth (INDEPENDENT_AMBULATORY_CARE_PROVIDER_SITE_OTHER): Payer: Self-pay | Admitting: *Deleted

## 2014-06-22 NOTE — Telephone Encounter (Signed)
Pharmacy needs to send a slip so I will know the great dosage.

## 2014-06-22 NOTE — Telephone Encounter (Signed)
error 

## 2014-06-22 NOTE — Telephone Encounter (Signed)
This was called to [facility] in [name] 400 mg take 2 by mouth twice daily #120 1 refill. Note wife requested that 1 month be called to this Pharmacy. Patient was called and made aware. Also made aware that Dr.Beavers had not returned Dr.[name]'s call, per Dr.[name].

## 2014-06-22 NOTE — Telephone Encounter (Signed)
[name] is needing Delizcol 30 day sent to [facility].

## 2014-06-22 NOTE — Telephone Encounter (Signed)
Per Dr.[name] - May call in Delzicol 400 mg to [facility] Patient takes 2 by mouth twice daily.

## 2014-06-22 NOTE — Telephone Encounter (Signed)
[name], Terri sent this back to me. Will you please take a look at this?

## 2014-07-02 DIAGNOSIS — L298 Other pruritus: Secondary | ICD-10-CM | POA: Diagnosis not present

## 2014-07-02 DIAGNOSIS — Z6821 Body mass index (BMI) 21.0-21.9, adult: Secondary | ICD-10-CM | POA: Diagnosis not present

## 2014-07-02 DIAGNOSIS — E039 Hypothyroidism, unspecified: Secondary | ICD-10-CM | POA: Diagnosis not present

## 2014-07-02 DIAGNOSIS — I2510 Atherosclerotic heart disease of native coronary artery without angina pectoris: Secondary | ICD-10-CM | POA: Diagnosis not present

## 2014-07-02 DIAGNOSIS — R413 Other amnesia: Secondary | ICD-10-CM | POA: Diagnosis not present

## 2014-07-02 DIAGNOSIS — Z1389 Encounter for screening for other disorder: Secondary | ICD-10-CM | POA: Diagnosis not present

## 2014-07-02 DIAGNOSIS — F39 Unspecified mood [affective] disorder: Secondary | ICD-10-CM | POA: Diagnosis not present

## 2014-07-02 DIAGNOSIS — J449 Chronic obstructive pulmonary disease, unspecified: Secondary | ICD-10-CM | POA: Diagnosis not present

## 2014-07-06 ENCOUNTER — Encounter (HOSPITAL_COMMUNITY): Payer: Self-pay | Admitting: Interventional Cardiology

## 2014-07-06 ENCOUNTER — Ambulatory Visit (HOSPITAL_COMMUNITY)
Admission: RE | Admit: 2014-07-06 | Discharge: 2014-07-06 | Disposition: A | Discharge status: Home / Self Care | Payer: Medicare Other | Source: Ambulatory Visit | Attending: Interventional Cardiology | Admitting: Interventional Cardiology

## 2014-07-06 ENCOUNTER — Encounter (HOSPITAL_COMMUNITY)
Admission: RE | Disposition: A | Discharge status: Home / Self Care | Payer: Self-pay | Source: Ambulatory Visit | Attending: Interventional Cardiology

## 2014-07-06 DIAGNOSIS — I2510 Atherosclerotic heart disease of native coronary artery without angina pectoris: Secondary | ICD-10-CM | POA: Diagnosis present

## 2014-07-06 DIAGNOSIS — Z950 Presence of cardiac pacemaker: Secondary | ICD-10-CM | POA: Diagnosis not present

## 2014-07-06 DIAGNOSIS — I25118 Atherosclerotic heart disease of native coronary artery with other forms of angina pectoris: Secondary | ICD-10-CM | POA: Insufficient documentation

## 2014-07-06 DIAGNOSIS — E039 Hypothyroidism, unspecified: Secondary | ICD-10-CM | POA: Diagnosis not present

## 2014-07-06 DIAGNOSIS — I4891 Unspecified atrial fibrillation: Secondary | ICD-10-CM | POA: Insufficient documentation

## 2014-07-06 DIAGNOSIS — I495 Sick sinus syndrome: Secondary | ICD-10-CM | POA: Insufficient documentation

## 2014-07-06 DIAGNOSIS — M109 Gout, unspecified: Secondary | ICD-10-CM | POA: Diagnosis not present

## 2014-07-06 DIAGNOSIS — I10 Essential (primary) hypertension: Secondary | ICD-10-CM | POA: Diagnosis present

## 2014-07-06 DIAGNOSIS — J449 Chronic obstructive pulmonary disease, unspecified: Secondary | ICD-10-CM | POA: Diagnosis not present

## 2014-07-06 DIAGNOSIS — Z87891 Personal history of nicotine dependence: Secondary | ICD-10-CM | POA: Insufficient documentation

## 2014-07-06 DIAGNOSIS — E785 Hyperlipidemia, unspecified: Secondary | ICD-10-CM | POA: Diagnosis not present

## 2014-07-06 DIAGNOSIS — R079 Chest pain, unspecified: Secondary | ICD-10-CM

## 2014-07-06 DIAGNOSIS — K219 Gastro-esophageal reflux disease without esophagitis: Secondary | ICD-10-CM | POA: Insufficient documentation

## 2014-07-06 DIAGNOSIS — F0390 Unspecified dementia without behavioral disturbance: Secondary | ICD-10-CM | POA: Diagnosis not present

## 2014-07-06 DIAGNOSIS — Z79899 Other long term (current) drug therapy: Secondary | ICD-10-CM | POA: Insufficient documentation

## 2014-07-06 DIAGNOSIS — Z7901 Long term (current) use of anticoagulants: Secondary | ICD-10-CM | POA: Insufficient documentation

## 2014-07-06 DIAGNOSIS — F329 Major depressive disorder, single episode, unspecified: Secondary | ICD-10-CM | POA: Diagnosis not present

## 2014-07-06 DIAGNOSIS — M1990 Unspecified osteoarthritis, unspecified site: Secondary | ICD-10-CM | POA: Diagnosis not present

## 2014-07-06 DIAGNOSIS — R5383 Other fatigue: Secondary | ICD-10-CM

## 2014-07-06 HISTORY — PX: LEFT HEART CATHETERIZATION WITH CORONARY ANGIOGRAM: SHX5451

## 2014-07-06 LAB — PROTIME-INR
INR: 1.08 (ref 0.00–1.49)
PROTHROMBIN TIME: 14.1 s (ref 11.6–15.2)

## 2014-07-06 LAB — CBC
HCT: 40.2 % (ref 39.0–52.0)
HEMOGLOBIN: 13.9 g/dL (ref 13.0–17.0)
MCH: 30.6 pg (ref 26.0–34.0)
MCHC: 34.6 g/dL (ref 30.0–36.0)
MCV: 88.5 fL (ref 78.0–100.0)
Platelets: 179 10*3/uL (ref 150–400)
RBC: 4.54 MIL/uL (ref 4.22–5.81)
RDW: 14.6 % (ref 11.5–15.5)
WBC: 8 10*3/uL (ref 4.0–10.5)

## 2014-07-06 LAB — BASIC METABOLIC PANEL
Anion gap: 10 (ref 5–15)
BUN: 16 mg/dL (ref 6–23)
CHLORIDE: 104 mmol/L (ref 96–112)
CO2: 24 mmol/L (ref 19–32)
CREATININE: 1.08 mg/dL (ref 0.50–1.35)
Calcium: 9 mg/dL (ref 8.4–10.5)
GFR calc non Af Amer: 64 mL/min — ABNORMAL LOW (ref >90)
GFR, EST AFRICAN AMERICAN: 74 mL/min — AB (ref >90)
Glucose, Bld: 87 mg/dL (ref 70–99)
Potassium: 4.1 mmol/L (ref 3.5–5.1)
Sodium: 138 mmol/L (ref 135–145)

## 2014-07-06 SURGERY — LEFT HEART CATHETERIZATION WITH CORONARY ANGIOGRAM

## 2014-07-06 MED ORDER — ACETAMINOPHEN 325 MG PO TABS: 650.0000 mg | ORAL_TABLET | ORAL | Status: DC | PRN

## 2014-07-06 MED ORDER — ASPIRIN 81 MG PO CHEW
81.0000 mg | CHEWABLE_TABLET | ORAL | Status: AC
  Administered 2014-07-06: 81 mg via ORAL

## 2014-07-06 MED ORDER — LIDOCAINE HCL (PF) 1 % IJ SOLN
INTRAMUSCULAR | Status: AC
  Filled 2014-07-06: qty 30

## 2014-07-06 MED ORDER — SODIUM CHLORIDE 0.9 % IV SOLN
INTRAVENOUS | Status: DC
  Administered 2014-07-06: 08:00:00 via INTRAVENOUS

## 2014-07-06 MED ORDER — SODIUM CHLORIDE 0.9 % IV SOLN: 250.0000 mL | INTRAVENOUS | Status: DC | PRN

## 2014-07-06 MED ORDER — NITROGLYCERIN 1 MG/10 ML FOR IR/CATH LAB
INTRA_ARTERIAL | Status: AC
  Filled 2014-07-06: qty 10

## 2014-07-06 MED ORDER — ASPIRIN 81 MG PO CHEW
CHEWABLE_TABLET | ORAL | Status: AC
  Filled 2014-07-06: qty 1

## 2014-07-06 MED ORDER — HEPARIN SODIUM (PORCINE) 1000 UNIT/ML IJ SOLN
INTRAMUSCULAR | Status: AC
  Filled 2014-07-06: qty 1

## 2014-07-06 MED ORDER — OXYCODONE-ACETAMINOPHEN 5-325 MG PO TABS: 1.0000 | ORAL_TABLET | ORAL | Status: DC | PRN

## 2014-07-06 MED ORDER — SODIUM CHLORIDE 0.9 % IJ SOLN: 3.0000 mL | Freq: Two times a day (BID) | INTRAMUSCULAR | Status: DC

## 2014-07-06 MED ORDER — ONDANSETRON HCL 4 MG/2ML IJ SOLN: 4.0000 mg | Freq: Four times a day (QID) | INTRAMUSCULAR | Status: DC | PRN

## 2014-07-06 MED ORDER — SODIUM CHLORIDE 0.9 % IV SOLN
INTRAVENOUS | Status: AC
  Administered 2014-07-06: 11:00:00 via INTRAVENOUS

## 2014-07-06 MED ORDER — SODIUM CHLORIDE 0.9 % IJ SOLN: 3.0000 mL | INTRAMUSCULAR | Status: DC | PRN

## 2014-07-06 MED ORDER — MIDAZOLAM HCL 2 MG/2ML IJ SOLN
INTRAMUSCULAR | Status: AC
  Filled 2014-07-06: qty 2

## 2014-07-06 MED ORDER — ACETAMINOPHEN 325 MG PO TABS
ORAL_TABLET | ORAL | Status: AC
  Filled 2014-07-06: qty 2

## 2014-07-06 MED ORDER — VERAPAMIL HCL 2.5 MG/ML IV SOLN
INTRAVENOUS | Status: AC
  Filled 2014-07-06: qty 2

## 2014-07-06 MED ORDER — FENTANYL CITRATE 0.05 MG/ML IJ SOLN
INTRAMUSCULAR | Status: AC
  Filled 2014-07-06: qty 2

## 2014-07-06 MED ORDER — HEPARIN (PORCINE) IN NACL 2-0.9 UNIT/ML-% IJ SOLN
INTRAMUSCULAR | Status: AC
Start: 2014-07-06 — End: 2014-07-06
  Filled 2014-07-06: qty 1000

## 2014-07-06 NOTE — Discharge Instructions (Signed)
Radial Site Care °Refer to this sheet in the next few weeks. These instructions provide you with information on caring for yourself after your procedure. Your caregiver may also give you more specific instructions. Your treatment has been planned according to current medical practices, but problems sometimes occur. Call your caregiver if you have any problems or questions after your procedure. °HOME CARE INSTRUCTIONS °· You may shower the day after the procedure. Remove the bandage (dressing) and gently wash the site with plain soap and water. Gently pat the site dry. °· Do not apply powder or lotion to the site. °· Do not submerge the affected site in water for 3 to 5 days. °· Inspect the site at least twice daily. °· Do not flex or bend the affected arm for 24 hours. °· No lifting over 5 pounds (2.3 kg) for 5 days after your procedure. °· Do not drive home if you are discharged the same day of the procedure. Have someone else drive you. °· You may drive 24 hours after the procedure unless otherwise instructed by your caregiver. °· Do not operate machinery or power tools for 24 hours. °· A responsible adult should be with you for the first 24 hours after you arrive home. °What to expect: °· Any bruising will usually fade within 1 to 2 weeks. °· Blood that collects in the tissue (hematoma) may be painful to the touch. It should usually decrease in size and tenderness within 1 to 2 weeks. °SEEK IMMEDIATE MEDICAL CARE IF: °· You have unusual pain at the radial site. °· You have redness, warmth, swelling, or pain at the radial site. °· You have drainage (other than a small amount of blood on the dressing). °· You have chills. °· You have a fever or persistent symptoms for more than 72 hours. °· You have a fever and your symptoms suddenly get worse. °· Your arm becomes pale, cool, tingly, or numb. °· You have heavy bleeding from the site. Hold pressure on the site. °Document Released: 05/30/2010 Document Revised:  07/20/2011 Document Reviewed: 05/30/2010 °ExitCare® Patient Information ©2015 [employer]. This information is not intended to replace advice given to you by your health care provider. Make sure you discuss any questions you have with your health care provider. ° °

## 2014-07-06 NOTE — Interval H&P Note (Signed)
Cath Lab Visit (complete for each Cath Lab visit)  Clinical Evaluation Leading to the Procedure:   ACS: Yes.    Non-ACS:    Anginal Classification: CCS III  Anti-ischemic medical therapy: Maximal Therapy (2 or more classes of medications)  Non-Invasive Test Results: No non-invasive testing performed  Prior CABG: No previous CABG      History and Physical Interval Note:  07/06/2014 10:34 AM  [name]  has presented today for surgery, with the diagnosis of cp/fatigue  The various methods of treatment have been discussed with the patient and family. After consideration of risks, benefits and other options for treatment, the patient has consented to  Procedure(s): LEFT HEART CATHETERIZATION WITH CORONARY ANGIOGRAM (N/A) as a surgical intervention .  The patient's history has been reviewed, patient examined, no change in status, stable for surgery.  I have reviewed the patient's chart and labs.  Questions were answered to the patient's satisfaction.     Sinclair Grooms

## 2014-07-06 NOTE — CV Procedure (Signed)
     Left Heart Catheterization with Coronary Angiography  Report  SKILER OLDEN  80 y.o.  male 10/06/1934  Procedure Date: 07/06/2014 Referring Physician: Court Joy, MD Primary Cardiologist: Same  INDICATIONS: Chest pain in early February that lasted on and off for approximately one week and has completely resolved. Because of a prior history of moderate coronary disease, coronary angiography is being performed to exclude significant progression that would account for the patient's symptoms.  PROCEDURE: 1. Left heart catheterization; 2. Coronary angiography; 3. Left ventriculography  CONSENT:  The risks, benefits, and details of the procedure were explained in detail to the patient. Risks including death, stroke, heart attack, kidney injury, allergy, limb ischemia, bleeding and radiation injury were discussed.  The patient verbalized understanding and wanted to proceed.  Informed written consent was obtained.  PROCEDURE TECHNIQUE:  After Xylocaine anesthesia a 5 French Slender sheath was placed in the right radial artery with an angiocath and the modified Seldinger technique.  Coronary angiography was done using a 5 F JR4 and JL 3.5 cm catheter.  Left ventriculography was done using the JR 4 catheter and hand injection.   Hemostasis was achieved with a wrist band.   CONTRAST:  Total of 100 cc.  COMPLICATIONS:  None   HEMODYNAMICS:  Aortic pressure 127/71 mmHg; LV pressure 127/3 mmHg; LVEDP 7 mmHg  ANGIOGRAPHIC DATA:   The left main coronary artery is widely patent.  The left anterior descending artery is is large and wraps around the left ventricular apex. There is diffuse disease throughout the mid segment to the origin of a large widely patent first diagonal. At the margin of the second septal perforator there is an acute bend and there is segmental 50-60% stenosis within the LAD. In the beginning of the distal LAD there is segmental 50-60% narrowing. There is a new 50% lesion  in the distal portion of the LAD. No significant obstruction is felt to be present based upon angiographic appearance..  The left circumflex artery is totally occluded after the margin of the first obtuse marginal. The first obtuse marginal is totally occluded. A small second obtuse marginal is widely patent.  The right coronary artery is dominant giving for large left ventricular branches. The PDA is large and reaches the left ventricular apex. There are 30-50% eccentric stenoses noted in the proximal and mid vessel. There is segmental 40% narrowing in the RCA beyond the origin of the PDA.Marland Kitchen  LEFT VENTRICULOGRAM:  Left ventricular angiogram was done in the 30 RAO projection and revealed normal LV cavity size with mid anterolateral severe hypokinesis. Preserved overall LVEF at 60%   IMPRESSIONS:  1. Moderate coronary disease with diffuse multifocal disease in the 50-60% range throughout the mid and distal LAD. There is slight progression since the prior study. Total occlusion of the first obtuse marginal with left to left collaterals. Nonobstructive disease is noted in the right coronary. Overall, the LAD is not significantly progressed. 2. Overall normal LV function with mid to distal anterolateral wall motion abnormality likely related to occlusion of the first marginal. EF is 60%.   RECOMMENDATION:  1. It is likely the patient's chest discomfort which is not completely resolved was not ischemia related. 2. Continue the medical regimen and risk factor modification.

## 2014-07-06 NOTE — H&P (View-Only) (Signed)
Patient ID: RAYSHARD SCHIRTZINGER, male   DOB: [birth year], 80 y.o.   MRN: [id number]      SUBJECTIVE: The patient presents for routine cardiovascular follow up. In summary, he is a 80 year old male with a history of atrial fibrillation, hypertension, hyperlipidemia, coronary artery disease, and has a pacemaker for tachycardia-bradycardia syndrome. His most recent cardiac catheterization was performed in May 2012 by Dr. Percival Spanish which demonstrated tandem 50% lesions in the mid LAD. The first obtuse marginal branch was occluded but received collateral flow, and was responsible for a prior defect seen on nuclear stress testing.  He also has COPD and hypothyroidism. Lexiscan Cardiolite stress test in 10/2013 demonstrated a fixed inferolateral defect which was consistent with myocardial scar versus attenuation artifact.  PFTs demonstrated moderate airflow obstruction with moderate to severely reduced DLCO. He sees a pulmonologist in [place], Dr. Audie Box.  He has been dealing with pruritus and follows with a dermatologist. ECG performed in the office today shows atrial fibrillation, HR 78 bpm, and RBBB and LAFB.  He awoke with severe, sharp precordial chest pains yesterday morning while still lying in bed. He then had episodes of chest pain yesterday, but less severe and not lasting as long. The initial episode may have lasted a minute. It was not associated with shortness of breath. He has remained fatigued with no identifiable etiology for at least a year. He used to me much more active as per family members. Nuclear stress test in 6/15 did not reveal any ischemic territories. Coronary angiography on 09/18/10 showed mid LAD 50% stenosis with tandem 50% stenoses more distally, 30% 1st diagonal lesion, occluded OM1 with collaterals, and 25% long RCA stenosis. Neurology ordered a battery of blood tests which were unremarkable and included ESR, CRP, and other titers.  Soc: Married. Daughter [name]) moved to [place] from [place]. Daughter (Dr. Augustina Mood, dentist) lives in [place]. Son [name] also lives in [place].   Review of Systems: As per "subjective", otherwise negative.  Allergies  Allergen Reactions  . Nsaids     colitis    Current Outpatient Prescriptions  Medication Sig Dispense Refill  . Cholecalciferol (VITAMIN D) 2000 UNITS CAPS Take 1 capsule by mouth daily.    Marland Kitchen edoxaban (SAVAYSA) 60 MG TABS tablet Take 60 mg by mouth daily. 30 tablet 1  . isosorbide mononitrate (IMDUR) 30 MG 24 hr tablet Take 1 tablet (30 mg total) by mouth at bedtime. 90 tablet 3  . levothyroxine (SYNTHROID, LEVOTHROID) 50 MCG tablet Take 50 mcg by mouth daily before breakfast.     . Mesalamine (ASACOL) 400 MG CPDR DR capsule Take 400 mg by mouth. Patient takes 2 by mouth twice a day    . metoprolol (LOPRESSOR) 50 MG tablet Take 1 tablet (50 mg total) by mouth 2 (two) times daily. 60 tablet 3  . Multiple Vitamins-Minerals (CENTRUM PO) Take 1 tablet by mouth daily.     . predniSONE (DELTASONE) 10 MG tablet Take 3 tablets (30 mg total) by mouth daily with breakfast. 60 tablet 0   No current facility-administered medications for this visit.    Past Medical History  Diagnosis Date  . Hypertension   . Dyslipidemia   . Atrial fibrillation   . CAD (coronary artery disease)     a.  cath 5/12: mLAD 50% then tandem 50%, dLAD 30%; D1 30%; OM1 occluded with collateral flow; pRCA 25%; EF 55% with inf HK;  occluded OM likely cause of perfusion defect on Myoview  . Tachy-brady syndrome  s/p pacer  . Ulcerative colitis   . Dizziness   . Nonspecific abnormal unspecified cardiovascular function study   . GERD (gastroesophageal reflux disease)   . Arthritis   . Arrhythmia   . Pelvic fracture   . Hyperlipidemia   . Depression   . Anxiety   . Gout   . Dementia     short term  . Skin disorder     blister formation  . Hearing disorder   . Hypothyroidism   . COPD (chronic obstructive pulmonary disease)   . Colitis     . Pacemaker     Past Surgical History  Procedure Laterality Date  . Implantation of a medtronic dual chamber pacemaker  02/01/06    [name] [name] MD  . Left knee arthroscopy with debridement  04/23/2006    [name] M.D  . Left knee total knee  replacement  05/13/07    [name] M.D.  . Knee surgery  2005    replacement  . Back surgery  1975  . Lumbar spine surgery    . Insert / replace / remove pacemaker      MEDTRONIC....PPM Serial Number:  [id number].....Marland KitchenPPM Model Number:  [id number]....Marland KitchenPPM DOI:  02/01/2006    . Cardiac catheterization  09/20/10    EF 55%.....Dr. Percival Spanish    History   Social History  . Marital Status: Married    Spouse Name: [name]    Number of Children: 3  . Years of Education: 12   Occupational History  . retired    Social History Main Topics  . Smoking status: Former Smoker -- 1.00 packs/day for 15 years    Types: Cigarettes    Start date: 05/13/1955    Quit date: 05/11/1978  . Smokeless tobacco: Never Used  . Alcohol Use: No  . Drug Use: No  . Sexual Activity: Not on file   Other Topics Concern  . Not on file   Social History Narrative   One cup of caffeine daily     Filed Vitals:   06/19/14 0855  BP: 131/85  Pulse: 65  Height: 6' 1.5" (1.867 m)  Weight: 157 lb (71.215 kg)   BP 131/85  Pulse 65   PHYSICAL EXAM General: NAD HEENT: Normal. Neck: No JVD, no thyromegaly. Lungs: Clear to auscultation bilaterally with normal respiratory effort. CV: Nondisplaced PMI.  Irregular rhythm, normal rate, normal S1/S2, no S3, no murmur. No pretibial or periankle edema.    Abdomen: Soft, nontender, no distention.  Neurologic: Alert and oriented x 3.  Psych: Normal affect. Skin: Normal. Musculoskeletal: No gross deformities. Extremities: No clubbing or cyanosis.   ECG: Most recent ECG reviewed.      ASSESSMENT AND PLAN: 1. Chest pain and ongoing fatigue in the context of underlying CAD: Given the ongoing fatigue  and now symptoms of chest pain, I will proceed with coronary angiography to effectively rule out ischemic heart disease as a potential etiology. Prior cath results from 09/2010 noted above. I will increase Imdur to 30 mg bid and prescribe SL nitroglycerin. I had a long discussion with the patient and his wife regarding this management strategy and they are in agreement. I will hold anticoagulation for at least 48 hours beforehand. 2. Atrial fibrillation: Device interrogation on 04/11/14 demonstrated normal device function and with 212 mode switches, 2.2% burden, and one HR of 160 bpm. Metoprolol was increased by me for an ECG confirming an elevated HR. Due to insurance reasons, I will switch Savaysa to  Xarelto 20 mg daily. 3. Essential HTN: Controlled on current therapy. No changes. 4. Pacemaker: Device interrogation on 04/11/14 demonstrated normal device function and with 212 mode switches, 2.2% burden, and one HR of 160 bpm. Metoprolol was increased by me for an ECG confirming an elevated HR. Follows with Dr. Rayann Heman. 5. Hyperlipidemia: I will try and obtain the results of his most recent lipid panel. Notably he is not on statin therapy.  6. COPD: On Symbicort. Followed in [place] by Dr. Audie Box. PFT results previously reviewed.  Dispo: f/u after cath  Time spent: 40 minutes, of which >50% spent discussing all prior testing and present management strategy to assess etiology of chest pain and ongoing fatigue.  Kate Sable, M.D., F.A.C.C.

## 2014-07-09 ENCOUNTER — Ambulatory Visit (INDEPENDENT_AMBULATORY_CARE_PROVIDER_SITE_OTHER): Payer: Medicare Other | Admitting: *Deleted

## 2014-07-09 ENCOUNTER — Telehealth: Payer: Self-pay | Admitting: Cardiology

## 2014-07-09 DIAGNOSIS — I495 Sick sinus syndrome: Secondary | ICD-10-CM

## 2014-07-09 LAB — MDC_IDC_ENUM_SESS_TYPE_REMOTE
Battery Remaining Longevity: 18 mo
Battery Voltage: 2.7 V
Brady Statistic AP VP Percent: 1 %
Brady Statistic AS VP Percent: 0 %
Brady Statistic AS VS Percent: 6 %
Lead Channel Pacing Threshold Amplitude: 0.625 V
Lead Channel Pacing Threshold Amplitude: 0.75 V
Lead Channel Pacing Threshold Pulse Width: 0.4 ms
Lead Channel Setting Pacing Amplitude: 2.5 V
Lead Channel Setting Pacing Pulse Width: 0.4 ms
MDC IDC MSMT BATTERY IMPEDANCE: 2606 Ohm
MDC IDC MSMT LEADCHNL RA IMPEDANCE VALUE: 533 Ohm
MDC IDC MSMT LEADCHNL RV IMPEDANCE VALUE: 436 Ohm
MDC IDC MSMT LEADCHNL RV PACING THRESHOLD PULSEWIDTH: 0.4 ms
MDC IDC MSMT LEADCHNL RV SENSING INTR AMPL: 8 mV
MDC IDC SESS DTM: 20160229185655
MDC IDC SET LEADCHNL RA PACING AMPLITUDE: 2 V
MDC IDC SET LEADCHNL RV SENSING SENSITIVITY: 4 mV
MDC IDC STAT BRADY AP VS PERCENT: 93 %

## 2014-07-09 NOTE — Progress Notes (Signed)
Remote pacemaker transmission.   

## 2014-07-09 NOTE — Telephone Encounter (Signed)
Confirmed remote transmission w/ pt wife.   

## 2014-07-16 ENCOUNTER — Ambulatory Visit (INDEPENDENT_AMBULATORY_CARE_PROVIDER_SITE_OTHER): Payer: Medicare Other | Admitting: Adult Health

## 2014-07-16 ENCOUNTER — Encounter: Payer: Self-pay | Admitting: Adult Health

## 2014-07-16 VITALS — BP 140/80 | HR 69 | Ht 73.5 in | Wt 152.2 lb

## 2014-07-16 DIAGNOSIS — I25118 Atherosclerotic heart disease of native coronary artery with other forms of angina pectoris: Secondary | ICD-10-CM | POA: Diagnosis not present

## 2014-07-16 DIAGNOSIS — R0989 Other specified symptoms and signs involving the circulatory and respiratory systems: Secondary | ICD-10-CM

## 2014-07-16 NOTE — Progress Notes (Signed)
Cardiology Office Note   Date:  07/16/2014   ID:  [name], DOB 1935-02-17, MRN [id number]  PCP:  Tivis Ringer, Frank  Cardiologist:  William Offer, NP   Chief Complaint  Patient presents with  . Tachycardia  . Bradycardia  . Coronary Artery Disease      History of Present Illness: [name] is a 80 y.o. male who presents for ongoing assessment and management of atrial fibrillation, hypertension, hyperlipidemia, CAD, patient has a pacemaker for tachybradycardia syndrome.  He was last seen by Dr. Bronson Frank on 06/19/2014, with complaints of sharp precordial chest pain.  It was reviewed by Dr. Bronson Frank, that he had a cardiac catheterization in May 2005 showing a 50% mid LAD stenosis and a tandem 50% stenosis more distally, 30% first diagonal lesion, and an occluded OM1 with collaterals.  It was also noted that he had 25% long, RCA stenosis.  The patient was planned for a cardiac catheterization to rule out worsening heart disease in the setting of his symptoms.  Anticoagulation was held for 48 hours beforehand.  IMPRESSIONS: 1. Moderate coronary disease with diffuse multifocal disease in the 50-60% range throughout the mid and distal LAD. There is slight progression since the prior study. Total occlusion of the first obtuse marginal with left to left collaterals. Nonobstructive disease is noted in the right coronary. Overall, the LAD is not significantly progressed. 2. Overall normal LV function with mid to distal anterolateral wall motion abnormality likely related to occlusion of the first marginal. EF is 60%. RECOMMENDATION: 1. It is likely the patient's chest discomfort which is not completely resolved was not ischemia related. 2. Continue the medical regimen and risk factor modification  With some complaints of recurrent chest pain but has not has anything that was severe. His main complaint is memory issues. He states that he sometimes forgets where he is, or  how to get home if he is downtown. He has been seen by neurologist in the past with no definite diagnosis.   Past Medical History  Diagnosis Date  . Hypertension   . Dyslipidemia   . Atrial fibrillation   . CAD (coronary artery disease)     a.  cath 5/12: mLAD 50% then tandem 50%, dLAD 30%; D1 30%; OM1 occluded with collateral flow; pRCA 25%; EF 55% with inf HK;  occluded OM likely cause of perfusion defect on Myoview  . Tachy-brady syndrome     s/p pacer  . Ulcerative colitis   . Dizziness   . Nonspecific abnormal unspecified cardiovascular function study   . GERD (gastroesophageal reflux disease)   . Arthritis   . Arrhythmia   . Pelvic fracture   . Hyperlipidemia   . Depression   . Anxiety   . Gout   . Dementia     short term  . Skin disorder     blister formation  . Hearing disorder   . Hypothyroidism   . COPD (chronic obstructive pulmonary disease)   . Colitis   . Pacemaker     Past Surgical History  Procedure Laterality Date  . Implantation of a medtronic dual chamber pacemaker  02/01/06    [name] [name]  . Left knee arthroscopy with debridement  04/23/2006    [name]. Norris M.D  . Left knee total knee  replacement  05/13/07    [name]. Norris M.D.  . Knee surgery  2005    replacement  . Back surgery  1975  . Lumbar spine  surgery    . Insert / replace / remove pacemaker      MEDTRONIC....PPM Serial Number:  [id number].....William KitchenPPM Model Number:  [id number]....William KitchenPPM DOI:  02/01/2006    . Cardiac catheterization  09/20/10    EF 55%.....Dr. Percival Frank  . Left heart catheterization with coronary angiogram N/A 07/06/2014    Procedure: LEFT HEART CATHETERIZATION WITH CORONARY ANGIOGRAM;  Surgeon: William Grooms, Frank;  Location[facility] CATH LAB;  Service: Cardiovascular;  Laterality: N/A;     Current Outpatient Prescriptions  Medication Sig Dispense Refill  . Cholecalciferol (VITAMIN D) 2000 UNITS CAPS Take 1 capsule by mouth daily.    . isosorbide mononitrate (IMDUR)  30 MG 24 hr tablet Take 1 tablet (30 mg total) by mouth 2 (two) times daily. 180 tablet 3  . levothyroxine (SYNTHROID, LEVOTHROID) 50 MCG tablet Take 50 mcg by mouth daily before breakfast.     . Mesalamine (ASACOL) 400 MG CPDR DR capsule Take 400 mg by mouth. Patient takes 2 by mouth twice a day    . metoprolol (LOPRESSOR) 50 MG tablet Take 1 tablet (50 mg total) by mouth 2 (two) times daily. 60 tablet 3  . nitroGLYCERIN (NITROSTAT) 0.4 MG SL tablet Place 1 tablet (0.4 mg total) under the tongue every 5 (five) minutes as needed for chest pain. 25 tablet 3  . predniSONE (DELTASONE) 10 MG tablet Take 3 tablets (30 mg total) by mouth daily with breakfast. 60 tablet 0  . rivaroxaban (XARELTO) 20 MG TABS tablet Take 1 tablet (20 mg total) by mouth daily with supper. 20 tablet 0   No current facility-administered medications for this visit.    Allergies:   Nsaids    Social History:  The patient  reports that he quit smoking about 36 years ago. His smoking use included Cigarettes. He started smoking about 59 years ago. He has a 15 pack-year smoking history. He has never used smokeless tobacco. He reports that he does not drink alcohol or use illicit drugs.   Family History:  The patient's family history includes Asthma in his sister; COPD in his father and sister; Cancer in his mother and sister; Colitis in his mother; Depression in his mother; Emphysema in his father; Glaucoma in his sister; Healthy in his daughter, daughter, and son; Hypertension in his father, mother, and sister; Kidney disease in his mother; Liver cancer in his mother; Lung disease in his father; Peptic Ulcer Disease in his father; Pernicious anemia in his mother.    ROS: .   All other systems are reviewed and negative.Unless otherwise mentioned in H&P above.   PHYSICAL EXAM: VS:  BP 140/80 mmHg  Pulse 69  Ht 6' 1.5" (1.867 m)  Wt 152 lb 3.2 oz (69.037 kg)  BMI 19.81 kg/m2  SpO2 97% , BMI Body mass index is 19.81  kg/(m^2). GEN: Well nourished, well developed, in no acute distress HEENT: normal Neck: no JVD, carotid bruits, or masses Cardiac: RRR; 1/6 systolic murmurs, rubs, or gallops,no edema  Respiratory:  clear to auscultation bilaterally, normal work of breathing GI: soft, nontender, nondistended, + BS MS: no deformity or atrophy Skin: warm and dry, no rash Neuro:  Strength and sensation are intact Psych: euthymic mood, full affect   Recent Labs: 08/09/2013: ALT 22 07/06/2014: BUN 16; Creatinine 1.08; Hemoglobin 13.9; Platelets 179; Potassium 4.1; Sodium 138    Lipid Panel No results found for: CHOL, TRIG, HDL, CHOLHDL, VLDL, LDLCALC, LDLDIRECT    Wt Readings from Last 3 Encounters:  07/16/14 152  lb 3.2 oz (69.037 kg)  06/19/14 157 lb (71.215 kg)  06/13/14 158 lb 6.4 oz (71.85 kg)      Other studies Reviewed: Additional studies/ records that were reviewed today include: cardiac cath    ASSESSMENT AND PLAN:  1.  CAD: Non-obstructive per above cardiac cath. He will continue medical management. He is allergic to ASA, but would not need to be on this as he is also on NOAC. He will continue to see Dr. Bronson Frank in [place]  2. Complaints of memory loss: I will check carotid studies to evaluate for stenosis associated with these symptoms. Recommend second opinion for memory impairment, RE: Alzheimers vs other forms of dementia.   3. Hypertension: Good control currently. No changes in his medications at this time.   4. Atrial fib: Heart rate is well controlled currently. Stay on NOAC   Current medicines are reviewed at length with the patient today.  Labs/ tests ordered today include: Carotid study No orders of the defined types were placed in this encounter.     Disposition:   FU with 6 months.   Signed, Jory Sims, NP  07/16/2014 1:34 PM    [facility] [place] [facility] [address] [place], [place] [address] Phone: [phone]; Fax: [phone]

## 2014-07-16 NOTE — Progress Notes (Deleted)
Name: [name]    DOB: 1935-02-19  Age: 80 y.o.  MR#: [id number]       PCP:  Tivis Ringer, MD      Insurance: Payor: MEDICARE / Plan: MEDICARE PART A AND B / Product Type: *No Product type* /   CC:    Chief Complaint  Patient presents with  . Tachycardia  . Bradycardia  . Coronary Artery Disease    VS Filed Vitals:   07/16/14 1311  BP: 140/80  Pulse: 69  Height: 6' 1.5" (1.867 m)  Weight: 152 lb 3.2 oz (69.037 kg)  SpO2: 97%    Weights Current Weight  07/16/14 152 lb 3.2 oz (69.037 kg)  06/19/14 157 lb (71.215 kg)  06/13/14 158 lb 6.4 oz (71.85 kg)    Blood Pressure  BP Readings from Last 3 Encounters:  07/16/14 140/80  06/19/14 131/85  06/13/14 132/78     Admit date:  (Not on file) Last encounter with RMR:  Visit date not found   Allergy Nsaids  Current Outpatient Prescriptions  Medication Sig Dispense Refill  . Cholecalciferol (VITAMIN D) 2000 UNITS CAPS Take 1 capsule by mouth daily.    . isosorbide mononitrate (IMDUR) 30 MG 24 hr tablet Take 1 tablet (30 mg total) by mouth 2 (two) times daily. 180 tablet 3  . levothyroxine (SYNTHROID, LEVOTHROID) 50 MCG tablet Take 50 mcg by mouth daily before breakfast.     . Mesalamine (ASACOL) 400 MG CPDR DR capsule Take 400 mg by mouth. Patient takes 2 by mouth twice a day    . metoprolol (LOPRESSOR) 50 MG tablet Take 1 tablet (50 mg total) by mouth 2 (two) times daily. 60 tablet 3  . nitroGLYCERIN (NITROSTAT) 0.4 MG SL tablet Place 1 tablet (0.4 mg total) under the tongue every 5 (five) minutes as needed for chest pain. 25 tablet 3  . predniSONE (DELTASONE) 10 MG tablet Take 3 tablets (30 mg total) by mouth daily with breakfast. 60 tablet 0  . rivaroxaban (XARELTO) 20 MG TABS tablet Take 1 tablet (20 mg total) by mouth daily with supper. 20 tablet 0   No current facility-administered medications for this visit.    Discontinued Meds:    Medications Discontinued During This Encounter  Medication Reason  .  Multiple Vitamins-Minerals (CENTRUM PO) Error    Patient Active Problem List   Diagnosis Date Noted  . Chest pain 07/06/2014  . Coronary artery disease involving native coronary artery of native heart with other form of angina pectoris   . Anxiety 01/19/2012  . Skin rash 06/08/2011  . Fatigue 12/02/2010  . Coronary atherosclerosis of native coronary artery 10/01/2010  . ARTHRITIS, RIGHT KNEE 09/30/2010  . Radicular leg pain 09/30/2010  . Abnormal cardiovascular function study 09/09/2010  . OTHER MONONEURITIS OF LOWER LIMB 07/16/2010  . UNSPECIFIED MONONEURITIS OF LOWER LIMB 06/05/2010  . ARTHRITIS, RIGHT KNEE 06/05/2010  . LEG PAIN 06/05/2010  . Hyperlipidemia 11/19/2008  . HTN (hypertension) 11/19/2008  . Atrial fibrillation 11/19/2008  . BRADYCARDIA-TACHYCARDIA SYNDROME 11/19/2008  . ULCERATIVE COLITIS 11/19/2008  . DIZZINESS 11/19/2008  . PPM-Medtronic 11/19/2008    LABS    Component Value Date/Time   NA 138 07/06/2014 0741   NA 140 04/03/2014 1157   NA 139 07/22/2012 1455   K 4.1 07/06/2014 0741   K 5.0 04/03/2014 1157   K 4.8 07/22/2012 1455   CL 104 07/06/2014 0741   CL 106 04/03/2014 1157   CL 101 07/22/2012 1455   CO2  24 07/06/2014 0741   CO2 26 04/03/2014 1157   CO2 30 07/22/2012 1455   GLUCOSE 87 07/06/2014 0741   GLUCOSE 80 04/03/2014 1157   GLUCOSE 116* 07/22/2012 1455   BUN 16 07/06/2014 0741   BUN 20 04/03/2014 1157   BUN 15 07/22/2012 1455   CREATININE 1.08 07/06/2014 0741   CREATININE 1.21 04/03/2014 1157   CREATININE 1.20 07/22/2012 1455   CREATININE 1.2 12/02/2010 1645   CALCIUM 9.0 07/06/2014 0741   CALCIUM 9.6 04/03/2014 1157   CALCIUM 9.2 07/22/2012 1455   GFRNONAA 64* 07/06/2014 0741   GFRNONAA 57* 07/22/2012 1455   GFRNONAA >60 05/16/2007 0555   GFRAA 74* 07/06/2014 0741   GFRAA 66* 07/22/2012 1455   GFRAA  05/16/2007 0555    >60        The eGFR has been calculated using the MDRD equation. This calculation has not been validated  in all clinical situations. eGFR's persistently <60 mL/min signify possible Chronic Kidney Disease.   CMP     Component Value Date/Time   NA 138 07/06/2014 0741   K 4.1 07/06/2014 0741   CL 104 07/06/2014 0741   CO2 24 07/06/2014 0741   GLUCOSE 87 07/06/2014 0741   BUN 16 07/06/2014 0741   CREATININE 1.08 07/06/2014 0741   CREATININE 1.21 04/03/2014 1157   CALCIUM 9.0 07/06/2014 0741   PROT 7.1 06/08/2014 1045   PROT 6.7 08/09/2013 0905   ALBUMIN 4.2 08/09/2013 0905   AST 22 08/09/2013 0905   ALT 22 08/09/2013 0905   ALKPHOS 62 08/09/2013 0905   BILITOT 0.7 08/09/2013 0905   GFRNONAA 64* 07/06/2014 0741   GFRAA 74* 07/06/2014 0741       Component Value Date/Time   WBC 8.0 07/06/2014 0741   WBC 8.0 04/03/2014 1157   WBC 9.0 08/09/2013 0908   HGB 13.9 07/06/2014 0741   HGB 13.2 04/03/2014 1157   HGB 14.5 08/09/2013 0908   HCT 40.2 07/06/2014 0741   HCT 39.9 04/03/2014 1157   HCT 42.3 08/09/2013 0908   MCV 88.5 07/06/2014 0741   MCV 88.3 04/03/2014 1157   MCV 89.1 08/09/2013 0908    Lipid Panel  No results found for: CHOL, TRIG, HDL, CHOLHDL, VLDL, LDLCALC, LDLDIRECT  ABG No results found for: PHART, PCO2ART, PO2ART, HCO3, TCO2, ACIDBASEDEF, O2SAT   Lab Results  Component Value Date   TSH 1.36 12/02/2010   BNP (last 3 results) No results for input(s): BNP in the last 8760 hours.  ProBNP (last 3 results) No results for input(s): PROBNP in the last 8760 hours.  Cardiac Panel (last 3 results) No results for input(s): CKTOTAL, CKMB, TROPONINI, RELINDX in the last 72 hours.  Iron/TIBC/Ferritin/ %Sat No results found for: IRON, TIBC, FERRITIN, IRONPCTSAT   EKG Orders placed or performed in visit on 06/19/14  . EKG 12-Lead     Prior Assessment and Plan Problem List as of 07/16/2014      Cardiovascular and Mediastinum   HTN (hypertension)   Last Assessment & Plan 01/13/2012 Office Visit Written 01/13/2012  9:43 AM by Evans Lance, MD    His blood pressure  is fairly well controlled. He is instructed to maintain a low-sodium diet. He did not tolerate metoprolol. He'll continue atenolol 100 mg twice a day.      Atrial fibrillation   Last Assessment & Plan 04/19/2012 Office Visit Written 04/19/2012  4:00 PM by Evans Lance, MD    I discussed the treatment options with the patient  and his wife. Continuing his current antiarrhythmic drug therapy would not be warranted as his atrial fibrillation burden has not been reduced significantly. One option for therapy would be inpatient admission for dofetilide. Another option would be initiation of amiodarone. The patient is not inclined to be admitted to the hospital. I am a bit concerned about the degree of his COPD. Prior to starting amiodarone, I would like to obtain his most recent pulmonary function test from Dr. Farris Has in [place]. Finally, he'll continue his current anticoagulation regimen.      BRADYCARDIA-TACHYCARDIA SYNDROME   Last Assessment & Plan 04/19/2012 Office Visit Written 04/19/2012  4:00 PM by Evans Lance, MD    His ventricular rate appears to be fairly well-controlled. He will continue his current medical therapy.      Coronary atherosclerosis of native coronary artery   Last Assessment & Plan 12/02/2010 Office Visit Written 12/02/2010  4:30 PM by Liliane Shi, PA    No angina.  Continue aspirin.      Coronary artery disease involving native coronary artery of native heart with other form of angina pectoris     Digestive   ULCERATIVE COLITIS     Nervous and Auditory   UNSPECIFIED MONONEURITIS OF LOWER LIMB   OTHER MONONEURITIS OF LOWER LIMB     Musculoskeletal and Integument   ARTHRITIS, RIGHT KNEE   ARTHRITIS, RIGHT KNEE   Skin rash     Other   Hyperlipidemia   Last Assessment & Plan 12/02/2010 Office Visit Written 12/02/2010  4:31 PM by Liliane Shi, PA    Hold simvastatin for now as noted.  Check a CPK.      DIZZINESS   Last Assessment & Plan 11/11/2010  Office Visit Written 11/12/2010  3:29 PM by Evans Lance, MD    The etiology of his symptoms is unclear but he is on fairly high dose of beta blocker and I have asked him to reduce his dose to see if his symptoms improve.      PPM-Medtronic   Last Assessment & Plan 04/19/2012 Office Visit Written 04/19/2012  4:01 PM by Evans Lance, MD    His permanent dual-chamber pacemaker, Medtronic, is working normally. We'll plan to recheck in several months.      LEG PAIN   Abnormal cardiovascular function study   Last Assessment & Plan 12/02/2010 Office Visit Written 12/02/2010  4:30 PM by Liliane Shi, PA    I reviewed his stress test with Dr. [name].  The abnormal findings are consistent with his known occluded obtuse marginal-one.  No further testing is warranted.      Radicular leg pain   Fatigue   Last Assessment & Plan 12/02/2010 Office Visit Written 12/02/2010  4:29 PM by Liliane Shi, PA    Etiology unclear.  I reviewed his case today with Dr. [name].  We have decided to hold his simvastatin for now to see if this helps.  We have also decided to decrease his atenolol more to 50 mg once a day.  If his heart rate becomes uncontrolled, we can certainly add digoxin to his medical regimen.  It has been several months since he has had a TSH her CBC.  Check a CBC, basic metabolic panel, LFTs, total CPK, sedimentation rate and TSH today.  Followup with Dr. [name] in September as scheduled.      Anxiety   Chest pain       Imaging: No results found.

## 2014-07-16 NOTE — Patient Instructions (Signed)
Your physician wants you to follow-up in: 6 months in [place] with Dr Virgina Jock will receive a reminder letter in the mail two months in advance. If you don't receive a letter, please call our office to schedule the follow-up appointment.   Your physician recommends that you continue on your current medications as directed. Please refer to the Current Medication list given to you today.   Your physician has requested that you have a carotid duplex. This test is an ultrasound of the carotid arteries in your neck. It looks at blood flow through these arteries that supply the brain with blood. Allow one hour for this exam. There are no restrictions or special instructions.     Thank you for choosing [facility] !

## 2014-07-17 ENCOUNTER — Other Ambulatory Visit: Payer: Self-pay | Admitting: *Deleted

## 2014-07-17 MED ORDER — RIVAROXABAN 20 MG PO TABS: 20.0000 mg | ORAL_TABLET | Freq: Every day | ORAL | Status: DC

## 2014-07-19 ENCOUNTER — Encounter: Payer: Self-pay | Admitting: *Deleted

## 2014-07-25 ENCOUNTER — Encounter: Payer: Self-pay | Admitting: Internal Medicine

## 2014-07-26 ENCOUNTER — Encounter (INDEPENDENT_AMBULATORY_CARE_PROVIDER_SITE_OTHER): Payer: Medicare Other

## 2014-07-26 DIAGNOSIS — R0989 Other specified symptoms and signs involving the circulatory and respiratory systems: Secondary | ICD-10-CM | POA: Diagnosis not present

## 2014-08-06 ENCOUNTER — Telehealth (INDEPENDENT_AMBULATORY_CARE_PROVIDER_SITE_OTHER): Payer: Self-pay | Admitting: *Deleted

## 2014-08-06 NOTE — Telephone Encounter (Signed)
[name] is needing his Delzicol refilled. Would like for a 90 day supply sent to the mail order pharmacy. Her return phone number is [phone].

## 2014-08-07 ENCOUNTER — Ambulatory Visit (INDEPENDENT_AMBULATORY_CARE_PROVIDER_SITE_OTHER): Payer: Medicare Other | Admitting: Pulmonary Disease

## 2014-08-07 ENCOUNTER — Encounter: Payer: Self-pay | Admitting: Pulmonary Disease

## 2014-08-07 VITALS — BP 112/70 | HR 68 | Temp 97.0°F | Ht 74.5 in | Wt 155.4 lb

## 2014-08-07 DIAGNOSIS — J438 Other emphysema: Secondary | ICD-10-CM

## 2014-08-07 DIAGNOSIS — Z77090 Contact with and (suspected) exposure to asbestos: Secondary | ICD-10-CM | POA: Insufficient documentation

## 2014-08-07 DIAGNOSIS — J449 Chronic obstructive pulmonary disease, unspecified: Secondary | ICD-10-CM | POA: Insufficient documentation

## 2014-08-07 NOTE — Assessment & Plan Note (Signed)
The patient has a significant history for asbestos exposure, but does not have any evidence for asbestos related lung disease on a recent CT chest. I would recommend that he have a yearly chest x-ray for evaluation given his exposure and history of smoking.

## 2014-08-07 NOTE — Progress Notes (Signed)
   Subjective:    Patient ID: [name], male    DOB: [birth year], 80 y.o.   MRN: [id number]  HPI The patient is a 80 year old male who I've been asked to see for management of COPD. He has a history of smoking, but has not done so since 1985. He had pulmonary function studies in June of last year that showed moderate to severe airflow obstruction, but no restriction. His diffusion capacity was severely reduced. The patient has been tried on different types of inhalers in the past, and never has seen any change in his breathing. He has never had hospitalization or an ER visit for his breathing, and does not think he has ever had a true acute exacerbation.  He currently describes a 4-5 block dyspnea on exertion at a moderate pace on flat ground. He will also get winded walking up one flight of stairs, but has no issues bringing groceries in from the car. He does note variability in his breathing from week to week. He denies any significant cough or mucus production, and does not have issues with lower extremity edema. He does have a history of coronary disease, and had a catheterization in February of this year with moderate coronary disease. He also has a history of atrial fibrillation, as well as a pacemaker for tachybradycardia syndrome. He has had home sleep testing which did not show significant sleep apnea, but he did have 79 minutes with a saturation less than 88%. Finally, the patient has a history of asbestos exposure when he worked for [employer] in [place]. This was for approximately 20 years. He had a chest CT last year by a pulmonologist in [place], and he tells me that he has never been diagnosed with asbestosis or pleural plaques.   Review of Systems  Constitutional: Positive for appetite change and unexpected weight change. Negative for fever.  HENT: Negative for congestion, dental problem, ear pain, nosebleeds, postnasal drip, rhinorrhea, sinus pressure, sneezing, sore  throat and trouble swallowing.   Eyes: Negative for redness and itching.  Respiratory: Positive for cough and shortness of breath. Negative for chest tightness and wheezing.   Cardiovascular: Positive for chest pain. Negative for palpitations and leg swelling.  Gastrointestinal: Negative for nausea and vomiting.  Genitourinary: Negative for dysuria.  Musculoskeletal: Positive for arthralgias. Negative for joint swelling.  Skin: Negative for rash.  Neurological: Negative for headaches.  Hematological: Does not bruise/bleed easily.  Psychiatric/Behavioral: Positive for dysphoric mood. The patient is nervous/anxious.        Objective:   Physical Exam Constitutional:  Well developed, no acute distress  HENT:  Nares patent without discharge  Oropharynx without exudate, palate and uvula are normal  Eyes:  Perrla, eomi, no scleral icterus  Neck:  No JVD, no TMG  Cardiovascular:  Normal rate, regular rhythm, no rubs or gallops.  No murmurs        Intact distal pulses  Pulmonary :  Mildly decreased breath sounds, no stridor or respiratory distress   No rales, rhonchi, or wheezing  Abdominal:  Soft, nondistended, bowel sounds present.  No tenderness noted.   Musculoskeletal:  No lower extremity edema noted.  Lymph Nodes:  No cervical lymphadenopathy noted  Skin:  No cyanosis noted  Neurologic:  Alert, appropriate, moves all 4 extremities without obvious deficit.          Assessment & Plan:

## 2014-08-07 NOTE — Patient Instructions (Signed)
Will try stiolto, 2 inhalations each am everyday whether you need or not. proair respiclick for emergencies.  Can take 2 puffs every 6 hrs if needed for rescue. Will refer to pulmonary rehab at [facility] Will recheck your oxygen level overnight at next visit once you have been on medications for 4 weeks. followup with me again in 4 weeks.

## 2014-08-07 NOTE — Assessment & Plan Note (Signed)
The patient has moderate to severe airflow obstruction on his PFTs from last year, and tells me that he is been tried on multiple bronchodilators without a significant change in his breathing. He is not aware if he has ever been on Spiriva in the past. He tells me that he has never had an acute exacerbation to his knowledge, and therefore probably does not require inhaled corticosteroids. I will try him on a LABA/LAMA and see how he responds. He will also need to keep a rescue inhaler available. I have reviewed the pathophysiology of COPD and emphysema with him, and how he would benefit greatly from a conditioning program to improve quality of life. A lot the symptoms that he is describing sound more related to fatigue and deconditioning than anything else. I have recommended that he attend pulmonary rehabilitation program at the hospital, and he lives closest to [place]. He will also need to have overnight oximetry done again after being on treatment to see if he does require nocturnal oxygen.

## 2014-08-08 ENCOUNTER — Telehealth (INDEPENDENT_AMBULATORY_CARE_PROVIDER_SITE_OTHER): Payer: Self-pay | Admitting: *Deleted

## 2014-08-08 NOTE — Telephone Encounter (Signed)
Dr.[name] the patient is asking that we send a prescription to his mail order for Delzicol 400 mg - He is taking only 4 per day. Two in the am , and two in the pm. He is requesting a 90 day supply,2 refills.

## 2014-08-11 ENCOUNTER — Other Ambulatory Visit (INDEPENDENT_AMBULATORY_CARE_PROVIDER_SITE_OTHER): Payer: Self-pay | Admitting: Internal Medicine

## 2014-08-11 MED ORDER — MESALAMINE 400 MG PO CPDR: 800.0000 mg | DELAYED_RELEASE_CAPSULE | Freq: Two times a day (BID) | ORAL | Status: DC

## 2014-08-11 NOTE — Telephone Encounter (Signed)
Please let patient know his prescription sent for 3 months with 3 refills.

## 2014-08-13 NOTE — Telephone Encounter (Signed)
Patient was called and a message was left on voicemail , making patient aware that Rx had been sent in to mail order pharmacy.

## 2014-08-14 ENCOUNTER — Encounter (HOSPITAL_COMMUNITY): Payer: Self-pay

## 2014-08-14 ENCOUNTER — Encounter (HOSPITAL_COMMUNITY)
Admission: RE | Admit: 2014-08-14 | Discharge: 2014-08-14 | Disposition: A | Discharge status: Home / Self Care | Payer: Medicare Other | Source: Ambulatory Visit | Attending: Pulmonary Disease | Admitting: Pulmonary Disease

## 2014-08-14 VITALS — BP 108/66 | HR 87 | Ht 74.0 in | Wt 157.6 lb

## 2014-08-14 DIAGNOSIS — J438 Other emphysema: Secondary | ICD-10-CM | POA: Diagnosis not present

## 2014-08-14 NOTE — Progress Notes (Signed)
Cardiac/Pulmonary Rehab Medication Review by a Pharmacist  Does the patient  feel that his/her medications are working for him/her?  yes  Has the patient been experiencing any side effects to the medications prescribed?  yes  Does the patient measure his/her own blood pressure or blood glucose at home?  yes   Does the patient have any problems obtaining medications due to transportation or finances?   no  Understanding of regimen: excellent Understanding of indications: excellent Potential of compliance: excellent  Questions asked to Determine Patient Understanding of Medication Regimen:  1. What is the name of the medication?  2. What is the medication used for?  3. When should it be taken?  4. How much should be taken?  5. How will you take it?  6. What side effects should you report?  Understanding Defined as: Excellent: All questions above are correct Good: Questions 1-4 are correct Fair: Questions 1-2 are correct  Poor: 1 or none of the above questions are correct   Pharmacist comments: Pt has adjusted the Prednisone due to complaints of "jumpy".  Pt reduced to 51m daily. Pt recently starting using Stoilto inhaler per Dr CGwenette Greet  No other issues identified.     HHart RobinsonsA 08/14/2014 3:22 PM

## 2014-08-14 NOTE — Progress Notes (Signed)
Patient arrived at 1430. Patient oriented to staff.  Patient instructed on exercise and equipment safety. Patient referred to Cardiac Rehab by Dr. [name] due to Emphysema (J43.8)  Dr. [name] is heis Pulmonologist and Wandalee Ferdinand is his PCP.  During orientation advised patient on arrival and appointment times what to wear, what to do before, during and after exercise.  Patient educated on pursed lip breathing.  Reviewed attendance and class policy.  Talked about inclement weather and class consultation policy. Patient is scheduled to start cardiac Rehab on August 21, 2014 at 1045 am.   Patient was advised to come to class 5 minutes before class starts.  He was also given instructions on meeting with the dietician and attending the Family Structure classes. Pt is eager to get started. Patient able to finish walk test with out assistance.  Explained that there will be stretching before and after exercise. Explained that he would exercise at 2 stations within his scheduled time.  Orientation ended at 1615.

## 2014-08-14 NOTE — Patient Instructions (Signed)
Pt has finished orientation and is scheduled to start CR on August 21, 2014 at 1045 am. Pt has been instructed to arrive to class 15 minutes early for scheduled class. Pt has been instructed to wear comfortable clothing and shoes with rubber soles. Pt has been told to take their medications 1 hour prior to coming to class.  If the patient is not going to attend class, he has been instructed to call.

## 2014-08-17 ENCOUNTER — Ambulatory Visit (INDEPENDENT_AMBULATORY_CARE_PROVIDER_SITE_OTHER): Payer: Medicare Other | Admitting: Neurology

## 2014-08-17 ENCOUNTER — Encounter: Payer: Self-pay | Admitting: Neurology

## 2014-08-17 VITALS — BP 119/76 | HR 68 | Temp 98.3°F | Resp 16 | Ht 74.0 in | Wt 160.0 lb

## 2014-08-17 DIAGNOSIS — G3184 Mild cognitive impairment, so stated: Secondary | ICD-10-CM

## 2014-08-17 DIAGNOSIS — R269 Unspecified abnormalities of gait and mobility: Secondary | ICD-10-CM | POA: Diagnosis not present

## 2014-08-17 DIAGNOSIS — I25118 Atherosclerotic heart disease of native coronary artery with other forms of angina pectoris: Secondary | ICD-10-CM | POA: Diagnosis not present

## 2014-08-17 DIAGNOSIS — Z950 Presence of cardiac pacemaker: Secondary | ICD-10-CM

## 2014-08-17 NOTE — Patient Instructions (Signed)
I think overall you are doing fairly well but I do want to suggest a few things today:  Remember to drink plenty of fluid, eat healthy meals and do not skip any meals. Try to eat protein with a every meal and eat a healthy snack such as fruit or nuts in between meals. Try to keep a regular sleep-wake schedule and try to exercise daily, particularly in the form of walking, 20-30 minutes a day, if you can. Good nutrition, proper sleep and exercise can help her cognitive function.  Engage in social activities in your community and with your family and try to keep up with current events by reading the newspaper or watching the news. If you have computer and can go online, try [email or web address]. Also, you may like to do word finding puzzles or crossword puzzles.  As far as your medications are concerned, I would like to suggest: We will hold off on new medications.     As far as diagnostic testing: we will inquire if you can have an MRI brain. Otherwise I will order a CT head and compare findings with 3/14.   I would like to see you back in 3 months, sooner if we need to. Please call us with any interim questions, concerns, problems, updates or refill requests.  Please also call us for any test results so we can go over those with you on the phone. Richardson Landry is my clinical assistant and will answer any of your questions and relay your messages to me and also relay most of my messages to you.  Our phone number is [phone]. We also have an after hours call service for urgent matters and there is a physician on-call for urgent questions. For any emergencies you know to call 911 or go to the nearest emergency room.

## 2014-08-17 NOTE — Progress Notes (Signed)
Subjective:    Patient ID: [name] is a 80 y.o. male.  HPI     Interim history:   [name] is a 80 year old right-handed gentleman with a complex underlying medical history of paroxysmal A. fib , coronary artery disease (followed by Dr. Bronson Ing), status post pacemaker placement in 2007 secondary to tachycardia-bradycardia syndrome, COPD, arthritis, status post left total knee replacement in 2008, degenerative back disease, status post lumbar spine surgery in the 70s, hypothyroidism, hearing loss, anxiety, depression, gout, hypertension, hyperlipidemia, ulcerative colitis (followed by Dr. Laural Golden), who presents for follow-up consultation of his over one year history of memory loss. The patient is accompanied by his wife today. I first met him on 06/08/2014 at the request of his primary care physician, at which time he reported a 1+ year history of problems particularly with his short-term memory, with forgetfulness being his most pressing concern. I ordered some blood work. ANA, RPR, rheumatoid factor, CRP, vitamin B1, and Lyme titer were fine and we called him with the test results. We also called him with his pulse oximetry test results from recently and they were reassured that he did not need supplemental oxygen.   Today, 08/17/2014: he reports more issues with forgetfulness and confusion. His wife provides additional information and states that he has needed some instructions and will help with directions. He still drives but she has had to tell him sometimes even on familiar routes. He seems to be forgetful primarily. Remote memory is good. He has not had any recent falls. He may not be drinking enough water. He tries to stay active. He is physically active according to his wife. He is now referred to pulmonary rehabilitation and just recently saw his pulmonologist, Dr. [name]. He has been started on a new inhaler. He has had some grayish looking thicker phlegm recently. He has had no  new cough or fevers or chills. He has had no medication changes. He denies frank depression. We talked about his blood test results today. His pulmonologist is planning to recheck his pulse oximetry test.  Previously:   He has problems with short-term memory particularly with forgetfulness but there is no report of confusion, delusions, or hallucinations. He was recently placed on Aricept but this was discontinued because of side effects in particular severe fatigue. He also had mood related side effects such as depressive symptoms. He has actually tried several different antidepressants which also caused side effects. He has a tendency to be quite sensitive to medications. In particular, he has had quite a bit of change in his blood pressure and heart medications. He quit smoking in the 80s but has a 30-pack-year history of smoking.  He had a CT head without contrast on 07/22/2012 after a fall: No acute intracranial abnormality.  Extensive chronic small vessel ischemic changes in the periventricular white matter.  Slight atrophy. In addition, personally reviewed the images through the PACS system.  He had to home sleep studies which did not show any significant obstructive apnea but he did have some desaturations. He also had a overnight pulse oximetry test from what I understand in late November last year but the results are not available for review today. According to his daughter, there were desaturations during sleep but not necessarily related to obstructive breathing events. Of note, he does have a history of asbestos exposure during his work time and sees a pulmonologist in [place], [place] once a year for that. 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   He reports  paresthesias in both upper extremities, also some pain. This is restricted to his fingers and toes. He feels that his toes are numb. He has some grip weakness in both hands. He also has a trigger finger on the left side in the middle finger. He had an injury to his left ring finger tip years ago, probably in the 39s. He has had chronic itching for decades. When he was on methotrexate for his ulcerative colitis is itching improved. Nevertheless, he is currently not on methotrexate and was on it on 2 different occasions.   He also sees a dermatologist.   In your office note from 03/28/2014 you mention his B12 and TSH, which have been normal, electrolytes were normal as well.  His Past Medical History Is Significant For: Past Medical History  Diagnosis Date  . Hypertension   . Dyslipidemia   . Atrial fibrillation   . CAD (coronary artery disease)     a.  cath 5/12: mLAD 50% then tandem 50%, dLAD 30%; D1 30%; OM1 occluded with collateral flow; pRCA 25%; EF 55% with inf HK;  occluded OM likely cause of perfusion defect on Myoview  . Tachy-brady syndrome     s/p pacer  . Ulcerative colitis   . Dizziness   . Nonspecific abnormal unspecified cardiovascular function study   . GERD (gastroesophageal reflux disease)   . Arthritis   . Arrhythmia   . Pelvic fracture   . Hyperlipidemia   . Depression   . Anxiety   . Gout   . Dementia     short term  . Skin disorder     blister formation  . Hearing disorder   . Hypothyroidism   . COPD (chronic obstructive pulmonary disease)   . Colitis   . Pacemaker     His Past Surgical History Is Significant For: Past Surgical History  Procedure Laterality Date  . Implantation of a medtronic dual chamber pacemaker  02/01/06    [name] [name] MD  . Left knee arthroscopy with debridement  04/23/2006    [name] M.D  . Left knee total knee  replacement  05/13/07    [name] M.D.  . Knee surgery  2005    replacement  . Back surgery   1975  . Lumbar spine surgery    . Insert / replace / remove pacemaker      MEDTRONIC....PPM Serial Number:  [id number].....Marland KitchenPPM Model Number:  [id number]....Marland KitchenPPM DOI:  02/01/2006    . Cardiac catheterization  09/20/10    EF 55%.....Dr. Percival Spanish  . Left heart catheterization with coronary angiogram N/A 07/06/2014    Procedure: LEFT HEART CATHETERIZATION WITH CORONARY ANGIOGRAM;  Surgeon: Sinclair Grooms, MD;  Location: [facility] CATH LAB;  Service: Cardiovascular;  Laterality: N/A;    His Family History Is Significant For: Family History  Problem Relation Age of Onset  . Lung disease Father     Emphysema  . Hypertension Mother   . Liver cancer Mother   . Pernicious anemia Mother   . Asthma Sister   . Healthy Daughter   . Healthy Daughter   . Healthy Son   . Hypertension Father   . Peptic Ulcer Disease Father   . COPD Father   .  Depression Mother   . Kidney disease Mother   . Colitis Mother   . COPD Sister   . Glaucoma Sister   . Hypertension Sister   . Cancer Sister     His Social History Is Significant For: History   Social History  . Marital Status: Married    Spouse Name: [name]  . Number of Children: 3  . Years of Education: 12   Occupational History  . retired    Social History Main Topics  . Smoking status: Former Smoker -- 1.00 packs/day for 15 years    Types: Cigarettes    Start date: 05/13/1955    Quit date: 05/12/1983  . Smokeless tobacco: Never Used  . Alcohol Use: No  . Drug Use: No  . Sexual Activity: Not on file   Other Topics Concern  . None   Social History Narrative   One cup of caffeine daily    His Allergies Are:  Allergies  Allergen Reactions  . Nsaids     colitis  :   His Current Medications Are:  Outpatient Encounter Prescriptions as of 08/17/2014  Medication Sig  . acetaminophen (TYLENOL) 500 MG tablet Take 500 mg by mouth every 6 (six) hours as needed for mild pain (takes as needed for arthritis pain).  . Cholecalciferol (VITAMIN  D) 2000 UNITS CAPS Take 1 capsule by mouth daily with supper.   . isosorbide mononitrate (IMDUR) 30 MG 24 hr tablet Take 1 tablet (30 mg total) by mouth 2 (two) times daily.  Marland Kitchen levothyroxine (SYNTHROID, LEVOTHROID) 50 MCG tablet Take 50 mcg by mouth daily before breakfast.   . loratadine (CLARITIN) 10 MG tablet Take 10 mg by mouth daily.  . Mesalamine (ASACOL) 400 MG CPDR DR capsule Take 2 capsules (800 mg total) by mouth 2 (two) times daily. Patient takes 2 by mouth twice a day  . metoprolol (LOPRESSOR) 50 MG tablet Take 1 tablet (50 mg total) by mouth 2 (two) times daily.  . nitroGLYCERIN (NITROSTAT) 0.4 MG SL tablet Place 1 tablet (0.4 mg total) under the tongue every 5 (five) minutes as needed for chest pain.  . predniSONE (DELTASONE) 10 MG tablet Take 3 tablets (30 mg total) by mouth daily with breakfast. (Patient taking differently: Take 30 mg by mouth daily with breakfast. States reduced to one tablet (32m) per day because 365mcaused pt to be "too jumpy".  1051mer day seems to help.)  . rivaroxaban (XARELTO) 20 MG TABS tablet Take 1 tablet (20 mg total) by mouth daily with supper.  . Tiotropium Bromide-Olodaterol 2.5-2.5 MCG/ACT AERS Inhale 2 puffs into the lungs daily.  :  Review of Systems:  Out of a complete 14 point review of systems, all are reviewed and negative with the exception of these symptoms as listed below:   Review of Systems  Neurological:       SteOtisels like he is "forgetting things a lot, getting confused at times".     Objective:  Neurologic Exam  Physical Exam Physical Examination:   Filed Vitals:   08/17/14 1114  BP: 119/76  Pulse: 68  Temp: 98.3 F (36.8 C)  Resp: 16    General Examination: The patient is a very pleasant 80 66o. male in no acute distress. He is calm and cooperative with the exam. He denies Auditory Hallucinations and Visual Hallucinations. He is well groomed and situated in a chair  HEENT: Normocephalic, atraumatic, pupils are  equal, round and reactive to light and accommodation. Funduscopic  exam is normal with sharp disc margins noted. Extraocular tracking shows mild saccadic breakdown without nystagmus noted. Hearing is intact. Face is symmetric with no facial masking and normal facial sensation. There is no lip, neck or jaw tremor. Neck is not rigid with intact passive ROM. There are no carotid bruits on auscultation. Oropharynx exam reveals mild mouth dryness. No significant airway crowding is noted. Mallampati is class II. Tongue protrudes centrally and palate elevates symmetrically.    Chest: is clear to auscultation without wheezing, rhonchi or crackles noted.  Heart: sounds are regular and normal without murmurs, rubs or gallops noted.   Abdomen: is soft, non-tender and non-distended with normal bowel sounds appreciated on auscultation.  Extremities: There is no pitting edema in the distal lower extremities bilaterally. Pedal pulses are intact but faint and feet are cold and slightly purplish discolored.   Skin: is warm and dry otherwise with no trophic changes noted. Age-related changes are noted on the skin.   Musculoskeletal: exam reveals no obvious joint deformities, tenderness or joint swelling or erythema. Changes consistent with OA of the hands are noted bilaterally.   Neurologically:  Mental status: The patient is awake and alert, paying good  attention. He is able to partially provide the history. His family provides details. He is oriented to: person, time/date, situation, day of week and month of year. His memory, attention, language and knowledge are impaired mildly. There is no aphasia, agnosia, apraxia or anomia. There is a no significant degree of bradyphrenia. Speech is not hypophonic with no dysarthria noted. Mood is congruent and affect is normal.   On 06/08/2014: MMSE 24/30, CDT: 4/4, AFT: 13/min.    Cranial nerves are as described above under HEENT exam. In addition, shoulder shrug is normal  with equal shoulder height noted.  Motor exam: Normal bulk, and strength for age is noted, with the exception of mild decrease in grip strength bilaterally in the 4 out of 5 range. Tone is not rigid with absence of cogwheeling or bradykinesia. There is no drift or rebound. There is no tremor.  Romberg is negative. Reflexes are 1+ in the upper extremities and 1+ in the lower extremities, but absent in the ankles . Toes are downgoing bilaterally. Fine motor skills: Finger taps, hand movements, and rapid alternating patting are mildly impaired bilaterally. Foot taps and foot agility are mildly impaired bilaterally.   Cerebellar testing shows no dysmetria or intention tremor on finger to nose testing. Heel to shin is unremarkable. There is no truncal or gait ataxia.   Sensory exam is intact to light touch, pinprick, vibration, temperature sense in the upper and lower extremities, with the exception of decreased vibration sense in his feet.   Gait, station and balance: He stands up from the seated position with mild difficulty. No veering to one side is noted. No leaning to one side. Posture is Age-appropriate. Stance is narrow-based. He turns in 3 steps. Tandem walk is tried and not possible. Balance is mildly impaired.   Assessment and Plan:    In summary, KILO ESHELMAN is a very pleasant 80 year old male with a complex underlying medical history of paroxysmal A. fib , coronary artery disease (followed by Dr. Bronson Ing), status post pacemaker placement in 2007 secondary to tachycardia-bradycardia syndrome, COPD, arthritis, status post left total knee replacement in 2008, degenerative back disease, status post lumbar spine surgery in the 70s, hypothyroidism, hearing loss, anxiety, depression, gout, hypertension, hyperlipidemia, ulcerative colitis (followed by Dr. Laural Golden), who report memory loss for  at least one year findings have been in the realm of mild cognitive impairment. We will recheck memory  scores next time. We did blood work which we reviewed. He has vascular risk factors. He had a head CT in March 2014 at the time of the fall when he was in the store. We reviewed those results again today. I would like to at least proceed with another head CT for comparison. I believe Nothing and MRI is not possible with the pacemaker type he has. Nevertheless, I will double check with his cardiologist, and then order head CT. I would also like to proceed with formal neuropsychological evaluation in the form of cognitive testing with a neuropsychologist. I made a referral to Dr. Valentina Shaggy, after the patient and his wife were agreeable to pursuing this. We will hold off on any new medications until her next appointment. I would like to recheck in 3-4 months. He will be undergoing extensive pulmonary rehabilitation in the interim.   I spent 25 min in total face-to-face time with the patient, more 50% of which was spent in counseling and coordination of care, reviewing test results, reviewing medication and reviewing the diagnosis of dementia, mild cognitive impairment, vascular disease, and paresthesias, the prognosis and treatment options.

## 2014-08-21 ENCOUNTER — Encounter (HOSPITAL_COMMUNITY)
Admission: RE | Admit: 2014-08-21 | Discharge: 2014-08-21 | Disposition: A | Discharge status: Home / Self Care | Payer: Medicare Other | Source: Ambulatory Visit | Attending: Pulmonary Disease | Admitting: Pulmonary Disease

## 2014-08-21 DIAGNOSIS — J438 Other emphysema: Secondary | ICD-10-CM | POA: Diagnosis not present

## 2014-08-23 ENCOUNTER — Encounter (HOSPITAL_COMMUNITY)
Admission: RE | Admit: 2014-08-23 | Discharge: 2014-08-23 | Disposition: A | Discharge status: Home / Self Care | Payer: Medicare Other | Source: Ambulatory Visit | Attending: Pulmonary Disease | Admitting: Pulmonary Disease

## 2014-08-23 DIAGNOSIS — J438 Other emphysema: Secondary | ICD-10-CM | POA: Diagnosis not present

## 2014-08-28 ENCOUNTER — Encounter (HOSPITAL_COMMUNITY)
Admission: RE | Admit: 2014-08-28 | Discharge: 2014-08-28 | Disposition: A | Discharge status: Home / Self Care | Payer: Medicare Other | Source: Ambulatory Visit | Attending: Pulmonary Disease | Admitting: Pulmonary Disease

## 2014-08-28 DIAGNOSIS — J438 Other emphysema: Secondary | ICD-10-CM | POA: Diagnosis not present

## 2014-08-30 ENCOUNTER — Encounter (HOSPITAL_COMMUNITY)
Admission: RE | Admit: 2014-08-30 | Discharge: 2014-08-30 | Disposition: A | Discharge status: Home / Self Care | Payer: Medicare Other | Source: Ambulatory Visit | Attending: Pulmonary Disease | Admitting: Pulmonary Disease

## 2014-08-30 DIAGNOSIS — H40053 Ocular hypertension, bilateral: Secondary | ICD-10-CM | POA: Diagnosis not present

## 2014-08-30 DIAGNOSIS — J438 Other emphysema: Secondary | ICD-10-CM | POA: Diagnosis not present

## 2014-09-01 ENCOUNTER — Other Ambulatory Visit (INDEPENDENT_AMBULATORY_CARE_PROVIDER_SITE_OTHER): Payer: Self-pay | Admitting: Internal Medicine

## 2014-09-04 ENCOUNTER — Encounter (HOSPITAL_COMMUNITY): Payer: Medicare Other

## 2014-09-04 ENCOUNTER — Ambulatory Visit (INDEPENDENT_AMBULATORY_CARE_PROVIDER_SITE_OTHER): Payer: Medicare Other | Admitting: Pulmonary Disease

## 2014-09-04 ENCOUNTER — Encounter: Payer: Self-pay | Admitting: Pulmonary Disease

## 2014-09-04 VITALS — BP 130/74 | HR 74 | Temp 97.8°F | Ht 74.0 in | Wt 160.0 lb

## 2014-09-04 DIAGNOSIS — J438 Other emphysema: Secondary | ICD-10-CM | POA: Diagnosis not present

## 2014-09-04 DIAGNOSIS — I25118 Atherosclerotic heart disease of native coronary artery with other forms of angina pectoris: Secondary | ICD-10-CM | POA: Diagnosis not present

## 2014-09-04 MED ORDER — ALBUTEROL SULFATE 108 (90 BASE) MCG/ACT IN AEPB: 2.0000 | INHALATION_SPRAY | Freq: Four times a day (QID) | RESPIRATORY_TRACT | Status: DC | PRN

## 2014-09-04 MED ORDER — TIOTROPIUM BROMIDE-OLODATEROL 2.5-2.5 MCG/ACT IN AERS: 2.0000 | INHALATION_SPRAY | Freq: Every day | RESPIRATORY_TRACT | Status: DC

## 2014-09-04 NOTE — Progress Notes (Signed)
   Subjective:    Patient ID: [name], male    DOB: 1935/01/20, 80 y.o.   MRN: [id number]  HPI Patient comes in today for follow-up of his known COPD. He was started on stiolto at the last visit, and also referred to pulmonary rehabilitation. He has been participating in the program, and also staying on his bronchodilator regimen. He feels that it has helped his breathing, but has not been overwhelming. He denies any significant cough, chest congestion, or mucus.   Review of Systems  Constitutional: Negative for fever and unexpected weight change.  HENT: Negative for congestion, dental problem, ear pain, nosebleeds, postnasal drip, rhinorrhea, sinus pressure, sneezing, sore throat and trouble swallowing.   Eyes: Negative for redness and itching.  Respiratory: Positive for cough and shortness of breath. Negative for chest tightness and wheezing.   Cardiovascular: Negative for palpitations and leg swelling.  Gastrointestinal: Negative for nausea and vomiting.  Genitourinary: Negative for dysuria.  Musculoskeletal: Negative for joint swelling.  Skin: Negative for rash.  Neurological: Negative for headaches.  Hematological: Does not bruise/bleed easily.  Psychiatric/Behavioral: Negative for dysphoric mood. The patient is not nervous/anxious.        Objective:   Physical Exam Thin male in no acute distress Nose without purulence or discharge noted Neck without lymphadenopathy or thyromegaly Chest with decreased breath sounds, no active wheezing Cardiac exam with regular rate and rhythm Lower extremities without edema, no cyanosis Alert and oriented, moves all 4 extremities.       Assessment & Plan:

## 2014-09-04 NOTE — Addendum Note (Signed)
Addended by: Inge Rise on: 09/04/2014 02:06 PM   Modules accepted: Orders

## 2014-09-04 NOTE — Patient Instructions (Signed)
Stay on stiolto, and will give you a prescription for this. Continue in pulmonary rehab, and work hard.  Also, work on exercise in between visit. Will check oxygen level overnight again, and call you with results. followup again in 57mo if doing well, but call if having breathing issues.

## 2014-09-04 NOTE — Assessment & Plan Note (Signed)
The patient feels that he is doing a little better since being on stiolto and participating in pulmonary rehabilitation. I have asked him to continue, and to try and stay as active as possible.  Will recheck his overnight oximetry since he has been on treatment.

## 2014-09-06 ENCOUNTER — Encounter (HOSPITAL_COMMUNITY): Payer: Medicare Other

## 2014-09-11 ENCOUNTER — Encounter (HOSPITAL_COMMUNITY)
Admission: RE | Admit: 2014-09-11 | Discharge: 2014-09-11 | Disposition: A | Discharge status: Home / Self Care | Payer: Medicare Other | Source: Ambulatory Visit | Attending: Pulmonary Disease | Admitting: Pulmonary Disease

## 2014-09-11 DIAGNOSIS — J439 Emphysema, unspecified: Secondary | ICD-10-CM | POA: Diagnosis not present

## 2014-09-11 DIAGNOSIS — J438 Other emphysema: Secondary | ICD-10-CM | POA: Diagnosis not present

## 2014-09-11 DIAGNOSIS — J449 Chronic obstructive pulmonary disease, unspecified: Secondary | ICD-10-CM | POA: Diagnosis not present

## 2014-09-12 NOTE — Progress Notes (Signed)
Cardiac Rehabilitation Program Outcomes Report   Orientation:  08/14/14 Graduate Date:  tbd Discharge Date:  tbd # of sessions completed: 3  Pulmonologist: Clance Family MD:  [name] Class Time:  1045  A.  Exercise Program:  Tolerates exercise @ 3.49 METS for 15 minutes and Walk Test Results:  Pre: 3.03 mets  B.  Mental Health:  Good mental attitude  C.  Education/Instruction/Skills  Accurately checks own pulse.  Rest:  68  Exercise:  91  Uses Perceived Exertion Scale and/or Dyspnea Scale  D.  Nutrition/Weight Control/Body Composition:  Adherence to prescribed nutrition program: good    E.  Blood Lipids   No results found for: CHOL, HDL, LDLCALC, LDLDIRECT, TRIG, CHOLHDL  F.  Lifestyle Changes:  Making positive lifestyle changes  G.  Symptoms noted with exercise:  Asymptomatic  Report Completed By:  Stevphen Rochester RN   Comments:  This is patients first week progress note in [facility].

## 2014-09-13 ENCOUNTER — Encounter (HOSPITAL_COMMUNITY)
Admission: RE | Admit: 2014-09-13 | Discharge: 2014-09-13 | Disposition: A | Discharge status: Home / Self Care | Payer: Medicare Other | Source: Ambulatory Visit | Attending: Pulmonary Disease | Admitting: Pulmonary Disease

## 2014-09-13 DIAGNOSIS — J438 Other emphysema: Secondary | ICD-10-CM | POA: Diagnosis not present

## 2014-09-18 ENCOUNTER — Encounter (HOSPITAL_COMMUNITY)
Admission: RE | Admit: 2014-09-18 | Discharge: 2014-09-18 | Disposition: A | Discharge status: Home / Self Care | Payer: Medicare Other | Source: Ambulatory Visit | Attending: Pulmonary Disease | Admitting: Pulmonary Disease

## 2014-09-18 DIAGNOSIS — J438 Other emphysema: Secondary | ICD-10-CM | POA: Diagnosis not present

## 2014-09-20 ENCOUNTER — Encounter (HOSPITAL_COMMUNITY)
Admission: RE | Admit: 2014-09-20 | Discharge: 2014-09-20 | Disposition: A | Discharge status: Home / Self Care | Payer: Medicare Other | Source: Ambulatory Visit | Attending: Pulmonary Disease | Admitting: Pulmonary Disease

## 2014-09-20 DIAGNOSIS — J438 Other emphysema: Secondary | ICD-10-CM | POA: Diagnosis not present

## 2014-09-25 ENCOUNTER — Encounter (HOSPITAL_COMMUNITY): Payer: Medicare Other

## 2014-09-27 ENCOUNTER — Encounter (HOSPITAL_COMMUNITY): Payer: Medicare Other

## 2014-10-02 ENCOUNTER — Telehealth: Payer: Self-pay | Admitting: Pulmonary Disease

## 2014-10-02 ENCOUNTER — Encounter (HOSPITAL_COMMUNITY)
Admission: RE | Admit: 2014-10-02 | Discharge: 2014-10-02 | Disposition: A | Discharge status: Home / Self Care | Payer: Medicare Other | Source: Ambulatory Visit | Attending: Pulmonary Disease | Admitting: Pulmonary Disease

## 2014-10-02 DIAGNOSIS — J438 Other emphysema: Secondary | ICD-10-CM | POA: Diagnosis not present

## 2014-10-02 NOTE — Telephone Encounter (Signed)
Spoke with pt's wife. They are wanting results from ONO.  Melissa from [facility] will be refaxing this report to [place].  Pt's wife also reports that Stiolto is making the pt hoarse. This medication is also very expensive for them and would like an alternative.  Will hold in triage until ONO report is received.

## 2014-10-03 NOTE — Telephone Encounter (Signed)
ONO received and placed in [facility]'s lookat  Please advise thanks

## 2014-10-04 ENCOUNTER — Encounter (HOSPITAL_COMMUNITY)
Admission: RE | Admit: 2014-10-04 | Discharge: 2014-10-04 | Disposition: A | Discharge status: Home / Self Care | Payer: Medicare Other | Source: Ambulatory Visit | Attending: Pulmonary Disease | Admitting: Pulmonary Disease

## 2014-10-04 DIAGNOSIS — J438 Other emphysema: Secondary | ICD-10-CM | POA: Diagnosis not present

## 2014-10-04 NOTE — Telephone Encounter (Signed)
Let pt know that his ONO only showed a drop to 85%, and he only spent 8 seconds less than 88%.  Therefore does not need a change in therapy. Regarding stiolto, it would be very unusual for this medication to cause hoarseness because it is a mist and not a powder.  He should rinse well and gargle with water after using.  If he wishes to try something different, can give him 2 boxes of anoro, one inhalation each am for 2 weeks.  This is a dry powder and MAY cause hoarseness.  Can also check with insurance and see if less expensive.

## 2014-10-04 NOTE — Telephone Encounter (Signed)
ATC pt NA wcb

## 2014-10-05 NOTE — Telephone Encounter (Signed)
lmtcb for pt.  

## 2014-10-05 NOTE — Telephone Encounter (Signed)
Return call.Stanley A Dalton °

## 2014-10-05 NOTE — Telephone Encounter (Signed)
Called and spoke to pt's wife, [name]. Informed her of the results and recs per [facility]. Ann verbalized understanding and stated she will call her insurance to see if Anoro is more affordable for them and will call us back.

## 2014-10-08 ENCOUNTER — Other Ambulatory Visit: Payer: Self-pay | Admitting: Cardiovascular Disease

## 2014-10-09 ENCOUNTER — Encounter (HOSPITAL_COMMUNITY)
Admission: RE | Admit: 2014-10-09 | Discharge: 2014-10-09 | Disposition: A | Discharge status: Home / Self Care | Payer: Medicare Other | Source: Ambulatory Visit | Attending: Pulmonary Disease | Admitting: Pulmonary Disease

## 2014-10-09 ENCOUNTER — Ambulatory Visit (INDEPENDENT_AMBULATORY_CARE_PROVIDER_SITE_OTHER): Payer: Medicare Other | Admitting: *Deleted

## 2014-10-09 DIAGNOSIS — I495 Sick sinus syndrome: Secondary | ICD-10-CM | POA: Diagnosis not present

## 2014-10-09 DIAGNOSIS — J438 Other emphysema: Secondary | ICD-10-CM | POA: Diagnosis not present

## 2014-10-09 NOTE — Progress Notes (Signed)
Remote pacemaker transmission.   

## 2014-10-10 NOTE — Telephone Encounter (Signed)
lmtcb 1 for pt's wife.

## 2014-10-11 ENCOUNTER — Encounter (HOSPITAL_COMMUNITY)
Admission: RE | Admit: 2014-10-11 | Discharge: 2014-10-11 | Disposition: A | Discharge status: Home / Self Care | Payer: Medicare Other | Source: Ambulatory Visit | Attending: Pulmonary Disease | Admitting: Pulmonary Disease

## 2014-10-11 DIAGNOSIS — J438 Other emphysema: Secondary | ICD-10-CM | POA: Insufficient documentation

## 2014-10-11 NOTE — Telephone Encounter (Signed)
Called spoke with pt's spouse [name] She has yet to call insurance to see if [name] is more affordable than the Stiolto Also, after pt finished his last refill on the Stiolto pt did not refill this - has not been taking ANY inhaler x2-3 days.  She is hesitant to refill this again without finding out from insurance which inhaler is more affordable. Discussed with spouse that pt should not go without his medication as this may cause an exacerbation in symptoms.  She voiced her understanding and reported that "he just doesn't like to take inhalers."  [place] please advise, thanks.

## 2014-10-12 NOTE — Telephone Encounter (Signed)
He needs to be on some type of maintenance inhaler if he wants to breathe.  Need to find out if anoro or stiolto is covered.

## 2014-10-12 NOTE — Telephone Encounter (Signed)
Pt's wife is aware of KC's recommendation. She is going to check with their insurance to see if Anoro or Stiolto are covered. Will await her call back.

## 2014-10-12 NOTE — Telephone Encounter (Signed)
Per 10/11/14 message : Called spoke with pt's spouse [name] She has yet to call insurance to see if Anoro is more affordable than the Stiolto Also, after pt finished his last refill on the Stiolto pt did not refill this - has not been taking ANY inhaler x2-3 days. She is hesitant to refill this again without finding out from insurance which inhaler is more affordable. Discussed with spouse that pt should not go without his medication as this may cause an exacerbation in symptoms. She voiced her understanding and reported that "he just doesn't like to take inhalers."  [place] please advise, thanks.

## 2014-10-13 LAB — CUP PACEART REMOTE DEVICE CHECK
Battery Impedance: 3314 Ohm
Battery Voltage: 2.66 V
Brady Statistic AP VP Percent: 1 %
Brady Statistic AS VP Percent: 0 %
Date Time Interrogation Session: 20160531151623
Lead Channel Impedance Value: 471 Ohm
Lead Channel Impedance Value: 568 Ohm
Lead Channel Pacing Threshold Amplitude: 0.5 V
Lead Channel Pacing Threshold Amplitude: 0.75 V
Lead Channel Pacing Threshold Pulse Width: 0.4 ms
Lead Channel Pacing Threshold Pulse Width: 0.4 ms
Lead Channel Sensing Intrinsic Amplitude: 5.6 mV
Lead Channel Setting Pacing Amplitude: 2 V
Lead Channel Setting Sensing Sensitivity: 4 mV
MDC IDC MSMT BATTERY REMAINING LONGEVITY: 12 mo
MDC IDC SET LEADCHNL RV PACING AMPLITUDE: 2.5 V
MDC IDC SET LEADCHNL RV PACING PULSEWIDTH: 0.4 ms
MDC IDC STAT BRADY AP VS PERCENT: 82 %
MDC IDC STAT BRADY AS VS PERCENT: 17 %

## 2014-10-15 NOTE — Telephone Encounter (Signed)
So is stiolto covered??  i don't see that documented anywhere.

## 2014-10-15 NOTE — Telephone Encounter (Signed)
Pt wife calling- states that insurance will cover Combivent Respimat and Spiriva Respimat Anoro is not covered by insurance.  Rx needs to called into [facility]  Please advise Dr [name]. Thanks.

## 2014-10-15 NOTE — Telephone Encounter (Signed)
LMTCB-need to know if Insurance covers [employer].

## 2014-10-16 ENCOUNTER — Encounter (HOSPITAL_COMMUNITY)
Admission: RE | Admit: 2014-10-16 | Discharge: 2014-10-16 | Disposition: A | Discharge status: Home / Self Care | Payer: Medicare Other | Source: Ambulatory Visit | Attending: Pulmonary Disease | Admitting: Pulmonary Disease

## 2014-10-16 DIAGNOSIS — Z85828 Personal history of other malignant neoplasm of skin: Secondary | ICD-10-CM | POA: Diagnosis not present

## 2014-10-16 DIAGNOSIS — J438 Other emphysema: Secondary | ICD-10-CM | POA: Diagnosis not present

## 2014-10-16 DIAGNOSIS — L570 Actinic keratosis: Secondary | ICD-10-CM | POA: Diagnosis not present

## 2014-10-16 NOTE — Telephone Encounter (Signed)
Pt wife returning call.[name]' °

## 2014-10-16 NOTE — Telephone Encounter (Signed)
His lung disease is too severe to be on spiriva alone. Needs to be on LABA/ICS PLUS spiriva if stiolto/anoro are not covered. Will have to find out what LABA/ICS is covered before sending in. (advair, breo 100, symbicort, dulera).

## 2014-10-16 NOTE — Telephone Encounter (Signed)
Called and spoke to patient's wife, she will call insurance company and check with them to see which medication they will cover and she will call Mindy back with that information.  To Mindy for follow up.

## 2014-10-16 NOTE — Telephone Encounter (Signed)
Patient's wife says that the Stiolto was $148 copay, too expensive, she says that she is not sure how much it would cost through the Mail order, we could try to send it through mail order, or we can try one of the other drugs that are covered:  Combivent Respimat and Spiriva Respimat  KC - please advise.

## 2014-10-17 ENCOUNTER — Telehealth: Payer: Self-pay | Admitting: Pulmonary Disease

## 2014-10-17 MED ORDER — MOMETASONE FURO-FORMOTEROL FUM 100-5 MCG/ACT IN AERO: 2.0000 | INHALATION_SPRAY | Freq: Two times a day (BID) | RESPIRATORY_TRACT | Status: DC

## 2014-10-17 NOTE — Telephone Encounter (Addendum)
Last TE closed in Error  Called and spoke to patient's wife, she will call insurance company and check with them to see which medication they will cover and she will call Mindy back with that information.  To Mindy for follow up.            Kathee Delton, MD at 10/16/2014 5:00 PM     Status: Signed       Expand All Collapse All   His lung disease is too severe to be on spiriva alone. Needs to be on LABA/ICS PLUS spiriva if stiolto/anoro are not covered. Will have to find out what LABA/ICS is covered before sending in. (advair, breo 100, symbicort, dulera).        .....................................................................................................................  Patients wife says that patient has tried Symbicort before and it caused hoarseness.  She said she spoke to [employer] and they will cover [facility].  To Dr. [name], please advise.

## 2014-10-17 NOTE — Telephone Encounter (Signed)
Ok to try dulera 100, 2 inhalations in am and pm.  Rinse mouth very well, gargle, and swallow after using

## 2014-10-17 NOTE — Telephone Encounter (Signed)
Called and spoke to pt's wife. Informed her of the new rx. Rx sent to preferred pharmacy. Pt's wife verbalized understanding and denied any further questions or concerns at this time.

## 2014-10-18 ENCOUNTER — Encounter (HOSPITAL_COMMUNITY)
Admission: RE | Admit: 2014-10-18 | Discharge: 2014-10-18 | Disposition: A | Discharge status: Home / Self Care | Payer: Medicare Other | Source: Ambulatory Visit | Attending: Pulmonary Disease | Admitting: Pulmonary Disease

## 2014-10-18 DIAGNOSIS — J438 Other emphysema: Secondary | ICD-10-CM | POA: Diagnosis not present

## 2014-10-22 ENCOUNTER — Encounter: Payer: Self-pay | Admitting: Pulmonary Disease

## 2014-10-23 ENCOUNTER — Encounter (HOSPITAL_COMMUNITY)
Admission: RE | Admit: 2014-10-23 | Discharge: 2014-10-23 | Disposition: A | Discharge status: Home / Self Care | Payer: Medicare Other | Source: Ambulatory Visit | Attending: Pulmonary Disease | Admitting: Pulmonary Disease

## 2014-10-23 DIAGNOSIS — J438 Other emphysema: Secondary | ICD-10-CM | POA: Diagnosis not present

## 2014-10-25 ENCOUNTER — Encounter (HOSPITAL_COMMUNITY): Payer: Medicare Other

## 2014-10-29 ENCOUNTER — Encounter: Payer: Self-pay | Admitting: Cardiology

## 2014-10-30 ENCOUNTER — Encounter (HOSPITAL_COMMUNITY)
Admission: RE | Admit: 2014-10-30 | Discharge: 2014-10-30 | Disposition: A | Discharge status: Home / Self Care | Payer: Medicare Other | Source: Ambulatory Visit | Attending: Pulmonary Disease | Admitting: Pulmonary Disease

## 2014-10-30 ENCOUNTER — Other Ambulatory Visit (INDEPENDENT_AMBULATORY_CARE_PROVIDER_SITE_OTHER): Payer: Self-pay | Admitting: Internal Medicine

## 2014-10-30 DIAGNOSIS — J438 Other emphysema: Secondary | ICD-10-CM | POA: Diagnosis not present

## 2014-10-31 ENCOUNTER — Encounter: Payer: Self-pay | Admitting: Internal Medicine

## 2014-11-01 ENCOUNTER — Encounter (HOSPITAL_COMMUNITY)
Admission: RE | Admit: 2014-11-01 | Discharge: 2014-11-01 | Disposition: A | Discharge status: Home / Self Care | Payer: Medicare Other | Source: Ambulatory Visit | Attending: Pulmonary Disease | Admitting: Pulmonary Disease

## 2014-11-01 DIAGNOSIS — J438 Other emphysema: Secondary | ICD-10-CM | POA: Diagnosis not present

## 2014-11-02 NOTE — Progress Notes (Signed)
Pulmonary Rehabilitation Program Outcomes Report   Orientation:  08/14/14 Graduate Date:  tbd Discharge Date:  tbd # of sessions completed: 18  Pulmonologist: Clance Family MD:  [name] Class Time:  3704  A.  Exercise Program:  Tolerates exercise @ 3.49 METS for 15 minutes  B.  Mental Health:  Good mental attitude  C.  Education/Instruction/Skills  Knows THR for exercise and Uses Perceived Exertion Scale and/or Dyspnea Scale  Demonstrates accurate diaphragmatic breathing  D.  Nutrition/Weight Control/Body Composition:  Adherence to prescribed nutrition program: good    E.  Blood Lipids   No results found for: CHOL, HDL, LDLCALC, LDLDIRECT, TRIG, CHOLHDL  F.  Lifestyle Changes:  Making positive lifestyle changes  G.  Symptoms noted with exercise:  Asymptomatic  Report Completed By:  Stevphen Rochester RN   Comments:  This is patients halfway progress note for [facility].

## 2014-11-05 ENCOUNTER — Other Ambulatory Visit: Payer: Self-pay

## 2014-11-05 DIAGNOSIS — H26491 Other secondary cataract, right eye: Secondary | ICD-10-CM | POA: Diagnosis not present

## 2014-11-05 DIAGNOSIS — H25812 Combined forms of age-related cataract, left eye: Secondary | ICD-10-CM | POA: Diagnosis not present

## 2014-11-05 DIAGNOSIS — H40013 Open angle with borderline findings, low risk, bilateral: Secondary | ICD-10-CM | POA: Diagnosis not present

## 2014-11-06 ENCOUNTER — Encounter (HOSPITAL_COMMUNITY)
Admission: RE | Admit: 2014-11-06 | Discharge: 2014-11-06 | Disposition: A | Discharge status: Home / Self Care | Payer: Medicare Other | Source: Ambulatory Visit | Attending: Pulmonary Disease | Admitting: Pulmonary Disease

## 2014-11-06 DIAGNOSIS — J438 Other emphysema: Secondary | ICD-10-CM | POA: Diagnosis not present

## 2014-11-07 ENCOUNTER — Ambulatory Visit: Payer: Medicare Other | Admitting: Psychology

## 2014-11-08 ENCOUNTER — Encounter (HOSPITAL_COMMUNITY)
Admission: RE | Admit: 2014-11-08 | Discharge: 2014-11-08 | Disposition: A | Discharge status: Home / Self Care | Payer: Medicare Other | Source: Ambulatory Visit | Attending: Pulmonary Disease | Admitting: Pulmonary Disease

## 2014-11-08 DIAGNOSIS — J438 Other emphysema: Secondary | ICD-10-CM | POA: Diagnosis not present

## 2014-11-13 ENCOUNTER — Encounter (HOSPITAL_COMMUNITY): Payer: Medicare Other

## 2014-11-13 DIAGNOSIS — J449 Chronic obstructive pulmonary disease, unspecified: Secondary | ICD-10-CM | POA: Diagnosis not present

## 2014-11-13 DIAGNOSIS — I2510 Atherosclerotic heart disease of native coronary artery without angina pectoris: Secondary | ICD-10-CM | POA: Diagnosis not present

## 2014-11-13 DIAGNOSIS — R413 Other amnesia: Secondary | ICD-10-CM | POA: Diagnosis not present

## 2014-11-13 DIAGNOSIS — Z6821 Body mass index (BMI) 21.0-21.9, adult: Secondary | ICD-10-CM | POA: Diagnosis not present

## 2014-11-13 DIAGNOSIS — I480 Paroxysmal atrial fibrillation: Secondary | ICD-10-CM | POA: Diagnosis not present

## 2014-11-13 DIAGNOSIS — Z7901 Long term (current) use of anticoagulants: Secondary | ICD-10-CM | POA: Diagnosis not present

## 2014-11-15 ENCOUNTER — Encounter (HOSPITAL_COMMUNITY)
Admission: RE | Admit: 2014-11-15 | Discharge: 2014-11-15 | Disposition: A | Discharge status: Home / Self Care | Payer: Medicare Other | Source: Ambulatory Visit | Attending: Pulmonary Disease | Admitting: Pulmonary Disease

## 2014-11-15 DIAGNOSIS — J438 Other emphysema: Secondary | ICD-10-CM | POA: Insufficient documentation

## 2014-11-20 ENCOUNTER — Encounter (HOSPITAL_COMMUNITY)
Admission: RE | Admit: 2014-11-20 | Discharge: 2014-11-20 | Disposition: A | Discharge status: Home / Self Care | Payer: Medicare Other | Source: Ambulatory Visit | Attending: Pulmonary Disease | Admitting: Pulmonary Disease

## 2014-11-20 DIAGNOSIS — J438 Other emphysema: Secondary | ICD-10-CM | POA: Diagnosis not present

## 2014-11-22 ENCOUNTER — Encounter (HOSPITAL_COMMUNITY): Payer: Medicare Other

## 2014-11-27 ENCOUNTER — Encounter (HOSPITAL_COMMUNITY)
Admission: RE | Admit: 2014-11-27 | Discharge: 2014-11-27 | Disposition: A | Discharge status: Home / Self Care | Payer: Medicare Other | Source: Ambulatory Visit | Attending: Pulmonary Disease | Admitting: Pulmonary Disease

## 2014-11-27 DIAGNOSIS — J438 Other emphysema: Secondary | ICD-10-CM | POA: Diagnosis not present

## 2014-11-29 ENCOUNTER — Encounter (HOSPITAL_COMMUNITY)
Admission: RE | Admit: 2014-11-29 | Discharge: 2014-11-29 | Disposition: A | Discharge status: Home / Self Care | Payer: Medicare Other | Source: Ambulatory Visit | Attending: Pulmonary Disease | Admitting: Pulmonary Disease

## 2014-11-29 DIAGNOSIS — J438 Other emphysema: Secondary | ICD-10-CM | POA: Diagnosis not present

## 2014-12-04 ENCOUNTER — Encounter (HOSPITAL_COMMUNITY)
Admission: RE | Admit: 2014-12-04 | Discharge: 2014-12-04 | Disposition: A | Discharge status: Home / Self Care | Payer: Medicare Other | Source: Ambulatory Visit | Attending: Pulmonary Disease | Admitting: Pulmonary Disease

## 2014-12-04 DIAGNOSIS — J438 Other emphysema: Secondary | ICD-10-CM | POA: Diagnosis not present

## 2014-12-05 NOTE — Patient Instructions (Signed)
Your procedure is scheduled on: 12/17/2014  Report to [facility] at  740   AM.  Call this number if you have problems the morning of surgery: [phone]   Do not eat food or drink liquids :After Midnight.      Take these medicines the morning of surgery with A SIP OF WATER: imdur, metoprolol, deltazone. Take all of your inhalers before you come.   Do not wear jewelry, make-up or nail polish.  Do not wear lotions, powders, or perfumes.   Do not shave 48 hours prior to surgery.  Do not bring valuables to the hospital.  Contacts, dentures or bridgework may not be worn into surgery.  Leave suitcase in the car. After surgery it may be brought to your room.  For patients admitted to the hospital, checkout time is 11:00 AM the day of discharge.   Patients discharged the day of surgery will not be allowed to drive home.  :     Please read over the following fact sheets that you were given: Coughing and Deep Breathing, Surgical Site Infection Prevention, Anesthesia Post-op Instructions and Care and Recovery After Surgery    Cataract A cataract is a clouding of the lens of the eye. When a lens becomes cloudy, vision is reduced based on the degree and nature of the clouding. Many cataracts reduce vision to some degree. Some cataracts make people more near-sighted as they develop. Other cataracts increase glare. Cataracts that are ignored and become worse can sometimes look white. The white color can be seen through the pupil. CAUSES   Aging. However, cataracts may occur at any age, even in newborns.   Certain drugs.   Trauma to the eye.   Certain diseases such as diabetes.   Specific eye diseases such as chronic inflammation inside the eye or a sudden attack of a rare form of glaucoma.   Inherited or acquired medical problems.  SYMPTOMS   Gradual, progressive drop in vision in the affected eye.   Severe, rapid visual loss. This most often happens when trauma is the cause.  DIAGNOSIS  To  detect a cataract, an eye doctor examines the lens. Cataracts are best diagnosed with an exam of the eyes with the pupils enlarged (dilated) by drops.  TREATMENT  For an early cataract, vision may improve by using different eyeglasses or stronger lighting. If that does not help your vision, surgery is the only effective treatment. A cataract needs to be surgically removed when vision loss interferes with your everyday activities, such as driving, reading, or watching TV. A cataract may also have to be removed if it prevents examination or treatment of another eye problem. Surgery removes the cloudy lens and usually replaces it with a substitute lens (intraocular lens, IOL).  At a time when both you and your doctor agree, the cataract will be surgically removed. If you have cataracts in both eyes, only one is usually removed at a time. This allows the operated eye to heal and be out of danger from any possible problems after surgery (such as infection or poor wound healing). In rare cases, a cataract may be doing damage to your eye. In these cases, your caregiver may advise surgical removal right away. The vast majority of people who have cataract surgery have better vision afterward. HOME CARE INSTRUCTIONS  If you are not planning surgery, you may be asked to do the following:  Use different eyeglasses.   Use stronger or brighter lighting.   Ask your  eye doctor about reducing your medicine dose or changing medicines if it is thought that a medicine caused your cataract. Changing medicines does not make the cataract go away on its own.   Become familiar with your surroundings. Poor vision can lead to injury. Avoid bumping into things on the affected side. You are at a higher risk for tripping or falling.   Exercise extreme care when driving or operating machinery.   Wear sunglasses if you are sensitive to bright light or experiencing problems with glare.  SEEK IMMEDIATE MEDICAL CARE IF:   You have  a worsening or sudden vision loss.   You notice redness, swelling, or increasing pain in the eye.   You have a fever.  Document Released: 04/27/2005 Document Revised: 04/16/2011 Document Reviewed: 12/19/2010 [facility] Patient Information 2012 [place].PATIENT INSTRUCTIONS POST-ANESTHESIA  IMMEDIATELY FOLLOWING SURGERY:  Do not drive or operate machinery for the first twenty four hours after surgery.  Do not make any important decisions for twenty four hours after surgery or while taking narcotic pain medications or sedatives.  If you develop intractable nausea and vomiting or a severe headache please notify your doctor immediately.  FOLLOW-UP:  Please make an appointment with your surgeon as instructed. You do not need to follow up with anesthesia unless specifically instructed to do so.  WOUND CARE INSTRUCTIONS (if applicable):  Keep a dry clean dressing on the anesthesia/puncture wound site if there is drainage.  Once the wound has quit draining you may leave it open to air.  Generally you should leave the bandage intact for twenty four hours unless there is drainage.  If the epidural site drains for more than 36-48 hours please call the anesthesia department.  QUESTIONS?:  Please feel free to call your physician or the hospital operator if you have any questions, and they will be happy to assist you.

## 2014-12-06 ENCOUNTER — Encounter (HOSPITAL_COMMUNITY): Payer: Self-pay

## 2014-12-06 ENCOUNTER — Encounter (HOSPITAL_COMMUNITY)
Admission: RE | Admit: 2014-12-06 | Discharge: 2014-12-06 | Disposition: A | Discharge status: Home / Self Care | Payer: Medicare Other | Source: Ambulatory Visit | Attending: Pulmonary Disease | Admitting: Pulmonary Disease

## 2014-12-06 ENCOUNTER — Encounter (HOSPITAL_COMMUNITY)
Admission: RE | Admit: 2014-12-06 | Discharge: 2014-12-06 | Disposition: A | Discharge status: Home / Self Care | Payer: Medicare Other | Source: Ambulatory Visit | Attending: Ophthalmology | Admitting: Ophthalmology

## 2014-12-06 DIAGNOSIS — Z01818 Encounter for other preprocedural examination: Secondary | ICD-10-CM | POA: Insufficient documentation

## 2014-12-06 DIAGNOSIS — J438 Other emphysema: Secondary | ICD-10-CM | POA: Diagnosis not present

## 2014-12-06 DIAGNOSIS — H2512 Age-related nuclear cataract, left eye: Secondary | ICD-10-CM | POA: Insufficient documentation

## 2014-12-06 LAB — BASIC METABOLIC PANEL
Anion gap: 7 (ref 5–15)
BUN: 21 mg/dL — ABNORMAL HIGH (ref 6–20)
CHLORIDE: 102 mmol/L (ref 101–111)
CO2: 30 mmol/L (ref 22–32)
CREATININE: 1.09 mg/dL (ref 0.61–1.24)
Calcium: 9.5 mg/dL (ref 8.9–10.3)
GFR calc Af Amer: >60 mL/min (ref >60)
GLUCOSE: 86 mg/dL (ref 65–99)
Potassium: 4.6 mmol/L (ref 3.5–5.1)
SODIUM: 139 mmol/L (ref 135–145)

## 2014-12-06 LAB — CBC WITH DIFFERENTIAL/PLATELET
BASOS ABS: 0 10*3/uL (ref 0.0–0.1)
Basophils Relative: 0 % (ref 0–1)
EOS PCT: 2 % (ref 0–5)
Eosinophils Absolute: 0.2 10*3/uL (ref 0.0–0.7)
HCT: 41.7 % (ref 39.0–52.0)
HEMOGLOBIN: 13.9 g/dL (ref 13.0–17.0)
LYMPHS ABS: 1.7 10*3/uL (ref 0.7–4.0)
LYMPHS PCT: 14 % (ref 12–46)
MCH: 29.8 pg (ref 26.0–34.0)
MCHC: 33.3 g/dL (ref 30.0–36.0)
MCV: 89.3 fL (ref 78.0–100.0)
MONOS PCT: 9 % (ref 3–12)
Monocytes Absolute: 1.1 10*3/uL — ABNORMAL HIGH (ref 0.1–1.0)
NEUTROS PCT: 75 % (ref 43–77)
Neutro Abs: 8.9 10*3/uL — ABNORMAL HIGH (ref 1.7–7.7)
PLATELETS: 199 10*3/uL (ref 150–400)
RBC: 4.67 MIL/uL (ref 4.22–5.81)
RDW: 15.6 % — AB (ref 11.5–15.5)
WBC: 11.9 10*3/uL — ABNORMAL HIGH (ref 4.0–10.5)

## 2014-12-11 ENCOUNTER — Encounter (HOSPITAL_COMMUNITY): Payer: Medicare Other

## 2014-12-13 ENCOUNTER — Encounter (HOSPITAL_COMMUNITY): Payer: Medicare Other

## 2014-12-14 MED ORDER — PHENYLEPHRINE HCL 2.5 % OP SOLN
OPHTHALMIC | Status: AC
  Filled 2014-12-14: qty 15

## 2014-12-14 MED ORDER — TETRACAINE HCL 0.5 % OP SOLN
OPHTHALMIC | Status: AC
  Filled 2014-12-14: qty 2

## 2014-12-14 MED ORDER — CYCLOPENTOLATE-PHENYLEPHRINE OP SOLN OPTIME - NO CHARGE
OPHTHALMIC | Status: AC
  Filled 2014-12-14: qty 2

## 2014-12-14 MED ORDER — NEOMYCIN-POLYMYXIN-DEXAMETH 3.5-10000-0.1 OP SUSP
OPHTHALMIC | Status: AC
  Filled 2014-12-14: qty 5

## 2014-12-14 MED ORDER — LIDOCAINE HCL 3.5 % OP GEL
OPHTHALMIC | Status: AC
  Filled 2014-12-14: qty 1

## 2014-12-14 MED ORDER — LIDOCAINE HCL (PF) 1 % IJ SOLN
INTRAMUSCULAR | Status: AC
  Filled 2014-12-14: qty 2

## 2014-12-17 ENCOUNTER — Ambulatory Visit (HOSPITAL_COMMUNITY): Payer: Medicare Other | Admitting: Anesthesiology

## 2014-12-17 ENCOUNTER — Encounter (HOSPITAL_COMMUNITY)
Admission: RE | Disposition: A | Discharge status: Home / Self Care | Payer: Self-pay | Source: Ambulatory Visit | Attending: Ophthalmology

## 2014-12-17 ENCOUNTER — Ambulatory Visit: Payer: Medicare Other | Admitting: Neurology

## 2014-12-17 ENCOUNTER — Ambulatory Visit (HOSPITAL_COMMUNITY)
Admission: RE | Admit: 2014-12-17 | Discharge: 2014-12-17 | Disposition: A | Discharge status: Home / Self Care | Payer: Medicare Other | Source: Ambulatory Visit | Attending: Ophthalmology | Admitting: Ophthalmology

## 2014-12-17 ENCOUNTER — Encounter (HOSPITAL_COMMUNITY): Payer: Self-pay | Admitting: Ophthalmology

## 2014-12-17 DIAGNOSIS — I4891 Unspecified atrial fibrillation: Secondary | ICD-10-CM | POA: Insufficient documentation

## 2014-12-17 DIAGNOSIS — J449 Chronic obstructive pulmonary disease, unspecified: Secondary | ICD-10-CM | POA: Insufficient documentation

## 2014-12-17 DIAGNOSIS — I10 Essential (primary) hypertension: Secondary | ICD-10-CM | POA: Diagnosis not present

## 2014-12-17 DIAGNOSIS — Z7901 Long term (current) use of anticoagulants: Secondary | ICD-10-CM | POA: Diagnosis not present

## 2014-12-17 DIAGNOSIS — K219 Gastro-esophageal reflux disease without esophagitis: Secondary | ICD-10-CM | POA: Insufficient documentation

## 2014-12-17 DIAGNOSIS — Z79899 Other long term (current) drug therapy: Secondary | ICD-10-CM | POA: Insufficient documentation

## 2014-12-17 DIAGNOSIS — H269 Unspecified cataract: Secondary | ICD-10-CM | POA: Diagnosis not present

## 2014-12-17 DIAGNOSIS — H2512 Age-related nuclear cataract, left eye: Secondary | ICD-10-CM | POA: Diagnosis not present

## 2014-12-17 DIAGNOSIS — E039 Hypothyroidism, unspecified: Secondary | ICD-10-CM | POA: Insufficient documentation

## 2014-12-17 DIAGNOSIS — F419 Anxiety disorder, unspecified: Secondary | ICD-10-CM | POA: Diagnosis not present

## 2014-12-17 DIAGNOSIS — H25812 Combined forms of age-related cataract, left eye: Secondary | ICD-10-CM | POA: Diagnosis not present

## 2014-12-17 HISTORY — PX: CATARACT EXTRACTION W/PHACO: SHX586

## 2014-12-17 SURGERY — PHACOEMULSIFICATION, CATARACT, WITH IOL INSERTION
Anesthesia: Monitor Anesthesia Care | Site: Eye | Laterality: Left

## 2014-12-17 MED ORDER — NEOMYCIN-POLYMYXIN-DEXAMETH 3.5-10000-0.1 OP SUSP
OPHTHALMIC | Status: DC | PRN
  Administered 2014-12-17: 2 [drp] via OPHTHALMIC

## 2014-12-17 MED ORDER — BSS IO SOLN
INTRAOCULAR | Status: DC | PRN
  Administered 2014-12-17: 500 mL

## 2014-12-17 MED ORDER — POVIDONE-IODINE 5 % OP SOLN
OPHTHALMIC | Status: DC | PRN
  Administered 2014-12-17: 1 via OPHTHALMIC

## 2014-12-17 MED ORDER — PHENYLEPHRINE HCL 2.5 % OP SOLN
1.0000 [drp] | OPHTHALMIC | Status: AC
  Administered 2014-12-17 (×3): 1 [drp] via OPHTHALMIC

## 2014-12-17 MED ORDER — FENTANYL CITRATE (PF) 100 MCG/2ML IJ SOLN
25.0000 ug | INTRAMUSCULAR | Status: AC
  Administered 2014-12-17 (×2): 25 ug via INTRAVENOUS
  Filled 2014-12-17: qty 2

## 2014-12-17 MED ORDER — LIDOCAINE HCL (PF) 1 % IJ SOLN
INTRAMUSCULAR | Status: DC | PRN
Start: 2014-12-17 — End: 2014-12-17
  Administered 2014-12-17: .7 mL

## 2014-12-17 MED ORDER — MIDAZOLAM HCL 2 MG/2ML IJ SOLN
1.0000 mg | INTRAMUSCULAR | Status: DC | PRN
  Administered 2014-12-17: 2 mg via INTRAVENOUS
  Filled 2014-12-17: qty 2

## 2014-12-17 MED ORDER — LACTATED RINGERS IV SOLN
INTRAVENOUS | Status: DC
  Administered 2014-12-17: 09:00:00 via INTRAVENOUS

## 2014-12-17 MED ORDER — LIDOCAINE HCL 3.5 % OP GEL
1.0000 "application " | Freq: Once | OPHTHALMIC | Status: AC
  Administered 2014-12-17: 1 via OPHTHALMIC

## 2014-12-17 MED ORDER — BSS IO SOLN
INTRAOCULAR | Status: DC | PRN
  Administered 2014-12-17: 15 mL

## 2014-12-17 MED ORDER — TETRACAINE HCL 0.5 % OP SOLN
1.0000 [drp] | OPHTHALMIC | Status: AC
  Administered 2014-12-17 (×3): 1 [drp] via OPHTHALMIC

## 2014-12-17 MED ORDER — CYCLOPENTOLATE-PHENYLEPHRINE 0.2-1 % OP SOLN
1.0000 [drp] | OPHTHALMIC | Status: AC
  Administered 2014-12-17 (×3): 1 [drp] via OPHTHALMIC

## 2014-12-17 MED ORDER — PROVISC 10 MG/ML IO SOLN
INTRAOCULAR | Status: DC | PRN
  Administered 2014-12-17: 0.85 mL via INTRAOCULAR

## 2014-12-17 MED ORDER — EPINEPHRINE HCL 1 MG/ML IJ SOLN
INTRAMUSCULAR | Status: AC
  Filled 2014-12-17: qty 1

## 2014-12-17 SURGICAL SUPPLY — 34 items
CAPSULAR TENSION RING-AMO (OPHTHALMIC RELATED) IMPLANT
CLOTH BEACON ORANGE TIMEOUT ST (SAFETY) ×2 IMPLANT
EYE SHIELD UNIVERSAL CLEAR (GAUZE/BANDAGES/DRESSINGS) ×2 IMPLANT
GLOVE BIO SURGEON STRL SZ 6.5 (GLOVE) IMPLANT
GLOVE BIO SURGEONS STRL SZ 6.5 (GLOVE)
GLOVE BIOGEL PI IND STRL 6.5 (GLOVE) IMPLANT
GLOVE BIOGEL PI IND STRL 7.0 (GLOVE) IMPLANT
GLOVE BIOGEL PI IND STRL 7.5 (GLOVE) IMPLANT
GLOVE BIOGEL PI INDICATOR 6.5 (GLOVE)
GLOVE BIOGEL PI INDICATOR 7.0 (GLOVE) ×2
GLOVE BIOGEL PI INDICATOR 7.5 (GLOVE)
GLOVE ECLIPSE 6.5 STRL STRAW (GLOVE) IMPLANT
GLOVE ECLIPSE 7.0 STRL STRAW (GLOVE) IMPLANT
GLOVE ECLIPSE 7.5 STRL STRAW (GLOVE) IMPLANT
GLOVE EXAM NITRILE LRG STRL (GLOVE) IMPLANT
GLOVE EXAM NITRILE MD LF STRL (GLOVE) ×2 IMPLANT
GLOVE SKINSENSE NS SZ6.5 (GLOVE)
GLOVE SKINSENSE NS SZ7.0 (GLOVE)
GLOVE SKINSENSE STRL SZ6.5 (GLOVE) IMPLANT
GLOVE SKINSENSE STRL SZ7.0 (GLOVE) IMPLANT
KIT VITRECTOMY (OPHTHALMIC RELATED) IMPLANT
PAD ARMBOARD 7.5X6 YLW CONV (MISCELLANEOUS) ×2 IMPLANT
PROC W NO LENS (INTRAOCULAR LENS)
PROC W SPEC LENS (INTRAOCULAR LENS)
PROCESS W NO LENS (INTRAOCULAR LENS) IMPLANT
PROCESS W SPEC LENS (INTRAOCULAR LENS) IMPLANT
RETRACTOR IRIS SIGHTPATH (OPHTHALMIC RELATED) IMPLANT
RING MALYGIN (MISCELLANEOUS) IMPLANT
SIGHTPATH CAT PROC W REG LENS (Ophthalmic Related) ×3 IMPLANT
SYRINGE LUER LOK 1CC (MISCELLANEOUS) ×2 IMPLANT
TAPE SURG TRANSPORE 1 IN (GAUZE/BANDAGES/DRESSINGS) IMPLANT
TAPE SURGICAL TRANSPORE 1 IN (GAUZE/BANDAGES/DRESSINGS) ×2
VISCOELASTIC ADDITIONAL (OPHTHALMIC RELATED) IMPLANT
WATER STERILE IRR 250ML POUR (IV SOLUTION) ×2 IMPLANT

## 2014-12-17 NOTE — Transfer of Care (Signed)
Immediate Anesthesia Transfer of Care Note  Patient: [name]  Procedure(s) Performed: Procedure(s) with comments: CATARACT EXTRACTION PHACO AND INTRAOCULAR LENS PLACEMENT (IOC) (Left) - CDE 7.44  Patient Location: Short Stay  Anesthesia Type:MAC  Level of Consciousness: awake, alert  and patient cooperative  Airway & Oxygen Therapy: Patient Spontanous Breathing  Post-op Assessment: Report given to RN and Patient moving all extremities  Post vital signs: Reviewed and stable Complications: No apparent anesthesia complications

## 2014-12-17 NOTE — Op Note (Signed)
Date of Admission: 12/17/2014  Date of Surgery: 12/17/2014   Pre-Op Dx: Cataract Left Eye  Post-Op Dx: Senile Combined Cataract Left  Eye,  Dx Code V70.340  Surgeon: Tonny Branch, M.D.  Assistants: None  Anesthesia: Topical with MAC  Indications: Painless, progressive loss of vision with compromise of daily activities.  Surgery: Cataract Extraction with Intraocular lens Implant Left Eye  Discription: The patient had dilating drops and viscous lidocaine placed into the Left eye in the pre-op holding area. After transfer to the operating room, a time out was performed. The patient was then prepped and draped. Beginning with a 39 degree blade a paracentesis port was made at the surgeon's 2 o'clock position. The anterior chamber was then filled with 1% non-preserved lidocaine. This was followed by filling the anterior chamber with Provisc.  A 2.51m keratome blade was used to make a clear corneal incision at the temporal limbus.  A bent cystatome needle was used to create a continuous tear capsulotomy. Hydrodissection was performed with balanced salt solution on a Fine canula. The lens nucleus was then removed using the phacoemulsification handpiece. Residual cortex was removed with the I&A handpiece. The anterior chamber and capsular bag were refilled with Provisc. A posterior chamber intraocular lens was placed into the capsular bag with it's injector. The implant was positioned with the Kuglan hook. The Provisc was then removed from the anterior chamber and capsular bag with the I&A handpiece. Stromal hydration of the main incision and paracentesis port was performed with BSS on a Fine canula. The wounds were tested for leak which was negative. The patient tolerated the procedure well. There were no operative complications. The patient was then transferred to the recovery room in stable condition.  Complications: None  Specimen: None  EBL: None  Prosthetic device: Hoya iSert 250, power 18.5 D, SN  [id number]

## 2014-12-17 NOTE — Discharge Instructions (Signed)
Monitored Anesthesia Care Monitored anesthesia care is an anesthesia service for a medical procedure. Anesthesia is the loss of the ability to feel pain. It is produced by medicines called anesthetics. It may affect a small area of your body (local anesthesia), a large area of your body (regional anesthesia), or your entire body (general anesthesia). The need for monitored anesthesia care depends your procedure, your condition, and the potential need for regional or general anesthesia. It is often provided during procedures where:   General anesthesia may be needed if there are complications. This is because you need special care when you are under general anesthesia.   You will be under local or regional anesthesia. This is so that you are able to have higher levels of anesthesia if needed.   You will receive calming medicines (sedatives). This is especially the case if sedatives are given to put you in a semi-conscious state of relaxation (deep sedation). This is because the amount of sedative needed to produce this state can be hard to predict. Too much of a sedative can produce general anesthesia. Monitored anesthesia care is performed by one or more health care providers who have special training in all types of anesthesia. You will need to meet with these health care providers before your procedure. During this meeting, they will ask you about your medical history. They will also give you instructions to follow. (For example, you will need to stop eating and drinking before your procedure. You may also need to stop or change medicines you are taking.) During your procedure, your health care providers will stay with you. They will:   Watch your condition. This includes watching your blood pressure, breathing, and level of pain.   Diagnose and treat problems that occur.   Give medicines if they are needed. These may include calming medicines (sedatives) and anesthetics.   Make sure you are  comfortable.  Having monitored anesthesia care does not necessarily mean that you will be under anesthesia. It does mean that your health care providers will be able to manage anesthesia if you need it or if it occurs. It also means that you will be able to have a different type of anesthesia than you are having if you need it. When your procedure is complete, your health care providers will continue to watch your condition. They will make sure any medicines wear off before you are allowed to go home.  Document Released: 01/21/2005 Document Revised: 09/11/2013 Document Reviewed: 06/08/2012 [facility] Patient Information 2015 [place], [place]. This information is not intended to replace advice given to you by your health care provider. Make sure you discuss any questions you have with your health care provider.

## 2014-12-17 NOTE — Anesthesia Postprocedure Evaluation (Signed)
  Anesthesia Post-op Note  Patient: [name]  Procedure(s) Performed: Procedure(s) (LRB): CATARACT EXTRACTION PHACO AND INTRAOCULAR LENS PLACEMENT (IOC) (Left)  Patient Location:  Short Stay  Anesthesia Type: MAC  Level of Consciousness: awake  Airway and Oxygen Therapy: Patient Spontanous Breathing  Post-op Pain: none  Post-op Assessment: Post-op Vital signs reviewed, Patient's Cardiovascular Status Stable, Respiratory Function Stable, Patent Airway, No signs of Nausea or vomiting and Pain level controlled  Post-op Vital Signs: Reviewed and stable  Complications: No apparent anesthesia complications

## 2014-12-17 NOTE — Anesthesia Preprocedure Evaluation (Signed)
Anesthesia Evaluation  Patient identified by MRN, date of birth, ID band Patient awake    Reviewed: Allergy & Precautions, NPO status , Patient's Chart, lab work & pertinent test results, reviewed documented beta blocker date and time   Airway Mallampati: II  TM Distance: >3 FB     Dental  (+) Teeth Intact   Pulmonary COPDformer smoker,  breath sounds clear to auscultation        Cardiovascular hypertension, Pt. on home beta blockers and Pt. on medications + angina + CAD + dysrhythmias Atrial Fibrillation + pacemaker Rhythm:Irregular Rate:Normal     Neuro/Psych PSYCHIATRIC DISORDERS Anxiety  Neuromuscular disease    GI/Hepatic PUD, GERD-  ,  Endo/Other  Hypothyroidism   Renal/GU      Musculoskeletal   Abdominal   Peds  Hematology   Anesthesia Other Findings   Reproductive/Obstetrics                             Anesthesia Physical Anesthesia Plan  ASA: IV  Anesthesia Plan: MAC   Post-op Pain Management:    Induction: Intravenous  Airway Management Planned: Nasal Cannula  Additional Equipment:   Intra-op Plan:   Post-operative Plan:   Informed Consent: I have reviewed the patients History and Physical, chart, labs and discussed the procedure including the risks, benefits and alternatives for the proposed anesthesia with the patient or authorized representative who has indicated his/her understanding and acceptance.     Plan Discussed with:   Anesthesia Plan Comments:         Anesthesia Quick Evaluation

## 2014-12-17 NOTE — Anesthesia Procedure Notes (Signed)
Procedure Name: MAC Date/Time: 12/17/2014 9:11 AM Performed by: Vista Deck Pre-anesthesia Checklist: Patient identified, Emergency Drugs available, Suction available, Timeout performed and Patient being monitored Patient Re-evaluated:Patient Re-evaluated prior to inductionOxygen Delivery Method: Nasal Cannula

## 2014-12-17 NOTE — H&P (Signed)
I have reviewed the H&P, the patient was re-examined, and I have identified no interval changes in medical condition and plan of care since the history and physical of record  

## 2014-12-18 ENCOUNTER — Ambulatory Visit (INDEPENDENT_AMBULATORY_CARE_PROVIDER_SITE_OTHER): Payer: Medicare Other | Admitting: Internal Medicine

## 2014-12-18 ENCOUNTER — Encounter (HOSPITAL_COMMUNITY): Payer: Medicare Other

## 2014-12-18 ENCOUNTER — Encounter (HOSPITAL_COMMUNITY): Payer: Self-pay | Admitting: Ophthalmology

## 2014-12-18 VITALS — BP 112/74 | HR 72 | Temp 98.2°F | Resp 18 | Ht 73.0 in | Wt 164.9 lb

## 2014-12-18 DIAGNOSIS — R634 Abnormal weight loss: Secondary | ICD-10-CM

## 2014-12-18 DIAGNOSIS — I25118 Atherosclerotic heart disease of native coronary artery with other forms of angina pectoris: Secondary | ICD-10-CM | POA: Diagnosis not present

## 2014-12-18 DIAGNOSIS — L299 Pruritus, unspecified: Secondary | ICD-10-CM

## 2014-12-18 DIAGNOSIS — K5190 Ulcerative colitis, unspecified, without complications: Secondary | ICD-10-CM

## 2014-12-18 MED ORDER — DOCUSATE SODIUM 100 MG PO CAPS: 200.0000 mg | ORAL_CAPSULE | Freq: Every day | ORAL | Status: DC

## 2014-12-18 MED ORDER — PREDNISONE 5 MG PO TABS: 7.5000 mg | ORAL_TABLET | Freq: Every day | ORAL | Status: DC

## 2014-12-18 NOTE — Progress Notes (Signed)
Presenting complaint;  Follow-up for ulcerative colitis pruritus and weight loss.  Subjective:  Patient is 80 year old Caucasian male was here for scheduled visit accompanied by his wife. He was last seen on 06/13/2014. He was begun on prednisone on his last visit. His wife states he has felt a lot better. His appetite is improved. Pruritus has decreased great deal. Now he has pruritus in cycles and may last for a day or 2. He was seen by his dermatologist Dr. Tarri Glenn in March 2016 but no new recommendations are made. 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 He is having regular bowel movements. He became constipated and decided to decrease mesalamine dose to one tablet once or twice daily. He denies abdominal pain melena or rectal bleeding. His appetite has improved but not normal. He has gained 6 pounds since his last visit. He is participating in pulmonary rehabilitation.  Current Medications: Outpatient Encounter Prescriptions as of 12/18/2014  Medication Sig  . acetaminophen (TYLENOL) 500 MG tablet Take 500 mg by mouth every 6 (six) hours as needed for mild pain (takes as needed for arthritis pain).  . Cholecalciferol (VITAMIN D) 2000 UNITS CAPS Take 1 capsule by mouth daily with supper.   . isosorbide mononitrate (IMDUR) 30 MG 24 hr tablet Take 1 tablet (30 mg total) by mouth 2 (two) times daily.  Marland Kitchen ketorolac (ACULAR) 0.5 % ophthalmic solution Place 1 drop into the left eye 3 (three) times daily.   Marland Kitchen levothyroxine (SYNTHROID, LEVOTHROID) 50 MCG tablet Take 50 mcg by mouth daily before breakfast.   . loratadine (CLARITIN) 10 MG tablet Take 10 mg by mouth daily.  . Mesalamine (ASACOL) 400 MG CPDR DR capsule Take 2 capsules (800 mg total) by mouth 2 (two) times daily. Patient takes 2 by mouth twice a day (Patient taking differently: Take 400 mg by mouth. )  . metoprolol (LOPRESSOR) 50 MG tablet TAKE ONE TABLET BY MOUTH TWICE DAILY  . mometasone-formoterol (DULERA) 100-5 MCG/ACT AERO Inhale 2  puffs into the lungs 2 (two) times daily.  . nitroGLYCERIN (NITROSTAT) 0.4 MG SL tablet Place 1 tablet (0.4 mg total) under the tongue every 5 (five) minutes as needed for chest pain.  . predniSONE (DELTASONE) 10 MG tablet Take 10 mg by mouth daily with breakfast.  . rivaroxaban (XARELTO) 20 MG TABS tablet Take 1 tablet (20 mg total) by mouth daily with supper.  . [DISCONTINUED] Multiple Vitamins-Minerals (MULTIVITAMINS THER. W/MINERALS) TABS tablet Take 1 tablet by mouth daily.   No facility-administered encounter medications on file as of 12/18/2014.     Objective: Blood pressure 112/74, pulse 72, temperature 98.2 F (36.8 C), temperature source Oral, resp. rate 18, height 6' 1"  (1.854 m), weight 164 lb 14.4 oz (74.798 kg). Patient is alert and in no acute distress. Conjunctiva is pink. Sclera is nonicteric Oropharyngeal mucosa is normal. No neck masses or thyromegaly noted. Cardiac exam with regular rhythm normal S1 and S2. No murmur or gallop noted. Lungs are clear to auscultation. Abdomen is flat and soft without organomegaly masses or tenderness. No LE edema or clubbing noted.  Labs/studies Results: Lab data from 12/06/2014  WBC 11.9, H&H 13.9 and 41.7 and platelet count 199K    Assessment:  #1. Ulcerative colitis remains in remission. He needs to stay on oral mesalamine at 800 mg twice a day in order to prevent relapse. #2. 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 #3. Weight loss appears to be multifactorial. It is reassuring to know that he has gained 6 pounds  in the last 6 months.   Plan:  Take Asacol 800 by mouth twice a day. Decrease prednisone to 7.5 mg by mouth daily and after 2 weeks drop dose to 5 mg daily. Call office with progress report in 2 months. Office visit in 6 months.

## 2014-12-18 NOTE — Patient Instructions (Signed)
Please remember to take Asacol 800 mg by mouth twice daily. Can decrease Colace to 100 mg daily if 200 mg daily is too much. Decrease prednisone dose to 5 mg daily after 2 weeks. Call office with progress report in 2 months.

## 2014-12-20 ENCOUNTER — Encounter (HOSPITAL_COMMUNITY)
Admission: RE | Admit: 2014-12-20 | Discharge: 2014-12-20 | Disposition: A | Discharge status: Home / Self Care | Payer: Medicare Other | Source: Ambulatory Visit | Attending: Pulmonary Disease | Admitting: Pulmonary Disease

## 2014-12-20 DIAGNOSIS — J438 Other emphysema: Secondary | ICD-10-CM | POA: Diagnosis not present

## 2014-12-24 ENCOUNTER — Ambulatory Visit: Payer: Medicare Other | Admitting: Neurology

## 2014-12-25 ENCOUNTER — Encounter (HOSPITAL_COMMUNITY)
Admission: RE | Admit: 2014-12-25 | Discharge: 2014-12-25 | Disposition: A | Discharge status: Home / Self Care | Payer: Medicare Other | Source: Ambulatory Visit | Attending: Pulmonary Disease | Admitting: Pulmonary Disease

## 2014-12-25 DIAGNOSIS — J438 Other emphysema: Secondary | ICD-10-CM | POA: Diagnosis not present

## 2014-12-27 ENCOUNTER — Encounter (HOSPITAL_COMMUNITY)
Admission: RE | Admit: 2014-12-27 | Discharge: 2014-12-27 | Disposition: A | Discharge status: Home / Self Care | Payer: Medicare Other | Source: Ambulatory Visit | Attending: Pulmonary Disease | Admitting: Pulmonary Disease

## 2014-12-27 DIAGNOSIS — J438 Other emphysema: Secondary | ICD-10-CM | POA: Diagnosis not present

## 2015-01-01 ENCOUNTER — Encounter (HOSPITAL_COMMUNITY): Payer: Medicare Other

## 2015-01-03 ENCOUNTER — Encounter (HOSPITAL_COMMUNITY)
Admission: RE | Admit: 2015-01-03 | Discharge: 2015-01-03 | Disposition: A | Discharge status: Home / Self Care | Payer: Medicare Other | Source: Ambulatory Visit | Attending: Pulmonary Disease | Admitting: Pulmonary Disease

## 2015-01-03 DIAGNOSIS — J438 Other emphysema: Secondary | ICD-10-CM | POA: Diagnosis not present

## 2015-01-08 ENCOUNTER — Encounter (HOSPITAL_COMMUNITY)
Admission: RE | Admit: 2015-01-08 | Discharge: 2015-01-08 | Disposition: A | Discharge status: Home / Self Care | Payer: Medicare Other | Source: Ambulatory Visit | Attending: Pulmonary Disease | Admitting: Pulmonary Disease

## 2015-01-08 DIAGNOSIS — J438 Other emphysema: Secondary | ICD-10-CM | POA: Diagnosis not present

## 2015-01-10 ENCOUNTER — Encounter (HOSPITAL_COMMUNITY)
Admission: RE | Admit: 2015-01-10 | Discharge: 2015-01-10 | Disposition: A | Discharge status: Home / Self Care | Payer: Medicare Other | Source: Ambulatory Visit | Attending: Pulmonary Disease | Admitting: Pulmonary Disease

## 2015-01-10 DIAGNOSIS — J438 Other emphysema: Secondary | ICD-10-CM | POA: Diagnosis not present

## 2015-01-11 ENCOUNTER — Ambulatory Visit (INDEPENDENT_AMBULATORY_CARE_PROVIDER_SITE_OTHER): Payer: Medicare Other | Admitting: Cardiovascular Disease

## 2015-01-11 ENCOUNTER — Encounter: Payer: Self-pay | Admitting: Cardiovascular Disease

## 2015-01-11 VITALS — BP 124/70 | HR 82 | Ht 73.5 in | Wt 168.0 lb

## 2015-01-11 DIAGNOSIS — E785 Hyperlipidemia, unspecified: Secondary | ICD-10-CM | POA: Diagnosis not present

## 2015-01-11 DIAGNOSIS — I2510 Atherosclerotic heart disease of native coronary artery without angina pectoris: Secondary | ICD-10-CM | POA: Diagnosis not present

## 2015-01-11 DIAGNOSIS — I495 Sick sinus syndrome: Secondary | ICD-10-CM

## 2015-01-11 DIAGNOSIS — I10 Essential (primary) hypertension: Secondary | ICD-10-CM | POA: Diagnosis not present

## 2015-01-11 DIAGNOSIS — I4891 Unspecified atrial fibrillation: Secondary | ICD-10-CM

## 2015-01-11 DIAGNOSIS — J449 Chronic obstructive pulmonary disease, unspecified: Secondary | ICD-10-CM

## 2015-01-11 DIAGNOSIS — Z7901 Long term (current) use of anticoagulants: Secondary | ICD-10-CM

## 2015-01-11 DIAGNOSIS — Z5181 Encounter for therapeutic drug level monitoring: Secondary | ICD-10-CM

## 2015-01-11 MED ORDER — ATORVASTATIN CALCIUM 20 MG PO TABS: 20.0000 mg | ORAL_TABLET | Freq: Every day | ORAL | Status: DC

## 2015-01-11 MED ORDER — RIVAROXABAN 20 MG PO TABS: 20.0000 mg | ORAL_TABLET | Freq: Every day | ORAL | Status: DC

## 2015-01-11 NOTE — Patient Instructions (Signed)
   Refilled your Xarelto today - sent to [facility] mail order.  [facility] Continue all other medications.   Your physician wants you to follow up in: 6 months.  You will receive a reminder letter in the mail one-two months in advance.  If you don't receive a letter, please call our office to schedule the follow up appointment

## 2015-01-11 NOTE — Progress Notes (Signed)
Patient ID: LOIS OSTROM, male   DOB: 09-21-1934, 80 y.o.   MRN: [id number]      SUBJECTIVE: The patient presents for routine cardiovascular follow up. In summary, he is a 80 year old male with a history of atrial fibrillation, hypertension, hyperlipidemia, coronary artery disease, and has a pacemaker for tachycardia-bradycardia syndrome.  He also has COPD and hypothyroidism.  Lexiscan Cardiolite stress test in 10/2013 demonstrated a fixed inferolateral defect which was consistent with myocardial scar versus attenuation artifact.  PFTs demonstrated moderate airflow obstruction with moderate to severely reduced DLCO. He sees a pulmonologist.  He underwent coronary angiography on 07/06/14 which demonstrated moderate coronary disease with diffuse multifocal disease in the 50-60% range throughout the mid and distal LAD. There was noted to be slight progression since his prior study in 2012. There was total occlusion of the first obtuse marginal with left to left collaterals and nonobstructive disease in the RCA. Overall, the LAD had not significantly progressed. Left ventriculography showed normal left ventricular systolic function with mid to distal anterolateral wall motion abnormalities likely related to occlusion of the first marginal branch. LVEF 60%.   It was not felt that his chest discomfort was ischemia related.  He has been using inhalers which has led to some hoarseness of voice. He denies exertional chest discomfort. His appetite has improved and he has put on some weight. He does some yard work and some work around [employer]. He is here with his wife.  His daughters are at the beach.  Soc: Married. Daughter [name]) moved to [place] from [place]. Daughter (Dr. Augustina Mood, dentist) lives in [place]. Son [name] also lives in [place].   Review of Systems: As per "subjective", otherwise negative.  Allergies  Allergen Reactions  . Nsaids     colitis    Current Outpatient Prescriptions    Medication Sig Dispense Refill  . acetaminophen (TYLENOL) 500 MG tablet Take 500 mg by mouth every 6 (six) hours as needed for mild pain (takes as needed for arthritis pain).    . Cholecalciferol (VITAMIN D) 2000 UNITS CAPS Take 1 capsule by mouth daily with supper.     . docusate sodium (COLACE) 100 MG capsule Take 2 capsules (200 mg total) by mouth daily. 1 capsule 0  . isosorbide mononitrate (IMDUR) 30 MG 24 hr tablet Take 1 tablet (30 mg total) by mouth 2 (two) times daily. 180 tablet 3  . ketorolac (ACULAR) 0.5 % ophthalmic solution Place 1 drop into the left eye 3 (three) times daily.     Marland Kitchen levothyroxine (SYNTHROID, LEVOTHROID) 50 MCG tablet Take 50 mcg by mouth daily before breakfast.     . loratadine (CLARITIN) 10 MG tablet Take 10 mg by mouth daily.    . mesalamine (ASACOL) 400 MG EC tablet Take 800 mg by mouth 2 (two) times daily.    . metoprolol (LOPRESSOR) 50 MG tablet TAKE ONE TABLET BY MOUTH TWICE DAILY 60 tablet 6  . mometasone-formoterol (DULERA) 100-5 MCG/ACT AERO Inhale 2 puffs into the lungs 2 (two) times daily. 3 Inhaler 3  . nitroGLYCERIN (NITROSTAT) 0.4 MG SL tablet Place 1 tablet (0.4 mg total) under the tongue every 5 (five) minutes as needed for chest pain. 25 tablet 3  . prednisoLONE 5 MG TABS tablet Take 5 mg by mouth daily.    . rivaroxaban (XARELTO) 20 MG TABS tablet Take 1 tablet (20 mg total) by mouth daily with supper. 90 tablet 3   No current facility-administered medications for this visit.  Past Medical History  Diagnosis Date  . Hypertension   . Dyslipidemia   . Atrial fibrillation   . CAD (coronary artery disease)     a.  cath 5/12: mLAD 50% then tandem 50%, dLAD 30%; D1 30%; OM1 occluded with collateral flow; pRCA 25%; EF 55% with inf HK;  occluded OM likely cause of perfusion defect on Myoview  . Tachy-brady syndrome     s/p pacer  . Ulcerative colitis   . Dizziness   . Nonspecific abnormal unspecified cardiovascular function study   . GERD  (gastroesophageal reflux disease)   . Arthritis   . Arrhythmia   . Pelvic fracture   . Hyperlipidemia   . Depression   . Anxiety   . Gout   . Dementia     short term  . Skin disorder     blister formation  . Hearing disorder   . Hypothyroidism   . COPD (chronic obstructive pulmonary disease)   . Colitis   . Pacemaker     Past Surgical History  Procedure Laterality Date  . Implantation of a medtronic dual chamber pacemaker  02/01/06    [name] [name] MD  . Left knee arthroscopy with debridement  04/23/2006    [name] M.D  . Left knee total knee  replacement  05/13/07    [name] M.D.  . Knee surgery  2005    replacement  . Back surgery  1975  . Lumbar spine surgery    . Insert / replace / remove pacemaker      MEDTRONIC....PPM Serial Number:  [id number].....Marland KitchenPPM Model Number:  [id number]....Marland KitchenPPM DOI:  02/01/2006    . Cardiac catheterization  09/20/10    EF 55%.....Dr. Percival Spanish  . Left heart catheterization with coronary angiogram N/A 07/06/2014    Procedure: LEFT HEART CATHETERIZATION WITH CORONARY ANGIOGRAM;  Surgeon: Sinclair Grooms, MD;  Location: [facility] CATH LAB;  Service: Cardiovascular;  Laterality: N/A;  . Cataract extraction Right   . Cataract extraction w/phaco Left 12/17/2014    Procedure: CATARACT EXTRACTION PHACO AND INTRAOCULAR LENS PLACEMENT (IOC);  Surgeon: Tonny Branch, MD;  Location: [facility]S;  Service: Ophthalmology;  Laterality: Left;  CDE 7.44    Social History   Social History  . Marital Status: Married    Spouse Name: [name]  . Number of Children: 3  . Years of Education: 12   Occupational History  . retired    Social History Main Topics  . Smoking status: Former Smoker -- 1.00 packs/day for 15 years    Types: Cigarettes    Start date: 05/13/1955    Quit date: 05/12/1983  . Smokeless tobacco: Never Used  . Alcohol Use: No  . Drug Use: No  . Sexual Activity: Yes    Birth Control/ Protection: None   Other Topics Concern  . Not  on file   Social History Narrative   One cup of caffeine daily     Filed Vitals:   01/11/15 1447  BP: 124/70  Pulse: 82  Height: 6' 1.5" (1.867 m)  Weight: 168 lb (76.204 kg)  SpO2: 94%    PHYSICAL EXAM General: NAD HEENT: Normal. Neck: No JVD, no thyromegaly. Lungs: No rales or wheezes. CV: Nondisplaced PMI. Irregular rhythm, normal rate, normal S1/S2, no S3, no murmur. No pretibial or periankle edema.  Abdomen: Soft, nontender, no distention.  Neurologic: Alert and oriented x 3.  Psych: Normal affect. Skin: Normal. Musculoskeletal: No gross deformities. Extremities: No clubbing or cyanosis.  ECG: Most recent ECG reviewed.      ASSESSMENT AND PLAN: 1. CAD: Cath results noted above. Continue metoprolol. Will start Lipitor 20 mg. Encouraged to inform me if he develops muscle weakness or cramping.  2. Atrial fibrillation: Device interrogation on 10/09/14 demonstrated normal device function and with 40.4% mode switching with no high ventricular rates. Continue metoprolol and Xarelto.  3. Essential HTN: Controlled on current therapy. No changes.  4. Pacemaker: Device interrogation on 10/09/14 demonstrated normal device function and with 40.4% mode switching with no high ventricular rates. Follows with Dr. Rayann Heman.  5. Hyperlipidemia: Will start Lipitor 20 mg. Encouraged to inform me if he develops muscle weakness or cramping.  6. COPD: Stable. Followed by pulmonary.  Dispo: f/u 6 months.  Kate Sable, M.D., F.A.C.C.

## 2015-01-15 ENCOUNTER — Encounter (HOSPITAL_COMMUNITY): Payer: Medicare Other

## 2015-01-17 ENCOUNTER — Encounter (HOSPITAL_COMMUNITY): Payer: Medicare Other

## 2015-01-21 ENCOUNTER — Emergency Department (HOSPITAL_COMMUNITY)
Admission: EM | Admit: 2015-01-21 | Discharge: 2015-01-21 | Disposition: A | Discharge status: Home / Self Care | Payer: Medicare Other | Attending: Emergency Medicine | Admitting: Emergency Medicine

## 2015-01-21 ENCOUNTER — Telehealth: Payer: Self-pay | Admitting: Adult Health

## 2015-01-21 ENCOUNTER — Encounter (HOSPITAL_COMMUNITY): Payer: Self-pay | Admitting: Emergency Medicine

## 2015-01-21 DIAGNOSIS — I2510 Atherosclerotic heart disease of native coronary artery without angina pectoris: Secondary | ICD-10-CM | POA: Insufficient documentation

## 2015-01-21 DIAGNOSIS — Z79899 Other long term (current) drug therapy: Secondary | ICD-10-CM | POA: Diagnosis not present

## 2015-01-21 DIAGNOSIS — R040 Epistaxis: Secondary | ICD-10-CM | POA: Diagnosis not present

## 2015-01-21 DIAGNOSIS — Z9889 Other specified postprocedural states: Secondary | ICD-10-CM | POA: Diagnosis not present

## 2015-01-21 DIAGNOSIS — E785 Hyperlipidemia, unspecified: Secondary | ICD-10-CM | POA: Insufficient documentation

## 2015-01-21 DIAGNOSIS — M109 Gout, unspecified: Secondary | ICD-10-CM | POA: Insufficient documentation

## 2015-01-21 DIAGNOSIS — F329 Major depressive disorder, single episode, unspecified: Secondary | ICD-10-CM | POA: Insufficient documentation

## 2015-01-21 DIAGNOSIS — E039 Hypothyroidism, unspecified: Secondary | ICD-10-CM | POA: Diagnosis not present

## 2015-01-21 DIAGNOSIS — Z7901 Long term (current) use of anticoagulants: Secondary | ICD-10-CM | POA: Diagnosis not present

## 2015-01-21 DIAGNOSIS — Z87891 Personal history of nicotine dependence: Secondary | ICD-10-CM | POA: Diagnosis not present

## 2015-01-21 DIAGNOSIS — I10 Essential (primary) hypertension: Secondary | ICD-10-CM | POA: Diagnosis not present

## 2015-01-21 DIAGNOSIS — J449 Chronic obstructive pulmonary disease, unspecified: Secondary | ICD-10-CM | POA: Insufficient documentation

## 2015-01-21 DIAGNOSIS — Z8781 Personal history of (healed) traumatic fracture: Secondary | ICD-10-CM | POA: Diagnosis not present

## 2015-01-21 DIAGNOSIS — K219 Gastro-esophageal reflux disease without esophagitis: Secondary | ICD-10-CM | POA: Diagnosis not present

## 2015-01-21 DIAGNOSIS — F419 Anxiety disorder, unspecified: Secondary | ICD-10-CM | POA: Diagnosis not present

## 2015-01-21 DIAGNOSIS — Z7952 Long term (current) use of systemic steroids: Secondary | ICD-10-CM | POA: Insufficient documentation

## 2015-01-21 DIAGNOSIS — Z950 Presence of cardiac pacemaker: Secondary | ICD-10-CM | POA: Diagnosis not present

## 2015-01-21 DIAGNOSIS — F0390 Unspecified dementia without behavioral disturbance: Secondary | ICD-10-CM | POA: Diagnosis not present

## 2015-01-21 DIAGNOSIS — M1990 Unspecified osteoarthritis, unspecified site: Secondary | ICD-10-CM | POA: Diagnosis not present

## 2015-01-21 DIAGNOSIS — R030 Elevated blood-pressure reading, without diagnosis of hypertension: Secondary | ICD-10-CM | POA: Diagnosis not present

## 2015-01-21 DIAGNOSIS — I4891 Unspecified atrial fibrillation: Secondary | ICD-10-CM | POA: Insufficient documentation

## 2015-01-21 LAB — BASIC METABOLIC PANEL
ANION GAP: 7 (ref 5–15)
BUN: 23 mg/dL — ABNORMAL HIGH (ref 6–20)
CHLORIDE: 106 mmol/L (ref 101–111)
CO2: 27 mmol/L (ref 22–32)
Calcium: 8.7 mg/dL — ABNORMAL LOW (ref 8.9–10.3)
Creatinine, Ser: 0.88 mg/dL (ref 0.61–1.24)
GFR calc Af Amer: >60 mL/min (ref >60)
GFR calc non Af Amer: >60 mL/min (ref >60)
Glucose, Bld: 90 mg/dL (ref 65–99)
POTASSIUM: 4.2 mmol/L (ref 3.5–5.1)
SODIUM: 140 mmol/L (ref 135–145)

## 2015-01-21 LAB — CBC WITH DIFFERENTIAL/PLATELET
BASOS ABS: 0 10*3/uL (ref 0.0–0.1)
Basophils Relative: 0 % (ref 0–1)
EOS ABS: 0.2 10*3/uL (ref 0.0–0.7)
Eosinophils Relative: 2 % (ref 0–5)
HCT: 40.9 % (ref 39.0–52.0)
Hemoglobin: 13.4 g/dL (ref 13.0–17.0)
Lymphocytes Relative: 21 % (ref 12–46)
Lymphs Abs: 2.1 10*3/uL (ref 0.7–4.0)
MCH: 29.8 pg (ref 26.0–34.0)
MCHC: 32.8 g/dL (ref 30.0–36.0)
MCV: 91.1 fL (ref 78.0–100.0)
Monocytes Absolute: 0.9 10*3/uL (ref 0.1–1.0)
Monocytes Relative: 9 % (ref 3–12)
NEUTROS PCT: 68 % (ref 43–77)
Neutro Abs: 6.8 10*3/uL (ref 1.7–7.7)
Platelets: 172 10*3/uL (ref 150–400)
RBC: 4.49 MIL/uL (ref 4.22–5.81)
RDW: 15.1 % (ref 11.5–15.5)
WBC: 10.1 10*3/uL (ref 4.0–10.5)

## 2015-01-21 MED ORDER — SILVER NITRATE-POT NITRATE 75-25 % EX MISC
1.0000 | Freq: Once | CUTANEOUS | Status: AC | PRN
  Administered 2015-01-21: 1 via TOPICAL
  Filled 2015-01-21: qty 1

## 2015-01-21 MED ORDER — OXYMETAZOLINE HCL 0.05 % NA SOLN
1.0000 | Freq: Once | NASAL | Status: AC | PRN
  Administered 2015-01-21: 1 via NASAL
  Filled 2015-01-21: qty 15

## 2015-01-21 NOTE — Telephone Encounter (Signed)
Patient is in ER with epistaxis. His wife asked if he could stop Xarelto as this may be contributing to this. I have reviewed his chart and do not recommend that this be stopped unless his H/H or renal function is deteriorating as a result of his epistaxis.   CHADS VASC Score 4, with high risk for CVA. He will follow up with Dr. Bronson Ing to discuss further.

## 2015-01-21 NOTE — ED Notes (Signed)
Nose bleed started one month ago and it stopped.  Had several nose bleeds in last week.  This morning nose bleed started and did not stop.  Bleeding is to right nares, like a runny nose.  Denies any pain or discomfort.

## 2015-01-21 NOTE — ED Notes (Signed)
No nasal bleeding per pt.  Continues to have blood draining in back of throat.

## 2015-01-21 NOTE — Discharge Instructions (Signed)

## 2015-01-21 NOTE — ED Provider Notes (Signed)
CSN: [id number]     Arrival date & time 01/21/15  0806 History   First MD Initiated Contact with Patient 01/21/15 [phone]     Chief Complaint  Patient presents with  . Epistaxis     (Consider location/radiation/quality/duration/timing/severity/associated sxs/prior Treatment) Patient is a 80 y.o. male presenting with nosebleeds. The history is provided by the patient and the spouse.  Epistaxis Associated symptoms: no congestion, no dizziness, no fever, no headaches and no sore throat    ANDREE GOLPHIN is a 80 y.o. male on Xarelto for atrial fibrillation presenting with a one-month history of intermittent nosebleeds.  Over the past week they have become more frequent.  He woke this morning with right nasal bleeding along with expectoration of blood clots.  He has applied pressure to the nose with head tilted back without improvement in bleeding.  He denies extensive bruising, also denies weakness, dizziness headache, chest pain or palpitations.  He has applied pressure to his nose without any relief of bleeding which he describes as a constant trickle of blood.     Past Medical History  Diagnosis Date  . Hypertension   . Dyslipidemia   . Atrial fibrillation   . CAD (coronary artery disease)     a.  cath 5/12: mLAD 50% then tandem 50%, dLAD 30%; D1 30%; OM1 occluded with collateral flow; pRCA 25%; EF 55% with inf HK;  occluded OM likely cause of perfusion defect on Myoview  . Tachy-brady syndrome     s/p pacer  . Ulcerative colitis   . Dizziness   . Nonspecific abnormal unspecified cardiovascular function study   . GERD (gastroesophageal reflux disease)   . Arthritis   . Arrhythmia   . Pelvic fracture   . Hyperlipidemia   . Depression   . Anxiety   . Gout   . Dementia     short term  . Skin disorder     blister formation  . Hearing disorder   . Hypothyroidism   . COPD (chronic obstructive pulmonary disease)   . Colitis   . Pacemaker    Past Surgical History  Procedure  Laterality Date  . Implantation of a medtronic dual chamber pacemaker  02/01/06    [name] [name] MD  . Left knee arthroscopy with debridement  04/23/2006    [name] M.D  . Left knee total knee  replacement  05/13/07    [name] M.D.  . Knee surgery  2005    replacement  . Back surgery  1975  . Lumbar spine surgery    . Insert / replace / remove pacemaker      MEDTRONIC....PPM Serial Number:  [id number].....Marland KitchenPPM Model Number:  [id number]....Marland KitchenPPM DOI:  02/01/2006    . Cardiac catheterization  09/20/10    EF 55%.....Dr. Percival Spanish  . Left heart catheterization with coronary angiogram N/A 07/06/2014    Procedure: LEFT HEART CATHETERIZATION WITH CORONARY ANGIOGRAM;  Surgeon: Sinclair Grooms, MD;  Location: [facility] CATH LAB;  Service: Cardiovascular;  Laterality: N/A;  . Cataract extraction Right   . Cataract extraction w/phaco Left 12/17/2014    Procedure: CATARACT EXTRACTION PHACO AND INTRAOCULAR LENS PLACEMENT (IOC);  Surgeon: Tonny Branch, MD;  Location: [facility] ORS;  Service: Ophthalmology;  Laterality: Left;  CDE 7.44   Family History  Problem Relation Age of Onset  . Lung disease Father     Emphysema  . Hypertension Mother   . Liver cancer Mother   . Pernicious anemia Mother   .  Asthma Sister   . Healthy Daughter   . Healthy Daughter   . Healthy Son   . Hypertension Father   . Peptic Ulcer Disease Father   . COPD Father   . Depression Mother   . Kidney disease Mother   . Colitis Mother   . COPD Sister   . Glaucoma Sister   . Hypertension Sister   . Cancer Sister    Social History  Substance Use Topics  . Smoking status: Former Smoker -- 1.00 packs/day for 15 years    Types: Cigarettes    Start date: 05/13/1955    Quit date: 05/12/1983  . Smokeless tobacco: Never Used  . Alcohol Use: No    Review of Systems  Constitutional: Negative for fever.  HENT: Positive for nosebleeds. Negative for congestion and sore throat.   Eyes: Negative.   Respiratory: Negative for  chest tightness and shortness of breath.   Cardiovascular: Negative for chest pain.  Gastrointestinal: Negative for nausea and abdominal pain.  Genitourinary: Negative.   Musculoskeletal: Negative for joint swelling, arthralgias and neck pain.  Skin: Negative.  Negative for rash and wound.  Neurological: Negative for dizziness, weakness, light-headedness, numbness and headaches.  Psychiatric/Behavioral: Negative.       Allergies  Nsaids  Home Medications   Prior to Admission medications   Medication Sig Start Date End Date Taking? Authorizing Provider  atorvastatin (LIPITOR) 20 MG tablet Take 1 tablet (20 mg total) by mouth daily. 01/11/15  Yes Herminio Commons, MD  Cholecalciferol (VITAMIN D) 2000 UNITS CAPS Take 1 capsule by mouth daily with supper.    Yes Historical Provider, MD  docusate sodium (COLACE) 100 MG capsule Take 2 capsules (200 mg total) by mouth daily. 12/18/14  Yes Rogene Houston, MD  isosorbide mononitrate (IMDUR) 30 MG 24 hr tablet Take 1 tablet (30 mg total) by mouth 2 (two) times daily. 06/19/14  Yes Herminio Commons, MD  ketorolac (ACULAR) 0.5 % ophthalmic solution Place 1 drop into the left eye 3 (three) times daily.  12/17/14  Yes Historical Provider, MD  levothyroxine (SYNTHROID, LEVOTHROID) 50 MCG tablet Take 50 mcg by mouth daily before breakfast.  09/11/13  Yes Historical Provider, MD  loratadine (CLARITIN) 10 MG tablet Take 10 mg by mouth daily.   Yes Historical Provider, MD  mesalamine (ASACOL) 400 MG EC tablet Take 800 mg by mouth 2 (two) times daily.   Yes Historical Provider, MD  metoprolol (LOPRESSOR) 50 MG tablet TAKE ONE TABLET BY MOUTH TWICE DAILY 10/09/14  Yes Herminio Commons, MD  mometasone-formoterol (DULERA) 100-5 MCG/ACT AERO Inhale 2 puffs into the lungs 2 (two) times daily. 10/17/14  Yes Kathee Delton, MD  predniSONE (DELTASONE) 5 MG tablet Take 5 mg by mouth daily with breakfast.   Yes Historical Provider, MD  rivaroxaban (XARELTO) 20 MG TABS  tablet Take 1 tablet (20 mg total) by mouth daily with supper. 01/11/15  Yes Herminio Commons, MD  acetaminophen (TYLENOL) 500 MG tablet Take 500 mg by mouth every 6 (six) hours as needed for mild pain (takes as needed for arthritis pain).    Historical Provider, MD  nitroGLYCERIN (NITROSTAT) 0.4 MG SL tablet Place 1 tablet (0.4 mg total) under the tongue every 5 (five) minutes as needed for chest pain. 06/19/14   Herminio Commons, MD   BP 167/107 mmHg  Pulse 60  Temp(Src) 97.7 F (36.5 C) (Oral)  Resp 18  Ht 6' 2.5" (1.892 m)  Wt 165 lb (  74.844 kg)  BMI 20.91 kg/m2  SpO2 100% Physical Exam  Constitutional: He appears well-developed and well-nourished.  HENT:  Head: Normocephalic and atraumatic.  Nose: Epistaxis is observed.  Small trickle of bleeding from right nostril.  There is no obvious anterior source of bleeding.  Eyes: Conjunctivae are normal.  Neck: Normal range of motion.  Cardiovascular: Normal rate, regular rhythm, normal heart sounds and intact distal pulses.   Pulmonary/Chest: Effort normal and breath sounds normal. He has no wheezes.  Abdominal: Soft. Bowel sounds are normal. There is no tenderness.  Musculoskeletal: Normal range of motion.  Neurological: He is alert.  Skin: Skin is warm and dry.  Psychiatric: He has a normal mood and affect.  Nursing note and vitals reviewed.   ED Course  EPISTAXIS MANAGEMENT Date/Time: 01/21/2015 9:30 AM Performed by: Evalee Jefferson Authorized by: Evalee Jefferson Patient understanding: patient states understanding of the procedure being performed Patient consent: the patient's understanding of the procedure matches consent given Patient identity confirmed: verbally with patient Time out: Immediately prior to procedure a "time out" was called to verify the correct patient, procedure, equipment, support staff and site/side marked as required. Preparation: Patient was prepped and draped in the usual sterile fashion. Patient sedated:  no Treatment site: right posterior Repair method: nasal balloon Post-procedure assessment: bleeding decreased Recurrence: recurrence of recent bleed Comments: Afrin applied, 2 sprays right nostril followed by pressure with no improvement in bleeding prior to insertion of Rhinorocket.    After rhinorocket insertion, pt continued to have a steady drip of blood from around the packing.  The rocket was inflated initially with 2 cc of air, adding additional 2 cc with adequate appearing pressure, but without pain, and no improvement in bleeding.    After discussion with Dr. Venora Maples, rocket removed then attempt to reinsert with spraying afrin on the packing.  Pt did not tolerate insertion of the second packing (both were the 7.5 posterior packing) with severe pain with attempts to insert more than 1/2 way in.  However,  His bleeding at this time also appeared to have stopped completely.  Observation in the department with no further bleeding, also no source of bleeding visualized, nostrils completely clear.  No posterior pharyngeal blood.    (including critical care time) Labs Review Labs Reviewed  BASIC METABOLIC PANEL - Abnormal; Notable for the following:    BUN 23 (*)    Calcium 8.7 (*)    All other components within normal limits  CBC WITH DIFFERENTIAL/PLATELET    Imaging Review No results found. I have personally reviewed and evaluated these images and lab results as part of my medical decision-making.   EKG Interpretation None      MDM   Final diagnoses:  Epistaxis    Pt and wife concerned about his xaralto dose being too high and is asking for reduction or to stop this medicine.  Discussed with Jory Sims, NP with CVD - does not recommend reducing dose unless H/H low or renal function reduced.  Labs checked and stable.  Advised Pt f/u with his cardiologist prn re his Xaralto dosing and alternatives if he continues to have bleeding sx.  He was given afrin for home use,  instructions to return here for any worsened sx, rebleeding that does not stop with Afrin and pressure.    Pt was seen by Dr. Venora Maples prior to dc.   Evalee Jefferson, PA-C 01/21/15 2103  Jola Schmidt, MD 01/22/15 [phone]

## 2015-01-21 NOTE — ED Provider Notes (Signed)
11:49 AM All bleeding stopped. Dc home. Strict return precautions. No change to medications  Jola Schmidt, MD 01/21/15 1149

## 2015-01-22 ENCOUNTER — Encounter (HOSPITAL_COMMUNITY): Payer: Medicare Other

## 2015-01-24 ENCOUNTER — Encounter (HOSPITAL_COMMUNITY): Payer: Medicare Other

## 2015-01-29 ENCOUNTER — Encounter (HOSPITAL_COMMUNITY)
Admission: RE | Admit: 2015-01-29 | Discharge: 2015-01-29 | Disposition: A | Discharge status: Home / Self Care | Payer: Medicare Other | Source: Ambulatory Visit | Attending: Pulmonary Disease | Admitting: Pulmonary Disease

## 2015-01-29 DIAGNOSIS — J438 Other emphysema: Secondary | ICD-10-CM | POA: Diagnosis not present

## 2015-01-31 ENCOUNTER — Ambulatory Visit: Payer: Medicare Other | Admitting: Cardiovascular Disease

## 2015-01-31 ENCOUNTER — Encounter (HOSPITAL_COMMUNITY)
Admission: RE | Admit: 2015-01-31 | Discharge: 2015-01-31 | Disposition: A | Discharge status: Home / Self Care | Payer: Medicare Other | Source: Ambulatory Visit | Attending: Pulmonary Disease | Admitting: Pulmonary Disease

## 2015-01-31 DIAGNOSIS — J438 Other emphysema: Secondary | ICD-10-CM | POA: Diagnosis not present

## 2015-02-05 ENCOUNTER — Encounter (HOSPITAL_COMMUNITY)
Admission: RE | Admit: 2015-02-05 | Discharge: 2015-02-05 | Disposition: A | Discharge status: Home / Self Care | Payer: Medicare Other | Source: Ambulatory Visit | Attending: Pulmonary Disease | Admitting: Pulmonary Disease

## 2015-02-05 DIAGNOSIS — J438 Other emphysema: Secondary | ICD-10-CM | POA: Diagnosis not present

## 2015-02-07 ENCOUNTER — Encounter (HOSPITAL_COMMUNITY)
Admission: RE | Admit: 2015-02-07 | Discharge: 2015-02-07 | Disposition: A | Discharge status: Home / Self Care | Payer: Medicare Other | Source: Ambulatory Visit | Attending: Pulmonary Disease | Admitting: Pulmonary Disease

## 2015-02-07 DIAGNOSIS — J438 Other emphysema: Secondary | ICD-10-CM | POA: Diagnosis not present

## 2015-02-11 ENCOUNTER — Telehealth (INDEPENDENT_AMBULATORY_CARE_PROVIDER_SITE_OTHER): Payer: Self-pay | Admitting: *Deleted

## 2015-02-11 NOTE — Telephone Encounter (Signed)
Progress Report - [name] has some itching beginning in the middle of his back and it is red/rashy. She will continue doing what she is doing now unless she heres from Dr. Laural Golden different. The return phone number is [phone].

## 2015-02-12 ENCOUNTER — Encounter (HOSPITAL_COMMUNITY): Payer: Medicare Other

## 2015-02-13 NOTE — Telephone Encounter (Signed)
Forwarded to Dr.[name] to address.

## 2015-02-14 ENCOUNTER — Encounter (HOSPITAL_COMMUNITY): Payer: Medicare Other

## 2015-02-15 NOTE — Progress Notes (Signed)
Patient is discharged from [facility] program today, February 07, 2015 with 36 sessions.  He achieved LTG of 30 minutes of aerobic exercise at max met level of 3.50.  All patient vitals are WNL.  Patient has not  met with dietician.  Discharge instructions have been reviewed in detail and patient expressed an understanding of material given.  Patient plans to exercise at home. Cardiac Rehab will make 1 month, 6 month and 1 year call backs.  Patient had no complaints of any abnormal S/S or pain on their exit visit.  Patient finished post walk test.

## 2015-02-15 NOTE — Telephone Encounter (Signed)
I spoke with the patient's wife and made her aware.

## 2015-02-15 NOTE — Progress Notes (Signed)
Pulmonary Rehabilitation Program Outcomes Report   Orientation:  08/14/14 Graduate Date:  02/07/15 Discharge Date:  02/07/15 # of sessions completed: 36  Pulmonologist: Clance Family MD:  Lucilla Lame Time:  4739  A.  Exercise Program:  Tolerates exercise @ 3.50 METS for 15 minutes, Walk Test Results:  Post: 3.32 mets, Improved functional capacity  9.57 %, Improved  muscular strength  6.9 % and Decreased dyspnea score 50 %  B.  Mental Health:  Good mental attitude and PHQ-9: 6 at graduation.  0 at entrance.  patient also had hx of dementia. Patient did not want any counseling.  C.  Education/Instruction/Skills  Knows THR for exercise  Uses Perceived Exertion Scale and/or Dyspnea Scale  D.  Nutrition/Weight Control/Body Composition:  Adherence to prescribed nutrition program: good    E.  Blood Lipids   No results found for: CHOL, HDL, LDLCALC, LDLDIRECT, TRIG, CHOLHDL  F.  Lifestyle Changes:  Making positive lifestyle changes  G.  Symptoms noted with exercise:  Asymptomatic  Report Completed By:  Stevphen Rochester rn   Comments:  This is the patients graduation note for [facility]. Patient done very well in the program.

## 2015-02-15 NOTE — Telephone Encounter (Signed)
He needs to follow-up with Dr. Tarri Glenn his dermatologist. In the meantime if he wants to increase prednisone to 10 or 15 mg he can do so. Decision to put him back on methotrexate will be left to his dermatologist.

## 2015-02-19 ENCOUNTER — Encounter (HOSPITAL_COMMUNITY): Payer: Medicare Other

## 2015-02-21 ENCOUNTER — Encounter (HOSPITAL_COMMUNITY): Payer: Medicare Other

## 2015-02-22 DIAGNOSIS — H6121 Impacted cerumen, right ear: Secondary | ICD-10-CM | POA: Diagnosis not present

## 2015-02-22 DIAGNOSIS — R040 Epistaxis: Secondary | ICD-10-CM | POA: Diagnosis not present

## 2015-02-22 DIAGNOSIS — Z7901 Long term (current) use of anticoagulants: Secondary | ICD-10-CM | POA: Diagnosis not present

## 2015-02-22 DIAGNOSIS — I4891 Unspecified atrial fibrillation: Secondary | ICD-10-CM | POA: Diagnosis not present

## 2015-02-26 ENCOUNTER — Encounter (HOSPITAL_COMMUNITY): Payer: Medicare Other

## 2015-02-28 ENCOUNTER — Encounter (HOSPITAL_COMMUNITY): Payer: Medicare Other

## 2015-03-01 ENCOUNTER — Encounter: Payer: Self-pay | Admitting: Cardiovascular Disease

## 2015-03-01 ENCOUNTER — Ambulatory Visit (INDEPENDENT_AMBULATORY_CARE_PROVIDER_SITE_OTHER): Payer: Medicare Other | Admitting: Cardiovascular Disease

## 2015-03-01 VITALS — BP 110/84 | HR 73 | Ht 73.0 in | Wt 165.0 lb

## 2015-03-01 DIAGNOSIS — I10 Essential (primary) hypertension: Secondary | ICD-10-CM

## 2015-03-01 DIAGNOSIS — R5383 Other fatigue: Secondary | ICD-10-CM | POA: Diagnosis not present

## 2015-03-01 DIAGNOSIS — I2510 Atherosclerotic heart disease of native coronary artery without angina pectoris: Secondary | ICD-10-CM | POA: Diagnosis not present

## 2015-03-01 DIAGNOSIS — E785 Hyperlipidemia, unspecified: Secondary | ICD-10-CM | POA: Diagnosis not present

## 2015-03-01 DIAGNOSIS — R079 Chest pain, unspecified: Secondary | ICD-10-CM

## 2015-03-01 DIAGNOSIS — Z950 Presence of cardiac pacemaker: Secondary | ICD-10-CM

## 2015-03-01 DIAGNOSIS — Z5181 Encounter for therapeutic drug level monitoring: Secondary | ICD-10-CM

## 2015-03-01 DIAGNOSIS — I4891 Unspecified atrial fibrillation: Secondary | ICD-10-CM

## 2015-03-01 DIAGNOSIS — Z7901 Long term (current) use of anticoagulants: Secondary | ICD-10-CM

## 2015-03-01 DIAGNOSIS — I25118 Atherosclerotic heart disease of native coronary artery with other forms of angina pectoris: Secondary | ICD-10-CM

## 2015-03-01 MED ORDER — ATORVASTATIN CALCIUM 20 MG PO TABS: 20.0000 mg | ORAL_TABLET | Freq: Every day | ORAL | Status: DC

## 2015-03-01 NOTE — Patient Instructions (Signed)
   Stop Lipitor x 3 months - call office with update at that time on how doing. Continue all other medications.   Your physician wants you to follow up in: 6 months.  You will receive a reminder letter in the mail one-two months in advance.  If you don't receive a letter, please call our office to schedule the follow up appointment

## 2015-03-01 NOTE — Progress Notes (Signed)
Patient ID: [name], male   DOB: 06/28/1934, 80 y.o.   MRN: [id number]      SUBJECTIVE: The patient presents for routine cardiovascular follow up. In summary, he is a 80 year old male with a history of atrial fibrillation, hypertension, hyperlipidemia, coronary artery disease, and has a pacemaker for tachycardia-bradycardia syndrome.  He also has COPD and hypothyroidism.  Lexiscan Cardiolite stress test in 10/2013 demonstrated a fixed inferolateral defect which was consistent with myocardial scar versus attenuation artifact.  PFTs demonstrated moderate airflow obstruction with moderate to severely reduced DLCO. He sees a pulmonologist.  He underwent coronary angiography on 07/06/14 which demonstrated moderate coronary disease with diffuse multifocal disease in the 50-60% range throughout the mid and distal LAD. There was noted to be slight progression since his prior study in 2012. There was total occlusion of the first obtuse marginal with left to left collaterals and nonobstructive disease in the RCA. Overall, the LAD had not significantly progressed. Left ventriculography showed normal left ventricular systolic function with mid to distal anterolateral wall motion abnormalities likely related to occlusion of the first marginal branch. LVEF 60%.   It was not felt that his chest discomfort was ischemia related.  He was evaluated in the ED for nosebleeds on 9/12. They were concerned about Xarelto and wondered about stopping it. Hemoglobin was stable at 13.4. GFR also normal and greater than 60 mL/min. BUN 23, creatinine 0.88.  He is here with his wife. They had several questions about Xarelto and other anticoagulations like Eliquis. We had a very lengthy discussion about the annual risk of stroke in atrial fibrillation.  He did have a blood vessel in his nose cauterized and he has not had any recent problems. He is using Afrin nasal spray and is now also using a humidifier.  He seldom has  fleeting chest pains lasting for less than 2 minutes, none significant enough to take sublingual nitroglycerin.  He has also been feeling fatigued for the past for 5 months and wonders if any of his medications could be causing it. He also questions whether or not he has depression.   Soc: Married. Daughter [name]) moved to [place] from [place]. Daughter (Dr. Augustina Mood, dentist) lives in [place]. Son [name]) also lives in [place].   Review of Systems: As per "subjective", otherwise negative.  Allergies  Allergen Reactions  . Nsaids     colitis    Current Outpatient Prescriptions  Medication Sig Dispense Refill  . acetaminophen (TYLENOL) 500 MG tablet Take 500 mg by mouth every 6 (six) hours as needed for mild pain (takes as needed for arthritis pain).    Marland Kitchen atorvastatin (LIPITOR) 20 MG tablet Take 1 tablet (20 mg total) by mouth daily. 30 tablet 6  . Cholecalciferol (VITAMIN D) 2000 UNITS CAPS Take 1 capsule by mouth daily with supper.     . docusate sodium (COLACE) 100 MG capsule Take 2 capsules (200 mg total) by mouth daily. 1 capsule 0  . isosorbide mononitrate (IMDUR) 30 MG 24 hr tablet Take 1 tablet (30 mg total) by mouth 2 (two) times daily. 180 tablet 3  . ketorolac (ACULAR) 0.5 % ophthalmic solution Place 1 drop into the left eye 3 (three) times daily.     Marland Kitchen levothyroxine (SYNTHROID, LEVOTHROID) 50 MCG tablet Take 50 mcg by mouth daily before breakfast.     . loratadine (CLARITIN) 10 MG tablet Take 10 mg by mouth daily.    . mesalamine (ASACOL) 400 MG EC tablet Take 800  mg by mouth 2 (two) times daily.    . metoprolol (LOPRESSOR) 50 MG tablet TAKE ONE TABLET BY MOUTH TWICE DAILY 60 tablet 6  . mometasone-formoterol (DULERA) 100-5 MCG/ACT AERO Inhale 2 puffs into the lungs 2 (two) times daily. 3 Inhaler 3  . nitroGLYCERIN (NITROSTAT) 0.4 MG SL tablet Place 1 tablet (0.4 mg total) under the tongue every 5 (five) minutes as needed for chest pain. 25 tablet 3  . predniSONE (DELTASONE)  5 MG tablet Take 5 mg by mouth daily with breakfast.    . rivaroxaban (XARELTO) 20 MG TABS tablet Take 1 tablet (20 mg total) by mouth daily with supper. 90 tablet 3   No current facility-administered medications for this visit.    Past Medical History  Diagnosis Date  . Hypertension   . Dyslipidemia   . Atrial fibrillation ([place])   . CAD (coronary artery disease)     a.  cath 5/12: mLAD 50% then tandem 50%, dLAD 30%; D1 30%; OM1 occluded with collateral flow; pRCA 25%; EF 55% with inf HK;  occluded OM likely cause of perfusion defect on Myoview  . Tachy-brady syndrome ([place])     s/p pacer  . Ulcerative colitis   . Dizziness   . Nonspecific abnormal unspecified cardiovascular function study   . GERD (gastroesophageal reflux disease)   . Arthritis   . Arrhythmia   . Pelvic fracture ([place])   . Hyperlipidemia   . Depression   . Anxiety   . Gout   . Dementia     short term  . Skin disorder     blister formation  . Hearing disorder   . Hypothyroidism   . COPD (chronic obstructive pulmonary disease) ([place])   . Colitis   . Pacemaker     Past Surgical History  Procedure Laterality Date  . Implantation of a medtronic dual chamber pacemaker  02/01/06    [name] [name] MD  . Left knee arthroscopy with debridement  04/23/2006    [name] M.D  . Left knee total knee  replacement  05/13/07    [name] M.D.  . Knee surgery  2005    replacement  . Back surgery  1975  . Lumbar spine surgery    . Insert / replace / remove pacemaker      MEDTRONIC....PPM Serial Number:  [id number].....Marland KitchenPPM Model Number:  [id number]....Marland KitchenPPM DOI:  02/01/2006    . Cardiac catheterization  09/20/10    EF 55%.....Dr. Percival Spanish  . Left heart catheterization with coronary angiogram N/A 07/06/2014    Procedure: LEFT HEART CATHETERIZATION WITH CORONARY ANGIOGRAM;  Surgeon: Sinclair Grooms, MD;  [facility]: [facility] CATH LAB;  Service: Cardiovascular;  Laterality: N/A;  . Cataract extraction Right   .  Cataract extraction w/phaco Left 12/17/2014    Procedure: CATARACT EXTRACTION PHACO AND INTRAOCULAR LENS PLACEMENT (IOC);  Surgeon: Tonny Branch, MD;  Location: [facility] ORS;  Service: Ophthalmology;  Laterality: Left;  CDE 7.44    Social History   Social History  . Marital Status: Married    Spouse Name: [name]  . Number of Children: 3  . Years of Education: 12   Occupational History  . retired    Social History Main Topics  . Smoking status: Former Smoker -- 1.00 packs/day for 15 years    Types: Cigarettes    Start date: 05/13/1955    Quit date: 05/12/1983  . Smokeless tobacco: Never Used  . Alcohol Use: No  . Drug Use: No  .  Sexual Activity: Yes    Birth Control/ Protection: None   Other Topics Concern  . Not on file   Social History Narrative   One cup of caffeine daily     Filed Vitals:   03/01/15 0924  BP: 110/84  Pulse: 73  Height: 6' 1"  (1.854 m)  Weight: 165 lb (74.844 kg)  SpO2: 96%    PHYSICAL EXAM General: NAD HEENT: Normal. Neck: No JVD, no thyromegaly. Lungs: No rales or wheezes. CV: Nondisplaced PMI. Irregular rhythm, normal rate, normal S1/S2, no S3, no murmur. No pretibial or periankle edema.  Abdomen: Soft, nontender, no distention.  Neurologic: Alert and oriented x 3.  Psych: Normal affect. Skin: Normal. Musculoskeletal: No gross deformities. Extremities: No clubbing or cyanosis.   ECG: Most recent ECG reviewed.      ASSESSMENT AND PLAN: 1. CAD: Cath results noted above. Continue metoprolol. Given his symptoms of fatigue/weakness, will try stopping Lipitor 20 mg for 3 months. If symptoms do not improve, will resume it.   2. Atrial fibrillation: Device interrogation on 10/09/14 demonstrated normal device function and with 40.4% mode switching with no high ventricular rates. Continue metoprolol and Xarelto.  3. Essential HTN: Controlled on current therapy. No changes.  4. Pacemaker: Device interrogation on 10/09/14 demonstrated normal  device function and with 40.4% mode switching with no high ventricular rates. Follows with Dr. Rayann Heman.  5. Hyperlipidemia: Given his symptoms of fatigue/weakness, will try stopping Lipitor 20 mg for 3 months. If symptoms do not improve, will resume it.   6. COPD: Stable. Followed by pulmonary.  7. Epistaxis, anticoagulation, and CVA risk: We had a very lengthy discussion about reducing the dose of Xarelto and alternative anticoagulants. I informed them that anticoagulants do not cause spontaneous bleeds but usually uncover an underlying pathology such as a blood vessel which needs to be cauterized (or colonic polyps, malignancies of the bladder, etc).   I again told him about the annual risk of stroke in atrial fibrillation and he certainly has a very high thromboembolic risk without anticoagulation. A CVA can be fatal.   He does not meet criteria for reducing the dose of Xarelto. An alternative anticoagulant such as Eliquis is not likely to cause a significant change with regards to epistaxis. They recently purchased a 32 day supply of Xarelto for over $400 and would prefer to keep taking it for now. I told him that in 3 months if they would like to switch to Eliquis to see if this is any better, I would be willing to do so. For the time being, will continue Xarelto 20 mg daily.  Dispo: f/u 6 months.  Time spent: 40 minutes, of which greater than 50% was spent reviewing symptoms, relevant blood tests and studies, and discussing management plan with the patient.   Kate Sable, M.D., F.A.C.C.

## 2015-03-04 ENCOUNTER — Encounter: Payer: Self-pay | Admitting: Internal Medicine

## 2015-03-04 ENCOUNTER — Ambulatory Visit (INDEPENDENT_AMBULATORY_CARE_PROVIDER_SITE_OTHER): Payer: Medicare Other | Admitting: Internal Medicine

## 2015-03-04 ENCOUNTER — Ambulatory Visit (INDEPENDENT_AMBULATORY_CARE_PROVIDER_SITE_OTHER)
Admission: RE | Admit: 2015-03-04 | Discharge: 2015-03-04 | Disposition: A | Discharge status: Home / Self Care | Payer: Medicare Other | Source: Ambulatory Visit | Attending: Internal Medicine | Admitting: Internal Medicine

## 2015-03-04 VITALS — BP 122/66 | HR 72 | Ht 74.0 in | Wt 165.6 lb

## 2015-03-04 DIAGNOSIS — I482 Chronic atrial fibrillation, unspecified: Secondary | ICD-10-CM

## 2015-03-04 DIAGNOSIS — J449 Chronic obstructive pulmonary disease, unspecified: Secondary | ICD-10-CM

## 2015-03-04 DIAGNOSIS — Z77090 Contact with and (suspected) exposure to asbestos: Secondary | ICD-10-CM

## 2015-03-04 DIAGNOSIS — I2510 Atherosclerotic heart disease of native coronary artery without angina pectoris: Secondary | ICD-10-CM

## 2015-03-04 NOTE — Progress Notes (Signed)
Subjective:    Patient ID: [name], male    DOB: 03-02-1935, 80 y.o.   MRN: [id number]  HPI 09/04/14- Dr [name] Patient comes in today for follow-up of his known COPD. He was started on stiolto at the last visit, and also referred to pulmonary rehabilitation. He has been participating in the program, and also staying on his bronchodilator regimen. He feels that it has helped his breathing, but has not been overwhelming. He denies any significant cough, chest congestion, or mucus.  03/04/15- 80 yoM never smoker followed for COPD, complicated by history of asbestos exposure, CAD/angina/pacemaker, chronic pruritus FOLLOWS FOR: Former [name] pt-completed pulm rehab at [facility] like to go back for another session set if able (? insurance will pay).  Has had flu shot              wife here He feels exercise tolerance and breathing have been stable through the summer with little cough. Has sister with COPD who never smoked. No record of alpha-1 testing. He has a Pro-air rescue inhaler available but rarely uses it. Denies wheeze or phlegm. On maintenance prednisone 5 mg daily for chronic itching.  ROS-see HPI   Negative unless "+" Constitutional:    weight loss, night sweats, fevers, chills, fatigue, lassitude. HEENT:    headaches, difficulty swallowing, tooth/dental problems, sore throat,       sneezing, + itching, ear ache, nasal congestion, post nasal drip, snoring CV:    chest pain, orthopnea, PND, swelling in lower extremities, anasarca,                                                       dizziness, palpitations Resp:   + shortness of breath with exertion or at rest.                productive cough,   non-productive cough, coughing up of blood.              change in color of mucus.  wheezing.   Skin:    rash or lesions. GI:  No-   heartburn, indigestion, abdominal pain, nausea, vomiting,  GU: d MS:   joint pain, stiffness, decreased range of motion, Neuro-     nothing  unusual Psych:  change in mood or affect.  depression or anxiety.   memory loss.     Objective:  OBJ- Physical Exam General- Alert, Oriented, Affect-appropriate, Distress- none acute Skin- rash-none, lesions- none, excoriation- none Lymphadenopathy- none Head- atraumatic            Eyes- Gross vision intact, PERRLA, conjunctivae and secretions clear            Ears- Hearing, canals-normal            Nose- Clear, no-Septal dev, mucus, polyps, erosion, perforation             Throat- Mallampati II , mucosa clear , drainage- none, tonsils- atrophic Neck- flexible , trachea midline, no stridor , thyroid nl, carotid no bruit Chest - symmetrical excursion , unlabored           Heart/CV- RRR , no murmur , no gallop  , no rub, nl s1 s2                           -  JVD- none , edema- none, stasis changes- none, varices- none           Lung- clear to P&A/diminished, wheeze- none, cough- none , dullness-none, rub- none           Chest wall-  + pacemaker left chest Abd-  Br/ Gen/ Rectal- Not done, not indicated Extrem- cyanosis- none, clubbing, none, atrophy- none, strength- nl Neuro- grossly intact to observation         Assessment & Plan:

## 2015-03-04 NOTE — Patient Instructions (Signed)
Order- lab- alpha 1 antitrypsin assay     COPD mixed type              CXR- COPD mixed type               Schedule PFT-   Order- referral to Pulmonary Rehab   At [facility]   (previous participant, asks to repeat)  Please call as needed

## 2015-03-05 ENCOUNTER — Encounter (HOSPITAL_COMMUNITY): Payer: Medicare Other

## 2015-03-06 DIAGNOSIS — Z125 Encounter for screening for malignant neoplasm of prostate: Secondary | ICD-10-CM | POA: Diagnosis not present

## 2015-03-06 DIAGNOSIS — M109 Gout, unspecified: Secondary | ICD-10-CM | POA: Diagnosis not present

## 2015-03-06 DIAGNOSIS — I2510 Atherosclerotic heart disease of native coronary artery without angina pectoris: Secondary | ICD-10-CM | POA: Diagnosis not present

## 2015-03-06 DIAGNOSIS — I10 Essential (primary) hypertension: Secondary | ICD-10-CM | POA: Diagnosis not present

## 2015-03-06 DIAGNOSIS — E039 Hypothyroidism, unspecified: Secondary | ICD-10-CM | POA: Diagnosis not present

## 2015-03-06 DIAGNOSIS — R8299 Other abnormal findings in urine: Secondary | ICD-10-CM | POA: Diagnosis not present

## 2015-03-06 DIAGNOSIS — E785 Hyperlipidemia, unspecified: Secondary | ICD-10-CM | POA: Diagnosis not present

## 2015-03-07 ENCOUNTER — Encounter (HOSPITAL_COMMUNITY): Payer: Medicare Other

## 2015-03-07 DIAGNOSIS — M79672 Pain in left foot: Secondary | ICD-10-CM | POA: Diagnosis not present

## 2015-03-07 DIAGNOSIS — M2041 Other hammer toe(s) (acquired), right foot: Secondary | ICD-10-CM | POA: Diagnosis not present

## 2015-03-07 DIAGNOSIS — M79671 Pain in right foot: Secondary | ICD-10-CM | POA: Diagnosis not present

## 2015-03-07 DIAGNOSIS — L110 Acquired keratosis follicularis: Secondary | ICD-10-CM | POA: Diagnosis not present

## 2015-03-12 ENCOUNTER — Encounter (HOSPITAL_COMMUNITY): Payer: Medicare Other

## 2015-03-12 NOTE — Assessment & Plan Note (Signed)
Occasional surveillance remains appropriate Plan-chest x-ray

## 2015-03-12 NOTE — Assessment & Plan Note (Signed)
Rate control with pacemaker, managed by cardiology

## 2015-03-12 NOTE — Assessment & Plan Note (Signed)
He is motivated and feels it would be very helpful for him to return to pulmonary rehabilitation, perhaps on a maintenance class. We will make referral to the program at [facility]. Lab for alpha-1 antitrypsin determination He will ask his primary physician about his pneumonia vaccine status

## 2015-03-13 DIAGNOSIS — K529 Noninfective gastroenteritis and colitis, unspecified: Secondary | ICD-10-CM | POA: Diagnosis not present

## 2015-03-13 DIAGNOSIS — M109 Gout, unspecified: Secondary | ICD-10-CM | POA: Diagnosis not present

## 2015-03-13 DIAGNOSIS — R809 Proteinuria, unspecified: Secondary | ICD-10-CM | POA: Diagnosis not present

## 2015-03-13 DIAGNOSIS — I10 Essential (primary) hypertension: Secondary | ICD-10-CM | POA: Diagnosis not present

## 2015-03-13 DIAGNOSIS — R209 Unspecified disturbances of skin sensation: Secondary | ICD-10-CM | POA: Diagnosis not present

## 2015-03-13 DIAGNOSIS — E785 Hyperlipidemia, unspecified: Secondary | ICD-10-CM | POA: Diagnosis not present

## 2015-03-13 DIAGNOSIS — I2510 Atherosclerotic heart disease of native coronary artery without angina pectoris: Secondary | ICD-10-CM | POA: Diagnosis not present

## 2015-03-13 DIAGNOSIS — I480 Paroxysmal atrial fibrillation: Secondary | ICD-10-CM | POA: Diagnosis not present

## 2015-03-13 DIAGNOSIS — Z0000 Encounter for general adult medical examination without abnormal findings: Secondary | ICD-10-CM | POA: Diagnosis not present

## 2015-03-13 DIAGNOSIS — R419 Unspecified symptoms and signs involving cognitive functions and awareness: Secondary | ICD-10-CM | POA: Diagnosis not present

## 2015-03-13 DIAGNOSIS — Z7901 Long term (current) use of anticoagulants: Secondary | ICD-10-CM | POA: Diagnosis not present

## 2015-03-13 DIAGNOSIS — Z950 Presence of cardiac pacemaker: Secondary | ICD-10-CM | POA: Diagnosis not present

## 2015-03-13 DIAGNOSIS — Z6821 Body mass index (BMI) 21.0-21.9, adult: Secondary | ICD-10-CM | POA: Diagnosis not present

## 2015-03-14 ENCOUNTER — Encounter: Payer: Self-pay | Admitting: Internal Medicine

## 2015-03-14 ENCOUNTER — Encounter (HOSPITAL_COMMUNITY): Payer: Medicare Other

## 2015-03-15 ENCOUNTER — Encounter: Payer: Self-pay | Admitting: Internal Medicine

## 2015-03-15 ENCOUNTER — Telehealth: Payer: Self-pay | Admitting: Internal Medicine

## 2015-03-15 ENCOUNTER — Ambulatory Visit (INDEPENDENT_AMBULATORY_CARE_PROVIDER_SITE_OTHER): Payer: Medicare Other | Admitting: Internal Medicine

## 2015-03-15 ENCOUNTER — Encounter: Payer: Self-pay | Admitting: *Deleted

## 2015-03-15 VITALS — BP 112/74 | HR 71 | Ht 73.0 in | Wt 168.0 lb

## 2015-03-15 DIAGNOSIS — Z01810 Encounter for preprocedural cardiovascular examination: Secondary | ICD-10-CM

## 2015-03-15 DIAGNOSIS — I25118 Atherosclerotic heart disease of native coronary artery with other forms of angina pectoris: Secondary | ICD-10-CM

## 2015-03-15 DIAGNOSIS — I2510 Atherosclerotic heart disease of native coronary artery without angina pectoris: Secondary | ICD-10-CM | POA: Diagnosis not present

## 2015-03-15 DIAGNOSIS — I480 Paroxysmal atrial fibrillation: Secondary | ICD-10-CM

## 2015-03-15 DIAGNOSIS — I495 Sick sinus syndrome: Secondary | ICD-10-CM

## 2015-03-15 DIAGNOSIS — Z950 Presence of cardiac pacemaker: Secondary | ICD-10-CM

## 2015-03-15 MED ORDER — RIVAROXABAN 20 MG PO TABS: 20.0000 mg | ORAL_TABLET | Freq: Every day | ORAL | Status: DC

## 2015-03-15 NOTE — Telephone Encounter (Signed)
Mrs. Spratt called wanting to find out if [name] can travel to the beach on the day of his procedure with Dr. Rayann Heman.

## 2015-03-15 NOTE — Telephone Encounter (Signed)
Baker Pierini (daughter) called wanting to know would it be better for patient to have a new pacemaker vs having a battery change Family is concerned what is best for the patient.   # [phone]  (daughter)

## 2015-03-15 NOTE — Patient Instructions (Signed)
Your physician recommends that you continue on your current medications as directed. Please refer to the Current Medication list given to you today. You have been scheduled to have your device change out on 04/02/15. Please see the instruction sheet for details.

## 2015-03-15 NOTE — Telephone Encounter (Signed)
Pt has MCR and AARP MCR sup.  No precert required.

## 2015-03-15 NOTE — Telephone Encounter (Signed)
Spoke with wife and informed her that message was sent to doctor for advise about traveling on the day of his device change out and that it would not be addressed before Monday. Wife also informed that patient will be getting his pacemaker changed out which includes the battery. Wife also informed that patient's DPR doesn't give [place] permission to speak with anyone except her. Wife verbalized understanding of plan.

## 2015-03-15 NOTE — Telephone Encounter (Signed)
Pacemaker generator change out with Dr. Rayann Heman on 04/02/15 @3 :00 pm dx: ERI Checking percert

## 2015-03-17 NOTE — Progress Notes (Signed)
PCP: Tivis Ringer, MD Primary Cardiologist:  Dr Bronson Ing  CC; SOB  READ BONELLI is a 80 y.o. male who presents today for routine electrophysiology followup.  Since last being seen in our clinic, the patient reports doing very well.    He is tolerating xarelto without bleeding.  His primary concern is fatigue.  HE has reached ERI battery status and has a little more SOB with this.  He has some dementia and is accompanied by his caregiver today. Today, he denies symptoms of palpitations, chest pain,   lower extremity edema, dizziness, presyncope, or syncope.  The patient is otherwise without complaint today.   Past Medical History  Diagnosis Date  . Hypertension   . Dyslipidemia   . Atrial fibrillation ([place])   . CAD (coronary artery disease)     a.  cath 5/12: mLAD 50% then tandem 50%, dLAD 30%; D1 30%; OM1 occluded with collateral flow; pRCA 25%; EF 55% with inf HK;  occluded OM likely cause of perfusion defect on Myoview  . Tachy-brady syndrome ([place])     s/p pacer  . Ulcerative colitis   . Dizziness   . Nonspecific abnormal unspecified cardiovascular function study   . GERD (gastroesophageal reflux disease)   . Arthritis   . Arrhythmia   . Pelvic fracture ([place])   . Hyperlipidemia   . Depression   . Anxiety   . Gout   . Dementia     short term  . Skin disorder     blister formation  . Hearing disorder   . Hypothyroidism   . COPD (chronic obstructive pulmonary disease) ([place])   . Colitis   . Pacemaker    Past Surgical History  Procedure Laterality Date  . Implantation of a medtronic dual chamber pacemaker  02/01/06    [name] [name] MD  . Left knee arthroscopy with debridement  04/23/2006    [name] M.D  . Left knee total knee  replacement  05/13/07    [name] M.D.  . Knee surgery  2005    replacement  . Back surgery  1975  . Lumbar spine surgery    . Insert / replace / remove pacemaker      MEDTRONIC....PPM Serial Number:   [id number].....Marland KitchenPPM Model Number:  [id number]....Marland KitchenPPM DOI:  02/01/2006    . Cardiac catheterization  09/20/10    EF 55%.....Dr. Percival Spanish  . Left heart catheterization with coronary angiogram N/A 07/06/2014    Procedure: LEFT HEART CATHETERIZATION WITH CORONARY ANGIOGRAM;  Surgeon: Sinclair Grooms, MD;  Location: [facility] CATH LAB;  Service: Cardiovascular;  Laterality: N/A;  . Cataract extraction Right   . Cataract extraction w/phaco Left 12/17/2014    Procedure: CATARACT EXTRACTION PHACO AND INTRAOCULAR LENS PLACEMENT (IOC);  Surgeon: Tonny Branch, MD;  Location: [facility] ORS;  Service: Ophthalmology;  Laterality: Left;  CDE 7.44    Current Outpatient Prescriptions  Medication Sig Dispense Refill  . acetaminophen (TYLENOL) 500 MG tablet Take 500 mg by mouth every 6 (six) hours as needed for mild pain (takes as needed for arthritis pain).    Marland Kitchen atorvastatin (LIPITOR) 20 MG tablet Take 1 tablet (20 mg total) by mouth daily. ( 03/01/15 HOLDING CURRENTLY X 3 MONTHS )    . Cholecalciferol (VITAMIN D) 2000 UNITS CAPS Take 1 capsule by mouth daily with supper.     . docusate sodium (COLACE) 100 MG capsule Take 100 mg by mouth daily as needed for mild constipation.    . isosorbide  mononitrate (IMDUR) 30 MG 24 hr tablet Take 1 tablet (30 mg total) by mouth 2 (two) times daily. 180 tablet 3  . levothyroxine (SYNTHROID, LEVOTHROID) 50 MCG tablet Take 50 mcg by mouth daily before breakfast.     . mesalamine (ASACOL) 400 MG EC tablet Take 800 mg by mouth 2 (two) times daily.    . metoprolol (LOPRESSOR) 50 MG tablet TAKE ONE TABLET BY MOUTH TWICE DAILY 60 tablet 6  . mometasone-formoterol (DULERA) 100-5 MCG/ACT AERO Inhale 2 puffs into the lungs 2 (two) times daily. 3 Inhaler 3  . nitroGLYCERIN (NITROSTAT) 0.4 MG SL tablet Place 1 tablet (0.4 mg total) under the tongue every 5 (five) minutes as needed for chest pain. 25 tablet 3  . predniSONE (DELTASONE) 5 MG tablet Take 5 mg by mouth daily with breakfast.    . rivaroxaban  (XARELTO) 20 MG TABS tablet Take 1 tablet (20 mg total) by mouth daily with supper. 77 tablet 0   No current facility-administered medications for this visit.    Physical Exam: Filed Vitals:   03/15/15 1146  BP: 112/74  Pulse: 71  Height: 6' 1"  (1.854 m)  Weight: 168 lb (76.204 kg)  SpO2: 97%    GEN- The patient is well appearing, alert and oriented x 3 today.   Head- normocephalic, atraumatic Eyes-  Sclera clear, conjunctiva pink Ears- hearing intact Oropharynx- clear Lungs- Clear to ausculation bilaterally, normal work of breathing Chest- pacemaker pocket is well healed Heart- Regular rate and rhythm, no murmurs, rubs or gallops, PMI not laterally displaced GI- soft, NT, ND, + BS Extremities- no clubbing, cyanosis, or edema  Pacemaker interrogation- reviewed in detail today,  See PACEART report  Assessment and Plan:  1. Tachycardia/ Bradycardia He has reached ERI batter status. Risks, benefits, and alternatives to pacemaker pulse generator replacement were discussed in detail today.  The patient understands that risks include but are not limited to bleeding, infection, pneumothorax, perforation, tamponade, vascular damage, renal failure, MI, stroke, death, damage to his existing leads, and lead dislodgement and wishes to proceed.  We will therefore schedule the procedure at the next available time.  He is scheduled for 11/22 for this.  We discussed remote follow-up today.  His caregiver is clear that she would prefer wireless/ RF technology for remotes in the future and thus we discussed switching from MDT to SJM.  2. afib Well controlled with amiodarone I have informed of the importance of following LFTs/ TFTs They prefer to have Dr Quillian Quince do this    3. CAD No ischemic symptoms   Trude Mcburney

## 2015-03-18 ENCOUNTER — Ambulatory Visit: Payer: Medicare Other | Admitting: Cardiovascular Disease

## 2015-03-18 LAB — CUP PACEART INCLINIC DEVICE CHECK
Battery Voltage: 2.61 V
Implantable Lead Implant Date: 20070924
Implantable Lead Location: 753859
Implantable Lead Location: 753860
Implantable Lead Model: 5076
Lead Channel Impedance Value: 418 Ohm
Lead Channel Pacing Threshold Pulse Width: 0.4 ms
Lead Channel Sensing Intrinsic Amplitude: 11.2 mV
Lead Channel Setting Pacing Amplitude: 2.5 V
Lead Channel Setting Pacing Pulse Width: 0.4 ms
MDC IDC LEAD IMPLANT DT: 20070924
MDC IDC MSMT BATTERY IMPEDANCE: 6484 Ohm
MDC IDC MSMT LEADCHNL RA IMPEDANCE VALUE: 67 Ohm
MDC IDC MSMT LEADCHNL RV PACING THRESHOLD AMPLITUDE: 0.75 V
MDC IDC SESS DTM: 20161104161442
MDC IDC SET LEADCHNL RV SENSING SENSITIVITY: 4 mV
MDC IDC STAT BRADY RV PERCENT PACED: 11 %

## 2015-03-19 ENCOUNTER — Encounter (HOSPITAL_COMMUNITY): Payer: Medicare Other

## 2015-03-19 NOTE — Telephone Encounter (Signed)
Claiborne Billings, Please call to address patient's concerns.  Thanks!

## 2015-03-21 ENCOUNTER — Encounter (HOSPITAL_COMMUNITY): Payer: Medicare Other

## 2015-03-25 ENCOUNTER — Encounter: Payer: Self-pay | Admitting: Internal Medicine

## 2015-03-26 DIAGNOSIS — Z960 Presence of urogenital implants: Secondary | ICD-10-CM | POA: Diagnosis not present

## 2015-03-26 DIAGNOSIS — Z01810 Encounter for preprocedural cardiovascular examination: Secondary | ICD-10-CM | POA: Diagnosis not present

## 2015-03-26 DIAGNOSIS — I480 Paroxysmal atrial fibrillation: Secondary | ICD-10-CM | POA: Diagnosis not present

## 2015-03-26 LAB — PROTIME-INR

## 2015-03-26 NOTE — Telephone Encounter (Signed)
Sorry, I closed encounter by mistake.  Forwarding back to [place].

## 2015-03-28 ENCOUNTER — Encounter (HOSPITAL_COMMUNITY)
Admission: RE | Admit: 2015-03-28 | Discharge: 2015-03-28 | Disposition: A | Discharge status: Home / Self Care | Payer: Medicare Other | Source: Ambulatory Visit | Attending: Internal Medicine | Admitting: Internal Medicine

## 2015-03-28 VITALS — BP 130/60 | HR 70 | Ht 74.0 in | Wt 165.0 lb

## 2015-03-28 DIAGNOSIS — J449 Chronic obstructive pulmonary disease, unspecified: Secondary | ICD-10-CM | POA: Insufficient documentation

## 2015-03-28 NOTE — Progress Notes (Addendum)
Patient arrived for 1st visit/orientation/education at 1430. Patient was referred to PR by Dr. Baird Lyons due to COPD J44.9. During orientation advised patient on arrival and appointment times what to wear, what to do before, during and after exercise. Reviewed attendance and class policy. Talked about inclement weather and class consultation policy. Pt is scheduled to return Pulmonary Rehab on 04/23/15 at 10:45 due having a new pacemaker implanted on 04/02/15. Pt was advised to come to class 15 minutes before class starts. Patient was told that he did not have to attend the dietician or Family Structure class since he attended them when he was enrolled in the program before. Pt is eager to get started. Patient was able to complete 6 minute walk test. Patient was measured for the equipment. Discussed equipment safety with patient. Took patient pre-anthropometric measurements. Discussed pursed lip breathing with patient and handouts were given to demonstrate technique. Patient scored 2 on the PHQ-2 and scored 6 on PHQ-9. Discussed counseling with the patient. Patient stated he was just a little down due to not being able to do normal ADL's due to his breathing difficulties. Patient stated he did not feel he needed counseling at this time. He feels he just needs to get moving and exercise. Patient finished visit at 1630.

## 2015-03-28 NOTE — Progress Notes (Signed)
Cardiac/Pulmonary Rehab Medication Review by a Pharmacist  Does the patient  feel that his/her medications are working for him/her?  yes  Has the patient been experiencing any side effects to the medications prescribed?  no  Does the patient measure his/her own blood pressure or blood glucose at home?  yes   Does the patient have any problems obtaining medications due to transportation or finances?   no  Understanding of regimen: good Understanding of indications: good Potential of compliance: good  Questions asked to Determine Patient Understanding of Medication Regimen:  1. What is the name of the medication?  2. What is the medication used for?  3. When should it be taken?  4. How much should be taken?  5. How will you take it?  6. What side effects should you report?  Understanding Defined as: Excellent: All questions above are correct Good: Questions 1-4 are correct Fair: Questions 1-2 are correct  Poor: 1 or none of the above questions are correct   Pharmacist comments: Pt does not c/o any side effects from medications.  Pt does c/o not sleeping well.  Pt states he has dry itchy skin and states he will try Sarna lotion for it.  Pt does check BP occasionally at home.  Pt and wife have concerns about insurance.    Hart Robinsons A 03/28/2015 3:03 PM

## 2015-03-28 NOTE — Patient Instructions (Signed)
Pt has finished orientation/education and is scheduled to start PR on 04/23/15 at 10:45am. Pt has been instructed to arrive to class 15 minutes early for scheduled class. Pt has been instructed to wear comfortable clothing and shoes with rubber soles. Pt has been told to take their medications 1 hour prior to coming to class.  If the patient is not going to attend class, he has been instructed to call.

## 2015-04-01 ENCOUNTER — Telehealth: Payer: Self-pay | Admitting: *Deleted

## 2015-04-01 NOTE — Telephone Encounter (Signed)
Patient informed via message machine.

## 2015-04-01 NOTE — Telephone Encounter (Signed)
-----   Message from Thompson Grayer, MD sent at 03/31/2015  7:06 PM EST ----- Results reviewed Please inform patient of result

## 2015-04-02 ENCOUNTER — Ambulatory Visit (HOSPITAL_COMMUNITY)
Admission: RE | Admit: 2015-04-02 | Discharge: 2015-04-02 | Disposition: A | Discharge status: Home / Self Care | Payer: Medicare Other | Source: Ambulatory Visit | Attending: Internal Medicine | Admitting: Internal Medicine

## 2015-04-02 ENCOUNTER — Encounter (HOSPITAL_COMMUNITY)
Admission: RE | Disposition: A | Discharge status: Home / Self Care | Payer: Self-pay | Source: Ambulatory Visit | Attending: Internal Medicine

## 2015-04-02 DIAGNOSIS — E039 Hypothyroidism, unspecified: Secondary | ICD-10-CM | POA: Insufficient documentation

## 2015-04-02 DIAGNOSIS — Z950 Presence of cardiac pacemaker: Secondary | ICD-10-CM | POA: Diagnosis present

## 2015-04-02 DIAGNOSIS — F0390 Unspecified dementia without behavioral disturbance: Secondary | ICD-10-CM | POA: Diagnosis not present

## 2015-04-02 DIAGNOSIS — K219 Gastro-esophageal reflux disease without esophagitis: Secondary | ICD-10-CM | POA: Insufficient documentation

## 2015-04-02 DIAGNOSIS — E785 Hyperlipidemia, unspecified: Secondary | ICD-10-CM | POA: Diagnosis not present

## 2015-04-02 DIAGNOSIS — I480 Paroxysmal atrial fibrillation: Secondary | ICD-10-CM | POA: Diagnosis not present

## 2015-04-02 DIAGNOSIS — F329 Major depressive disorder, single episode, unspecified: Secondary | ICD-10-CM | POA: Diagnosis not present

## 2015-04-02 DIAGNOSIS — Z7901 Long term (current) use of anticoagulants: Secondary | ICD-10-CM | POA: Insufficient documentation

## 2015-04-02 DIAGNOSIS — I10 Essential (primary) hypertension: Secondary | ICD-10-CM | POA: Insufficient documentation

## 2015-04-02 DIAGNOSIS — F419 Anxiety disorder, unspecified: Secondary | ICD-10-CM | POA: Insufficient documentation

## 2015-04-02 DIAGNOSIS — M1990 Unspecified osteoarthritis, unspecified site: Secondary | ICD-10-CM | POA: Diagnosis not present

## 2015-04-02 DIAGNOSIS — Z45010 Encounter for checking and testing of cardiac pacemaker pulse generator [battery]: Secondary | ICD-10-CM | POA: Insufficient documentation

## 2015-04-02 DIAGNOSIS — M109 Gout, unspecified: Secondary | ICD-10-CM | POA: Insufficient documentation

## 2015-04-02 DIAGNOSIS — I4891 Unspecified atrial fibrillation: Secondary | ICD-10-CM | POA: Diagnosis present

## 2015-04-02 DIAGNOSIS — I495 Sick sinus syndrome: Secondary | ICD-10-CM | POA: Diagnosis not present

## 2015-04-02 DIAGNOSIS — I2510 Atherosclerotic heart disease of native coronary artery without angina pectoris: Secondary | ICD-10-CM | POA: Diagnosis not present

## 2015-04-02 DIAGNOSIS — J449 Chronic obstructive pulmonary disease, unspecified: Secondary | ICD-10-CM | POA: Insufficient documentation

## 2015-04-02 HISTORY — PX: EP IMPLANTABLE DEVICE: SHX172B

## 2015-04-02 LAB — SURGICAL PCR SCREEN
MRSA, PCR: NEGATIVE
STAPHYLOCOCCUS AUREUS: POSITIVE — AB

## 2015-04-02 SURGERY — PPM/BIV PPM GENERATOR CHANGEOUT
Anesthesia: LOCAL

## 2015-04-02 MED ORDER — ONDANSETRON HCL 4 MG/2ML IJ SOLN: 4.0000 mg | Freq: Four times a day (QID) | INTRAMUSCULAR | Status: DC | PRN

## 2015-04-02 MED ORDER — SODIUM CHLORIDE 0.9 % IJ SOLN: 3.0000 mL | Freq: Two times a day (BID) | INTRAMUSCULAR | Status: DC

## 2015-04-02 MED ORDER — LIDOCAINE HCL (PF) 1 % IJ SOLN
INTRAMUSCULAR | Status: AC
  Filled 2015-04-02: qty 30

## 2015-04-02 MED ORDER — FENTANYL CITRATE (PF) 100 MCG/2ML IJ SOLN
INTRAMUSCULAR | Status: AC
  Filled 2015-04-02: qty 2

## 2015-04-02 MED ORDER — SODIUM CHLORIDE 0.9 % IJ SOLN: 3.0000 mL | INTRAMUSCULAR | Status: DC | PRN

## 2015-04-02 MED ORDER — CEFAZOLIN SODIUM-DEXTROSE 2-3 GM-% IV SOLR
INTRAVENOUS | Status: AC
  Filled 2015-04-02: qty 50

## 2015-04-02 MED ORDER — SODIUM CHLORIDE 0.9 % IV SOLN: 250.0000 mL | INTRAVENOUS | Status: DC | PRN

## 2015-04-02 MED ORDER — LIDOCAINE HCL (PF) 1 % IJ SOLN
INTRAMUSCULAR | Status: DC | PRN
  Administered 2015-04-02: 31 mL

## 2015-04-02 MED ORDER — MIDAZOLAM HCL 5 MG/5ML IJ SOLN
INTRAMUSCULAR | Status: AC
  Filled 2015-04-02: qty 5

## 2015-04-02 MED ORDER — ACETAMINOPHEN 325 MG PO TABS: 325.0000 mg | ORAL_TABLET | ORAL | Status: DC | PRN

## 2015-04-02 MED ORDER — SODIUM CHLORIDE 0.9 % IR SOLN
80.0000 mg | Status: AC
  Administered 2015-04-02: 80 mg
  Filled 2015-04-02: qty 2

## 2015-04-02 MED ORDER — SODIUM CHLORIDE 0.9 % IV SOLN
INTRAVENOUS | Status: DC
  Administered 2015-04-02: 14:00:00 via INTRAVENOUS

## 2015-04-02 MED ORDER — CEFAZOLIN SODIUM-DEXTROSE 2-3 GM-% IV SOLR
2.0000 g | INTRAVENOUS | Status: AC
  Administered 2015-04-02: 2 g via INTRAVENOUS

## 2015-04-02 MED ORDER — MUPIROCIN 2 % EX OINT
TOPICAL_OINTMENT | Freq: Two times a day (BID) | CUTANEOUS | Status: DC
  Filled 2015-04-02: qty 22

## 2015-04-02 MED ORDER — CHLORHEXIDINE GLUCONATE 4 % EX LIQD
60.0000 mL | Freq: Once | CUTANEOUS | Status: DC
  Filled 2015-04-02: qty 60

## 2015-04-02 MED ORDER — SODIUM CHLORIDE 0.9 % IR SOLN
Status: AC
  Filled 2015-04-02: qty 2

## 2015-04-02 MED ORDER — MUPIROCIN 2 % EX OINT
TOPICAL_OINTMENT | CUTANEOUS | Status: AC
  Filled 2015-04-02: qty 22

## 2015-04-02 SURGICAL SUPPLY — 4 items
CABLE SURGICAL S-101-97-12 (CABLE) ×1 IMPLANT
PAD DEFIB LIFELINK (PAD) ×1 IMPLANT
PPM ASSURITY DR PM2240 (Pacemaker) ×1 IMPLANT
TRAY PACEMAKER INSERTION (PACKS) ×1 IMPLANT

## 2015-04-02 NOTE — Progress Notes (Signed)
Pt PCR + for staph. Mupiricin given to family along with verbal and written instructions. Pt and family verbalize understanding and deny further questions

## 2015-04-02 NOTE — Progress Notes (Signed)
Dr. Rayann Heman is aware of pt's BP.  He instructed wife to give him another dose of his home BP med tonight.  Wife verbalized understanding.

## 2015-04-02 NOTE — Interval H&P Note (Signed)
History and Physical Interval Note:  04/02/2015 4:14 PM  [name]  has presented today for surgery, with the diagnosis of Tachy/brady  The various methods of treatment have been discussed with the patient and family. After consideration of risks, benefits and other options for treatment, the patient has consented to  Procedure(s):  PPM Generator Changeout (N/A) as a surgical intervention .  The patient's history has been reviewed, patient examined, no change in status, stable for surgery.  I have reviewed the patient's chart and labs.  Questions were answered to the patient's satisfaction.     Thompson Grayer

## 2015-04-02 NOTE — Discharge Instructions (Signed)
Pacemaker Battery Change, Care After Refer to this sheet in the next few weeks. These instructions provide you with information on caring for yourself after your procedure. Your health care provider may also give you more specific instructions. Your treatment has been planned according to current medical practices, but problems sometimes occur. Call your health care provider if you have any problems or questions after your procedure. WHAT TO EXPECT AFTER THE PROCEDURE After your procedure, it is typical to have the following sensations:  Soreness at the pacemaker site. HOME CARE INSTRUCTIONS   Keep the incision clean and dry.  Unless advised otherwise, you may shower beginning 48 hours after your procedure.  For the first week after the replacement, avoid stretching motions that pull at the incision site, and avoid heavy exercise with the arm that is on the same side as the incision.  Take medicines only as directed by your health care provider.  Keep all follow-up visits as directed by your health care provider. SEEK MEDICAL CARE IF:   You have pain at the incision site that is not relieved by over-the-counter or prescription medicine.  There is drainage or pus from the incision site.  There is swelling larger than a lime at the incision site.  You develop red streaking that extends above or below the incision site.  You feel brief, intermittent palpitations, light-headedness, or any symptoms that you feel might be related to your heart. SEEK IMMEDIATE MEDICAL CARE IF:   You experience chest pain that is different than the pain at the pacemaker site.  You experience shortness of breath.  You have palpitations or irregular heartbeat.  You have light-headedness that does not go away quickly.  You faint.  You have pain that gets worse and is not relieved by medicine.   This information is not intended to replace advice given to you by your health care provider. Make sure you  discuss any questions you have with your health care provider.   Document Released: 02/15/2013 Document Revised: 05/18/2014 Document Reviewed: 02/15/2013 Elsevier Interactive Patient Education 2016 [place].      Follow up appointment with Dr. Rayann Heman Dec 2 ,2016 at 1245 in [place]

## 2015-04-02 NOTE — H&P (View-Only) (Signed)
PCP: Tivis Ringer, MD Primary Cardiologist:  Dr Bronson Ing  CC; SOB  FLORENCIO HOLLIBAUGH is a 80 y.o. male who presents today for routine electrophysiology followup.  Since last being seen in our clinic, the patient reports doing very well.    He is tolerating xarelto without bleeding.  His primary concern is fatigue.  HE has reached ERI battery status and has a little more SOB with this.  He has some dementia and is accompanied by his caregiver today. Today, he denies symptoms of palpitations, chest pain,   lower extremity edema, dizziness, presyncope, or syncope.  The patient is otherwise without complaint today.   Past Medical History  Diagnosis Date  . Hypertension   . Dyslipidemia   . Atrial fibrillation ([place])   . CAD (coronary artery disease)     a.  cath 5/12: mLAD 50% then tandem 50%, dLAD 30%; D1 30%; OM1 occluded with collateral flow; pRCA 25%; EF 55% with inf HK;  occluded OM likely cause of perfusion defect on Myoview  . Tachy-brady syndrome ([place])     s/p pacer  . Ulcerative colitis   . Dizziness   . Nonspecific abnormal unspecified cardiovascular function study   . GERD (gastroesophageal reflux disease)   . Arthritis   . Arrhythmia   . Pelvic fracture ([place])   . Hyperlipidemia   . Depression   . Anxiety   . Gout   . Dementia     short term  . Skin disorder     blister formation  . Hearing disorder   . Hypothyroidism   . COPD (chronic obstructive pulmonary disease) ([place])   . Colitis   . Pacemaker    Past Surgical History  Procedure Laterality Date  . Implantation of a medtronic dual chamber pacemaker  02/01/06    [name] [name] MD  . Left knee arthroscopy with debridement  04/23/2006    [name] M.D  . Left knee total knee  replacement  05/13/07    [name] M.D.  . Knee surgery  2005    replacement  . Back surgery  1975  . Lumbar spine surgery    . Insert / replace / remove pacemaker      MEDTRONIC....PPM Serial Number:   [id number].....Marland KitchenPPM Model Number:  [id number]....Marland KitchenPPM DOI:  02/01/2006    . Cardiac catheterization  09/20/10    EF 55%.....Dr. Percival Spanish  . Left heart catheterization with coronary angiogram N/A 07/06/2014    Procedure: LEFT HEART CATHETERIZATION WITH CORONARY ANGIOGRAM;  Surgeon: Sinclair Grooms, MD;  Location: [facility] CATH LAB;  Service: Cardiovascular;  Laterality: N/A;  . Cataract extraction Right   . Cataract extraction w/phaco Left 12/17/2014    Procedure: CATARACT EXTRACTION PHACO AND INTRAOCULAR LENS PLACEMENT (IOC);  Surgeon: Tonny Branch, MD;  Location: [facility] ORS;  Service: Ophthalmology;  Laterality: Left;  CDE 7.44    Current Outpatient Prescriptions  Medication Sig Dispense Refill  . acetaminophen (TYLENOL) 500 MG tablet Take 500 mg by mouth every 6 (six) hours as needed for mild pain (takes as needed for arthritis pain).    Marland Kitchen atorvastatin (LIPITOR) 20 MG tablet Take 1 tablet (20 mg total) by mouth daily. ( 03/01/15 HOLDING CURRENTLY X 3 MONTHS )    . Cholecalciferol (VITAMIN D) 2000 UNITS CAPS Take 1 capsule by mouth daily with supper.     . docusate sodium (COLACE) 100 MG capsule Take 100 mg by mouth daily as needed for mild constipation.    . isosorbide  mononitrate (IMDUR) 30 MG 24 hr tablet Take 1 tablet (30 mg total) by mouth 2 (two) times daily. 180 tablet 3  . levothyroxine (SYNTHROID, LEVOTHROID) 50 MCG tablet Take 50 mcg by mouth daily before breakfast.     . mesalamine (ASACOL) 400 MG EC tablet Take 800 mg by mouth 2 (two) times daily.    . metoprolol (LOPRESSOR) 50 MG tablet TAKE ONE TABLET BY MOUTH TWICE DAILY 60 tablet 6  . mometasone-formoterol (DULERA) 100-5 MCG/ACT AERO Inhale 2 puffs into the lungs 2 (two) times daily. 3 Inhaler 3  . nitroGLYCERIN (NITROSTAT) 0.4 MG SL tablet Place 1 tablet (0.4 mg total) under the tongue every 5 (five) minutes as needed for chest pain. 25 tablet 3  . predniSONE (DELTASONE) 5 MG tablet Take 5 mg by mouth daily with breakfast.    . rivaroxaban  (XARELTO) 20 MG TABS tablet Take 1 tablet (20 mg total) by mouth daily with supper. 77 tablet 0   No current facility-administered medications for this visit.    Physical Exam: Filed Vitals:   03/15/15 1146  BP: 112/74  Pulse: 71  Height: 6' 1"  (1.854 m)  Weight: 168 lb (76.204 kg)  SpO2: 97%    GEN- The patient is well appearing, alert and oriented x 3 today.   Head- normocephalic, atraumatic Eyes-  Sclera clear, conjunctiva pink Ears- hearing intact Oropharynx- clear Lungs- Clear to ausculation bilaterally, normal work of breathing Chest- pacemaker pocket is well healed Heart- Regular rate and rhythm, no murmurs, rubs or gallops, PMI not laterally displaced GI- soft, NT, ND, + BS Extremities- no clubbing, cyanosis, or edema  Pacemaker interrogation- reviewed in detail today,  See PACEART report  Assessment and Plan:  1. Tachycardia/ Bradycardia He has reached ERI batter status. Risks, benefits, and alternatives to pacemaker pulse generator replacement were discussed in detail today.  The patient understands that risks include but are not limited to bleeding, infection, pneumothorax, perforation, tamponade, vascular damage, renal failure, MI, stroke, death, damage to his existing leads, and lead dislodgement and wishes to proceed.  We will therefore schedule the procedure at the next available time.  He is scheduled for 11/22 for this.  We discussed remote follow-up today.  His caregiver is clear that she would prefer wireless/ RF technology for remotes in the future and thus we discussed switching from MDT to SJM.  2. afib Well controlled with amiodarone I have informed of the importance of following LFTs/ TFTs They prefer to have Dr Quillian Quince do this    3. CAD No ischemic symptoms   Trude Mcburney

## 2015-04-02 NOTE — Outcomes Assessment (Signed)
[facility] Pulmonary Rehabilitation Baseline Outcomes Assessment   Anthropometrics:  . Height (inches): 74 . Weight (kg): 74.8 . Grip strength was measured using a Dynamometer.  The patient's highest score was a 76.  Functional Status/Exercise Capacity: . [name] had a resting heart rate of 70 BPM, a resting blood pressure of 130/60, and an oxygen saturation of 96 % on 0 liters of O2.  [name] performed a 6-minute walk test on 03/28/15.  The patient completed 1450 feet in 6 minutes with 0 rest breaks.  This quantifies 2.75 METS.   Dyspnea Measures: . The [facility] is a simple and standardized method of classifying disability in patients with COPD.  The assessment correlates disability and dyspnea.  At entrance the patient scored a 3. The scale is provided below.   0= I only get breathless with strenuous exercise. 1= I get short of breath when hurrying on level ground or walking up a slight incline. 2= On level ground, I walk slower than people of the same age because of breathlessness, or have to stop for breath when walking at my own pace. 3= I stop for breath after walking 100 yards or after a few minutes on level ground. 4=I am too breathless to leave the house or I am breathless when dressing.   . The patient completed the [place] ([facility] [place]).  This questionnaire relates activities of daily living and shortness of breath.  The score ranges from 0-120, a higher score relates to severe shortness of breath during activities of daily living. The patient's score at entrance was 36.  Quality of Life: . Ferrans and Powers Quality of Life Index Pulmonary Version is used to assess the patients satisfaction in different domains of their life; health and functioning, socioeconomic, psychological/spiritual, and family. The overall score is recorded out of 30 points.  The patient's goal is to achieve an overall score of 21 or higher.   [name] received a 17.3 at entrance.  . The Patient Health Questionnaire (PHQ-2) is a first step approach for the screening of depression.  If the patient scores positive on the PHQ-2 the patient should be further assessed with the PHQ-9.  The Patient Health Questionnaire (PHQ-9) assesses the degree of depression.  Depression is important to monitor and track in pulmonary patients due to its prevalence in the population.  If the patient advances to the PHQ-9 the goal is to score less than 4 on this assessment.  [name] scored a 9 at entrance.  Clinical Assessment Tools: . The COPD Assessment Test (CAT) is a measurement tool to quantify how much of an impact the disease has on the patient's life.  This assessment aids the Pulmonary Rehab Team in designing the patients individualized treatment plan.  A CAT score ranges from 0-40.  A score of 10 or below indicates that COPD has a low impact on the patient's life whereas a score of 30 or higher indicates a severe impact. The patient's goal is a decrease of 1 point from entrance to discharge.  [name] had a CAT score of 14 at entrance.  Nutrition: . The "Rate My Plate" is a dietary assessment that quantifies the balance of a patient's diet.  This tool allows the Pulmonary Rehab Team to key in on the areas of the patient's diet that needs improving.  The team can then focus their nutritional education on those areas.  If the patient scores 24-40, this means there are many ways they  can make their eating habits healthier, 41-57 states that there are some ways they can make their eating habits healthier and a score of 58-72 states that they are making many healthy choices.  The patient's goal is to achieve a score of 49 or higher on this assessment.  [name] scored a 26 at entrance.  Oxygen Compliance: . Patient is currently on 0 liters at rest, 0 liters at night, and 0 liters for exercise.  [name] is not currently using a cpap/bipap at night.    Education: . [name] will attend education classes during the course of Pulmonary Rehab.  Education classes that will be offered to the patient are Activities of Daily Living and Energy Conservation, Pursed Lip Breathing and Diaphragmatic Breathing, Nutrition, Exercise for the Pulmonary Patient, Warning Signs of Infection, Chronic Lung Disease, Advanced Directives, Medications, and Stress and Meditation.  The patient completed an assessment at the entrance of the program and will complete it again upon discharge to demonstrate the level of understanding provided by the educational classes.  This assessment includes 14 questions regarding all of the education topics above.  [name] achieved a score of 9/14 at entrance.  Smoking Cessation:  N/A  Exercise:  [name] will be provided with an individualized Home Exercise Prescription (HEP) at the entrance of the program.  The patient will be followed by the Pulmonary Exercise Physiologist throughout the program to assist with the progression of the frequency, intensity, time, and type of exercise. The patient's long-term goal is to be exercising 30-60 minutes, 3-5 days per week. At entrance, the patient was exercising 3 days at home.

## 2015-04-03 ENCOUNTER — Encounter (HOSPITAL_COMMUNITY): Payer: Self-pay | Admitting: Internal Medicine

## 2015-04-08 ENCOUNTER — Telehealth: Payer: Self-pay | Admitting: Internal Medicine

## 2015-04-08 NOTE — Telephone Encounter (Signed)
Will forward to pacer clinic for further advice.

## 2015-04-08 NOTE — Telephone Encounter (Signed)
[name] took his bandage off on Friday from pacemaker. States that it is blackish dark red (very) no pain.  Mr. Stefanski is very concerned.

## 2015-04-12 ENCOUNTER — Ambulatory Visit (INDEPENDENT_AMBULATORY_CARE_PROVIDER_SITE_OTHER): Payer: Medicare Other | Admitting: *Deleted

## 2015-04-12 ENCOUNTER — Encounter: Payer: Self-pay | Admitting: Internal Medicine

## 2015-04-12 DIAGNOSIS — I495 Sick sinus syndrome: Secondary | ICD-10-CM | POA: Diagnosis not present

## 2015-04-12 DIAGNOSIS — Z950 Presence of cardiac pacemaker: Secondary | ICD-10-CM

## 2015-04-12 LAB — CUP PACEART INCLINIC DEVICE CHECK
Battery Remaining Longevity: 128.4
Brady Statistic RA Percent Paced: 0 %
Implantable Lead Implant Date: 20070924
Implantable Lead Location: 753859
Implantable Lead Model: 5076
Lead Channel Pacing Threshold Amplitude: 1 V
Lead Channel Pacing Threshold Pulse Width: 0.4 ms
Lead Channel Sensing Intrinsic Amplitude: 10.2 mV
Lead Channel Setting Pacing Amplitude: 2 V
Lead Channel Setting Pacing Amplitude: 2.5 V
Lead Channel Setting Sensing Sensitivity: 2 mV
MDC IDC LEAD IMPLANT DT: 20070924
MDC IDC LEAD LOCATION: 753860
MDC IDC MSMT BATTERY VOLTAGE: 3.07 V
MDC IDC MSMT LEADCHNL RA IMPEDANCE VALUE: 450 Ohm
MDC IDC MSMT LEADCHNL RA SENSING INTR AMPL: 1.1 mV
MDC IDC MSMT LEADCHNL RV IMPEDANCE VALUE: 400 Ohm
MDC IDC PG SERIAL: 7826824
MDC IDC SESS DTM: 20161202180150
MDC IDC SET LEADCHNL RV PACING PULSEWIDTH: 0.4 ms
MDC IDC STAT BRADY RV PERCENT PACED: 31 %

## 2015-04-16 DIAGNOSIS — L259 Unspecified contact dermatitis, unspecified cause: Secondary | ICD-10-CM | POA: Diagnosis not present

## 2015-04-16 DIAGNOSIS — L570 Actinic keratosis: Secondary | ICD-10-CM | POA: Diagnosis not present

## 2015-04-16 DIAGNOSIS — Z85828 Personal history of other malignant neoplasm of skin: Secondary | ICD-10-CM | POA: Diagnosis not present

## 2015-04-16 NOTE — Progress Notes (Signed)
Wound check appointment after generator change, device checked by industry. Steri-strips removed. Wound without redness or edema, purple to yellow healing ecchymosis below device pocket, JA saw patient, no additional recommendations at this time. Incision edges approximated, incision well healed. Normal device function. Thresholds, sensing, and impedances consistent with implant measurements. Histogram distribution appropriate for patient and level of activity. 3 AMS episodes (AT/AF burden >99%), +Xarelto, some undersensing noted, RA sensitivity reprogrammed from 0.85m to 0.359m Auto mode switch mode reprogrammed from DDIR to DDI. No high ventricular rates noted. Patient educated about wound care, arm mobility, lifting restrictions. [place] 3 [place]

## 2015-04-23 ENCOUNTER — Encounter (HOSPITAL_COMMUNITY)
Admission: RE | Admit: 2015-04-23 | Discharge: 2015-04-23 | Disposition: A | Discharge status: Home / Self Care | Payer: Medicare Other | Source: Ambulatory Visit | Attending: Internal Medicine | Admitting: Internal Medicine

## 2015-04-23 DIAGNOSIS — J449 Chronic obstructive pulmonary disease, unspecified: Secondary | ICD-10-CM | POA: Diagnosis not present

## 2015-04-25 ENCOUNTER — Encounter (HOSPITAL_COMMUNITY)
Admission: RE | Admit: 2015-04-25 | Discharge: 2015-04-25 | Disposition: A | Discharge status: Home / Self Care | Payer: Medicare Other | Source: Ambulatory Visit | Attending: Internal Medicine | Admitting: Internal Medicine

## 2015-04-25 DIAGNOSIS — J449 Chronic obstructive pulmonary disease, unspecified: Secondary | ICD-10-CM | POA: Diagnosis not present

## 2015-04-29 NOTE — Progress Notes (Signed)
Pulmonary Rehabilitation Program Outcomes Report   Orientation:  03/28/15 Graduate Date:  tbd Discharge Date:  tbd # of sessions completed: 3  Pulmonologist: young Family MD:  [name] Class Time:  3785  A.  Exercise Program:  Tolerates exercise @ 3.49 METS for 15 minutes and Walk Test Results:  Pre: 3.11 mets  B.  Mental Health:  Good mental attitude and PHQ-9: 9. Patient feels he does not need counseling.   C.  Education/Instruction/Skills  Accurately checks own pulse.  Rest:  64  Exercise:  110 and Uses Perceived Exertion Scale and/or Dyspnea Scale  Demonstrates accurate pursed lip breathing  D.  Nutrition/Weight Control/Body Composition:  Adherence to prescribed nutrition program: good    E.  Blood Lipids   No results found for: CHOL, HDL, LDLCALC, LDLDIRECT, TRIG, CHOLHDL  F.  Lifestyle Changes:  Making positive lifestyle changes and Not smoking:  Quit 1984  G.  Symptoms noted with exercise:  Asymptomatic  Report Completed By:  [name]   Comments:  This is the patients first week progress note.

## 2015-04-30 ENCOUNTER — Encounter: Payer: Self-pay | Admitting: Internal Medicine

## 2015-04-30 ENCOUNTER — Encounter (HOSPITAL_COMMUNITY)
Admission: RE | Admit: 2015-04-30 | Discharge: 2015-04-30 | Disposition: A | Discharge status: Home / Self Care | Payer: Medicare Other | Source: Ambulatory Visit | Attending: Internal Medicine | Admitting: Internal Medicine

## 2015-04-30 DIAGNOSIS — J449 Chronic obstructive pulmonary disease, unspecified: Secondary | ICD-10-CM | POA: Diagnosis not present

## 2015-05-02 ENCOUNTER — Encounter (HOSPITAL_COMMUNITY)
Admission: RE | Admit: 2015-05-02 | Discharge: 2015-05-02 | Disposition: A | Discharge status: Home / Self Care | Payer: Medicare Other | Source: Ambulatory Visit | Attending: Internal Medicine | Admitting: Internal Medicine

## 2015-05-02 DIAGNOSIS — J449 Chronic obstructive pulmonary disease, unspecified: Secondary | ICD-10-CM | POA: Diagnosis not present

## 2015-05-07 ENCOUNTER — Encounter (HOSPITAL_COMMUNITY)
Admission: RE | Admit: 2015-05-07 | Discharge: 2015-05-07 | Disposition: A | Discharge status: Home / Self Care | Payer: Medicare Other | Source: Ambulatory Visit | Attending: Internal Medicine | Admitting: Internal Medicine

## 2015-05-07 DIAGNOSIS — J449 Chronic obstructive pulmonary disease, unspecified: Secondary | ICD-10-CM | POA: Diagnosis not present

## 2015-05-09 ENCOUNTER — Encounter (HOSPITAL_COMMUNITY)
Admission: RE | Admit: 2015-05-09 | Discharge: 2015-05-09 | Disposition: A | Discharge status: Home / Self Care | Payer: Medicare Other | Source: Ambulatory Visit | Attending: Internal Medicine | Admitting: Internal Medicine

## 2015-05-09 ENCOUNTER — Other Ambulatory Visit: Payer: Self-pay | Admitting: Cardiovascular Disease

## 2015-05-09 DIAGNOSIS — J449 Chronic obstructive pulmonary disease, unspecified: Secondary | ICD-10-CM | POA: Diagnosis not present

## 2015-05-09 MED ORDER — ISOSORBIDE MONONITRATE ER 30 MG PO TB24: 30.0000 mg | ORAL_TABLET | Freq: Two times a day (BID) | ORAL | Status: DC

## 2015-05-09 NOTE — Telephone Encounter (Signed)
Refilled imdur

## 2015-05-09 NOTE — Telephone Encounter (Signed)
°  1. Which medications need to be refilled? (please list name of each medication and dose if known)   Imdur 30 mg   2. Which pharmacy/location (including street and city if local pharmacy) is medication to be sent to?[place], [place]  3. Do they need a 30 day or 90 day supply? [place]

## 2015-05-12 ENCOUNTER — Other Ambulatory Visit (INDEPENDENT_AMBULATORY_CARE_PROVIDER_SITE_OTHER): Payer: Self-pay | Admitting: Internal Medicine

## 2015-05-14 ENCOUNTER — Encounter (HOSPITAL_COMMUNITY)
Admission: RE | Admit: 2015-05-14 | Discharge: 2015-05-14 | Disposition: A | Discharge status: Home / Self Care | Payer: Medicare Other | Source: Ambulatory Visit | Attending: Internal Medicine | Admitting: Internal Medicine

## 2015-05-14 DIAGNOSIS — J449 Chronic obstructive pulmonary disease, unspecified: Secondary | ICD-10-CM | POA: Diagnosis not present

## 2015-05-16 ENCOUNTER — Encounter: Payer: Self-pay | Admitting: Internal Medicine

## 2015-05-16 ENCOUNTER — Encounter (HOSPITAL_COMMUNITY): Payer: Medicare Other

## 2015-05-16 ENCOUNTER — Ambulatory Visit (INDEPENDENT_AMBULATORY_CARE_PROVIDER_SITE_OTHER): Payer: Medicare Other | Admitting: Internal Medicine

## 2015-05-16 VITALS — BP 124/66 | HR 59 | Ht 74.0 in | Wt 161.8 lb

## 2015-05-16 DIAGNOSIS — J449 Chronic obstructive pulmonary disease, unspecified: Secondary | ICD-10-CM

## 2015-05-16 DIAGNOSIS — R0789 Other chest pain: Secondary | ICD-10-CM

## 2015-05-16 MED ORDER — FLUTICASONE-SALMETEROL 250-50 MCG/DOSE IN AEPB: INHALATION_SPRAY | RESPIRATORY_TRACT | Status: DC

## 2015-05-16 NOTE — Patient Instructions (Addendum)
Script for Advair 250 to replace Dulera      Inhale 1 puff, then rinse mouth, twice daily  Ok to keep 4/ 25 appointment with Dr Annamaria Boots and cancel the 1/31 appointment with Dr Elsworth Soho unless you want to handle it differently

## 2015-05-16 NOTE — Progress Notes (Signed)
Subjective:    Patient ID: [name], male    DOB: 10-Aug-1934, 81 y.o.   MRN: [id number]  HPI 09/04/14- Dr [name] Patient comes in today for follow-up of his known COPD. He was started on stiolto at the last visit, and also referred to pulmonary rehabilitation. He has been participating in the program, and also staying on his bronchodilator regimen. He feels that it has helped his breathing, but has not been overwhelming. He denies any significant cough, chest congestion, or mucus.  03/04/15- 80 yoM never smoker followed for COPD, complicated by history of asbestos exposure, CAD/angina/pacemaker, chronic pruritus FOLLOWS FOR: Former [place] pt-completed pulm rehab at [facility] like to go back for another session set if able (? insurance will pay).  Has had flu shot              wife here He feels exercise tolerance and breathing have been stable through the summer with little cough. Has sister with COPD who never smoked. No record of alpha-1 testing. He has a Pro-air rescue inhaler available but rarely uses it. Denies wheeze or phlegm. On maintenance prednisone 5 mg daily for chronic itching.  05/16/15-   81 yoM never smoker followed for COPD, complicated by history of asbestos exposure, CAD/angina/pacemaker, chronic pruritus   wife here  ACUTE VISIT: Chest pain in lung area; had pacemaker checked out 04-02-15 and that was working properly. Will need alternative to inhaler-list attached to last OV notes Describes over several months 2 or 3 episodes of "electric" twinge, very transient, along left lower costal margin. Not recognized as exertional or related to reaching or lifting. He wanted reassurance it was not his heart. Left upper anterior chest wall is still somewhat sore from pacemaker replacement in new pocket in November. The 2 pains seem unrelated. CXR 03/04/15- IMPRESSION: COPD. There is no acute cardiopulmonary abnormality. Electronically Signed  By: David Martinique M.D.  On:  03/04/2015 14:22        ROS-see HPI   Negative unless "+" Constitutional:    weight loss, night sweats, fevers, chills, fatigue, lassitude. HEENT:    headaches, difficulty swallowing, tooth/dental problems, sore throat,       sneezing, + itching, ear ache, nasal congestion, post nasal drip, snoring CV:    chest pain, orthopnea, PND, swelling in lower extremities, anasarca,                                                       dizziness, palpitations Resp:   + shortness of breath with exertion or at rest.                productive cough,   non-productive cough, coughing up of blood.              change in color of mucus.  wheezing.   Skin:    rash or lesions. GI:  No-   heartburn, indigestion, abdominal pain, nausea, vomiting,  GU: d MS:   joint pain, stiffness, decreased range of motion, Neuro-     nothing unusual Psych:  change in mood or affect.  depression or anxiety.   memory loss.     Objective:  OBJ- Physical Exam General- Alert, Oriented, Affect-appropriate, Distress- none acute Skin- rash-none, lesions- none, excoriation- none Lymphadenopathy- none Head- atraumatic  Eyes- Gross vision intact, PERRLA, conjunctivae and secretions clear            Ears- Hearing, canals-normal            Nose- Clear, no-Septal dev, mucus, polyps, erosion, perforation             Throat- Mallampati II , mucosa clear , drainage- none, tonsils- atrophic Neck- flexible , trachea midline, no stridor , thyroid nl, carotid no bruit Chest - symmetrical excursion , unlabored           Heart/CV- RRR , no murmur , no gallop  , no rub, nl s1 s2                           - JVD- none , edema- none, stasis changes- none, varices- none           Lung- clear to P&A/diminished, wheeze- none, cough- none , dullness-none, rub- none           Chest wall-  + pacemaker left chest. Not tender along left lower costal margin Abd-  Br/ Gen/ Rectal- Not done, not indicated Extrem- cyanosis- none, clubbing,  none, atrophy- none, strength- nl Neuro- grossly intact to observation  Assessment & Plan:

## 2015-05-17 MED ORDER — FLUTICASONE-SALMETEROL 250-50 MCG/DOSE IN AEPB: 1.0000 | INHALATION_SPRAY | Freq: Two times a day (BID) | RESPIRATORY_TRACT | Status: DC

## 2015-05-20 NOTE — Assessment & Plan Note (Addendum)
No acute exacerbation. Adequately controlled with current meds as discussed. Plan-because of insurance change we are changing Dulera to Advair 250/50 as discussed

## 2015-05-20 NOTE — Assessment & Plan Note (Signed)
He is describing to chest discomforts at this visit. One is persistent soreness clearly related to his new pacemaker pocket. He will follow up on that as appropriate with his cardiologist.  The discomfort along his left lower costal margin, described as "electric twinge" sounds like a mononeuritis. There may be a little nerve root irritation related to degenerative changes in the thoracic spine. This really does not seem important to him except that he wanted reassurance it was not cardiac.

## 2015-05-21 ENCOUNTER — Encounter (HOSPITAL_COMMUNITY): Payer: Medicare Other

## 2015-05-23 ENCOUNTER — Encounter (HOSPITAL_COMMUNITY)
Admission: RE | Admit: 2015-05-23 | Discharge: 2015-05-23 | Disposition: A | Discharge status: Home / Self Care | Payer: Medicare Other | Source: Ambulatory Visit | Attending: Internal Medicine | Admitting: Internal Medicine

## 2015-05-23 DIAGNOSIS — J449 Chronic obstructive pulmonary disease, unspecified: Secondary | ICD-10-CM | POA: Diagnosis not present

## 2015-05-28 ENCOUNTER — Encounter (HOSPITAL_COMMUNITY)
Admission: RE | Admit: 2015-05-28 | Discharge: 2015-05-28 | Disposition: A | Discharge status: Home / Self Care | Payer: Medicare Other | Source: Ambulatory Visit | Attending: Internal Medicine | Admitting: Internal Medicine

## 2015-05-28 DIAGNOSIS — J449 Chronic obstructive pulmonary disease, unspecified: Secondary | ICD-10-CM | POA: Diagnosis not present

## 2015-05-30 ENCOUNTER — Encounter (HOSPITAL_COMMUNITY)
Admission: RE | Admit: 2015-05-30 | Discharge: 2015-05-30 | Disposition: A | Discharge status: Home / Self Care | Payer: Medicare Other | Source: Ambulatory Visit | Attending: Internal Medicine | Admitting: Internal Medicine

## 2015-05-30 ENCOUNTER — Other Ambulatory Visit (INDEPENDENT_AMBULATORY_CARE_PROVIDER_SITE_OTHER): Payer: Self-pay | Admitting: Internal Medicine

## 2015-05-30 ENCOUNTER — Telehealth: Payer: Self-pay | Admitting: Internal Medicine

## 2015-05-30 DIAGNOSIS — J449 Chronic obstructive pulmonary disease, unspecified: Secondary | ICD-10-CM | POA: Diagnosis not present

## 2015-05-30 MED ORDER — FLUTICASONE-SALMETEROL 115-21 MCG/ACT IN AERO: 2.0000 | INHALATION_SPRAY | Freq: Two times a day (BID) | RESPIRATORY_TRACT | Status: DC

## 2015-05-30 NOTE — Telephone Encounter (Signed)
Spoke with Maudie Mercury at [facility], states that she received a call from patient wife stating that the new inhaler Dr Annamaria Boots gave him is making him hoarse. New inhaler (samples) given at [facility] was Advair 250/50. Pt is requesting an alternative.  Called and spoke with patient wife and she confirmed the above and also stated that he had some Dulera left over and she had him start back on this and when he did the hoarseness improved. Pt wife states that she had brought in a medication formulary from the insurance that was supposed to be placed in the patient's chart. I advised her that I would discuss with Dr Annamaria Boots any alternatives to offer. Please advise if we still have the formulary as I do not see this in her chart. Thanks.  CY-please advise.    Medication List       This list is accurate as of: 05/30/15 11:37 AM.  Always use your most recent med list.               acetaminophen 500 MG tablet  Commonly known as:  TYLENOL  Take 500 mg by mouth every 6 (six) hours as needed for mild pain (takes as needed for arthritis pain).     camphor-menthol lotion  Commonly known as:  SARNA  Apply 1 application topically as needed for itching.     docusate sodium 100 MG capsule  Commonly known as:  COLACE  Take 100 mg by mouth daily as needed for mild constipation.     Fluticasone-Salmeterol 250-50 MCG/DOSE Aepb  Commonly known as:  ADVAIR DISKUS  Inhale 1 puff, then rinse mouth, twice daily     Fluticasone-Salmeterol 250-50 MCG/DOSE Aepb  Commonly known as:  ADVAIR DISKUS  Inhale 1 puff into the lungs 2 (two) times daily.     isosorbide mononitrate 30 MG 24 hr tablet  Commonly known as:  IMDUR  Take 1 tablet (30 mg total) by mouth 2 (two) times daily.     levothyroxine 50 MCG tablet  Commonly known as:  SYNTHROID, LEVOTHROID  Take 50 mcg by mouth daily before breakfast.     mesalamine 400 MG EC tablet  Commonly known as:  ASACOL  Take 800 mg by mouth 2 (two) times daily.     DELZICOL 400 MG  Cpdr DR capsule  Generic drug:  Mesalamine  TAKE TWO CAPSULES BY MOUTH TWICE DAILY     metoprolol 50 MG tablet  Commonly known as:  LOPRESSOR  TAKE ONE TABLET BY MOUTH TWICE DAILY     nitroGLYCERIN 0.4 MG SL tablet  Commonly known as:  NITROSTAT  Place 1 tablet (0.4 mg total) under the tongue every 5 (five) minutes as needed for chest pain.     predniSONE 5 MG tablet  Commonly known as:  DELTASONE  Take 5 mg by mouth daily with breakfast.     rivaroxaban 20 MG Tabs tablet  Commonly known as:  XARELTO  Take 1 tablet (20 mg total) by mouth daily with supper.     traZODone 50 MG tablet  Commonly known as:  DESYREL  Take 1 tablet by mouth at bedtime.     Vitamin D 2000 units Caps  Take 1 capsule by mouth daily with supper.

## 2015-05-30 NOTE — Telephone Encounter (Signed)
It was because of his insurance that we changed from [facility] to [employer], which was preferred by his insurance. It may be the dry powder format he doesn't like.  Try changing to Advair 115/21 HFA inhaler    # 1,  Inhale 2 puffs then rinse mouth, twice daily     Ref prn

## 2015-05-30 NOTE — Progress Notes (Signed)
Patient was given individual home exercise plan. Handout was reviewed an discussed. Patient's long term goals were reassessed. Patient verbalized an understanding.

## 2015-05-30 NOTE — Progress Notes (Signed)
[name] 81 y.o. male  30 day Psychosocial Note  Patient psychosocial assessment reveals no barriers to participation in Pulmonary Rehab.Patient does continue to exhibit positive coping skills. Patient does feel he is making progress toward Pulmonary Rehab goals. Patient reports his health and activity level has improved in the past 30 days as evidenced by patient's report of increased ability to breathe better. Patient states family/friends have not noticed changes in his activity or mood. Patient reports feeling positive about current and projected progression in Pulmonary Rehab. After reviewing the patient's treatment plan, the patient is making progress toward Pulmonary Rehab goals. Patient's rate of progress toward rehab goals is good. Plan of action to help patient continue to work towards rehab goals include education, encouragement and support. Will continue to monitor and evaluate progress toward psychosocial goal(s).  Goal(s) in progress: Help patient work toward returning to meaningful activities that improve patient's QOL and are attainable with patient's lung disease Breathe better Gain stamina

## 2015-05-30 NOTE — Telephone Encounter (Signed)
Spoke with pt's wife and advised of Dr Janee Morn recommendations for pt.  Rx for Advair 115/21 sent to pharmacy.

## 2015-05-31 NOTE — Progress Notes (Addendum)
[name] 81 y.o. male  Initial Psychosocial Assessment  Pt psychosocial assessment reveals pt lives with their spouse. Pt is currently retired. Pt hobbies include building bird houses. Pt reports his stress level is none. Areas of stress/anxiety include none.  Pt does not exhibit signs of depression. Signs of depression include none. Pt shows good  coping skills with positive outlook . Staff Offered emotional support and reassurance. Monitor and evaluate progress toward psychosocial goal(s).  Goal(s): Help patient work toward returning to meaningful activities that improve patient's QOL and are attainable with patient's lung disease Gain more stamina Breathe better Feel good   05/31/2015 12:38 PM

## 2015-05-31 NOTE — Addendum Note (Signed)
Encounter addended by: Cathie Olden, RN on: 05/31/2015 12:43 PM<BR>     Documentation filed: Notes Section

## 2015-06-04 ENCOUNTER — Encounter (HOSPITAL_COMMUNITY)
Admission: RE | Admit: 2015-06-04 | Discharge: 2015-06-04 | Disposition: A | Discharge status: Home / Self Care | Payer: Medicare Other | Source: Ambulatory Visit | Attending: Internal Medicine | Admitting: Internal Medicine

## 2015-06-04 DIAGNOSIS — J449 Chronic obstructive pulmonary disease, unspecified: Secondary | ICD-10-CM | POA: Diagnosis not present

## 2015-06-05 NOTE — Addendum Note (Signed)
Encounter addended by: Cathie Olden, RN on: 06/05/2015  2:14 PM<BR>     Documentation filed: Notes Section

## 2015-06-06 ENCOUNTER — Encounter (HOSPITAL_COMMUNITY)
Admission: RE | Admit: 2015-06-06 | Discharge: 2015-06-06 | Disposition: A | Discharge status: Home / Self Care | Payer: Medicare Other | Source: Ambulatory Visit | Attending: Internal Medicine | Admitting: Internal Medicine

## 2015-06-06 DIAGNOSIS — J449 Chronic obstructive pulmonary disease, unspecified: Secondary | ICD-10-CM | POA: Diagnosis not present

## 2015-06-11 ENCOUNTER — Encounter (HOSPITAL_COMMUNITY)
Admission: RE | Admit: 2015-06-11 | Discharge: 2015-06-11 | Disposition: A | Discharge status: Home / Self Care | Payer: Medicare Other | Source: Ambulatory Visit | Attending: Internal Medicine | Admitting: Internal Medicine

## 2015-06-11 ENCOUNTER — Ambulatory Visit: Payer: Medicare Other | Admitting: Pulmonary Disease

## 2015-06-11 DIAGNOSIS — J449 Chronic obstructive pulmonary disease, unspecified: Secondary | ICD-10-CM | POA: Diagnosis not present

## 2015-06-13 ENCOUNTER — Encounter (HOSPITAL_COMMUNITY)
Admission: RE | Admit: 2015-06-13 | Discharge: 2015-06-13 | Disposition: A | Discharge status: Home / Self Care | Payer: Medicare Other | Source: Ambulatory Visit | Attending: Internal Medicine | Admitting: Internal Medicine

## 2015-06-13 DIAGNOSIS — J449 Chronic obstructive pulmonary disease, unspecified: Secondary | ICD-10-CM | POA: Insufficient documentation

## 2015-06-18 ENCOUNTER — Encounter (HOSPITAL_COMMUNITY): Payer: Medicare Other

## 2015-06-18 NOTE — Progress Notes (Signed)
[name] 81 y.o. male  60 day Psychosocial Note  Patient psychosocial assessment reveals no barriers to participation in Pulmonary Rehab.  Patient does continue to exhibit positive coping skills. Offered emotional support and reassurance. Patient does feel he is making progress toward Pulmonary Rehab goals. Patient reports his health and activity level has improved in the past 30 days as evidenced by patient's report of increased strength. Patient reports feeling positive about current and projected progression in Pulmonary Rehab. After reviewing the patient's treatment plan, the patient is making progress toward Pulmonary Rehab goals. Patient's rate of progress toward rehab goals is fair. Plan of action to help patient continue to work towards rehab goals include education, support and encourgement. Will continue to monitor and evaluate progress toward psychosocial goal(s).  Goal(s) in progress: Help patient work toward returning to meaningful activities that improve patient's QOL and are attainable with patient's lung disease Increase stamina Breathe better

## 2015-06-20 ENCOUNTER — Encounter (HOSPITAL_COMMUNITY)
Admission: RE | Admit: 2015-06-20 | Discharge: 2015-06-20 | Disposition: A | Discharge status: Home / Self Care | Payer: Medicare Other | Source: Ambulatory Visit | Attending: Internal Medicine | Admitting: Internal Medicine

## 2015-06-20 DIAGNOSIS — J449 Chronic obstructive pulmonary disease, unspecified: Secondary | ICD-10-CM | POA: Diagnosis not present

## 2015-06-25 ENCOUNTER — Encounter (HOSPITAL_COMMUNITY)
Admission: RE | Admit: 2015-06-25 | Discharge: 2015-06-25 | Disposition: A | Discharge status: Home / Self Care | Payer: Medicare Other | Source: Ambulatory Visit | Attending: Internal Medicine | Admitting: Internal Medicine

## 2015-06-25 DIAGNOSIS — J449 Chronic obstructive pulmonary disease, unspecified: Secondary | ICD-10-CM | POA: Diagnosis not present

## 2015-06-27 ENCOUNTER — Encounter (HOSPITAL_COMMUNITY)
Admission: RE | Admit: 2015-06-27 | Discharge: 2015-06-27 | Disposition: A | Discharge status: Home / Self Care | Payer: Medicare Other | Source: Ambulatory Visit | Attending: Internal Medicine | Admitting: Internal Medicine

## 2015-06-27 DIAGNOSIS — J449 Chronic obstructive pulmonary disease, unspecified: Secondary | ICD-10-CM | POA: Diagnosis not present

## 2015-07-02 ENCOUNTER — Encounter (INDEPENDENT_AMBULATORY_CARE_PROVIDER_SITE_OTHER): Payer: Self-pay | Admitting: Internal Medicine

## 2015-07-02 ENCOUNTER — Encounter (HOSPITAL_COMMUNITY)
Admission: RE | Admit: 2015-07-02 | Discharge: 2015-07-02 | Disposition: A | Discharge status: Home / Self Care | Payer: Medicare Other | Source: Ambulatory Visit | Attending: Internal Medicine | Admitting: Internal Medicine

## 2015-07-02 ENCOUNTER — Telehealth: Payer: Self-pay | Admitting: Internal Medicine

## 2015-07-02 ENCOUNTER — Ambulatory Visit (INDEPENDENT_AMBULATORY_CARE_PROVIDER_SITE_OTHER): Payer: Medicare Other | Admitting: Internal Medicine

## 2015-07-02 VITALS — BP 120/72 | HR 70 | Temp 98.3°F | Resp 18 | Ht 73.0 in | Wt 160.4 lb

## 2015-07-02 DIAGNOSIS — Z1159 Encounter for screening for other viral diseases: Secondary | ICD-10-CM

## 2015-07-02 DIAGNOSIS — J449 Chronic obstructive pulmonary disease, unspecified: Secondary | ICD-10-CM | POA: Diagnosis not present

## 2015-07-02 DIAGNOSIS — K5190 Ulcerative colitis, unspecified, without complications: Secondary | ICD-10-CM

## 2015-07-02 NOTE — Telephone Encounter (Signed)
Per 05/16/15 O: Patient Instructions       Script for Advair 250 to replace Dulera      Inhale 1 puff, then rinse mouth, twice daily  Ok to keep 4/ 25 appointment with Dr Annamaria Boots and cancel the 1/31 appointment with Dr Elsworth Soho unless you want to handle it differently  --  Called spoke with pt spouse. She reports the Advair is causing pt to be very hoarse. She reports pt is rinsing after each use. She feels he needs to be changed to something different. Please advise Dr. Annamaria Boots thanks  Allergies  Allergen Reactions  . Nsaids     Colitis, upset stomach     Current Outpatient Prescriptions on File Prior to Visit  Medication Sig Dispense Refill  . acetaminophen (TYLENOL) 500 MG tablet Take 500 mg by mouth every 6 (six) hours as needed for mild pain (takes as needed for arthritis pain).    . camphor-menthol (SARNA) lotion Apply 1 application topically as needed for itching.    . Cholecalciferol (VITAMIN D) 2000 UNITS CAPS Take 1 capsule by mouth daily with supper.     . DELZICOL 400 MG CPDR DR capsule TAKE TWO CAPSULES BY MOUTH TWICE DAILY 120 capsule 5  . docusate sodium (COLACE) 100 MG capsule Take 100 mg by mouth daily as needed for mild constipation.    . fluticasone-salmeterol (ADVAIR HFA) 115-21 MCG/ACT inhaler Inhale 2 puffs into the lungs 2 (two) times daily. 1 Inhaler 12  . isosorbide mononitrate (IMDUR) 30 MG 24 hr tablet Take 1 tablet (30 mg total) by mouth 2 (two) times daily. 180 tablet 3  . levothyroxine (SYNTHROID, LEVOTHROID) 50 MCG tablet Take 50 mcg by mouth daily before breakfast.     . mesalamine (ASACOL) 400 MG EC tablet Take 800 mg by mouth 2 (two) times daily.    . metoprolol (LOPRESSOR) 50 MG tablet TAKE ONE TABLET BY MOUTH TWICE DAILY 60 tablet 6  . nitroGLYCERIN (NITROSTAT) 0.4 MG SL tablet Place 1 tablet (0.4 mg total) under the tongue every 5 (five) minutes as needed for chest pain. 25 tablet 3  . predniSONE (DELTASONE) 5 MG tablet TAKE 1 AND 1/2 TABLETS BY MOUTH IN THE  MORNING WITH BREAKFAST 135 tablet 0  . rivaroxaban (XARELTO) 20 MG TABS tablet Take 1 tablet (20 mg total) by mouth daily with supper. 77 tablet 0  . traZODone (DESYREL) 50 MG tablet Take 1 tablet by mouth at bedtime.     No current facility-administered medications on file prior to visit.

## 2015-07-02 NOTE — Telephone Encounter (Signed)
Offer teach and trial sample Anoro Ellipta   Inhale 1 puff, once daily.   Try that instead of Advair. We want to see how he does with it, then see if insurance will cover it.

## 2015-07-02 NOTE — Patient Instructions (Signed)
Decrease prednisone to 5 mg every other day stop after 4 weeks.

## 2015-07-02 NOTE — Telephone Encounter (Signed)
ATC home #--NA and no VM. WCB

## 2015-07-02 NOTE — Progress Notes (Signed)
Presenting complaint;   Follow-up for ulcrative colitis.  Subjective:  Patient is 81 year old Caucasian male who is here for scheduled visit. He has ulcerative colitis and was last seen on 12/18/2014. He denies diarrhea or rectal bleeding. He has experienced constipation intermittently. On most days he has one to 2 formed stools daily. He is taking Colace on as-needed basis. He denies abdominal pain. He says his appetite is good most of the time. He has lost 4 pounds since his last visit. He is not concerned about his weight. 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 He is still having itching intermittently. He is pressing on prednisone 5 mg daily. Oral mesalamine was switched to Apriso as required by his [facility]. He is taking 1 capsule 4 times a day.    Current Medications: Outpatient Encounter Prescriptions as of 07/02/2015  Medication Sig  . acetaminophen (TYLENOL) 500 MG tablet Take 500 mg by mouth every 6 (six) hours as needed for mild pain (takes as needed for arthritis pain).  . APRISO 0.375 g 24 hr capsule TAKE ONE CAPSULE BY MOUTH FOUR TIMES DAILY.  . camphor-menthol (SARNA) lotion Apply 1 application topically as needed for itching.  . Cholecalciferol (VITAMIN D) 2000 UNITS CAPS Take 1 capsule by mouth daily with supper. Patient takes 500 units.  Marland Kitchen docusate sodium (COLACE) 100 MG capsule Take 100 mg by mouth daily as needed for mild constipation.  . fluticasone-salmeterol (ADVAIR HFA) 115-21 MCG/ACT inhaler Inhale 2 puffs into the lungs 2 (two) times daily.  . isosorbide mononitrate (IMDUR) 30 MG 24 hr tablet Take 1 tablet (30 mg total) by mouth 2 (two) times daily.  Marland Kitchen levothyroxine (SYNTHROID, LEVOTHROID) 50 MCG tablet Take 50 mcg by mouth daily before breakfast.   . loratadine (CLARITIN) 10 MG tablet Take 10 mg by mouth at bedtime.  . metoprolol (LOPRESSOR) 50 MG tablet TAKE ONE TABLET BY MOUTH TWICE DAILY  . nitroGLYCERIN (NITROSTAT) 0.4 MG SL  tablet Place 1 tablet (0.4 mg total) under the tongue every 5 (five) minutes as needed for chest pain.  . predniSONE (DELTASONE) 5 MG tablet TAKE 1 AND 1/2 TABLETS BY MOUTH IN THE MORNING WITH BREAKFAST (Patient taking differently: Patient is taking 5 mg daily.)  . rivaroxaban (XARELTO) 20 MG TABS tablet Take 1 tablet (20 mg total) by mouth daily with supper.  . traZODone (DESYREL) 50 MG tablet Take 1 tablet by mouth at bedtime.  . [DISCONTINUED] DELZICOL 400 MG CPDR DR capsule TAKE TWO CAPSULES BY MOUTH TWICE DAILY (Patient not taking: Reported on 07/02/2015)  . [DISCONTINUED] mesalamine (ASACOL) 400 MG EC tablet Take 800 mg by mouth 2 (two) times daily. Reported on 07/02/2015   No facility-administered encounter medications on file as of 07/02/2015.     Objective: Blood pressure 120/72, pulse 70, temperature 98.3 F (36.8 C), temperature source Oral, resp. rate 18, height 6' 1"  (1.854 m), weight 160 lb 6.4 oz (72.757 kg). Patient is alert and in no acute distress. Conjunctiva is pink. Sclera is nonicteric Oropharyngeal mucosa is normal. No neck masses or thyromegaly noted. Cardiac exam with irregular rhythm normal S1 and S2. No murmur or gallop noted. Lungs are clear to auscultation. Abdomen is flat, soft and nontender. No organomegaly or masses. Small umbilical hernia noted. No LE edema or clubbing noted.    Assessment:  #1. Ulcerative colitis remains in remission. He can take oral mesalamine as single dose rather than multiple doses. #2.  Constipation. He can use stool Soffer on daily  basis rather than when necessary. #3. 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 He is on low-dose prednisone. He has been on methotrexate in the past. Will try to wean him off prednisone.   Plan:  Decrease prednisone to 5 mg every other day for 4 weeks and then stop. Take Apriso 1500 mg po qd. Can take Colace 100 or 200 mg by mouth daily at bedtime. Office visit in one  year.

## 2015-07-03 ENCOUNTER — Other Ambulatory Visit: Payer: Self-pay | Admitting: Cardiology

## 2015-07-03 MED ORDER — RIVAROXABAN 20 MG PO TABS: 20.0000 mg | ORAL_TABLET | Freq: Every day | ORAL | Status: DC

## 2015-07-03 MED ORDER — UMECLIDINIUM-VILANTEROL 62.5-25 MCG/INH IN AEPB: 1.0000 | INHALATION_SPRAY | Freq: Every day | RESPIRATORY_TRACT | Status: DC

## 2015-07-03 NOTE — Telephone Encounter (Signed)
Spoke with pt's wife. She is aware of CY's recommendation. They would like for a prescription to be sent in. They live in [place] and can't come and get the sample. Rx has been sent in. Nothing further was needed.

## 2015-07-03 NOTE — Telephone Encounter (Signed)
Needs refill on XARELTO) 20 MG  Please call [facility].  Patient is having all medications switched to [facility].

## 2015-07-03 NOTE — Progress Notes (Signed)
Pulmonary Rehabilitation Program Outcomes Report   Orientation:  03/28/15 but started 04/22/16 due to Pacemaker insertion Graduate Date:  tbd Discharge Date:  tbd # of sessions completed: 18  Pulmonologist: yound Family MD:  [name] Class Time:  1045  A.  Exercise Program:  Tolerates exercise @ 3.48 METS for 15 minutes  B.  Mental Health:  Good mental attitude  C.  Education/Instruction/Skills  Uses Perceived Exertion Scale and/or Dyspnea Scale  Demonstrates accurate pursed lip breathing  D.  Nutrition/Weight Control/Body Composition:  Adherence to prescribed nutrition program: good    E.  Blood Lipids   No results found for: CHOL, HDL, LDLCALC, LDLDIRECT, TRIG, CHOLHDL  F.  Lifestyle Changes:  Making positive lifestyle changes and Not smoking:  Quit 1984  G.  Symptoms noted with exercise:  Asymptomatic  Report Completed By:  Stevphen Rochester RN   Comments:  This is the patients halfway progress note for [facility].  Patient is progressing well.

## 2015-07-04 ENCOUNTER — Encounter (HOSPITAL_COMMUNITY)
Admission: RE | Admit: 2015-07-04 | Discharge: 2015-07-04 | Disposition: A | Discharge status: Home / Self Care | Payer: Medicare Other | Source: Ambulatory Visit | Attending: Internal Medicine | Admitting: Internal Medicine

## 2015-07-04 DIAGNOSIS — J449 Chronic obstructive pulmonary disease, unspecified: Secondary | ICD-10-CM | POA: Diagnosis not present

## 2015-07-09 ENCOUNTER — Encounter: Payer: Self-pay | Admitting: Internal Medicine

## 2015-07-09 ENCOUNTER — Encounter (HOSPITAL_COMMUNITY)
Admission: RE | Admit: 2015-07-09 | Discharge: 2015-07-09 | Disposition: A | Discharge status: Home / Self Care | Payer: Medicare Other | Source: Ambulatory Visit | Attending: Internal Medicine | Admitting: Internal Medicine

## 2015-07-09 ENCOUNTER — Ambulatory Visit (INDEPENDENT_AMBULATORY_CARE_PROVIDER_SITE_OTHER): Payer: Medicare Other | Admitting: Internal Medicine

## 2015-07-09 VITALS — BP 130/86 | HR 64 | Ht 73.0 in | Wt 163.0 lb

## 2015-07-09 DIAGNOSIS — Z950 Presence of cardiac pacemaker: Secondary | ICD-10-CM

## 2015-07-09 DIAGNOSIS — I4819 Other persistent atrial fibrillation: Secondary | ICD-10-CM

## 2015-07-09 DIAGNOSIS — J449 Chronic obstructive pulmonary disease, unspecified: Secondary | ICD-10-CM | POA: Diagnosis not present

## 2015-07-09 DIAGNOSIS — I481 Persistent atrial fibrillation: Secondary | ICD-10-CM | POA: Diagnosis not present

## 2015-07-09 DIAGNOSIS — I495 Sick sinus syndrome: Secondary | ICD-10-CM

## 2015-07-09 LAB — CUP PACEART INCLINIC DEVICE CHECK
Brady Statistic RA Percent Paced: 0 %
Brady Statistic RV Percent Paced: 23 %
Implantable Lead Implant Date: 20070924
Implantable Lead Location: 753859
Implantable Lead Model: 5076
Lead Channel Impedance Value: 400 Ohm
Lead Channel Pacing Threshold Amplitude: 1 V
Lead Channel Pacing Threshold Pulse Width: 0.4 ms
Lead Channel Sensing Intrinsic Amplitude: 1.1 mV
Lead Channel Sensing Intrinsic Amplitude: 10.2 mV
MDC IDC LEAD IMPLANT DT: 20070924
MDC IDC LEAD LOCATION: 753860
MDC IDC MSMT BATTERY REMAINING LONGEVITY: 128.4
MDC IDC MSMT BATTERY VOLTAGE: 3.01 V
MDC IDC MSMT LEADCHNL RA IMPEDANCE VALUE: 475 Ohm
MDC IDC MSMT LEADCHNL RV PACING THRESHOLD AMPLITUDE: 1 V
MDC IDC MSMT LEADCHNL RV PACING THRESHOLD PULSEWIDTH: 0.4 ms
MDC IDC SESS DTM: 20170228140905
MDC IDC SET LEADCHNL RA PACING AMPLITUDE: 2 V
MDC IDC SET LEADCHNL RV PACING AMPLITUDE: 2.5 V
MDC IDC SET LEADCHNL RV PACING PULSEWIDTH: 0.4 ms
MDC IDC SET LEADCHNL RV SENSING SENSITIVITY: 2 mV
Pulse Gen Model: 2240
Pulse Gen Serial Number: 7826824

## 2015-07-09 NOTE — Patient Instructions (Signed)
Your physician recommends that you continue on your current medications as directed. Please refer to the Current Medication list given to you today. Device check on 10/08/15. Your physician recommends that you schedule a follow-up appointment in: 1 year with Dr. Rayann Heman. Please schedule this appointment today.

## 2015-07-09 NOTE — Progress Notes (Signed)
PCP: Tivis Ringer, MD Primary Cardiologist:  Dr Bronson Ing  CC: fatigue  [name] is a 81 y.o. male who presents today for routine electrophysiology followup.  Since last being seen in our clinic, the patient reports doing very well.    He is tolerating xarelto without bleeding.  His primary concern is fatigue.  He has been in afib since his generator change but is mostly unaware.  Continues to work in the yard.  Occasional atypical L chest pain.  Participating in pulmonary rehab.  Today, he denies symptoms of palpitations, chest pain,   lower extremity edema, dizziness, presyncope, or syncope.  The patient is otherwise without complaint today.   Past Medical History  Diagnosis Date  . Hypertension   . Dyslipidemia   . Atrial fibrillation ([name])   . CAD (coronary artery disease)     a.  cath 5/12: mLAD 50% then tandem 50%, dLAD 30%; D1 30%; OM1 occluded with collateral flow; pRCA 25%; EF 55% with inf HK;  occluded OM likely cause of perfusion defect on Myoview  . Tachy-brady syndrome ([name])     s/p pacer  . Ulcerative colitis   . Dizziness   . Nonspecific abnormal unspecified cardiovascular function study   . GERD (gastroesophageal reflux disease)   . Arthritis   . Arrhythmia   . Pelvic fracture ([name])   . Hyperlipidemia   . Depression   . Anxiety   . Gout   . Dementia     short term  . Skin disorder     blister formation  . Hearing disorder   . Hypothyroidism   . COPD (chronic obstructive pulmonary disease) ([place])   . Colitis   . Pacemaker    Past Surgical History  Procedure Laterality Date  . Implantation of a medtronic dual chamber pacemaker  02/01/06    [name] [name] MD  . Left knee arthroscopy with debridement  04/23/2006    [name]. Norris M.D  . Left knee total knee  replacement  05/13/07    [name]. Norris M.D.  . Knee surgery  2005    replacement  . Back surgery  1975  . Lumbar spine surgery    . Insert / replace / remove pacemaker       MEDTRONIC....PPM Serial Number:  [id number].....William KitchenPPM Model Number:  [id number]....William KitchenPPM DOI:  02/01/2006    . Cardiac catheterization  09/20/10    EF 55%.....Dr. Percival Spanish  . Left heart catheterization with coronary angiogram N/A 07/06/2014    Procedure: LEFT HEART CATHETERIZATION WITH CORONARY ANGIOGRAM;  Surgeon: Sinclair Grooms, MD;  Location: [facility] CATH LAB;  Service: Cardiovascular;  Laterality: N/A;  . Cataract extraction Right   . Cataract extraction w/phaco Left 12/17/2014    Procedure: CATARACT EXTRACTION PHACO AND INTRAOCULAR LENS PLACEMENT (IOC);  Surgeon: Tonny Branch, MD;  Location: [facility] ORS;  Service: Ophthalmology;  Laterality: Left;  CDE 7.44  . Ep implantable device N/A 04/02/2015    Procedure:  PPM Generator Changeout;  Surgeon: Thompson Grayer, MD;  Location: [place] CV LAB;  Service: Cardiovascular;  Laterality: N/A;    Current Outpatient Prescriptions  Medication Sig Dispense Refill  . acetaminophen (TYLENOL) 500 MG tablet Take 500 mg by mouth every 6 (six) hours as needed for mild pain (takes as needed for arthritis pain).    . APRISO 0.375 g 24 hr capsule Take 1,500 mg by mouth every morning.  5  . camphor-menthol (SARNA) lotion Apply 1 application topically as needed for itching.    [name]  Cholecalciferol (VITAMIN D) 2000 UNITS CAPS Take 1 capsule by mouth daily with supper. Patient takes 5000 units.    [name] docusate sodium (COLACE) 100 MG capsule Take 100 mg by mouth daily as needed for mild constipation.    . fluticasone-salmeterol (ADVAIR HFA) 115-21 MCG/ACT inhaler Inhale 2 puffs into the lungs 2 (two) times daily. 1 Inhaler 12  . isosorbide mononitrate (IMDUR) 30 MG 24 hr tablet Take 1 tablet (30 mg total) by mouth 2 (two) times daily. 180 tablet 3  . levothyroxine (SYNTHROID, LEVOTHROID) 50 MCG tablet Take 50 mcg by mouth daily before breakfast.     . loratadine (CLARITIN) 10 MG tablet Take 10 mg by mouth at bedtime.    . metoprolol (LOPRESSOR) 50 MG tablet TAKE ONE TABLET BY MOUTH  TWICE DAILY 60 tablet 6  . nitroGLYCERIN (NITROSTAT) 0.4 MG SL tablet Place 1 tablet (0.4 mg total) under the tongue every 5 (five) minutes as needed for chest pain. 25 tablet 3  . predniSONE (DELTASONE) 5 MG tablet TAKE 1 AND 1/2 TABLETS BY MOUTH IN THE MORNING WITH BREAKFAST (Patient taking differently: Patient is taking 5 mg daily.) 135 tablet 0  . rivaroxaban (XARELTO) 20 MG TABS tablet Take 1 tablet (20 mg total) by mouth daily with supper. 30 tablet 6  . traZODone (DESYREL) 50 MG tablet Take 1 tablet by mouth at bedtime.    [name] umeclidinium-vilanterol (ANORO ELLIPTA) 62.5-25 MCG/INH AEPB Inhale 1 puff into the lungs daily. 60 each 5   No current facility-administered medications for this visit.    Physical Exam: Filed Vitals:   07/09/15 0900  BP: 130/86  Pulse: 64  Height: 6' 1"  (1.854 m)  Weight: 163 lb (73.936 kg)    GEN- The patient is well appearing, alert and oriented x 3 today.   Head- normocephalic, atraumatic Eyes-  Sclera clear, conjunctiva pink Ears- hearing intact Oropharynx- clear Lungs- Clear to ausculation bilaterally, normal work of breathing Chest- pacemaker pocket is well healed Heart- Regular rate and rhythm, no murmurs, rubs or gallops, PMI not laterally displaced GI- soft, NT, ND, + BS Extremities- no clubbing, cyanosis, or edema  Pacemaker interrogation- reviewed in detail today,  See PACEART report  Assessment and Plan:  1. Tachycardia/ Bradycardia Normal pacemaker function See Pace Art report No changes today  2. Persistent afib amiodarone stopped by Dr Bronson Ing (per patient) He appears to be tolerating afib and V rates are mostly controlled Therapeutic strategies for afib including rate and rhythm control were discussed in detail with the patient today.  We discussed tikosyn and sotalol which would require hospitalization.  He would prefer rate control going forward. Will reprogram device VVIR upon return if he remains in afib at that  time. Continue xarelto long term  3. CAD No ischemic symptoms  Merlin Return to see me in 1 year Follow-up with Dr Bronson Ing as scheduled   [name]

## 2015-07-11 ENCOUNTER — Encounter (HOSPITAL_COMMUNITY)
Admission: RE | Admit: 2015-07-11 | Discharge: 2015-07-11 | Disposition: A | Discharge status: Home / Self Care | Payer: Medicare Other | Source: Ambulatory Visit | Attending: Internal Medicine | Admitting: Internal Medicine

## 2015-07-11 DIAGNOSIS — J449 Chronic obstructive pulmonary disease, unspecified: Secondary | ICD-10-CM | POA: Diagnosis present

## 2015-07-11 DIAGNOSIS — J438 Other emphysema: Secondary | ICD-10-CM | POA: Diagnosis not present

## 2015-07-12 ENCOUNTER — Encounter: Payer: Medicare Other | Admitting: Internal Medicine

## 2015-07-15 NOTE — Progress Notes (Signed)
[name] 81 y.o. male 21 day Psychosocial Note  Patient psychosocial assessment reveals no barriers to participation in Pulmonary Rehab. Psychosocial areas that are currently affecting patient's rehab experience include none. Patient does exhibit positive coping skills Offered emotional support and reassurance. Patient does feel he is making progress toward Pulmonary Rehab goals. Patient reports his health and activity level has improved in the past 30 days as evidenced by patient's report of increased ability to breathe better with doing certain things. Patient states family/friends have not noticed changes in his activity or mood. Patient reports feeling positive about current and projected progression in Pulmonary Rehab. After reviewing the patient's treatment plan, the patient is making progress toward Pulmonary Rehab goals. Patient's rate of progress toward rehab goals is good. Plan of action to help patient continue to work towards rehab goals include education, support and encouragement. Will continue to monitor and evaluate progress toward psychosocial goal(s).  Goal(s) in progress: Help patient work toward returning to meaningful activities that improve patient's QOL and are attainable with patient's lung disease Increase stamina  Breathe better

## 2015-07-16 ENCOUNTER — Encounter: Payer: Self-pay | Admitting: Internal Medicine

## 2015-07-16 ENCOUNTER — Ambulatory Visit (INDEPENDENT_AMBULATORY_CARE_PROVIDER_SITE_OTHER): Payer: Medicare Other | Admitting: Internal Medicine

## 2015-07-16 ENCOUNTER — Encounter (HOSPITAL_COMMUNITY): Payer: Medicare Other

## 2015-07-16 DIAGNOSIS — I482 Chronic atrial fibrillation, unspecified: Secondary | ICD-10-CM

## 2015-07-16 MED ORDER — METOPROLOL TARTRATE 50 MG PO TABS: ORAL_TABLET | ORAL | Status: DC

## 2015-07-16 NOTE — Patient Instructions (Signed)
Your physician recommends that you schedule a follow-up appointment in: [place] DR. ALLRED.  Your physician has recommended you make the following change in your medication:   TAKE 1 AND 1/2 TABLET (75 MG) IN THE AM, AND TAKE 1 TABLET (50 MG) IN THE EVENING  If you need a refill on your cardiac medications before your next appointment, please call your pharmacy.  Thank you for choosing [facility]!

## 2015-07-16 NOTE — Progress Notes (Signed)
HPI [name] is here today after being referred from cardiac rehab for palpitations and anxiety to have his PM checked. He is a pleasant 81 yo man with a h/o HTN, atrial fib and symptomatic sinus node dysfunction s/p PPM insertion. He had not seen me for 3-4 years, now following with Dr. Rayann Heman as he lives in [place]. The patient was in cardiac rehab and mentioned that he felt like he was out of rhythm and was sent to our office from rehab to have his PM checked. He has been in atrial fib since December. He saw Dr. Rayann Heman last week without any complaints of palpitations. He denies chest pain or sob. He has some anxiety. He denies PND or orthopnea.  Allergies  Allergen Reactions  . Nsaids     Colitis, upset stomach     Current Outpatient Prescriptions  Medication Sig Dispense Refill  . acetaminophen (TYLENOL) 500 MG tablet Take 500 mg by mouth every 6 (six) hours as needed for mild pain (takes as needed for arthritis pain).    . APRISO 0.375 g 24 hr capsule Take 1,500 mg by mouth every morning.  5  . camphor-menthol (SARNA) lotion Apply 1 application topically as needed for itching.    . Cholecalciferol (VITAMIN D) 2000 UNITS CAPS Take 1 capsule by mouth daily with supper. Patient takes 5000 units.    Marland Kitchen docusate sodium (COLACE) 100 MG capsule Take 100 mg by mouth daily as needed for mild constipation.    . fluticasone-salmeterol (ADVAIR HFA) 115-21 MCG/ACT inhaler Inhale 2 puffs into the lungs 2 (two) times daily. 1 Inhaler 12  . isosorbide mononitrate (IMDUR) 30 MG 24 hr tablet Take 1 tablet (30 mg total) by mouth 2 (two) times daily. 180 tablet 3  . levothyroxine (SYNTHROID, LEVOTHROID) 50 MCG tablet Take 50 mcg by mouth daily before breakfast.     . loratadine (CLARITIN) 10 MG tablet Take 10 mg by mouth at bedtime.    . nitroGLYCERIN (NITROSTAT) 0.4 MG SL tablet Place 1 tablet (0.4 mg total) under the tongue every 5 (five) minutes as needed for chest pain. 25 tablet 3  . predniSONE  (DELTASONE) 5 MG tablet TAKE 1 AND 1/2 TABLETS BY MOUTH IN THE MORNING WITH BREAKFAST (Patient taking differently: Patient is taking 5 mg daily.) 135 tablet 0  . rivaroxaban (XARELTO) 20 MG TABS tablet Take 1 tablet (20 mg total) by mouth daily with supper. 30 tablet 6  . traZODone (DESYREL) 50 MG tablet Take 1 tablet by mouth at bedtime.    Marland Kitchen umeclidinium-vilanterol (ANORO ELLIPTA) 62.5-25 MCG/INH AEPB Inhale 1 puff into the lungs daily. 60 each 5  . [DISCONTINUED] metoprolol (LOPRESSOR) 50 MG tablet TAKE ONE TABLET BY MOUTH TWICE DAILY 60 tablet 6   No current facility-administered medications for this visit.     Past Medical History  Diagnosis Date  . Hypertension   . Dyslipidemia   . Persistent atrial fibrillation ([place])   . CAD (coronary artery disease)     a.  cath 5/12: mLAD 50% then tandem 50%, dLAD 30%; D1 30%; OM1 occluded with collateral flow; pRCA 25%; EF 55% with inf HK;  occluded OM likely cause of perfusion defect on Myoview  . Tachy-brady syndrome ([place])     s/p pacer  . Ulcerative colitis   . Dizziness   . Nonspecific abnormal unspecified cardiovascular function study   . GERD (gastroesophageal reflux disease)   . Arthritis   . Pelvic fracture ([place])   .  Hyperlipidemia   . Depression   . Anxiety   . Gout   . Dementia     short term  . Skin disorder     blister formation  . Hearing disorder   . Hypothyroidism   . COPD (chronic obstructive pulmonary disease) ([place])   . Colitis   . Pacemaker     ROS:   All systems reviewed and negative except as noted in the HPI.   Past Surgical History  Procedure Laterality Date  . Implantation of a medtronic dual chamber pacemaker  02/01/06    [name] [name] MD  . Left knee arthroscopy with debridement  04/23/2006    [name] M.D  . Left knee total knee  replacement  05/13/07    [name] M.D.  . Knee surgery  2005    replacement  . Back surgery  1975  . Lumbar spine surgery    . Insert / replace /  remove pacemaker      MEDTRONIC....PPM Serial Number:  [id number].....Marland KitchenPPM Model Number:  [id number]....Marland KitchenPPM DOI:  02/01/2006    . Cardiac catheterization  09/20/10    EF 55%.....Dr. Percival Spanish  . Left heart catheterization with coronary angiogram N/A 07/06/2014    Procedure: LEFT HEART CATHETERIZATION WITH CORONARY ANGIOGRAM;  Surgeon: Sinclair Grooms, MD;  Location: [facility] CATH LAB;  Service: Cardiovascular;  Laterality: N/A;  . Cataract extraction Right   . Cataract extraction w/phaco Left 12/17/2014    Procedure: CATARACT EXTRACTION PHACO AND INTRAOCULAR LENS PLACEMENT (IOC);  Surgeon: Tonny Branch, MD;  Location: [facility] ORS;  Service: Ophthalmology;  Laterality: Left;  CDE 7.44  . Ep implantable device N/A 04/02/2015    Procedure:  PPM Generator Changeout;  Surgeon: Thompson Grayer, MD;  Location: [facility] CV LAB;  Service: Cardiovascular;  Laterality: N/A;     Family History  Problem Relation Age of Onset  . Lung disease Father     Emphysema  . Hypertension Mother   . Liver cancer Mother   . Pernicious anemia Mother   . Asthma Sister   . Healthy Daughter   . Healthy Daughter   . Healthy Son   . Hypertension Father   . Peptic Ulcer Disease Father   . COPD Father   . Depression Mother   . Kidney disease Mother   . Colitis Mother   . COPD Sister   . Glaucoma Sister   . Hypertension Sister   . Cancer Sister      Social History   Social History  . Marital Status: Married    Spouse Name: [name]  . Number of Children: 3  . Years of Education: 12   Occupational History  . retired    Social History Main Topics  . Smoking status: Former Smoker -- 1.00 packs/day for 15 years    Types: Cigarettes    Start date: 05/13/1955    Quit date: 05/12/1983  . Smokeless tobacco: Never Used  . Alcohol Use: No  . Drug Use: No  . Sexual Activity: Yes    Birth Control/ Protection: None   Other Topics Concern  . Not on file   Social History Narrative   One cup of caffeine daily     BP  - 122/82 by me, Pulse -82 and IriR Physical Exam:  Well appearing NAD HEENT: Unremarkable Neck:  No JVD, no thyromegally Lymphatics:  No adenopathy Back:  No CVA tenderness Lungs:  Clear HEART:  IRIR rhythm, no murmurs, no rubs, no clicks  Abd:  soft, positive bowel sounds, no organomegally, no rebound, no guarding Ext:  2 plus pulses, no edema, no cyanosis, no clubbing Skin:  No rashes no nodules Neuro:  CN II through XII intact, motor grossly intact  DEVICE  Normal device function.  See PaceArt for details. His daytime ventricular rates are inching up a bit.  Assess/Plan: 1. Atrial fib - he appears to be a bit more symptomatic. I have asked him to increase his metoprolol to 75 mg in a.m. And 50 mg in p.m. He will need to follow up with Dr. Greggory Brandy to re-discuss rate vs rhythm control. 2. HTN - his blood pressure today is controlled. Will follow. 3. Palpitations - he remains in atrial fib. No evidence of increase in ventricular ectopy.  4. PPM - his [employer] DDD PM is working normally. Will recheck in several months.  Mikle Bosworth.D.

## 2015-07-17 ENCOUNTER — Telehealth (INDEPENDENT_AMBULATORY_CARE_PROVIDER_SITE_OTHER): Payer: Self-pay | Admitting: Internal Medicine

## 2015-07-17 DIAGNOSIS — K51911 Ulcerative colitis, unspecified with rectal bleeding: Secondary | ICD-10-CM

## 2015-07-17 NOTE — Telephone Encounter (Signed)
Will call (07/18/2015) patient to see if she wants to come and pick up stool cards or have them mailed.

## 2015-07-17 NOTE — Telephone Encounter (Signed)
[name] left a message saying [name] is having problems with his Colitis and he's bleeding some. She'd like his stool to be checked due to this and the fact that he's afib and on a blood thinner. Please give her a phone call regarding this.   [name]'s ph# [phone]  Thank you.

## 2015-07-18 ENCOUNTER — Encounter (HOSPITAL_COMMUNITY): Payer: Medicare Other

## 2015-07-18 NOTE — Telephone Encounter (Addendum)
Per Dr.[name] - Patient may go back to 10 mg of Prednisone. When he starts to feel better decrease it to 7.5 mg. Patient needs to find at what dose the itching is better or no itching. The positive hemoccult , patient is taking a blood thinner. He will need to see Dr.Beavers for the itching. Patient will need to have a CBC,CRP. Patient's wife called and made aware.

## 2015-07-18 NOTE — Telephone Encounter (Signed)
Patient's wife has ask that we fax this request to PCP as patient has an appointment there tomorrow. This has been done.

## 2015-07-18 NOTE — Telephone Encounter (Signed)
Patient has an appointment with Terri this morning. She is bringing a stool sample for her husband. I will check it for blood. She states that since his OV, he has started itching bad again in addition to the rectal bleed.  We will check stool sample,then we will check with Dr.[name].

## 2015-07-18 NOTE — Telephone Encounter (Signed)
   Diagnosis:    Result(s)   Card 1: Positive          Completed by: Thomas Hoff, LPN   HEMOCCULT SENSA DEVELOPER: LOT#:[id number]   EXPIRATION DATE: 9-17   HEMOCCULT SENSA CARD:  LOT#:  02/14 EXPIRATION DATE: 07/18   CARD CONTROL RESULTS:  POSITIVE: Positive NEGATIVE: Negative    ADDITIONAL COMMENTS: Patient's wife was made aware. Will review with Dr.[name]. Patient was seen in the office 07/02/2015 . Decrease prednisone to 5 mg every other day for 4 weeks and then stop. Take Apriso 1500 mg po qd. Can take Colace 100 or 200 mg by mouth daily at bedtime.  Office visit in one year.

## 2015-07-19 DIAGNOSIS — I480 Paroxysmal atrial fibrillation: Secondary | ICD-10-CM | POA: Diagnosis not present

## 2015-07-19 DIAGNOSIS — Z950 Presence of cardiac pacemaker: Secondary | ICD-10-CM | POA: Diagnosis not present

## 2015-07-19 DIAGNOSIS — Z6821 Body mass index (BMI) 21.0-21.9, adult: Secondary | ICD-10-CM | POA: Diagnosis not present

## 2015-07-19 DIAGNOSIS — E038 Other specified hypothyroidism: Secondary | ICD-10-CM | POA: Diagnosis not present

## 2015-07-19 DIAGNOSIS — K529 Noninfective gastroenteritis and colitis, unspecified: Secondary | ICD-10-CM | POA: Diagnosis not present

## 2015-07-19 DIAGNOSIS — F39 Unspecified mood [affective] disorder: Secondary | ICD-10-CM | POA: Diagnosis not present

## 2015-07-23 ENCOUNTER — Encounter (HOSPITAL_COMMUNITY)
Admission: RE | Admit: 2015-07-23 | Discharge: 2015-07-23 | Disposition: A | Discharge status: Home / Self Care | Payer: Medicare Other | Source: Ambulatory Visit | Attending: Internal Medicine | Admitting: Internal Medicine

## 2015-07-23 DIAGNOSIS — J438 Other emphysema: Secondary | ICD-10-CM | POA: Diagnosis not present

## 2015-07-23 DIAGNOSIS — J449 Chronic obstructive pulmonary disease, unspecified: Secondary | ICD-10-CM | POA: Diagnosis not present

## 2015-07-25 ENCOUNTER — Encounter (HOSPITAL_COMMUNITY)
Admission: RE | Admit: 2015-07-25 | Discharge: 2015-07-25 | Disposition: A | Discharge status: Home / Self Care | Payer: Medicare Other | Source: Ambulatory Visit | Attending: Internal Medicine | Admitting: Internal Medicine

## 2015-07-25 DIAGNOSIS — J449 Chronic obstructive pulmonary disease, unspecified: Secondary | ICD-10-CM | POA: Diagnosis not present

## 2015-07-25 DIAGNOSIS — J438 Other emphysema: Secondary | ICD-10-CM | POA: Diagnosis not present

## 2015-07-25 NOTE — Telephone Encounter (Signed)
Benancio Deeds called to receive the lab results for her husband. She'd like a phone call regarding this.  Pt's ph# [phone] Thank you.

## 2015-07-29 NOTE — Telephone Encounter (Signed)
Patient was called ,message was left , that we had not rec'd the results from PCP yet.

## 2015-07-30 ENCOUNTER — Encounter (HOSPITAL_COMMUNITY)
Admission: RE | Admit: 2015-07-30 | Discharge: 2015-07-30 | Disposition: A | Discharge status: Home / Self Care | Payer: Medicare Other | Source: Ambulatory Visit | Attending: Internal Medicine | Admitting: Internal Medicine

## 2015-07-30 DIAGNOSIS — J438 Other emphysema: Secondary | ICD-10-CM | POA: Diagnosis not present

## 2015-07-30 DIAGNOSIS — J449 Chronic obstructive pulmonary disease, unspecified: Secondary | ICD-10-CM | POA: Diagnosis not present

## 2015-07-31 LAB — CUP PACEART INCLINIC DEVICE CHECK
Date Time Interrogation Session: 20170322161803
Implantable Lead Implant Date: 20070924
Implantable Lead Location: 753859
MDC IDC LEAD IMPLANT DT: 20070924
MDC IDC LEAD LOCATION: 753860
Pulse Gen Serial Number: 7826824

## 2015-08-01 ENCOUNTER — Encounter (HOSPITAL_COMMUNITY)
Admission: RE | Admit: 2015-08-01 | Discharge: 2015-08-01 | Disposition: A | Discharge status: Home / Self Care | Payer: Medicare Other | Source: Ambulatory Visit | Attending: Internal Medicine | Admitting: Internal Medicine

## 2015-08-01 ENCOUNTER — Telehealth (INDEPENDENT_AMBULATORY_CARE_PROVIDER_SITE_OTHER): Payer: Self-pay | Admitting: *Deleted

## 2015-08-01 DIAGNOSIS — J449 Chronic obstructive pulmonary disease, unspecified: Secondary | ICD-10-CM | POA: Diagnosis not present

## 2015-08-01 DIAGNOSIS — K51918 Ulcerative colitis, unspecified with other complication: Secondary | ICD-10-CM

## 2015-08-01 DIAGNOSIS — J438 Other emphysema: Secondary | ICD-10-CM | POA: Diagnosis not present

## 2015-08-01 NOTE — Telephone Encounter (Signed)
Per Dr.[name] the patient will need to have labs drawn at time of OV with Dr.[name] in April.

## 2015-08-02 ENCOUNTER — Encounter (INDEPENDENT_AMBULATORY_CARE_PROVIDER_SITE_OTHER): Payer: Self-pay

## 2015-08-06 ENCOUNTER — Encounter (HOSPITAL_COMMUNITY)
Admission: RE | Admit: 2015-08-06 | Discharge: 2015-08-06 | Disposition: A | Discharge status: Home / Self Care | Payer: Medicare Other | Source: Ambulatory Visit | Attending: Internal Medicine | Admitting: Internal Medicine

## 2015-08-06 DIAGNOSIS — J449 Chronic obstructive pulmonary disease, unspecified: Secondary | ICD-10-CM | POA: Diagnosis not present

## 2015-08-06 DIAGNOSIS — J438 Other emphysema: Secondary | ICD-10-CM | POA: Diagnosis not present

## 2015-08-07 ENCOUNTER — Encounter (INDEPENDENT_AMBULATORY_CARE_PROVIDER_SITE_OTHER): Payer: Self-pay | Admitting: *Deleted

## 2015-08-07 ENCOUNTER — Other Ambulatory Visit (INDEPENDENT_AMBULATORY_CARE_PROVIDER_SITE_OTHER): Payer: Self-pay | Admitting: *Deleted

## 2015-08-07 DIAGNOSIS — K51918 Ulcerative colitis, unspecified with other complication: Secondary | ICD-10-CM

## 2015-08-07 NOTE — Progress Notes (Signed)
[name] 81 y.o. male                58 Day Psychosocial Note   Patient psychosocial assessment reveals no barriers to participation in Pulmonary Rehab. Psychosocial areas that are currently affecting patient's rehab experience include none. Patient does exhibit positive coping skills. Offered emotional support and reassurance. Patient does feel he is making progress toward Pulmonary Rehab goals. Patient does not feel his activity level has improved in the past 30 days. Patient states family/friends have not noticed changes in his activity or mood. Patient reports feeling positive about current and projected progression in Pulmonary Rehab. After reviewing the patient's treatment plan, the patient is making progress toward Pulmonary Rehab goals. Patient's rate of progress toward rehab goals is good. Plan of action to help patient continue to work towards rehab goals include education, support and encouragement. Will continue to monitor and evaluate progress toward psychosocial goal(s).  Goal(s) in progress: Help patient work toward returning to meaningful activities that improve patient's QOL and are attainable with patient's lung disease Increase stamina  Breathe better

## 2015-08-08 ENCOUNTER — Encounter (HOSPITAL_COMMUNITY)
Admission: RE | Admit: 2015-08-08 | Discharge: 2015-08-08 | Disposition: A | Discharge status: Home / Self Care | Payer: Medicare Other | Source: Ambulatory Visit | Attending: Internal Medicine | Admitting: Internal Medicine

## 2015-08-08 DIAGNOSIS — J438 Other emphysema: Secondary | ICD-10-CM | POA: Diagnosis not present

## 2015-08-08 DIAGNOSIS — J449 Chronic obstructive pulmonary disease, unspecified: Secondary | ICD-10-CM | POA: Diagnosis not present

## 2015-08-13 ENCOUNTER — Encounter (HOSPITAL_COMMUNITY)
Admission: RE | Admit: 2015-08-13 | Discharge: 2015-08-13 | Disposition: A | Discharge status: Home / Self Care | Payer: Medicare Other | Source: Ambulatory Visit | Attending: Internal Medicine | Admitting: Internal Medicine

## 2015-08-13 DIAGNOSIS — J438 Other emphysema: Secondary | ICD-10-CM | POA: Diagnosis not present

## 2015-08-15 ENCOUNTER — Encounter (HOSPITAL_COMMUNITY)
Admission: RE | Admit: 2015-08-15 | Discharge: 2015-08-15 | Disposition: A | Discharge status: Home / Self Care | Payer: Medicare Other | Source: Ambulatory Visit | Attending: Internal Medicine | Admitting: Internal Medicine

## 2015-08-15 DIAGNOSIS — J438 Other emphysema: Secondary | ICD-10-CM | POA: Diagnosis not present

## 2015-08-20 ENCOUNTER — Encounter (HOSPITAL_COMMUNITY)
Admission: RE | Admit: 2015-08-20 | Discharge: 2015-08-20 | Disposition: A | Discharge status: Home / Self Care | Payer: Medicare Other | Source: Ambulatory Visit | Attending: Internal Medicine | Admitting: Internal Medicine

## 2015-08-20 DIAGNOSIS — J438 Other emphysema: Secondary | ICD-10-CM | POA: Diagnosis not present

## 2015-08-22 ENCOUNTER — Encounter (HOSPITAL_COMMUNITY)
Admission: RE | Admit: 2015-08-22 | Discharge: 2015-08-22 | Disposition: A | Discharge status: Home / Self Care | Payer: Medicare Other | Source: Ambulatory Visit | Attending: Internal Medicine | Admitting: Internal Medicine

## 2015-08-22 DIAGNOSIS — J438 Other emphysema: Secondary | ICD-10-CM | POA: Diagnosis not present

## 2015-08-23 DIAGNOSIS — K51918 Ulcerative colitis, unspecified with other complication: Secondary | ICD-10-CM | POA: Diagnosis not present

## 2015-08-23 DIAGNOSIS — K5190 Ulcerative colitis, unspecified, without complications: Secondary | ICD-10-CM | POA: Diagnosis not present

## 2015-08-23 LAB — HEMOGLOBIN AND HEMATOCRIT, BLOOD
HEMATOCRIT: 40.9 % (ref 38.5–50.0)
Hemoglobin: 13.5 g/dL (ref 13.2–17.1)

## 2015-08-26 ENCOUNTER — Ambulatory Visit (INDEPENDENT_AMBULATORY_CARE_PROVIDER_SITE_OTHER): Payer: Medicare Other | Admitting: Internal Medicine

## 2015-08-26 ENCOUNTER — Encounter: Payer: Self-pay | Admitting: Internal Medicine

## 2015-08-26 VITALS — BP 124/82 | HR 85 | Ht 74.5 in | Wt 158.8 lb

## 2015-08-26 DIAGNOSIS — I495 Sick sinus syndrome: Secondary | ICD-10-CM | POA: Diagnosis not present

## 2015-08-26 DIAGNOSIS — I481 Persistent atrial fibrillation: Secondary | ICD-10-CM | POA: Diagnosis not present

## 2015-08-26 DIAGNOSIS — I4819 Other persistent atrial fibrillation: Secondary | ICD-10-CM

## 2015-08-26 NOTE — Patient Instructions (Signed)
Medication Instructions:  Your physician recommends that you continue on your current medications as directed. Please refer to the Current Medication list given to you today.   Labwork: None ordered   Testing/Procedures: Your physician has requested that you have an echocardiogram. Echocardiography is a painless test that uses sound waves to create images of your heart. It provides your doctor with information about the size and shape of your heart and how well your heart's chambers and valves are working. This procedure takes approximately one hour. There are no restrictions for this procedure.    Follow-Up: Your physician wants you to follow-up in: 12 months with Dr Vallery Ridge will receive a reminder letter in the mail two months in advance. If you don't receive a letter, please call our office to schedule the follow-up appointment.  Remote monitoring is used to monitor your Pacemaker  from home. This monitoring reduces the number of office visits required to check your device to one time per year. It allows [place] to keep an eye on the functioning of your device to ensure it is working properly. You are scheduled for a device check from home on 11/25/15. You may send your transmission at any time that day. If you have a wireless device, the transmission will be sent automatically. After your physician reviews your transmission, you will receive a postcard with your next transmission date.     Any Other Special Instructions Will Be Listed Below (If Applicable).     If you need a refill on your cardiac medications before your next appointment, please call your pharmacy.

## 2015-08-26 NOTE — Progress Notes (Signed)
PCP: Tivis Ringer, MD Primary Cardiologist:  Dr Bronson Ing  CC: fatigue  [name] is a 81 y.o. male who presents today for routine electrophysiology followup.  Since last being seen in our clinic, the patient reports doing very well.   He is doing well.  Today, he denies symptoms of palpitations, chest pain,   lower extremity edema, dizziness, presyncope, or syncope.  The patient is otherwise without complaint today.   Past Medical History  Diagnosis Date  . Hypertension   . Dyslipidemia   . Persistent atrial fibrillation ([place])   . CAD (coronary artery disease)     a.  cath 5/12: mLAD 50% then tandem 50%, dLAD 30%; D1 30%; OM1 occluded with collateral flow; pRCA 25%; EF 55% with inf HK;  occluded OM likely cause of perfusion defect on Myoview  . Tachy-brady syndrome ([place])     s/p pacer  . Ulcerative colitis   . Dizziness   . Nonspecific abnormal unspecified cardiovascular function study   . GERD (gastroesophageal reflux disease)   . Arthritis   . Pelvic fracture ([place])   . Hyperlipidemia   . Depression   . Anxiety   . Gout   . Dementia     short term  . Skin disorder     blister formation  . Hearing disorder   . Hypothyroidism   . COPD (chronic obstructive pulmonary disease) ([place])   . Colitis   . Pacemaker    Past Surgical History  Procedure Laterality Date  . Implantation of a medtronic dual chamber pacemaker  02/01/06    [name] [name] MD  . Left knee arthroscopy with debridement  04/23/2006    [name] M.D  . Left knee total knee  replacement  05/13/07    [name] M.D.  . Knee surgery  2005    replacement  . Back surgery  1975  . Lumbar spine surgery    . Insert / replace / remove pacemaker      MEDTRONIC....PPM Serial Number:  [id number].....Marland KitchenPPM Model Number:  [id number]....Marland KitchenPPM DOI:  02/01/2006    . Cardiac catheterization  09/20/10    EF 55%.....Dr. Percival Spanish  . Left heart catheterization with coronary angiogram N/A 07/06/2014   Procedure: LEFT HEART CATHETERIZATION WITH CORONARY ANGIOGRAM;  Surgeon: Sinclair Grooms, MD;  Location: [facility] CATH LAB;  Service: Cardiovascular;  Laterality: N/A;  . Cataract extraction Right   . Cataract extraction w/phaco Left 12/17/2014    Procedure: CATARACT EXTRACTION PHACO AND INTRAOCULAR LENS PLACEMENT (IOC);  Surgeon: Tonny Branch, MD;  Location: [facility] ORS;  Service: Ophthalmology;  Laterality: Left;  CDE 7.44  . Ep implantable device N/A 04/02/2015    Procedure:  PPM Generator Changeout;  Surgeon: Thompson Grayer, MD;  Location: [facility] CV LAB;  Service: Cardiovascular;  Laterality: N/A;    Current Outpatient Prescriptions  Medication Sig Dispense Refill  . acetaminophen (TYLENOL) 500 MG tablet Take 500 mg by mouth every 6 (six) hours as needed for mild pain (takes as needed for arthritis pain).    . camphor-menthol (SARNA) lotion Apply 1 application topically as needed for itching.    . Cholecalciferol (VITAMIN D) 2000 UNITS CAPS Take 1 capsule by mouth daily with supper. Patient takes 5000 units.    Marland Kitchen docusate sodium (COLACE) 100 MG capsule Take 100 mg by mouth daily as needed for mild constipation.    . isosorbide mononitrate (IMDUR) 30 MG 24 hr tablet Take 1 tablet (30 mg total) by mouth 2 (  two) times daily. 180 tablet 3  . levothyroxine (SYNTHROID, LEVOTHROID) 50 MCG tablet Take 50 mcg by mouth daily before breakfast.     . loratadine (CLARITIN) 10 MG tablet Take 10 mg by mouth at bedtime.    . metoprolol tartrate (LOPRESSOR) 50 MG tablet TAKE 1 AND 1/2 TABLET (75 MG TOTAL) IN THE AM. AND TAKE 1 TABLET (50 MG) IN THE EVENING 225 tablet 3  . nitroGLYCERIN (NITROSTAT) 0.4 MG SL tablet Place 1 tablet (0.4 mg total) under the tongue every 5 (five) minutes as needed for chest pain. 25 tablet 3  . predniSONE (DELTASONE) 5 MG tablet TAKE 1 AND 1/2 TABLETS BY MOUTH IN THE MORNING WITH BREAKFAST (Patient taking differently: Patient is taking 5 mg daily.) 135 tablet 0  . rivaroxaban (XARELTO) 20  MG TABS tablet Take 1 tablet (20 mg total) by mouth daily with supper. 30 tablet 6  . traZODone (DESYREL) 50 MG tablet Take 1 tablet by mouth at bedtime.    . triamcinolone cream (KENALOG) 0.1 % Apply 1 application topically as directed.  4  . umeclidinium-vilanterol (ANORO ELLIPTA) 62.5-25 MCG/INH AEPB Inhale 1 puff into the lungs daily. 60 each 5   No current facility-administered medications for this visit.    Physical Exam: Filed Vitals:   08/26/15 1345  BP: 124/82  Pulse: 85  Height: 6' 2.5" (1.892 m)  Weight: 158 lb 12.8 oz (72.031 kg)  SpO2: 96%    GEN- The patient is well appearing, alert and oriented x 3 today.   Head- normocephalic, atraumatic Eyes-  Sclera clear, conjunctiva pink Ears- hearing intact Oropharynx- clear Lungs- Clear to ausculation bilaterally, normal work of breathing Chest- pacemaker pocket is well healed Heart- Regular rate and rhythm, no murmurs, rubs or gallops, PMI not laterally displaced GI- soft, NT, ND, + BS Extremities- no clubbing, cyanosis, or edema  Pacemaker interrogation- reviewed in detail today,  See PACEART report  Assessment and Plan:  1. Tachycardia/ Bradycardia Normal pacemaker function See Pace Art report No changes today  2. Persistent afib amiodarone stopped by Dr Bronson Ing (per patient) Today, we discussed rate vs rhythm control (as per Dr Tanna Furry recent note).  The patient is clear that he would prefer to continue rate control rather than consider tikosyn or other AADs.  Will reprogram device VVIR upon return if he remains in afib upon return to see me in 1 year Continue xarelto long term Echo to evaluate for structural abnormality  3. CAD No ischemic symptoms  Merlin Return to see me in 1 year Follow-up with Dr Bronson Ing as scheduled   Trude Mcburney

## 2015-08-27 ENCOUNTER — Encounter (HOSPITAL_COMMUNITY)
Admission: RE | Admit: 2015-08-27 | Discharge: 2015-08-27 | Disposition: A | Discharge status: Home / Self Care | Payer: Medicare Other | Source: Ambulatory Visit | Attending: Internal Medicine | Admitting: Internal Medicine

## 2015-08-27 DIAGNOSIS — J438 Other emphysema: Secondary | ICD-10-CM | POA: Diagnosis not present

## 2015-08-29 ENCOUNTER — Encounter: Payer: Self-pay | Admitting: Cardiovascular Disease

## 2015-08-29 ENCOUNTER — Encounter (HOSPITAL_COMMUNITY)
Admission: RE | Admit: 2015-08-29 | Discharge: 2015-08-29 | Disposition: A | Discharge status: Home / Self Care | Payer: Medicare Other | Source: Ambulatory Visit | Attending: Internal Medicine | Admitting: Internal Medicine

## 2015-08-29 ENCOUNTER — Ambulatory Visit (INDEPENDENT_AMBULATORY_CARE_PROVIDER_SITE_OTHER): Payer: Medicare Other | Admitting: Cardiovascular Disease

## 2015-08-29 VITALS — BP 102/62 | HR 73 | Ht 74.0 in | Wt 158.0 lb

## 2015-08-29 DIAGNOSIS — I10 Essential (primary) hypertension: Secondary | ICD-10-CM | POA: Diagnosis not present

## 2015-08-29 DIAGNOSIS — Z950 Presence of cardiac pacemaker: Secondary | ICD-10-CM

## 2015-08-29 DIAGNOSIS — J438 Other emphysema: Secondary | ICD-10-CM | POA: Diagnosis not present

## 2015-08-29 DIAGNOSIS — I25118 Atherosclerotic heart disease of native coronary artery with other forms of angina pectoris: Secondary | ICD-10-CM

## 2015-08-29 DIAGNOSIS — R5382 Chronic fatigue, unspecified: Secondary | ICD-10-CM

## 2015-08-29 DIAGNOSIS — R079 Chest pain, unspecified: Secondary | ICD-10-CM

## 2015-08-29 DIAGNOSIS — I4891 Unspecified atrial fibrillation: Secondary | ICD-10-CM

## 2015-08-29 NOTE — Patient Instructions (Signed)
Continue all current medications. Your physician wants you to follow up in: 6 months.  You will receive a reminder letter in the mail one-two months in advance.  If you don't receive a letter, please call our office to schedule the follow up appointment   

## 2015-08-29 NOTE — Progress Notes (Signed)
Patient ID: [name], male   DOB: [birth year], 81 y.o.   MRN: [id number]      SUBJECTIVE: The patient presents for routine cardiovascular follow up. In summary, he is a 81 year old male with a history of atrial fibrillation, hypertension, hyperlipidemia, coronary artery disease, and has a pacemaker for tachycardia-bradycardia syndrome.  He also has COPD and hypothyroidism.  Lexiscan Cardiolite stress test in 10/2013 demonstrated a fixed inferolateral defect which was consistent with myocardial scar versus attenuation artifact.   He underwent coronary angiography on 07/06/14 which demonstrated moderate coronary disease with diffuse multifocal disease in the 50-60% range throughout the mid and distal LAD. There was noted to be slight progression since his prior study in 2012. There was total occlusion of the first obtuse marginal with left to left collaterals and nonobstructive disease in the RCA. Overall, the LAD had not significantly progressed. Left ventriculography showed normal left ventricular systolic function with mid to distal anterolateral wall motion abnormalities likely related to occlusion of the first marginal branch. LVEF 60%.   It was not felt that his chest discomfort was ischemia related.  Saw Dr. Rayann Heman 4/17. Pacemaker functioning normally.  He has episodic chest pain but says "it doesn't bother me all the time". He remains dyspneic with exertion. He is fatigued. All these symptoms have been reported in the past and appear to be stable.  ECG today shows atrial fibrillation with right bundle branch block and left anterior fascicular block. There are T-wave inversions diffusely.   Soc: Married. Daughter [name]) moved to [place] from [place]. Daughter (Dr. Augustina Frank, dentist) lives in [place]. Son [name]) also lives in [place].   Review of Systems: As per "subjective", otherwise negative.  Allergies  Allergen Reactions  . Nsaids     Colitis, upset stomach    Current  Outpatient Prescriptions  Medication Sig Dispense Refill  . acetaminophen (TYLENOL) 500 MG tablet Take 500 mg by mouth every 6 (six) hours as needed for mild pain (takes as needed for arthritis pain).    . camphor-menthol (SARNA) lotion Apply 1 application topically as needed for itching.    . Cholecalciferol (VITAMIN D) 2000 UNITS CAPS Take 1 capsule by mouth daily with supper. Patient takes 5000 units.    Marland Kitchen docusate sodium (COLACE) 100 MG capsule Take 100 mg by mouth daily as needed for mild constipation.    . isosorbide mononitrate (IMDUR) 30 MG 24 hr tablet Take 1 tablet (30 mg total) by mouth 2 (two) times daily. 180 tablet 3  . levothyroxine (SYNTHROID, LEVOTHROID) 50 MCG tablet Take 50 mcg by mouth daily before breakfast.     . loratadine (CLARITIN) 10 MG tablet Take 10 mg by mouth at bedtime.    . metoprolol (LOPRESSOR) 50 MG tablet Take 50 mg by mouth 2 (two) times daily.    . nitroGLYCERIN (NITROSTAT) 0.4 MG SL tablet Place 1 tablet (0.4 mg total) under the tongue every 5 (five) minutes as needed for chest pain. 25 tablet 3  . predniSONE (DELTASONE) 5 MG tablet TAKE 1 AND 1/2 TABLETS BY MOUTH IN THE MORNING WITH BREAKFAST (Patient taking differently: Patient is taking 5 mg daily.) 135 tablet 0  . rivaroxaban (XARELTO) 20 MG TABS tablet Take 1 tablet (20 mg total) by mouth daily with supper. 30 tablet 6  . traZODone (DESYREL) 50 MG tablet Take 1 tablet by mouth at bedtime.    . triamcinolone cream (KENALOG) 0.1 % Apply 1 application topically as directed.  4  . umeclidinium-vilanterol (  ANORO ELLIPTA) 62.5-25 MCG/INH AEPB Inhale 1 puff into the lungs daily. 60 each 5   No current facility-administered medications for this visit.    Past Medical History  Diagnosis Date  . Hypertension   . Dyslipidemia   . Persistent atrial fibrillation ([place])   . CAD (coronary artery disease)     a.  cath 5/12: mLAD 50% then tandem 50%, dLAD 30%; D1 30%; OM1 occluded with collateral flow; pRCA 25%; EF  55% with inf HK;  occluded OM likely cause of perfusion defect on Myoview  . Tachy-brady syndrome ([place])     s/p pacer  . Ulcerative colitis   . Dizziness   . Nonspecific abnormal unspecified cardiovascular function study   . GERD (gastroesophageal reflux disease)   . Arthritis   . Pelvic fracture ([place])   . Hyperlipidemia   . Depression   . Anxiety   . Gout   . Dementia     short term  . Skin disorder     blister formation  . Hearing disorder   . Hypothyroidism   . COPD (chronic obstructive pulmonary disease) ([place])   . Colitis   . Pacemaker     Past Surgical History  Procedure Laterality Date  . Implantation of a medtronic dual chamber pacemaker  02/01/06    [name] [name] MD  . Left knee arthroscopy with debridement  04/23/2006    [name] M.D  . Left knee total knee  replacement  05/13/07    [name] M.D.  . Knee surgery  2005    replacement  . Back surgery  1975  . Lumbar spine surgery    . Insert / replace / remove pacemaker      MEDTRONIC....PPM Serial Number:  [id number].....Marland KitchenPPM Model Number:  [id number]....Marland KitchenPPM DOI:  02/01/2006    . Cardiac catheterization  09/20/10    EF 55%.....Dr. Percival Spanish  . Left heart catheterization with coronary angiogram N/A 07/06/2014    Procedure: LEFT HEART CATHETERIZATION WITH CORONARY ANGIOGRAM;  Surgeon: Sinclair Grooms, MD;  Location: [facility] CATH LAB;  Service: Cardiovascular;  Laterality: N/A;  . Cataract extraction Right   . Cataract extraction w/phaco Left 12/17/2014    Procedure: CATARACT EXTRACTION PHACO AND INTRAOCULAR LENS PLACEMENT (IOC);  Surgeon: Tonny Branch, MD;  Location: [facility] ORS;  Service: Ophthalmology;  Laterality: Left;  CDE 7.44  . Ep implantable device N/A 04/02/2015    Procedure:  PPM Generator Changeout;  Surgeon: Thompson Grayer, MD;  Location: [facility] CV LAB;  Service: Cardiovascular;  Laterality: N/A;    Social History   Social History  . Marital Status: Married    Spouse Name: [name]  . Number  of Children: 3  . Years of Education: 12   Occupational History  . retired    Social History Main Topics  . Smoking status: Former Smoker -- 1.00 packs/day for 15 years    Types: Cigarettes    Start date: 05/13/1955    Quit date: 05/12/1983  . Smokeless tobacco: Never Used  . Alcohol Use: No  . Drug Use: No  . Sexual Activity: Yes    Birth Control/ Protection: None   Other Topics Concern  . Not on file   Social History Narrative   One cup of caffeine daily     Filed Vitals:   08/29/15 1457  BP: 102/62  Pulse: 73  Height: 6' 2"  (1.88 m)  Weight: 158 lb (71.668 kg)  SpO2: 97%    PHYSICAL EXAM General:  NAD HEENT: Normal. Neck: No JVD, no thyromegaly. Lungs: No rales or wheezes. CV: Nondisplaced PMI. Irregular rhythm, normal rate, normal S1/S2, no S3, no murmur. No pretibial or periankle edema.  Abdomen: Soft, nontender, no distention.  Neurologic: Alert and oriented x 3.  Psych: Normal affect. Skin: Normal. Musculoskeletal: No gross deformities. Extremities: No clubbing or cyanosis.   ECG: Most recent ECG reviewed.      ASSESSMENT AND PLAN: 1. CAD: Cath results noted above. Continue metoprolol and nitrates. If pain gets worse, would switch Imdur to Ranexa.   2. Atrial fibrillation: Normal device function. Continue metoprolol and Xarelto. Patient prefers rate control strategy.  3. Essential HTN: Controlled on current therapy. No changes.  4. Pacemaker: Normal device function. Follows with Dr. Rayann Heman.  Dispo: fu 6 months.  Kate Sable, M.D., F.A.C.C.

## 2015-09-03 ENCOUNTER — Ambulatory Visit (INDEPENDENT_AMBULATORY_CARE_PROVIDER_SITE_OTHER): Payer: Medicare Other | Admitting: Internal Medicine

## 2015-09-03 ENCOUNTER — Encounter (HOSPITAL_COMMUNITY): Payer: Medicare Other

## 2015-09-03 ENCOUNTER — Encounter: Payer: Self-pay | Admitting: Internal Medicine

## 2015-09-03 ENCOUNTER — Other Ambulatory Visit: Payer: Medicare Other

## 2015-09-03 VITALS — BP 102/62 | HR 72 | Ht 73.0 in | Wt 157.0 lb

## 2015-09-03 DIAGNOSIS — I482 Chronic atrial fibrillation, unspecified: Secondary | ICD-10-CM

## 2015-09-03 DIAGNOSIS — J449 Chronic obstructive pulmonary disease, unspecified: Secondary | ICD-10-CM

## 2015-09-03 LAB — PULMONARY FUNCTION TEST
DL/VA % pred: 57 %
DL/VA: 2.72 ml/min/mmHg/L
DLCO COR % PRED: 49 %
DLCO COR: 17.94 ml/min/mmHg
DLCO UNC % PRED: 46 %
DLCO UNC: 17.07 ml/min/mmHg
FEF 25-75 POST: 1.21 L/s
FEF 25-75 Pre: 1.03 L/sec
FEF2575-%Change-Post: 17 %
FEF2575-%PRED-POST: 52 %
FEF2575-%PRED-PRE: 44 %
FEV1-%Change-Post: 1 %
FEV1-%PRED-POST: 75 %
FEV1-%Pred-Pre: 73 %
FEV1-POST: 2.47 L
FEV1-Pre: 2.43 L
FEV1FVC-%CHANGE-POST: 0 %
FEV1FVC-%PRED-PRE: 77 %
FEV6-%CHANGE-POST: 2 %
FEV6-%Pred-Post: 100 %
FEV6-%Pred-Pre: 98 %
FEV6-Post: 4.3 L
FEV6-Pre: 4.21 L
FEV6FVC-%Change-Post: 0 %
FEV6FVC-%Pred-Post: 103 %
FEV6FVC-%Pred-Pre: 103 %
FVC-%CHANGE-POST: 1 %
FVC-%PRED-PRE: 94 %
FVC-%Pred-Post: 96 %
FVC-PRE: 4.32 L
FVC-Post: 4.41 L
PRE FEV1/FVC RATIO: 56 %
Post FEV1/FVC ratio: 56 %
Post FEV6/FVC ratio: 98 %
Pre FEV6/FVC Ratio: 97 %
RV % pred: 117 %
RV: 3.29 L
TLC % PRED: 104 %
TLC: 7.98 L

## 2015-09-03 NOTE — Progress Notes (Signed)
Subjective:    Patient ID: [name], male    DOB: July 25, 1934, 81 y.o.   MRN: [id number]  HPI 09/04/14- Dr [name] Patient comes in today for follow-up of his known COPD. He was started on stiolto at the last visit, and also referred to pulmonary rehabilitation. He has been participating in the program, and also staying on his bronchodilator regimen. He feels that it has helped his breathing, but has not been overwhelming. He denies any significant cough, chest congestion, or mucus.  03/04/15- 80 yoM never smoker followed for COPD, complicated by history of asbestos exposure, CAD/angina/pacemaker, chronic pruritus FOLLOWS FOR: Former [place] pt-completed pulm rehab at [facility] like to go back for another session set if able (? insurance will pay).  Has had flu shot              wife here He feels exercise tolerance and breathing have been stable through the summer with little cough. Has sister with COPD who never smoked. No record of alpha-1 testing. He has a Pro-air rescue inhaler available but rarely uses it. Denies wheeze or phlegm. On maintenance prednisone 5 mg daily for chronic itching.  05/16/15-   81 yoM former smoker followed for COPD, complicated by history of asbestos exposure, CAD/angina/pacemaker, chronic pruritus   wife here  ACUTE VISIT: Chest pain in lung area; had pacemaker checked out 04-02-15 and that was working properly. Will need alternative to inhaler-list attached to last OV notes Describes over several months 2 or 3 episodes of "electric" twinge, very transient, along left lower costal margin. Not recognized as exertional or related to reaching or lifting. He wanted reassurance it was not his heart. Left upper anterior chest wall is still somewhat sore from pacemaker replacement in new pocket in November. The 2 pains seem unrelated. CXR 03/04/15- IMPRESSION: COPD. There is no acute cardiopulmonary abnormality. Electronically Signed  By: David Martinique M.D.   On: 03/04/2015 14:22     09/03/2015-81 year old male former smoker followed for COPD, complicated by history asbestos exposure,      CAD/angina/pacemaker, chronic pruritus    FOLLOWS FOR: Review PFT with patient-previous PFT attached; pt states his breathing remains the same since OV in 05-2015. Pt gives out if he goes too fast.  PFT- 09/03/2015-mild obstructive airways disease with insignificant response to bronchodilator, normal lung volume. Diffusion severely reduced at 46%. Flows have substantially improved since 10/18/2013.  ROS-see HPI   Negative unless "+" Constitutional:    weight loss, night sweats, fevers, chills, fatigue, lassitude. HEENT:    headaches, difficulty swallowing, tooth/dental problems, sore throat,       sneezing, + itching, ear ache, nasal congestion, post nasal drip, snoring CV:    chest pain, orthopnea, PND, swelling in lower extremities, anasarca,                                                       dizziness, palpitations Resp:   + shortness of breath with exertion or at rest.                productive cough,   non-productive cough, coughing up of blood.              change in color of mucus.  wheezing.   Skin:    rash or lesions. GI:  No-  heartburn, indigestion, abdominal pain, nausea, vomiting,  GU:  MS:   joint pain, stiffness, decreased range of motion, Neuro-     nothing unusual Psych:  change in mood or affect.  depression or anxiety.   memory loss.     Objective:  OBJ- Physical Exam General- Alert, Oriented, Affect-appropriate, Distress- none acute Skin- rash-none, lesions- none, excoriation- none Lymphadenopathy- none Head- atraumatic            Eyes- Gross vision intact, PERRLA, conjunctivae and secretions clear            Ears- Hearing, canals-normal            Nose- Clear, no-Septal dev, mucus, polyps, erosion, perforation             Throat- Mallampati II , mucosa clear , drainage- none, tonsils- atrophic Neck- flexible , trachea midline, no  stridor , thyroid nl, carotid no bruit Chest - symmetrical excursion , unlabored           Heart/CV- RRR , no murmur , no gallop  , no rub, nl s1 s2                           - JVD- none , edema- none, stasis changes- none, varices- none           Lung- clear to P&A/diminished, wheeze- none, cough- none , dullness-none, rub- none           Chest wall-  + pacemaker left chest. Not tender along left lower costal margin Abd-  Br/ Gen/ Rectal- Not done, not indicated Extrem- cyanosis- none, clubbing, none, atrophy- none, strength- nl Neuro- grossly intact to observation  Assessment & Plan:

## 2015-09-03 NOTE — Patient Instructions (Signed)
Recommend you continue exercise such as [facility]  Order- lab- alpha 1 antitrypsin level   Dx COPD  Continue Anoro, 1 puff, once daily

## 2015-09-03 NOTE — Assessment & Plan Note (Signed)
Airflow has significantly improved since PFT of 2015, consistent with response to Anoro. Diffusion remains severely reduced, reflecting his remote significant smoking history and his cardiac disease. He is not known to have been anemic. Plan-he is finishing pulmonary rehabilitation. We discussed availability of maintenance program versus going with his wife to [facility] program at [place]. Continue Anoro

## 2015-09-03 NOTE — Progress Notes (Signed)
PFT done today. 09/03/2015

## 2015-09-03 NOTE — Assessment & Plan Note (Signed)
Rhythm regular to palpation at this visit with pacemaker

## 2015-09-04 ENCOUNTER — Other Ambulatory Visit: Payer: Self-pay

## 2015-09-04 ENCOUNTER — Ambulatory Visit (INDEPENDENT_AMBULATORY_CARE_PROVIDER_SITE_OTHER): Payer: Medicare Other

## 2015-09-04 DIAGNOSIS — I481 Persistent atrial fibrillation: Secondary | ICD-10-CM | POA: Diagnosis not present

## 2015-09-04 DIAGNOSIS — I4819 Other persistent atrial fibrillation: Secondary | ICD-10-CM

## 2015-09-05 ENCOUNTER — Encounter (HOSPITAL_COMMUNITY)
Admission: RE | Admit: 2015-09-05 | Discharge: 2015-09-05 | Disposition: A | Discharge status: Home / Self Care | Payer: Medicare Other | Source: Ambulatory Visit | Attending: Internal Medicine | Admitting: Internal Medicine

## 2015-09-05 DIAGNOSIS — J438 Other emphysema: Secondary | ICD-10-CM | POA: Diagnosis not present

## 2015-09-07 LAB — ALPHA-1 ANTITRYPSIN PHENOTYPE: A1 ANTITRYPSIN: 187 mg/dL (ref 83–199)

## 2015-09-09 NOTE — Progress Notes (Addendum)
[name] 81 y.o. male                 150 Day Psychosocial Note  Patient psychosocial assessment reveals no barriers to participation in Pulmonary Rehab.  Patient does exhibit positive coping skills. Offered emotional support and reassurance. Patient does feel he is making progress toward Pulmonary Rehab goals. Patient does not feel his activity level has improved in the past 30 days. Patient states family/friends have not noticed changes in his activity or mood. Patient reports feeling positive about current and projected progression in Pulmonary Rehab. After reviewing the patient's treatment plan, the patient is slowly making progress toward Pulmonary Rehab goals. Patient's rate of progress toward rehab goals is fair. Plan of action to help patient continue to work towards rehab goals include education, support and encouragement. Will continue to monitor and evaluate progress toward psychosocial goal(s).  Goal(s) in progress:  Help patient work toward returning to meaningful activities that improve patient's QOL and are attainable with patient's lung disease  Increase stamina  Breathe better

## 2015-09-10 ENCOUNTER — Encounter (HOSPITAL_COMMUNITY)
Admission: RE | Admit: 2015-09-10 | Discharge: 2015-09-10 | Disposition: A | Discharge status: Home / Self Care | Payer: Medicare Other | Source: Ambulatory Visit | Attending: Internal Medicine | Admitting: Internal Medicine

## 2015-09-10 DIAGNOSIS — J438 Other emphysema: Secondary | ICD-10-CM | POA: Diagnosis not present

## 2015-09-12 ENCOUNTER — Encounter (HOSPITAL_COMMUNITY): Payer: Medicare Other

## 2015-09-17 ENCOUNTER — Encounter (HOSPITAL_COMMUNITY): Payer: Medicare Other

## 2015-10-15 DIAGNOSIS — L570 Actinic keratosis: Secondary | ICD-10-CM | POA: Diagnosis not present

## 2015-10-15 DIAGNOSIS — Z85828 Personal history of other malignant neoplasm of skin: Secondary | ICD-10-CM | POA: Diagnosis not present

## 2015-10-17 ENCOUNTER — Telehealth: Payer: Self-pay | Admitting: Internal Medicine

## 2015-10-17 NOTE — Telephone Encounter (Signed)
Transmission received. Normal device function. >99% AT/AF- on xarelto. No ventricular episodes. I have made Ms. Wadel aware of findings. She says that she will continue to monitor him and if he continues to c/o chest discomfort or if he develops other symptoms she will take him to [facility]. I am agreeable with her plan.

## 2015-10-17 NOTE — Telephone Encounter (Signed)
I spoke with [name] because a transmission has not been received. I can hear the transmission taking place over the phone. I informed her that her husband has a pacemaker which is not capable of delivering a shock. She says that it "just what he told me". I have advised them to go the ER if he continues to have chest discomfort and develops shortness of breath. The monitor is connected through the landline which we are talking on, so I have advised her to hang up so the transmission will send. If the monitor is still beeping after 10 mins then she should call [facility] [employer]. She is agreeable. I will wait for another 10 mins on the transmission and call back if received.

## 2015-10-17 NOTE — Telephone Encounter (Signed)
DEVICE fired off last night during his sleep. It woke him up with the shock. He has been experiencing some chest discomfort today.  Just told wife about it and she called [place].

## 2015-10-17 NOTE — Telephone Encounter (Signed)
Called and spoke w/ pt wife and she informed me that pt has some pain in his chest. Pt wife stated that pt doesn't seem to have any shortness of breath or dizziness. Instructed her how to send a remote transmission. Informed pt wife that we would be unable to stay on the phone while the monitor is transmitting b/c it is hooked up to the landline phone. Informed her that once we receive the transmission I will have someone call her and discuss results. Pt wife verbalized understanding.

## 2015-10-28 NOTE — Progress Notes (Signed)
Patient is discharged from [place] and Pulmonary program today, 09/10/15 with 36 sessions.  He achieved LTG of 30 minutes of aerobic exercise at max met level of.3.47. Patient has not met with dietician.  Discharge instructions have been reviewed in detail and patient expressed an understanding of material given.  Patient plans to exercise at home. Cardiac Rehab will make 1 month, 6 month and 1 year call backs.  Patient had no complaints of any abnormal S/S or pain on their exit visit.

## 2015-10-28 NOTE — Progress Notes (Signed)
Pulmonary Rehabilitation Program Outcomes Report   Orientation:  03/28/15  Graduate Date:  09/10/15 Discharge Date:  09/10/15 # of sessions completed: 36   Pulmonologist: [name] Family MD:  Vickii Penna Time:  0300  A.  Exercise Program:  Tolerates exercise @ 3.47 METS for 15 minutes, Walk Test Results:  Post: 3.30 mets, Improved functional capacity  from 3.11 mets to 3.30 mets % and Needs encouragement on exercise program  B.  Mental Health:  Good mental attitude and PHQ-9: 11  C.  Education/Instruction/Skills  Accurately checks own pulse.  Rest:  70  Exercise:  111  Uses Perceived Exertion Scale and/or Dyspnea Scale  D.  Nutrition/Weight Control/Body Composition:  Adherence to prescribed nutrition program: good  and Patient has lost 3.5 kg   E.  Blood Lipids   No results found for: CHOL, HDL, LDLCALC, LDLDIRECT, TRIG, CHOLHDL  F.  Lifestyle Changes:  Making positive lifestyle changes and Not smoking:  Quit 1984  G.  Symptoms noted with exercise:  Asymptomatic  Report Completed By:  Stevphen Rochester RN   Comments:  Patient has graduated from [facility] Re[facility] today. Patient has progressed some while in the program. Patient has dementia.  Patient states he feels better than when first started and can breathe better but has not noticed much of a change in energy level. Patient plans to exercise at home.

## 2015-10-28 NOTE — Addendum Note (Signed)
Encounter addended by: Cathie Olden, RN on: 10/28/2015 10:11 AM<BR>     Documentation filed: Clinical Notes, Notes Section

## 2015-10-29 NOTE — Addendum Note (Signed)
Encounter addended by: Gean Maidens on: 10/29/2015  1:40 PM<BR>     Documentation filed: Notes Section

## 2015-10-29 NOTE — Addendum Note (Signed)
Encounter addended by: Gean Maidens on: 10/29/2015 10:02 AM<BR>     Documentation filed: Clinical Notes

## 2015-10-29 NOTE — Progress Notes (Signed)
[facility] Pulmonary Rehabilitation                                                             Final/Discharge Outcome Results  Anthropometrics: . Height (inches): 74 . Weight (kg): 71.3  ? This is a change of 3.5kg from entrance. . Grip strength was measured using a Dynamometer.  The patient's discharge score was a 73.   ? This is a change of 98.6% from entrance.  Functional Status/Exercise Capacity: . [name] had a resting heart rate of 68 BPM, a resting blood pressure of 110/60, and an oxygen saturation of 95 % on 0 liters of O2.  [name] performed a discharge 6-minute walk test on 09/10/15.  The patient completed 1600 feet in 6 minutes with 0 rest breaks.  This quantifies 3.30 METS. The patient's goal is to add 82 feet onto the baseline 6MWT.   ? The patient increased their 6-minute walk test distance by 150 feet and their MET level by .19 METs.  Dyspnea Measures: . The [name] is a simple and standardized method of classifying disability in patients with COPD.  The assessment correlates disability and dyspnea.  Upon discharge the patients resting score was 2. The scale is provided below.  ? This is a change of 33.3% from entrance.  0= I only get breathless with strenuous exercise. 1= I get short of breath when hurrying on level ground or walking up a slight incline. 2= On level ground, I walk slower than people of the same age because of breathlessness, or have to stop for breath when walking at my own pace. 3= I stop for breath after walking 100 yards or after a few minutes on level ground. 4=I am too breathless to leave the house or I am breathless when dressing.   . The patient completed the William ([facility] [place]).  This questionnaire relates activities of daily living and shortness of breath.  The score ranges from 0-120, a higher score relates to severe shortness of breath during activities  of daily living. The patient's score at discharge was 68. ? This is a change of -32% from entrance.  Quality of Life: . [name] Quality of Life Index Pulmonary Version is used to assess the patients satisfaction in different domains of their life; health and functioning, socioeconomic, psychological/spiritual, and family. The overall score is recorded out of 30 points.  The patient's goal is to achieve an overall score of 21 or higher.  [name] received a 10.38 upon discharge.  ? This is a change of -42.25% from entrance.  . The Patient Health Questionnaire (PHQ-2) is a first step approach for the screening of depression.  If the patient scores positive on the PHQ-2 the patient should be further assessed with the PHQ-9.  The Patient Health Questionnaire (PHQ-9) assesses the degree of depression.  Depression is important to monitor and track in pulmonary patients due to its prevalence in the population.  If the patient advances to the PHQ-9 the goal is to score less than 4 on this assessment.  [name] scored a 8 at discharge. ? This is a change of 50% from entrance.   Clinical Assessment Tools: . The COPD Assessment Test (CAT) is a measurement tool to quantify  how much of an impact the disease has on the patient's life.  This assessment aids the Pulmonary Rehab Team in designing the patients individualized treatment plan.  A CAT score ranges from 0-40.  A score of 10 or below indicates that COPD has a low impact on the patient's life whereas a score of 30 or higher indicates a severe impact. The patient's goal is a decrease of 1 point from entrance to discharge.  [name] had a CAT score of 14 upon discharge.   ? This is a change of 0 from entrance.  Nutrition: . The "Rate My Plate" is a dietary assessment that quantifies the balance of a patient's diet.  This tool allows the Pulmonary Rehab Team to key in on the areas of the patient's diet that needs improving.  The team can then focus their  nutritional education on those areas.  If the patient scores 24-40, this means there are many ways they can make their eating habits healthier, 41-57 states that there are some ways they can make their eating habits healthier and a score of 58-72 states that they are making many healthy choices.  The patient's goal is to achieve a score of 49 or higher on this assessment.  [name] scored a 23 upon discharge.   ? This is a change of 1.13% from entrance.  Oxygen Compliance: . Patient is currently on 0 liters at rest, 0 liters at night, and 0 liters for exercise.  [name] is currently using neither cpap/bipap at night.  The patient is currently very compliant. The patient states that they do not have barriers that keep them from using their oxygen.  These barriers include none at this time.   ? This is no change result from entrance.    Education: . [name] attended 13 education classes.  [name] completed a discharge educational assessment and achieved a score of 4/14.  ? This is a change of -35% from entrance.   Smoking Cessation:  Does not smoke  Exercise: . [name] was provided with an individualized Home Exercise Prescription (HEP) at the entrance of the program.  The patient's goal is to be exercising 30-60 minutes, 3-5 days per week. Upon discharge the patient is exercising 3 days at home.  This is a change of 0 from entrance.  After graduation from Pulmonary Rehab, [name] will continue exercising at home.

## 2015-11-19 ENCOUNTER — Telehealth: Payer: Self-pay | Admitting: Internal Medicine

## 2015-11-19 DIAGNOSIS — Z85828 Personal history of other malignant neoplasm of skin: Secondary | ICD-10-CM | POA: Diagnosis not present

## 2015-11-19 DIAGNOSIS — D485 Neoplasm of uncertain behavior of skin: Secondary | ICD-10-CM | POA: Diagnosis not present

## 2015-11-19 DIAGNOSIS — L259 Unspecified contact dermatitis, unspecified cause: Secondary | ICD-10-CM | POA: Diagnosis not present

## 2015-11-19 DIAGNOSIS — L988 Other specified disorders of the skin and subcutaneous tissue: Secondary | ICD-10-CM | POA: Diagnosis not present

## 2015-11-19 NOTE — Telephone Encounter (Signed)
Called and spoke with rachel and she stated that the pt could not remember when his last cbc or cmet was.  Advised her that the cbc was last done at his last hospital stay in 01/2015.  Nothing further is needed.

## 2015-11-25 ENCOUNTER — Other Ambulatory Visit: Payer: Self-pay | Admitting: *Deleted

## 2015-11-25 ENCOUNTER — Ambulatory Visit (INDEPENDENT_AMBULATORY_CARE_PROVIDER_SITE_OTHER): Payer: Medicare Other | Admitting: *Deleted

## 2015-11-25 ENCOUNTER — Other Ambulatory Visit: Payer: Self-pay | Admitting: Internal Medicine

## 2015-11-25 DIAGNOSIS — Z950 Presence of cardiac pacemaker: Secondary | ICD-10-CM | POA: Diagnosis not present

## 2015-11-25 DIAGNOSIS — I495 Sick sinus syndrome: Secondary | ICD-10-CM | POA: Diagnosis not present

## 2015-11-25 MED ORDER — RIVAROXABAN 20 MG PO TABS: 20.0000 mg | ORAL_TABLET | Freq: Every day | ORAL | Status: DC

## 2015-11-25 NOTE — Progress Notes (Signed)
Remote pacemaker transmission.   

## 2015-11-26 LAB — CUP PACEART REMOTE DEVICE CHECK
Battery Voltage: 3.01 V
Brady Statistic RA Percent Paced: 0 %
Date Time Interrogation Session: 20170718170429
Implantable Lead Implant Date: 20070924
Implantable Lead Location: 753860
Implantable Lead Model: 5076
Lead Channel Impedance Value: 460 Ohm
MDC IDC LEAD IMPLANT DT: 20070924
MDC IDC LEAD LOCATION: 753859
MDC IDC MSMT LEADCHNL RV IMPEDANCE VALUE: 410 Ohm
MDC IDC MSMT LEADCHNL RV SENSING INTR AMPL: 10.8 mV
MDC IDC PG SERIAL: 7826824
MDC IDC STAT BRADY RV PERCENT PACED: 13 %

## 2015-11-27 ENCOUNTER — Encounter: Payer: Self-pay | Admitting: Cardiology

## 2015-12-11 ENCOUNTER — Encounter: Payer: Self-pay | Admitting: Cardiology

## 2015-12-17 DIAGNOSIS — L280 Lichen simplex chronicus: Secondary | ICD-10-CM | POA: Diagnosis not present

## 2015-12-19 DIAGNOSIS — I480 Paroxysmal atrial fibrillation: Secondary | ICD-10-CM | POA: Diagnosis not present

## 2015-12-19 DIAGNOSIS — E038 Other specified hypothyroidism: Secondary | ICD-10-CM | POA: Diagnosis not present

## 2015-12-19 DIAGNOSIS — L309 Dermatitis, unspecified: Secondary | ICD-10-CM | POA: Diagnosis not present

## 2015-12-19 DIAGNOSIS — R634 Abnormal weight loss: Secondary | ICD-10-CM | POA: Diagnosis not present

## 2015-12-19 DIAGNOSIS — F39 Unspecified mood [affective] disorder: Secondary | ICD-10-CM | POA: Diagnosis not present

## 2015-12-19 DIAGNOSIS — I2510 Atherosclerotic heart disease of native coronary artery without angina pectoris: Secondary | ICD-10-CM | POA: Diagnosis not present

## 2015-12-19 DIAGNOSIS — I10 Essential (primary) hypertension: Secondary | ICD-10-CM | POA: Diagnosis not present

## 2015-12-19 DIAGNOSIS — K529 Noninfective gastroenteritis and colitis, unspecified: Secondary | ICD-10-CM | POA: Diagnosis not present

## 2015-12-19 DIAGNOSIS — J449 Chronic obstructive pulmonary disease, unspecified: Secondary | ICD-10-CM | POA: Diagnosis not present

## 2016-01-03 DIAGNOSIS — K5180 Other ulcerative colitis without complications: Secondary | ICD-10-CM | POA: Diagnosis not present

## 2016-01-03 DIAGNOSIS — L409 Psoriasis, unspecified: Secondary | ICD-10-CM | POA: Diagnosis not present

## 2016-01-03 DIAGNOSIS — R21 Rash and other nonspecific skin eruption: Secondary | ICD-10-CM | POA: Diagnosis not present

## 2016-01-03 DIAGNOSIS — R5382 Chronic fatigue, unspecified: Secondary | ICD-10-CM | POA: Diagnosis not present

## 2016-01-22 ENCOUNTER — Telehealth: Payer: Self-pay | Admitting: Cardiovascular Disease

## 2016-01-22 MED ORDER — RIVAROXABAN 20 MG PO TABS: 20.0000 mg | ORAL_TABLET | Freq: Every day | ORAL | 0 refills | Status: DC

## 2016-01-22 NOTE — Telephone Encounter (Signed)
Discussed issue with [name] (wife).  States they are having to pay about $400.00 now & can not afford that.  Suggested we try applying for financial assistance since in the dough-nut whole first, before changing medications.  We will give samples in the meantime.

## 2016-01-22 NOTE — Telephone Encounter (Signed)
[name] is wanting to go back on coumdin. States that the (XARELTO) 20 MG is too expensive.  States that he in the donut hole now.  Please call patient back.

## 2016-02-06 DIAGNOSIS — R490 Dysphonia: Secondary | ICD-10-CM | POA: Diagnosis not present

## 2016-02-11 ENCOUNTER — Ambulatory Visit (INDEPENDENT_AMBULATORY_CARE_PROVIDER_SITE_OTHER): Payer: Medicare Other | Admitting: Cardiovascular Disease

## 2016-02-11 ENCOUNTER — Encounter: Payer: Self-pay | Admitting: Cardiovascular Disease

## 2016-02-11 VITALS — BP 118/80 | HR 55 | Ht 73.0 in | Wt 150.0 lb

## 2016-02-11 DIAGNOSIS — I495 Sick sinus syndrome: Secondary | ICD-10-CM | POA: Diagnosis not present

## 2016-02-11 DIAGNOSIS — Z950 Presence of cardiac pacemaker: Secondary | ICD-10-CM

## 2016-02-11 DIAGNOSIS — Z23 Encounter for immunization: Secondary | ICD-10-CM | POA: Diagnosis not present

## 2016-02-11 DIAGNOSIS — I25118 Atherosclerotic heart disease of native coronary artery with other forms of angina pectoris: Secondary | ICD-10-CM

## 2016-02-11 DIAGNOSIS — E7800 Pure hypercholesterolemia, unspecified: Secondary | ICD-10-CM

## 2016-02-11 DIAGNOSIS — I209 Angina pectoris, unspecified: Secondary | ICD-10-CM

## 2016-02-11 DIAGNOSIS — I482 Chronic atrial fibrillation, unspecified: Secondary | ICD-10-CM

## 2016-02-11 DIAGNOSIS — I10 Essential (primary) hypertension: Secondary | ICD-10-CM

## 2016-02-11 NOTE — Progress Notes (Signed)
SUBJECTIVE: The patient presents for routine cardiovascular follow up. In summary, he is a 81 year old male with a history of atrial fibrillation, hypertension, hyperlipidemia, coronary artery disease, and has a pacemaker for tachycardia-bradycardia syndrome.  He also has COPD and hypothyroidism.  Lexiscan Cardiolite stress test in 10/2013 demonstrated a fixed inferolateral defect which was consistent with myocardial scar versus attenuation artifact.   He underwent coronary angiography on 07/06/14 which demonstrated moderate coronary disease with diffuse multifocal disease in the 50-60% range throughout the mid and distal LAD. There was noted to be slight progression since his prior study in 2012. There was total occlusion of the first obtuse marginal with left to left collaterals and nonobstructive disease in the RCA. Overall, the LAD had not significantly progressed. Left ventriculography showed normal left ventricular systolic function with mid to distal anterolateral wall motion abnormalities likely related to occlusion of the first marginal branch. LVEF 60%.   It was not felt that his chest discomfort was ischemia related.  He is doing well. He is being treated for psoriasis with intravenous methotrexate. Denies chest pain, palpitations, bleeding problems, and shortness of breath.  Soc: Married. Daughter [name]) moved to [place] from [place]. Daughter (Dr. Augustina Mood, dentist) lives in [place]. Son [name]) also lives in [place].   Review of Systems: As per "subjective", otherwise negative.  Allergies  Allergen Reactions  . Nsaids     Colitis, upset stomach    Current Outpatient Prescriptions  Medication Sig Dispense Refill  . acetaminophen (TYLENOL) 500 MG tablet Take 500 mg by mouth every 6 (six) hours as needed for mild pain (takes as needed for arthritis pain).    Jearl Klinefelter ELLIPTA 62.5-25 MCG/INH AEPB INHALE ONE PUFF INTO THE LUNGS DAILY. 60 each 5  . camphor-menthol  (SARNA) lotion Apply 1 application topically as needed for itching.    . Cholecalciferol (VITAMIN D) 2000 UNITS CAPS Take 1 capsule by mouth daily with supper. Patient takes 5000 units.    Marland Kitchen docusate sodium (COLACE) 100 MG capsule Take 100 mg by mouth daily as needed for mild constipation.    . isosorbide mononitrate (IMDUR) 30 MG 24 hr tablet Take 1 tablet (30 mg total) by mouth 2 (two) times daily. 180 tablet 3  . levothyroxine (SYNTHROID, LEVOTHROID) 50 MCG tablet Take 50 mcg by mouth daily before breakfast.     . loratadine (CLARITIN) 10 MG tablet Take 10 mg by mouth at bedtime.    . methotrexate (50 MG/ML) 1 g injection Inject 0.5 mg into the vein once a week.    . metoprolol (LOPRESSOR) 50 MG tablet Take 50 mg by mouth 2 (two) times daily.    . nitroGLYCERIN (NITROSTAT) 0.4 MG SL tablet Place 1 tablet (0.4 mg total) under the tongue every 5 (five) minutes as needed for chest pain. 25 tablet 3  . rivaroxaban (XARELTO) 20 MG TABS tablet Take 1 tablet (20 mg total) by mouth daily with supper. 28 tablet 0  . traZODone (DESYREL) 50 MG tablet Take 1 tablet by mouth at bedtime.    . triamcinolone cream (KENALOG) 0.1 % Apply 1 application topically as directed.  4   No current facility-administered medications for this visit.     Past Medical History:  Diagnosis Date  . Anxiety   . Arthritis   . CAD (coronary artery disease)    a.  cath 5/12: mLAD 50% then tandem 50%, dLAD 30%; D1 30%; OM1 occluded with collateral flow; pRCA 25%; EF 55%  with inf HK;  occluded OM likely cause of perfusion defect on Myoview  . Colitis   . COPD (chronic obstructive pulmonary disease) ([place])   . Dementia    short term  . Depression   . Dizziness   . Dyslipidemia   . GERD (gastroesophageal reflux disease)   . Gout   . Hearing disorder   . Hyperlipidemia   . Hypertension   . Hypothyroidism   . Nonspecific abnormal unspecified cardiovascular function study   . Pacemaker   . Pelvic fracture ([place])   .  Persistent atrial fibrillation ([place])   . Skin disorder    blister formation  . Tachy-brady syndrome ([place])    s/p pacer  . Ulcerative colitis     Past Surgical History:  Procedure Laterality Date  . BACK SURGERY  1975  . CARDIAC CATHETERIZATION  09/20/10   EF 55%.....Dr. Percival Spanish  . CATARACT EXTRACTION Right   . CATARACT EXTRACTION W/PHACO Left 12/17/2014   Procedure: CATARACT EXTRACTION PHACO AND INTRAOCULAR LENS PLACEMENT (IOC);  Surgeon: Tonny Branch, MD;  Location: [facility];  Service: Ophthalmology;  Laterality: Left;  CDE 7.44  . EP IMPLANTABLE DEVICE N/A 04/02/2015   Procedure:  PPM Generator Changeout;  Surgeon: Thompson Grayer, MD;  Location: [facility];  Service: Cardiovascular;  Laterality: N/A;  . Implantation of a Medtronic dual chamber pacemaker  02/01/06   [name] [name] MD  . Randolm Idol / REPLACE / REMOVE PACEMAKER     MEDTRONIC....PPM Serial Number:  [id number].....Marland KitchenPPM Model Number:  [id number]....Marland KitchenPPM DOI:  02/01/2006    . KNEE SURGERY  2005   replacement  . LEFT HEART CATHETERIZATION WITH CORONARY ANGIOGRAM N/A 07/06/2014   Procedure: LEFT HEART CATHETERIZATION WITH CORONARY ANGIOGRAM;  Surgeon: Sinclair Grooms, MD;  Location: [facility] CATH LAB;  Service: Cardiovascular;  Laterality: N/A;  . Left knee arthroscopy with debridement  04/23/2006   [name] M.D  . Left knee total knee  replacement  05/13/07   [name] M.D.  . LUMBAR SPINE SURGERY      Social History   Social History  . Marital status: Married    Spouse name: [name]  . Number of children: 3  . Years of education: 12   Occupational History  . retired Retired   Social History Main Topics  . Smoking status: Former Smoker    Packs/day: 1.00    Years: 15.00    Types: Cigarettes    Start date: 05/13/1955    Quit date: 05/12/1983  . Smokeless tobacco: Never Used  . Alcohol use No  . Drug use: No  . Sexual activity: Yes    Birth control/ protection: None   Other Topics Concern  . Not on  file   Social History Narrative   One cup of caffeine daily     Vitals:   02/11/16 0903  BP: 118/80  Pulse: (!) 55  SpO2: 98%  Weight: 150 lb (68 kg)  Height: 6' 1"  (1.854 m)    PHYSICAL EXAM General: NAD HEENT: Normal. Neck: No JVD, no thyromegaly. Lungs: No rales or wheezes. CV: Nondisplaced PMI. Irregular rhythm, normal rate, normal S1/S2, no S3, no murmur. No pretibial or periankle edema.  Abdomen: Soft, nontender, no distention.  Neurologic: Alert and oriented x 3.  Psych: Normal affect. Musculoskeletal: No gross deformities. Extremities: No clubbing or cyanosis.     ECG: Most recent ECG reviewed.      ASSESSMENT AND PLAN: 1. CAD: Stable. Cath results noted above.  Continue metoprolol and nitrates.  2. Atrial fibrillation: Normal device function. Continue metoprolol and Xarelto. Patient prefers rate control strategy.  3. Essential HTN: Controlled on current therapy. No changes.  4. Pacemaker: Normal device function. Follows with Dr. Rayann Heman.  Dispo: fu 1 year.   Kate Sable, M.D., F.A.C.C.

## 2016-02-11 NOTE — Patient Instructions (Signed)
Medication Instructions:   Xarelto samples provided today.  Continue all other medications.    Labwork: none  Testing/Procedures: none  Follow-Up: Your physician wants you to follow up in:  1 year.  You will receive a reminder letter in the mail one-two months in advance.  If you don't receive a letter, please call our office to schedule the follow up appointment   Any Other Special Instructions Will Be Listed Below (If Applicable).  If you need a refill on your cardiac medications before your next appointment, please call your pharmacy.

## 2016-02-18 DIAGNOSIS — H40053 Ocular hypertension, bilateral: Secondary | ICD-10-CM | POA: Diagnosis not present

## 2016-02-24 ENCOUNTER — Ambulatory Visit (INDEPENDENT_AMBULATORY_CARE_PROVIDER_SITE_OTHER): Payer: Medicare Other | Admitting: *Deleted

## 2016-02-24 DIAGNOSIS — I495 Sick sinus syndrome: Secondary | ICD-10-CM | POA: Diagnosis not present

## 2016-02-24 NOTE — Progress Notes (Signed)
Remote pacemaker transmission.   

## 2016-02-26 ENCOUNTER — Encounter: Payer: Self-pay | Admitting: Cardiology

## 2016-03-06 LAB — CUP PACEART REMOTE DEVICE CHECK
Battery Remaining Longevity: 113 mo
Brady Statistic AP VS Percent: 0 %
Brady Statistic AS VP Percent: 61 %
Brady Statistic AS VS Percent: 39 %
Date Time Interrogation Session: 20171016060014
Implantable Lead Implant Date: 20070924
Implantable Lead Location: 753860
Lead Channel Pacing Threshold Amplitude: 1 V
Lead Channel Pacing Threshold Pulse Width: 0.4 ms
Lead Channel Sensing Intrinsic Amplitude: 3.6 mV
Lead Channel Sensing Intrinsic Amplitude: 9.5 mV
Lead Channel Setting Pacing Amplitude: 2 V
Lead Channel Setting Pacing Amplitude: 2.5 V
Lead Channel Setting Pacing Pulse Width: 0.4 ms
MDC IDC LEAD IMPLANT DT: 20070924
MDC IDC LEAD LOCATION: 753859
MDC IDC MSMT BATTERY REMAINING PERCENTAGE: 95.5 %
MDC IDC MSMT BATTERY VOLTAGE: 3.01 V
MDC IDC MSMT LEADCHNL RA IMPEDANCE VALUE: 450 Ohm
MDC IDC MSMT LEADCHNL RV IMPEDANCE VALUE: 400 Ohm
MDC IDC SET LEADCHNL RV SENSING SENSITIVITY: 2 mV
MDC IDC STAT BRADY AP VP PERCENT: 0 %
MDC IDC STAT BRADY RA PERCENT PACED: 0 %
MDC IDC STAT BRADY RV PERCENT PACED: 16 %
Pulse Gen Model: 2240
Pulse Gen Serial Number: 7826824

## 2016-03-09 DIAGNOSIS — I10 Essential (primary) hypertension: Secondary | ICD-10-CM | POA: Diagnosis not present

## 2016-03-09 DIAGNOSIS — Z125 Encounter for screening for malignant neoplasm of prostate: Secondary | ICD-10-CM | POA: Diagnosis not present

## 2016-03-09 DIAGNOSIS — E784 Other hyperlipidemia: Secondary | ICD-10-CM | POA: Diagnosis not present

## 2016-03-09 DIAGNOSIS — E038 Other specified hypothyroidism: Secondary | ICD-10-CM | POA: Diagnosis not present

## 2016-03-13 ENCOUNTER — Encounter: Payer: Self-pay | Admitting: Cardiology

## 2016-03-17 DIAGNOSIS — L409 Psoriasis, unspecified: Secondary | ICD-10-CM | POA: Diagnosis not present

## 2016-03-17 DIAGNOSIS — I480 Paroxysmal atrial fibrillation: Secondary | ICD-10-CM | POA: Diagnosis not present

## 2016-03-17 DIAGNOSIS — Z1389 Encounter for screening for other disorder: Secondary | ICD-10-CM | POA: Diagnosis not present

## 2016-03-17 DIAGNOSIS — I2510 Atherosclerotic heart disease of native coronary artery without angina pectoris: Secondary | ICD-10-CM | POA: Diagnosis not present

## 2016-03-17 DIAGNOSIS — Z23 Encounter for immunization: Secondary | ICD-10-CM | POA: Diagnosis not present

## 2016-03-17 DIAGNOSIS — R634 Abnormal weight loss: Secondary | ICD-10-CM | POA: Diagnosis not present

## 2016-03-17 DIAGNOSIS — R911 Solitary pulmonary nodule: Secondary | ICD-10-CM | POA: Diagnosis not present

## 2016-03-17 DIAGNOSIS — Z6820 Body mass index (BMI) 20.0-20.9, adult: Secondary | ICD-10-CM | POA: Diagnosis not present

## 2016-03-17 DIAGNOSIS — Z0000 Encounter for general adult medical examination without abnormal findings: Secondary | ICD-10-CM | POA: Diagnosis not present

## 2016-03-17 DIAGNOSIS — R413 Other amnesia: Secondary | ICD-10-CM | POA: Diagnosis not present

## 2016-03-17 DIAGNOSIS — K529 Noninfective gastroenteritis and colitis, unspecified: Secondary | ICD-10-CM | POA: Diagnosis not present

## 2016-03-17 DIAGNOSIS — J3800 Paralysis of vocal cords and larynx, unspecified: Secondary | ICD-10-CM | POA: Diagnosis not present

## 2016-03-27 DIAGNOSIS — L409 Psoriasis, unspecified: Secondary | ICD-10-CM | POA: Diagnosis not present

## 2016-03-27 DIAGNOSIS — K5180 Other ulcerative colitis without complications: Secondary | ICD-10-CM | POA: Diagnosis not present

## 2016-03-27 DIAGNOSIS — Z79899 Other long term (current) drug therapy: Secondary | ICD-10-CM | POA: Diagnosis not present

## 2016-04-15 ENCOUNTER — Encounter (INDEPENDENT_AMBULATORY_CARE_PROVIDER_SITE_OTHER): Payer: Self-pay | Admitting: Internal Medicine

## 2016-04-21 DIAGNOSIS — Z79899 Other long term (current) drug therapy: Secondary | ICD-10-CM | POA: Diagnosis not present

## 2016-04-21 DIAGNOSIS — Z85828 Personal history of other malignant neoplasm of skin: Secondary | ICD-10-CM | POA: Diagnosis not present

## 2016-04-21 DIAGNOSIS — L570 Actinic keratosis: Secondary | ICD-10-CM | POA: Diagnosis not present

## 2016-04-22 ENCOUNTER — Emergency Department (HOSPITAL_COMMUNITY)
Admission: EM | Admit: 2016-04-22 | Discharge: 2016-04-22 | Disposition: A | Discharge status: Home / Self Care | Payer: Medicare Other | Attending: Emergency Medicine | Admitting: Emergency Medicine

## 2016-04-22 ENCOUNTER — Encounter (HOSPITAL_COMMUNITY): Payer: Self-pay | Admitting: *Deleted

## 2016-04-22 ENCOUNTER — Emergency Department (HOSPITAL_COMMUNITY): Payer: Medicare Other

## 2016-04-22 DIAGNOSIS — M7989 Other specified soft tissue disorders: Secondary | ICD-10-CM | POA: Diagnosis not present

## 2016-04-22 DIAGNOSIS — S0181XA Laceration without foreign body of other part of head, initial encounter: Secondary | ICD-10-CM | POA: Diagnosis not present

## 2016-04-22 DIAGNOSIS — M25531 Pain in right wrist: Secondary | ICD-10-CM | POA: Diagnosis not present

## 2016-04-22 DIAGNOSIS — T148XXA Other injury of unspecified body region, initial encounter: Secondary | ICD-10-CM | POA: Diagnosis not present

## 2016-04-22 DIAGNOSIS — Y9301 Activity, walking, marching and hiking: Secondary | ICD-10-CM | POA: Diagnosis not present

## 2016-04-22 DIAGNOSIS — Y929 Unspecified place or not applicable: Secondary | ICD-10-CM | POA: Diagnosis not present

## 2016-04-22 DIAGNOSIS — Z950 Presence of cardiac pacemaker: Secondary | ICD-10-CM | POA: Insufficient documentation

## 2016-04-22 DIAGNOSIS — I10 Essential (primary) hypertension: Secondary | ICD-10-CM | POA: Diagnosis not present

## 2016-04-22 DIAGNOSIS — W108XXA Fall (on) (from) other stairs and steps, initial encounter: Secondary | ICD-10-CM | POA: Insufficient documentation

## 2016-04-22 DIAGNOSIS — I2510 Atherosclerotic heart disease of native coronary artery without angina pectoris: Secondary | ICD-10-CM | POA: Insufficient documentation

## 2016-04-22 DIAGNOSIS — S62111A Displaced fracture of triquetrum [cuneiform] bone, right wrist, initial encounter for closed fracture: Secondary | ICD-10-CM | POA: Diagnosis not present

## 2016-04-22 DIAGNOSIS — R42 Dizziness and giddiness: Secondary | ICD-10-CM | POA: Diagnosis not present

## 2016-04-22 DIAGNOSIS — I25118 Atherosclerotic heart disease of native coronary artery with other forms of angina pectoris: Secondary | ICD-10-CM | POA: Insufficient documentation

## 2016-04-22 DIAGNOSIS — S6991XA Unspecified injury of right wrist, hand and finger(s), initial encounter: Secondary | ICD-10-CM | POA: Diagnosis present

## 2016-04-22 DIAGNOSIS — Y999 Unspecified external cause status: Secondary | ICD-10-CM | POA: Insufficient documentation

## 2016-04-22 DIAGNOSIS — J449 Chronic obstructive pulmonary disease, unspecified: Secondary | ICD-10-CM | POA: Insufficient documentation

## 2016-04-22 DIAGNOSIS — S0990XA Unspecified injury of head, initial encounter: Secondary | ICD-10-CM | POA: Diagnosis not present

## 2016-04-22 DIAGNOSIS — W19XXXA Unspecified fall, initial encounter: Secondary | ICD-10-CM

## 2016-04-22 DIAGNOSIS — K1370 Unspecified lesions of oral mucosa: Secondary | ICD-10-CM | POA: Diagnosis not present

## 2016-04-22 DIAGNOSIS — Z87891 Personal history of nicotine dependence: Secondary | ICD-10-CM | POA: Diagnosis not present

## 2016-04-22 DIAGNOSIS — E039 Hypothyroidism, unspecified: Secondary | ICD-10-CM | POA: Diagnosis not present

## 2016-04-22 DIAGNOSIS — M25532 Pain in left wrist: Secondary | ICD-10-CM | POA: Diagnosis not present

## 2016-04-22 MED ORDER — HYDROCODONE-ACETAMINOPHEN 5-325 MG PO TABS: 1.0000 | ORAL_TABLET | Freq: Four times a day (QID) | ORAL | 0 refills | Status: DC | PRN

## 2016-04-22 MED ORDER — TETANUS-DIPHTH-ACELL PERTUSSIS 5-2.5-18.5 LF-MCG/0.5 IM SUSP
0.5000 mL | Freq: Once | INTRAMUSCULAR | Status: AC
  Administered 2016-04-22: 0.5 mL via INTRAMUSCULAR
  Filled 2016-04-22: qty 0.5

## 2016-04-22 MED ORDER — LIDOCAINE-EPINEPHRINE (PF) 2 %-1:200000 IJ SOLN
INTRAMUSCULAR | Status: AC
  Administered 2016-04-22: 20 mL
  Filled 2016-04-22: qty 20

## 2016-04-22 NOTE — Discharge Instructions (Signed)
You were seen today following a fall. He had a laceration of the face. You are at increased risk of infection given delay and repair. Monitor for signs and symptoms of infection. Have sutures removed in 5 days. He needs follow-up with orthopedics as well for a possible right wrist fracture. Maintain splint until that time.

## 2016-04-22 NOTE — ED Triage Notes (Signed)
Pt states he has been falling and he fell down 8-10 steps yesterday hitting his chin on some rocks; pt has laceration to underside of chin

## 2016-04-22 NOTE — ED Provider Notes (Signed)
[facility] DEPT Provider Note   CSN: [id number] Arrival date & time: 04/22/16  0434     History   Chief Complaint Chief Complaint  Patient presents with  . Fall    HPI [name] is a 81 y.o. male.  HPI  This is a 81 year old male with a history of coronary artery disease, COPD, dementia, atrial fibrillation on Xarelto who presents with generalized rash in. Patient reports mechanical fall greater than 24 hours ago. He is unclear of when exactly he fell but states that he was walking with a group and thinks he tripped on some steps. He fell on bilateral outstretched hands and hit his chin on a step. He has had progressively worsening bleeding from a laceration underneath his chin. He states "now will not stop." He also reports pain in the bilateral wrists. Denies chest pain, shortness of breath, abdominal pain. Denies syncope. He is unclear whether he lost consciousness. Denies neck pain.  Past Medical History:  Diagnosis Date  . Anxiety   . Arthritis   . CAD (coronary artery disease)    a.  cath 5/12: mLAD 50% then tandem 50%, dLAD 30%; D1 30%; OM1 occluded with collateral flow; pRCA 25%; EF 55% with inf HK;  occluded OM likely cause of perfusion defect on Myoview  . Colitis   . COPD (chronic obstructive pulmonary disease) ([place])   . Dementia    short term  . Depression   . Dizziness   . Dyslipidemia   . GERD (gastroesophageal reflux disease)   . Gout   . Hearing disorder   . Hyperlipidemia   . Hypertension   . Hypothyroidism   . Nonspecific abnormal unspecified cardiovascular function study   . Pacemaker   . Pelvic fracture ([place])   . Persistent atrial fibrillation ([place])   . Skin disorder    blister formation  . Tachy-brady syndrome ([place])    s/p pacer  . Ulcerative colitis     Patient Active Problem List   Diagnosis Date Noted  . COPD mixed type ([place]) 08/07/2014  . H/O asbestos exposure 08/07/2014  . Chest pain 07/06/2014  . Coronary artery disease  involving native coronary artery of native heart with other form of angina pectoris ([place])   . Anxiety 01/19/2012  . Skin rash 06/08/2011  . Fatigue 12/02/2010  . Coronary atherosclerosis of native coronary artery 10/01/2010  . ARTHRITIS, RIGHT KNEE 09/30/2010  . Radicular leg pain 09/30/2010  . Abnormal cardiovascular function study 09/09/2010  . OTHER MONONEURITIS OF LOWER LIMB 07/16/2010  . UNSPECIFIED MONONEURITIS OF LOWER LIMB 06/05/2010  . ARTHRITIS, RIGHT KNEE 06/05/2010  . LEG PAIN 06/05/2010  . Hyperlipidemia 11/19/2008  . HTN (hypertension) 11/19/2008  . Atrial fibrillation ([place]) 11/19/2008  . BRADYCARDIA-TACHYCARDIA SYNDROME 11/19/2008  . ULCERATIVE COLITIS 11/19/2008  . DIZZINESS 11/19/2008  . PPM-Medtronic 11/19/2008    Past Surgical History:  Procedure Laterality Date  . BACK SURGERY  1975  . CARDIAC CATHETERIZATION  09/20/10   EF 55%.....Dr. Percival Spanish  . CATARACT EXTRACTION Right   . CATARACT EXTRACTION W/PHACO Left 12/17/2014   Procedure: CATARACT EXTRACTION PHACO AND INTRAOCULAR LENS PLACEMENT (IOC);  Surgeon: Tonny Branch, Frank;  Location: [facility];  Service: Ophthalmology;  Laterality: Left;  CDE 7.44  . EP IMPLANTABLE DEVICE N/A 04/02/2015   Procedure:  PPM Generator Changeout;  Surgeon: Thompson Grayer, Frank;  Location: [facility] LAB;  Service: Cardiovascular;  Laterality: N/A;  . Implantation of a Medtronic dual chamber pacemaker  02/01/06   [name]  [name]  . INSERT / REPLACE / REMOVE PACEMAKER     MEDTRONIC....PPM Serial Number:  [id number].....Marland KitchenPPM Model Number:  [id number]....Marland KitchenPPM DOI:  02/01/2006    . KNEE SURGERY  2005   replacement  . LEFT HEART CATHETERIZATION WITH CORONARY ANGIOGRAM N/A 07/06/2014   Procedure: LEFT HEART CATHETERIZATION WITH CORONARY ANGIOGRAM;  Surgeon: Sinclair Grooms, Frank;  Location: [facility] CATH LAB;  Service: Cardiovascular;  Laterality: N/A;  . Left knee arthroscopy with debridement  04/23/2006   [name] M.D  . Left knee total knee   replacement  05/13/07   [name] M.D.  . LUMBAR SPINE SURGERY         Home Medications    Prior to Admission medications   Medication Sig Start Date End Date Taking? Authorizing Provider  acetaminophen (TYLENOL) 500 MG tablet Take 500 mg by mouth every 6 (six) hours as needed for mild pain (takes as needed for arthritis pain).    Historical Provider, Frank  ANORO ELLIPTA 62.5-25 MCG/INH AEPB INHALE ONE PUFF INTO THE LUNGS DAILY. 11/25/15   Deneise Lever, Frank  camphor-menthol [facility]) lotion Apply 1 application topically as needed for itching.    Historical Provider, Frank  Cholecalciferol (VITAMIN D) 2000 UNITS CAPS Take 1 capsule by mouth daily with supper. Patient takes 5000 units.    Historical Provider, Frank  docusate sodium (COLACE) 100 MG capsule Take 100 mg by mouth daily as needed for mild constipation.    Historical Provider, Frank  HYDROcodone-acetaminophen (NORCO/VICODIN) 5-325 MG tablet Take 1 tablet by mouth every 6 (six) hours as needed. 04/22/16   Merryl Hacker, Frank  isosorbide mononitrate (IMDUR) 30 MG 24 hr tablet Take 1 tablet (30 mg total) by mouth 2 (two) times daily. 05/09/15   Herminio Commons, Frank  levothyroxine (SYNTHROID, LEVOTHROID) 50 MCG tablet Take 50 mcg by mouth daily before breakfast.  09/11/13   Historical Provider, Frank  loratadine (CLARITIN) 10 MG tablet Take 10 mg by mouth at bedtime.    Historical Provider, Frank  methotrexate (50 MG/ML) 1 g injection Inject 0.5 mg into the vein once a week.    Historical Provider, Frank  metoprolol (LOPRESSOR) 50 MG tablet Take 50 mg by mouth 2 (two) times daily.    Historical Provider, Frank  nitroGLYCERIN (NITROSTAT) 0.4 MG SL tablet Place 1 tablet (0.4 mg total) under the tongue every 5 (five) minutes as needed for chest pain. 06/19/14   Herminio Commons, Frank  rivaroxaban (XARELTO) 20 MG TABS tablet Take 1 tablet (20 mg total) by mouth daily with supper. 01/22/16   Herminio Commons, Frank  traZODone (DESYREL) 50 MG tablet Take 1  tablet by mouth at bedtime. 04/18/15   Historical Provider, Frank  triamcinolone cream (KENALOG) 0.1 % Apply 1 application topically as directed. 08/17/15   Historical Provider, Frank    Family History Family History  Problem Relation Age of Onset  . Lung disease Father     Emphysema  . Hypertension Father   . Peptic Ulcer Disease Father   . COPD Father   . Hypertension Mother   . Liver cancer Mother   . Pernicious anemia Mother   . Depression Mother   . Kidney disease Mother   . Colitis Mother   . Asthma Sister   . Healthy Daughter   . Healthy Daughter   . Healthy Son   . COPD Sister   . Glaucoma Sister   . Hypertension Sister   .  Cancer Sister     Social History Social History  Substance Use Topics  . Smoking status: Former Smoker    Packs/day: 1.00    Years: 15.00    Types: Cigarettes    Start date: 05/13/1955    Quit date: 05/12/1983  . Smokeless tobacco: Never Used  . Alcohol use No     Allergies   Nsaids   Review of Systems Review of Systems  Respiratory: Negative for shortness of breath.   Cardiovascular: Negative for chest pain.  Gastrointestinal: Negative for abdominal pain.  Musculoskeletal:       Wrist pain  Skin: Positive for wound.  Neurological: Negative for dizziness and syncope.  All other systems reviewed and are negative.    Physical Exam Updated Vital Signs BP (!) 166/110 (BP Location: Left Arm)   Pulse 104   Temp 97.8 F (36.6 C) (Oral)   Resp 20   Ht 6' 2.5" (1.892 m)   Wt 145 lb (65.8 kg)   SpO2 95%   BMI 18.37 kg/m   Physical Exam  Constitutional: He is oriented to person, place, and time. No distress.  Elderly, no acute distress  HENT:  Head: Normocephalic.  Mouth/Throat: Oropharynx is clear and moist.  5 cm V-shaped laceration with brisk venous bleeding on the chin, underlying hematoma and bruising noted  Eyes: Pupils are equal, round, and reactive to light.  Neck: Normal range of motion. Neck supple.  No midline C-spine  tenderness to palpation, step-off, or deformity  Cardiovascular: Normal rate and normal heart sounds.   No murmur heard. Irregular rhythm  Pulmonary/Chest: Effort normal and breath sounds normal. No respiratory distress. He has no wheezes. He exhibits no tenderness.  Abdominal: Soft. Bowel sounds are normal. There is no tenderness. There is no rebound.  Musculoskeletal: He exhibits no edema.  Swelling of the bilateral wrists, normal range of motion, nose obvious deformities, 2+ radial pulses bilaterally  Neurological: He is alert and oriented to person, place, and time.  Skin: Skin is warm and dry.  Multiple bruising bilateral forearm  Psychiatric: He has a normal mood and affect.  Nursing note and vitals reviewed.    ED Treatments / Results  Labs (all labs ordered are listed, but only abnormal results are displayed) Labs Reviewed - No data to display  EKG  EKG Interpretation None       Radiology Dg Wrist Complete Left  Result Date: 04/22/2016 CLINICAL DATA:  81 year old male with fall and left wrist pain. EXAM: LEFT WRIST - COMPLETE 3+ VIEW COMPARISON:  Right wrist radiograph dated 04/22/2016 FINDINGS: No definite acute fracture identified. Evaluation for fractures is however limited due to advanced osteopenia. There is a sclerotic changes and atrophy of the proximal portion of the scaphoid possibly related to old fracture. Faint linear lucency through the distal portion of the scaphoid likely related to chronic changes and less likely an acute fracture. Correlation with clinical exam and point tenderness recommended. There is widening of the scapholunate distance measuring up to 7 mm. There is no dislocation. There is chondrocalcinosis of the TFCC. Mild soft tissue swelling of the dorsum of the hand. IMPRESSION: No definite acute fracture.  No dislocation. Widening of the scapholunate distance as well as sclerotic changes and atrophy of the proximal scaphoid likely related to prior  trauma and representing scapholunate dissociation. Electronically Signed   By: Anner Crete M.D.   On: 04/22/2016 05:53   Dg Wrist Complete Right  Result Date: 04/22/2016 CLINICAL DATA:  81 year old male  with fall and right wrist pain. EXAM: RIGHT WRIST - COMPLETE 3+ VIEW COMPARISON:  Left wrist radiograph dated 04/22/2016 FINDINGS: There is a 5 mm bone fragment along the posterior carpus likely representing a triquetrum fracture. No other acute fracture identified. The bones are osteopenic. There is no dislocation. There is chondrocalcinosis of the TFCC. There is soft tissue swelling of the dorsum of the wrist. No radiopaque foreign object identified. Vascular calcification noted. IMPRESSION: Findings concerning for triquetral fracture. Clinical correlation is recommended. Electronically Signed   By: Anner Crete M.D.   On: 04/22/2016 05:41   Ct Head Wo Contrast  Result Date: 04/22/2016 CLINICAL DATA:  81 y/o M; status post fall down steps with laceration to the chin. EXAM: CT HEAD WITHOUT CONTRAST TECHNIQUE: Contiguous axial images were obtained from the base of the skull through the vertex without intravenous contrast. COMPARISON:  07/22/2012 CT of the head. FINDINGS: Brain: No evidence of acute infarction, hemorrhage, hydrocephalus, extra-axial collection or mass lesion/mass effect.Stable extensive chronic microvascular ischemic changes of the brain and moderate brain parenchymal volume loss. Vascular: No hyperdense vessel. Calcific atherosclerosis of cavernous internal carotid arteries and vertebral arteries. Skull: Normal. Negative for fracture or focal lesion. Sinuses/Orbits: No acute finding. Bilateral intra-ocular lens replacement. Other: High-riding right jugular bulb with thin sigmoid plate. IMPRESSION: 1. No acute intracranial abnormality or displaced calvarial fracture identified. 2. Stable extensive chronic microvascular ischemic changes and moderate brain parenchymal volume loss.  Electronically Signed   By: Kristine Garbe M.D.   On: 04/22/2016 05:53    Procedures Procedures (including critical care time)  LACERATION REPAIR Performed by: Merryl Hacker Authorized by: Merryl Hacker Consent: Verbal consent obtained. Risks and benefits: risks, benefits and alternatives were discussed Consent given by: patient Patient identity confirmed: provided demographic data Prepped and Draped in normal sterile fashion Wound explored  Laceration Location: chin  Laceration Length: 5cm  No Foreign Bodies seen or palpated  Anesthesia: local infiltration  Local anesthetic: lidocaine 1% w epinephrine  Anesthetic total: 2 ml  Irrigation method: syringe Amount of cleaning: standard  Skin closure: 4-0 nylon  Number of sutures: 3  Technique: interrupted  Patient tolerance: Patient tolerated the procedure well with no immediate complications.   Medications Ordered in ED Medications  lidocaine-EPINEPHrine (XYLOCAINE W/EPI) 2 %-1:200000 (PF) injection (20 mLs  Given 04/22/16 0538)  Tdap (BOOSTRIX) injection 0.5 mL (0.5 mLs Intramuscular Given 04/22/16 0536)     Initial Impression / Assessment and Plan / ED Course  I have reviewed the triage vital signs and the nursing notes.  Pertinent labs & imaging results that were available during my care of the patient were reviewed by me and considered in my medical decision making (see chart for details).  Clinical Course     Patient presents following a fall. Reports a mechanical fall. This happened greater than 24 hours ago. He has a chin laceration that continues to have brisk bleeding. Denies syncope. Does report bilateral wrist pain as he fell on an outstretched hand. He is at risk for infection; however, given bleeding, feel his laceration needs to be repaired. 3 stitches were placed with good control of bleeding. CT head obtained given he has had on blood thinners. This is negative. He does have  evidence of possible right triquetral fracture. He was placed in a splint. He will need orthopedic follow-up. He was given precautions regarding signs and symptoms of infection. Follow-up with primary physician for suture removal in 5 days.  After history, exam, and  medical workup I feel the patient has been appropriately medically screened and is safe for discharge home. Pertinent diagnoses were discussed with the patient. Patient was given return precautions.   Final Clinical Impressions(s) / ED Diagnoses   Final diagnoses:  Fall, initial encounter  Facial laceration, initial encounter  Triquetral chip fracture, right, closed, initial encounter    New Prescriptions New Prescriptions   HYDROCODONE-ACETAMINOPHEN (NORCO/VICODIN) 5-325 MG TABLET    Take 1 tablet by mouth every 6 (six) hours as needed.     Merryl Hacker, Frank 04/22/16 [phone]

## 2016-04-22 NOTE — ED Notes (Signed)
ED Provider at bedside pt had three sutures applied by dr Dina Rich to control bleeding pt had dsg off steri strips non adherence pad and tape applied to pt chin

## 2016-04-23 DIAGNOSIS — M25532 Pain in left wrist: Secondary | ICD-10-CM | POA: Diagnosis not present

## 2016-04-23 DIAGNOSIS — M25531 Pain in right wrist: Secondary | ICD-10-CM | POA: Diagnosis not present

## 2016-04-24 ENCOUNTER — Telehealth: Payer: Self-pay | Admitting: Cardiovascular Disease

## 2016-04-24 NOTE — Telephone Encounter (Signed)
He can proceed with oral surgery. Xarelto can be held for 2 days prior to procedure and started as soon as feasible to reduce risk of stroke.

## 2016-04-24 NOTE — Telephone Encounter (Signed)
Asking about clearance for oral surgery

## 2016-04-24 NOTE — Telephone Encounter (Signed)
Noted.  Will fax this note back to the [facility]

## 2016-04-24 NOTE — Telephone Encounter (Signed)
Judson Roch called back again to stated that this matter is very URGENT.  He is scheduled for Procedure Tuesday and needs to instruct patient on what to do

## 2016-05-06 DIAGNOSIS — S62111S Displaced fracture of triquetrum [cuneiform] bone, right wrist, sequela: Secondary | ICD-10-CM | POA: Diagnosis not present

## 2016-05-06 DIAGNOSIS — M25531 Pain in right wrist: Secondary | ICD-10-CM | POA: Diagnosis not present

## 2016-05-24 ENCOUNTER — Emergency Department (HOSPITAL_COMMUNITY): Payer: Medicare Other

## 2016-05-24 ENCOUNTER — Encounter (HOSPITAL_COMMUNITY): Payer: Self-pay | Admitting: *Deleted

## 2016-05-24 ENCOUNTER — Emergency Department (HOSPITAL_COMMUNITY)
Admission: EM | Admit: 2016-05-24 | Discharge: 2016-05-24 | Disposition: A | Discharge status: Home / Self Care | Payer: Medicare Other | Attending: Emergency Medicine | Admitting: Emergency Medicine

## 2016-05-24 DIAGNOSIS — I10 Essential (primary) hypertension: Secondary | ICD-10-CM | POA: Insufficient documentation

## 2016-05-24 DIAGNOSIS — E039 Hypothyroidism, unspecified: Secondary | ICD-10-CM | POA: Diagnosis not present

## 2016-05-24 DIAGNOSIS — R001 Bradycardia, unspecified: Secondary | ICD-10-CM | POA: Diagnosis not present

## 2016-05-24 DIAGNOSIS — R404 Transient alteration of awareness: Secondary | ICD-10-CM | POA: Diagnosis not present

## 2016-05-24 DIAGNOSIS — R531 Weakness: Secondary | ICD-10-CM | POA: Diagnosis not present

## 2016-05-24 DIAGNOSIS — J449 Chronic obstructive pulmonary disease, unspecified: Secondary | ICD-10-CM | POA: Diagnosis not present

## 2016-05-24 DIAGNOSIS — I2510 Atherosclerotic heart disease of native coronary artery without angina pectoris: Secondary | ICD-10-CM | POA: Insufficient documentation

## 2016-05-24 DIAGNOSIS — R55 Syncope and collapse: Secondary | ICD-10-CM | POA: Diagnosis not present

## 2016-05-24 DIAGNOSIS — Z79899 Other long term (current) drug therapy: Secondary | ICD-10-CM | POA: Diagnosis not present

## 2016-05-24 DIAGNOSIS — Z87891 Personal history of nicotine dependence: Secondary | ICD-10-CM | POA: Insufficient documentation

## 2016-05-24 LAB — URINALYSIS, ROUTINE W REFLEX MICROSCOPIC
Bilirubin Urine: NEGATIVE
GLUCOSE, UA: NEGATIVE mg/dL
HGB URINE DIPSTICK: NEGATIVE
Ketones, ur: NEGATIVE mg/dL
Leukocytes, UA: NEGATIVE
Nitrite: NEGATIVE
Protein, ur: NEGATIVE mg/dL
Specific Gravity, Urine: 1.014 (ref 1.005–1.030)
pH: 7 (ref 5.0–8.0)

## 2016-05-24 LAB — CBC WITH DIFFERENTIAL/PLATELET
Basophils Absolute: 0 10*3/uL (ref 0.0–0.1)
Basophils Relative: 0 %
Eosinophils Absolute: 0.1 10*3/uL (ref 0.0–0.7)
Eosinophils Relative: 1 %
HCT: 37.1 % — ABNORMAL LOW (ref 39.0–52.0)
Hemoglobin: 12.5 g/dL — ABNORMAL LOW (ref 13.0–17.0)
Lymphocytes Relative: 12 %
Lymphs Abs: 1.1 10*3/uL (ref 0.7–4.0)
MCH: 31.7 pg (ref 26.0–34.0)
MCHC: 33.7 g/dL (ref 30.0–36.0)
MCV: 94.2 fL (ref 78.0–100.0)
Monocytes Absolute: 1.1 10*3/uL — ABNORMAL HIGH (ref 0.1–1.0)
Monocytes Relative: 12 %
Neutro Abs: 7 10*3/uL (ref 1.7–7.7)
Neutrophils Relative %: 75 %
Platelets: 167 10*3/uL (ref 150–400)
RBC: 3.94 MIL/uL — ABNORMAL LOW (ref 4.22–5.81)
RDW: 14.4 % (ref 11.5–15.5)
WBC: 9.4 10*3/uL (ref 4.0–10.5)

## 2016-05-24 LAB — BASIC METABOLIC PANEL
Anion gap: 6 (ref 5–15)
BUN: 20 mg/dL (ref 6–20)
CHLORIDE: 103 mmol/L (ref 101–111)
CO2: 26 mmol/L (ref 22–32)
Calcium: 8.9 mg/dL (ref 8.9–10.3)
Creatinine, Ser: 1.22 mg/dL (ref 0.61–1.24)
GFR calc Af Amer: >60 mL/min (ref >60)
GFR calc non Af Amer: 54 mL/min — ABNORMAL LOW (ref >60)
GLUCOSE: 97 mg/dL (ref 65–99)
POTASSIUM: 4.7 mmol/L (ref 3.5–5.1)
Sodium: 135 mmol/L (ref 135–145)

## 2016-05-24 LAB — TROPONIN I: Troponin I: <0.03 ng/mL (ref <0.03)

## 2016-05-24 MED ORDER — SODIUM CHLORIDE 0.9 % IV BOLUS (SEPSIS)
500.0000 mL | Freq: Once | INTRAVENOUS | Status: AC
  Administered 2016-05-24: 500 mL via INTRAVENOUS

## 2016-05-24 NOTE — ED Notes (Signed)
Charting error concerning rectal exam(wrong pt)

## 2016-05-24 NOTE — ED Triage Notes (Signed)
Pt was at church standing singing and had sudden fatigue and felt like as if he was going to pass out.  Denies any sob or pain other than chronic back pain.

## 2016-05-24 NOTE — ED Notes (Signed)
Called cell and home phone numbers with no answer, left message

## 2016-05-24 NOTE — ED Notes (Signed)
Pacemaker interrogated with [place] interrogator. Awaiting return call and fax.

## 2016-05-24 NOTE — ED Provider Notes (Signed)
[facility] DEPT Provider Note   CSN: [id number] Arrival date & time: 05/24/16  1240     History   Chief Complaint Chief Complaint  Patient presents with  . Near Syncope    HPI [name] is a 82 y.o. male.  The history is provided by the patient and the EMS personnel. The history is limited by the condition of the patient (Hx dementia).  Near Syncope   Pt was seen at 1240. Per EMS report and pt's wife: Pt was at church and told his wife he suddenly "didn't feel right," "fatigued," and "like he was going to pass out."  Pt's wife states in took 6 men to help him out of church because he "just crumpled up." Pt's wife does not believe pt lost consciousness. Pt does not recall events, and does not know why he is in the ED today. Denies any symptoms currently. Denies CP/palpitations, no SOB/cough, no abd pain, no N/V/D, no focal motor weakness, no tingling/numbness in extremities.   Past Medical History:  Diagnosis Date  . Anxiety   . Arthritis   . CAD (coronary artery disease)    a.  cath 5/12: mLAD 50% then tandem 50%, dLAD 30%; D1 30%; OM1 occluded with collateral flow; pRCA 25%; EF 55% with inf HK;  occluded OM likely cause of perfusion defect on Myoview  . Colitis   . COPD (chronic obstructive pulmonary disease) ([place])   . Dementia    short term  . Depression   . Dizziness   . Dyslipidemia   . GERD (gastroesophageal reflux disease)   . Gout   . Hearing disorder   . Hyperlipidemia   . Hypertension   . Hypothyroidism   . Nonspecific abnormal unspecified cardiovascular function study   . Pacemaker   . Pelvic fracture ([place])   . Persistent atrial fibrillation ([place])   . Skin disorder    blister formation  . Tachy-brady syndrome ([place])    s/p pacer  . Ulcerative colitis     Patient Active Problem List   Diagnosis Date Noted  . COPD mixed type ([place]) 08/07/2014  . H/O asbestos exposure 08/07/2014  . Chest pain 07/06/2014  . Coronary artery disease involving  native coronary artery of native heart with other form of angina pectoris ([place])   . Anxiety 01/19/2012  . Skin rash 06/08/2011  . Fatigue 12/02/2010  . Coronary atherosclerosis of native coronary artery 10/01/2010  . ARTHRITIS, RIGHT KNEE 09/30/2010  . Radicular leg pain 09/30/2010  . Abnormal cardiovascular function study 09/09/2010  . OTHER MONONEURITIS OF LOWER LIMB 07/16/2010  . UNSPECIFIED MONONEURITIS OF LOWER LIMB 06/05/2010  . ARTHRITIS, RIGHT KNEE 06/05/2010  . LEG PAIN 06/05/2010  . Hyperlipidemia 11/19/2008  . HTN (hypertension) 11/19/2008  . Atrial fibrillation ([place]) 11/19/2008  . BRADYCARDIA-TACHYCARDIA SYNDROME 11/19/2008  . ULCERATIVE COLITIS 11/19/2008  . DIZZINESS 11/19/2008  . PPM-Medtronic 11/19/2008    Past Surgical History:  Procedure Laterality Date  . BACK SURGERY  1975  . CARDIAC CATHETERIZATION  09/20/10   EF 55%.....Dr. Percival Spanish  . CATARACT EXTRACTION Right   . CATARACT EXTRACTION W/PHACO Left 12/17/2014   Procedure: CATARACT EXTRACTION PHACO AND INTRAOCULAR LENS PLACEMENT (IOC);  Surgeon: Tonny Branch, MD;  Location: [facility];  Service: Ophthalmology;  Laterality: Left;  CDE 7.44  . EP IMPLANTABLE DEVICE N/A 04/02/2015   Procedure:  PPM Generator Changeout;  Surgeon: Thompson Grayer, MD;  Location: [facility] LAB;  Service: Cardiovascular;  Laterality: N/A;  . Implantation of a  Medtronic dual chamber pacemaker  02/01/06   [name] [name] MD  . Randolm Idol / REPLACE / REMOVE PACEMAKER     MEDTRONIC....PPM Serial Number:  [id number].....Marland KitchenPPM Model Number:  [id number]....Marland KitchenPPM DOI:  02/01/2006    . KNEE SURGERY  2005   replacement  . LEFT HEART CATHETERIZATION WITH CORONARY ANGIOGRAM N/A 07/06/2014   Procedure: LEFT HEART CATHETERIZATION WITH CORONARY ANGIOGRAM;  Surgeon: Sinclair Grooms, MD;  Location: [facility] CATH LAB;  Service: Cardiovascular;  Laterality: N/A;  . Left knee arthroscopy with debridement  04/23/2006   [name] M.D  . Left knee total knee   replacement  05/13/07   [name] M.D.  . LUMBAR SPINE SURGERY         Home Medications    Prior to Admission medications   Medication Sig Start Date End Date Taking? Authorizing Provider  acetaminophen (TYLENOL) 500 MG tablet Take 500 mg by mouth every 6 (six) hours as needed for mild pain (takes as needed for arthritis pain).    Historical Provider, MD  ANORO ELLIPTA 62.5-25 MCG/INH AEPB INHALE ONE PUFF INTO THE LUNGS DAILY. 11/25/15   Deneise Lever, MD  camphor-menthol [facility]) lotion Apply 1 application topically as needed for itching.    Historical Provider, MD  Cholecalciferol (VITAMIN D) 2000 UNITS CAPS Take 1 capsule by mouth daily with supper. Patient takes 5000 units.    Historical Provider, MD  docusate sodium (COLACE) 100 MG capsule Take 100 mg by mouth daily as needed for mild constipation.    Historical Provider, MD  HYDROcodone-acetaminophen (NORCO/VICODIN) 5-325 MG tablet Take 1 tablet by mouth every 6 (six) hours as needed. 04/22/16   Merryl Hacker, MD  isosorbide mononitrate (IMDUR) 30 MG 24 hr tablet Take 1 tablet (30 mg total) by mouth 2 (two) times daily. 05/09/15   Herminio Commons, MD  levothyroxine (SYNTHROID, LEVOTHROID) 50 MCG tablet Take 50 mcg by mouth daily before breakfast.  09/11/13   Historical Provider, MD  loratadine (CLARITIN) 10 MG tablet Take 10 mg by mouth at bedtime.    Historical Provider, MD  methotrexate (50 MG/ML) 1 g injection Inject 0.5 mg into the vein once a week.    Historical Provider, MD  metoprolol (LOPRESSOR) 50 MG tablet Take 50 mg by mouth 2 (two) times daily.    Historical Provider, MD  nitroGLYCERIN (NITROSTAT) 0.4 MG SL tablet Place 1 tablet (0.4 mg total) under the tongue every 5 (five) minutes as needed for chest pain. 06/19/14   Herminio Commons, MD  rivaroxaban (XARELTO) 20 MG TABS tablet Take 1 tablet (20 mg total) by mouth daily with supper. 01/22/16   Herminio Commons, MD  traZODone (DESYREL) 50 MG tablet Take 1  tablet by mouth at bedtime. 04/18/15   Historical Provider, MD  triamcinolone cream (KENALOG) 0.1 % Apply 1 application topically as directed. 08/17/15   Historical Provider, MD    Family History Family History  Problem Relation Age of Onset  . Lung disease Father     Emphysema  . Hypertension Father   . Peptic Ulcer Disease Father   . COPD Father   . Hypertension Mother   . Liver cancer Mother   . Pernicious anemia Mother   . Depression Mother   . Kidney disease Mother   . Colitis Mother   . Asthma Sister   . Healthy Daughter   . Healthy Daughter   . Healthy Son   . COPD Sister   . Glaucoma  Sister   . Hypertension Sister   . Cancer Sister     Social History Social History  Substance Use Topics  . Smoking status: Former Smoker    Packs/day: 1.00    Years: 15.00    Types: Cigarettes    Start date: 05/13/1955    Quit date: 05/12/1983  . Smokeless tobacco: Never Used  . Alcohol use No     Allergies   Nsaids   Review of Systems Review of Systems  Unable to perform ROS: Dementia  Cardiovascular: Positive for near-syncope.     Physical Exam Updated Vital Signs BP 106/64 (BP Location: Right Arm)   Pulse 60   Temp 97.5 F (36.4 C) (Oral)   Resp 18   Ht 6' 2"  (1.88 m)   Wt 140 lb (63.5 kg)   SpO2 100%   BMI 17.97 kg/m   13:26 Orthostatic Vital Signs CS  Orthostatic Lying   BP- Lying: 103/67  Pulse- Lying: 60      Orthostatic Sitting  BP- Sitting: 98/68  Pulse- Sitting: 66      Orthostatic Standing at 0 minutes  BP- Standing at 0 minutes: 100/65  Pulse- Standing at 0 minutes: 70   Patient Vitals for the past 24 hrs:  BP Temp Temp src Pulse Resp SpO2 Height Weight  05/24/16 1507 - - - (!) 59 15 97 % - -  05/24/16 1506 128/81 - - (!) 35 15 (!) 87 % - -  05/24/16 1445 - - - 63 15 99 % - -  05/24/16 1430 - - - (!) 59 16 99 % - -  05/24/16 1345 - - - 60 13 97 % - -  05/24/16 1330 96/71 - - 68 19 93 % - -  05/24/16 1315 - - - 63 12 98 % - -  05/24/16  1300 103/67 - - 61 17 99 % - -  05/24/16 1248 106/64 97.5 F (36.4 C) Oral 60 18 100 % - -  05/24/16 1242 - - - - - - 6' 2"  (1.88 m) 140 lb (63.5 kg)      Physical Exam 1245: Physical examination:  Nursing notes reviewed; Vital signs and O2 SAT reviewed;  Constitutional: Well developed, Well nourished, Well hydrated, In no acute distress; Head:  Normocephalic, atraumatic; Eyes: EOMI, PERRL, No scleral icterus; ENMT: Mouth and pharynx normal, Mucous membranes moist; Neck: Supple, Full range of motion, No lymphadenopathy; Cardiovascular: Regular rate and rhythm, No gallop; Respiratory: Breath sounds clear & equal bilaterally, No wheezes.  Speaking full sentences with ease, Normal respiratory effort/excursion; Chest: Nontender, Movement normal; Abdomen: Soft, Nontender, Nondistended, Normal bowel sounds; Genitourinary: No CVA tenderness; Extremities: Pulses normal, No tenderness, No edema, No calf edema or asymmetry.; Neuro: Awake, alert, confused re: events, time. Major CN grossly intact. No facial droop. Speech clear. Grips equal. Strength 5/5 equal bilat UE's and LE's without apparent gross focal motor deficits in extremities. No gross sensory deficits.  Normal cerebellar testing bilat UE's (finger-nose) and LE's (heel-shin). ;  Skin: Color normal, Warm, Dry.   ED Treatments / Results  Labs (all labs ordered are listed, but only abnormal results are displayed)   EKG  EKG Interpretation  Date/Time:  Sunday May 24 2016 12:46:52 EST Ventricular Rate:  60 PR Interval:    QRS Duration: 145 QT Interval:  515 QTC Calculation: 515 R Axis:   -93 Text Interpretation:  Ventricular-paced rhythm No further analysis attempted due to paced rhythm When compared with ECG of 07/22/2012 No  significant change was found Confirmed by [facility]  MD, Nunzio Cory [id number]) on 05/24/2016 1:01:46 PM       Radiology   Procedures Procedures (including critical care time)  Medications Ordered in ED Medications  - No data to display   Initial Impression / Assessment and Plan / ED Course  I have reviewed the triage vital signs and the nursing notes.  Pertinent labs & imaging results that were available during my care of the patient were reviewed by me and considered in my medical decision making (see chart for details).  MDM Reviewed: previous chart, nursing note and vitals Reviewed previous: labs and ECG Interpretation: labs, ECG, x-ray and CT scan   Results for orders placed or performed during the hospital encounter of 05/24/16  CBC with Differential  Result Value Ref Range   WBC 9.4 4.0 - 10.5 K/uL   RBC 3.94 (L) 4.22 - 5.81 MIL/uL   Hemoglobin 12.5 (L) 13.0 - 17.0 g/dL   HCT 37.1 (L) 39.0 - 52.0 %   MCV 94.2 78.0 - 100.0 fL   MCH 31.7 26.0 - 34.0 pg   MCHC 33.7 30.0 - 36.0 g/dL   RDW 14.4 11.5 - 15.5 %   Platelets 167 150 - 400 K/uL   Neutrophils Relative % 75 %   Neutro Abs 7.0 1.7 - 7.7 K/uL   Lymphocytes Relative 12 %   Lymphs Abs 1.1 0.7 - 4.0 K/uL   Monocytes Relative 12 %   Monocytes Absolute 1.1 (H) 0.1 - 1.0 K/uL   Eosinophils Relative 1 %   Eosinophils Absolute 0.1 0.0 - 0.7 K/uL   Basophils Relative 0 %   Basophils Absolute 0.0 0.0 - 0.1 K/uL  Troponin I  Result Value Ref Range   Troponin I <0.03 <0.03 ng/mL  Basic metabolic panel  Result Value Ref Range   Sodium 135 135 - 145 mmol/L   Potassium 4.7 3.5 - 5.1 mmol/L   Chloride 103 101 - 111 mmol/L   CO2 26 22 - 32 mmol/L   Glucose, Bld 97 65 - 99 mg/dL   BUN 20 6 - 20 mg/dL   Creatinine, Ser 1.22 0.61 - 1.24 mg/dL   Calcium 8.9 8.9 - 10.3 mg/dL   GFR calc non Af Amer 54 (L) >60 mL/min   GFR calc Af Amer >60 >60 mL/min   Anion gap 6 5 - 15   Ct Head Wo Contrast Result Date: 05/24/2016 CLINICAL DATA:  Near syncope today. EXAM: CT HEAD WITHOUT CONTRAST TECHNIQUE: Contiguous axial images were obtained from the base of the skull through the vertex without intravenous contrast. COMPARISON:  04/22/2016. FINDINGS:  Brain: Diffusely enlarged ventricles and subarachnoid spaces. Patchy white matter low density in both cerebral hemispheres. Stable left thalamic lacunar infarct. No intracranial hemorrhage, mass lesion or CT evidence of acute infarction. Vascular: No hyperdense vessel or unexpected calcification. Skull: Normal. Negative for fracture or focal lesion. Sinuses/Orbits: Right maxillary sinus fluid. Two small right superior scleral calcifications. Otherwise, unremarkable orbits. Other: None. IMPRESSION: 1. No acute abnormality. 2. Moderate diffuse cerebral and cerebellar atrophy. 3. Extensive chronic small vessel white matter ischemic changes in both cerebral hemispheres. 4. Old left thalamic lacunar infarct. Electronically Signed   By: Claudie Revering M.D.   On: 05/24/2016 14:47   Dg Chest Portable 1 View Result Date: 05/24/2016 CLINICAL DATA:  Syncope. EXAM: PORTABLE CHEST 1 VIEW COMPARISON:  03/04/2015 FINDINGS: Name silhouette is normal in size. Normal mediastinal and hilar contours. Lungs are clear.  No convincing  pleural effusion.  No pneumothorax. Left anterior chest wall sequential pacemaker is stable. The skeletal structures are intact. IMPRESSION: No active disease. Electronically Signed   By: Lajean Manes M.D.   On: 05/24/2016 13:37    1535:  Pacemaker interrogated: reported as no problems or events in the past 24 hours. Judicious IVF bolus given for lower BP's than baseline with improvement. Pt's wife left and brought food and is now feeding pt. Pt has tol PO well without N/V. Pt has ambulated with steady gait, easy resps, NAD. Dx and testing d/w pt and family.  Questions answered.  Verb understanding. Wife concerned regarding taking pt home.  T/C to [facility] Dr. Lorin Mercy, case discussed, including:  HPI, pertinent PM/SHx, VS/PE, dx testing, ED course and treatment: states pt is low risk via syncope score, no clear indication for admission at this time, pt can be discharged to f/u as outpatient. Dx and testing,  and d/w [facility] MD, d/w pt and family.  Questions answered.  Verb understanding, agreeable to d/c home with outpt f/u.    Final Clinical Impressions(s) / ED Diagnoses   Final diagnoses:  None    New Prescriptions New Prescriptions   No medications on file      Francine Graven, DO 05/27/16 2376

## 2016-05-24 NOTE — ED Notes (Signed)
Returned from CT.

## 2016-05-24 NOTE — Discharge Instructions (Signed)
Take your usual prescriptions as previously directed.  Call your regular medical doctor and your Cardiologist tomorrow to schedule a follow up appointment within the next 2 days.  Return to the Emergency Department immediately sooner if worsening.

## 2016-05-24 NOTE — ED Notes (Signed)
Return call from [facility]. [phone]. No problems or adverse events in the past 24 hours. Awaiting fax report.

## 2016-05-25 ENCOUNTER — Ambulatory Visit (INDEPENDENT_AMBULATORY_CARE_PROVIDER_SITE_OTHER): Payer: Medicare Other | Admitting: *Deleted

## 2016-05-25 DIAGNOSIS — I495 Sick sinus syndrome: Secondary | ICD-10-CM | POA: Diagnosis not present

## 2016-05-25 LAB — CUP PACEART REMOTE DEVICE CHECK
Battery Remaining Longevity: 121 mo
Brady Statistic AP VP Percent: 0 %
Brady Statistic AS VP Percent: 61 %
Brady Statistic RA Percent Paced: 0 %
Brady Statistic RV Percent Paced: 17 %
Date Time Interrogation Session: 20180114180949
Implantable Lead Implant Date: 20070924
Implantable Lead Implant Date: 20070924
Implantable Lead Location: 753859
Implantable Lead Model: 5076
Lead Channel Impedance Value: 400 Ohm
Lead Channel Sensing Intrinsic Amplitude: 3.6 mV
Lead Channel Sensing Intrinsic Amplitude: 9.3 mV
Lead Channel Setting Pacing Amplitude: 2 V
Lead Channel Setting Pacing Amplitude: 2.5 V
Lead Channel Setting Sensing Sensitivity: 2 mV
MDC IDC LEAD LOCATION: 753860
MDC IDC MSMT BATTERY REMAINING PERCENTAGE: 95.5 %
MDC IDC MSMT BATTERY VOLTAGE: 3.01 V
MDC IDC MSMT LEADCHNL RA IMPEDANCE VALUE: 480 Ohm
MDC IDC MSMT LEADCHNL RV PACING THRESHOLD AMPLITUDE: 1 V
MDC IDC MSMT LEADCHNL RV PACING THRESHOLD PULSEWIDTH: 0.4 ms
MDC IDC PG IMPLANT DT: 20161122
MDC IDC PG SERIAL: 7826824
MDC IDC SET LEADCHNL RV PACING PULSEWIDTH: 0.4 ms
MDC IDC STAT BRADY AP VS PERCENT: 0 %
MDC IDC STAT BRADY AS VS PERCENT: 39 %
Pulse Gen Model: 2240

## 2016-05-25 NOTE — Progress Notes (Signed)
Remote pacemaker transmission.   

## 2016-06-03 ENCOUNTER — Encounter: Payer: Self-pay | Admitting: Cardiology

## 2016-06-04 ENCOUNTER — Ambulatory Visit (INDEPENDENT_AMBULATORY_CARE_PROVIDER_SITE_OTHER): Payer: Medicare Other | Admitting: Cardiovascular Disease

## 2016-06-04 ENCOUNTER — Encounter: Payer: Self-pay | Admitting: Cardiovascular Disease

## 2016-06-04 VITALS — BP 112/64 | HR 68 | Ht 74.0 in | Wt 140.0 lb

## 2016-06-04 DIAGNOSIS — Z9289 Personal history of other medical treatment: Secondary | ICD-10-CM

## 2016-06-04 DIAGNOSIS — I209 Angina pectoris, unspecified: Secondary | ICD-10-CM | POA: Diagnosis not present

## 2016-06-04 DIAGNOSIS — I25118 Atherosclerotic heart disease of native coronary artery with other forms of angina pectoris: Secondary | ICD-10-CM | POA: Diagnosis not present

## 2016-06-04 DIAGNOSIS — I482 Chronic atrial fibrillation, unspecified: Secondary | ICD-10-CM

## 2016-06-04 DIAGNOSIS — I10 Essential (primary) hypertension: Secondary | ICD-10-CM | POA: Diagnosis not present

## 2016-06-04 DIAGNOSIS — I495 Sick sinus syndrome: Secondary | ICD-10-CM

## 2016-06-04 DIAGNOSIS — R55 Syncope and collapse: Secondary | ICD-10-CM | POA: Diagnosis not present

## 2016-06-04 DIAGNOSIS — E7800 Pure hypercholesterolemia, unspecified: Secondary | ICD-10-CM

## 2016-06-04 DIAGNOSIS — Z950 Presence of cardiac pacemaker: Secondary | ICD-10-CM

## 2016-06-04 NOTE — Progress Notes (Signed)
SUBJECTIVE: The patient presents after being evaluated in the ED on 05/24/16 for a near syncopal episode. He did not have any chest pain, palpitations, or shortness of breath at that time. I reviewed all pertinent labs, studies, and documentation pertaining to this.  Head CT showed no acute abnormalities with moderate diffuse cerebral and cerebellar atrophy. There was extensive chronic small vessel white matter ischemic changes in both cerebral hemispheres and an old left thalamic lacunar infarct. Prograf chest x-ray showed no active disease.  Pacemaker was interrogated and showed no abnormalities.  ECG showed a paced ventricular rhythm.  Troponin and urinalysis were normal.  BUN 22, creatinine 1.22.  He felt better after IV fluids.  In summary, he has a history of atrial fibrillation, hypertension, hyperlipidemia, coronary artery disease, and has a pacemaker for tachycardia-bradycardia syndrome.  He also has COPD and hypothyroidism.  Lexiscan Cardiolite stress test in 10/2013 demonstrated a fixed inferolateral defect which was consistent with myocardial scar versus attenuation artifact.   He underwent coronary angiography on 07/06/14 which demonstrated moderate coronary disease with diffuse multifocal disease in the 50-60% range throughout the mid and distal LAD. There was noted to be slight progression since his prior study in 2012. There was total occlusion of the first obtuse marginal with left to left collaterals and nonobstructive disease in the RCA. Overall, the LAD had not significantly progressed. Left ventriculography showed normal left ventricular systolic function with mid to distal anterolateral wall motion abnormalities likely related to occlusion of the first marginal branch. LVEF 60%.   He currently denies chest pain, shortness of breath, leg swelling, and lightheadedness.  He and his wife are moving to [place] at [facility] in [place] to be closer to their  daughter, Katharine Look.  His wife who does the driving is not certain if they will come to the [place] office to see me. I did offer transferring his care to one of my colleagues in [place].   Soc: Married. Daughter [name]) moved to [place] from [place]. Daughter (Dr. Augustina Mood, dentist) lives in [place]. Son [name]) also lives in [place].   Review of Systems: As per "subjective", otherwise negative.  Allergies  Allergen Reactions  . Nsaids     Colitis, upset stomach    Current Outpatient Prescriptions  Medication Sig Dispense Refill  . acetaminophen (TYLENOL) 500 MG tablet Take 500 mg by mouth every 6 (six) hours as needed for mild pain (takes as needed for arthritis pain).    Jearl Klinefelter ELLIPTA 62.5-25 MCG/INH AEPB INHALE ONE PUFF INTO THE LUNGS DAILY. 60 each 5  . camphor-menthol (SARNA) lotion Apply 1 application topically as needed for itching.    . Cholecalciferol (VITAMIN D) 2000 UNITS CAPS Take 1 capsule by mouth daily with supper. Patient takes 5000 units.    Marland Kitchen docusate sodium (COLACE) 100 MG capsule Take 100 mg by mouth daily as needed for mild constipation.    Marland Kitchen HYDROcodone-acetaminophen (NORCO/VICODIN) 5-325 MG tablet Take 1 tablet by mouth every 6 (six) hours as needed. 6 tablet 0  . isosorbide mononitrate (IMDUR) 30 MG 24 hr tablet Take 1 tablet (30 mg total) by mouth 2 (two) times daily. 180 tablet 3  . levothyroxine (SYNTHROID, LEVOTHROID) 50 MCG tablet Take 50 mcg by mouth daily before breakfast.     . MAGNESIUM PO Take 3 tablets by mouth at bedtime.    . methotrexate (50 MG/ML) 1 g injection Inject 50 mg into the vein once a week. On Thursday.    Marland Kitchen  metoprolol (LOPRESSOR) 50 MG tablet Take 50 mg by mouth 2 (two) times daily.    . nitroGLYCERIN (NITROSTAT) 0.4 MG SL tablet Place 1 tablet (0.4 mg total) under the tongue every 5 (five) minutes as needed for chest pain. 25 tablet 3  . rivaroxaban (XARELTO) 20 MG TABS tablet Take 1 tablet (20 mg total) by mouth daily with supper.  28 tablet 0  . traZODone (DESYREL) 50 MG tablet Take 1 tablet by mouth at bedtime.    . triamcinolone cream (KENALOG) 0.1 % Apply 1 application topically as directed.  4   No current facility-administered medications for this visit.     Past Medical History:  Diagnosis Date  . Anxiety   . Arthritis   . CAD (coronary artery disease)    a.  cath 5/12: mLAD 50% then tandem 50%, dLAD 30%; D1 30%; OM1 occluded with collateral flow; pRCA 25%; EF 55% with inf HK;  occluded OM likely cause of perfusion defect on Myoview  . Colitis   . COPD (chronic obstructive pulmonary disease) ([place])   . Dementia    short term  . Depression   . Dizziness   . Dyslipidemia   . GERD (gastroesophageal reflux disease)   . Gout   . Hearing disorder   . Hyperlipidemia   . Hypertension   . Hypothyroidism   . Nonspecific abnormal unspecified cardiovascular function study   . Pacemaker   . Pelvic fracture ([place])   . Persistent atrial fibrillation ([place])   . Skin disorder    blister formation  . Tachy-brady syndrome ([place])    s/p pacer  . Ulcerative colitis     Past Surgical History:  Procedure Laterality Date  . BACK SURGERY  1975  . CARDIAC CATHETERIZATION  09/20/10   EF 55%.....Dr. Percival Spanish  . CATARACT EXTRACTION Right   . CATARACT EXTRACTION W/PHACO Left 12/17/2014   Procedure: CATARACT EXTRACTION PHACO AND INTRAOCULAR LENS PLACEMENT (IOC);  Surgeon: Tonny Branch, MD;  Location: [facility];  Service: Ophthalmology;  Laterality: Left;  CDE 7.44  . EP IMPLANTABLE DEVICE N/A 04/02/2015   Procedure:  PPM Generator Changeout;  Surgeon: Thompson Grayer, MD;  Location: [facility] CV LAB;  Service: Cardiovascular;  Laterality: N/A;  . Implantation of a Medtronic dual chamber pacemaker  02/01/06   [name] [name] MD  . Randolm Idol / REPLACE / REMOVE PACEMAKER     MEDTRONIC....PPM Serial Number:  [id number].....Marland KitchenPPM Model Number:  [id number]....Marland KitchenPPM DOI:  02/01/2006    . KNEE SURGERY  2005   replacement  . LEFT HEART  CATHETERIZATION WITH CORONARY ANGIOGRAM N/A 07/06/2014   Procedure: LEFT HEART CATHETERIZATION WITH CORONARY ANGIOGRAM;  Surgeon: Sinclair Grooms, MD;  Location: [facility] C[facility] LAB;  Service: Cardiovascular;  Laterality: N/A;  . Left knee arthroscopy with debridement  04/23/2006   [name] M.D  . Left knee total knee  replacement  05/13/07   [name] M.D.  . LUMBAR SPINE SURGERY      Social History   Social History  . Marital status: Married    Spouse name: [name]  . Number of children: 3  . Years of education: 12   Occupational History  . retired Retired   Social History Main Topics  . Smoking status: Former Smoker    Packs/day: 1.00    Years: 15.00    Types: Cigarettes    Start date: 05/13/1955    Quit date: 05/12/1983  . Smokeless tobacco: Never Used  . Alcohol use No  .  Drug use: No  . Sexual activity: Yes    Birth control/ protection: None   Other Topics Concern  . Not on file   Social History Narrative   One cup of caffeine daily     Vitals:   06/04/16 1011  BP: 112/64  Pulse: 68  SpO2: 93%  Weight: 140 lb (63.5 kg)  Height: 6' 2"  (1.88 m)    PHYSICAL EXAM General: NAD HEENT: Normal. Neck: No JVD, no thyromegaly. Lungs: Clear to auscultation bilaterally with normal respiratory effort. CV: Nondisplaced PMI.  Regular rate and rhythm, normal S1/S2, no S3/S4, no murmur. No pretibial or periankle edema.   Abdomen: Soft, nontender, no distention.  Neurologic: Alert and oriented.  Psych: Normal affect. Skin: Normal. Musculoskeletal: No gross deformities.    ECG: Most recent ECG reviewed.      ASSESSMENT AND PLAN: 1. CAD: Stable. Cath results noted above. Continue metoprolol and nitrates.  2. Atrial fibrillation: Normal device function this month. Continue metoprolol and Xarelto. Patient prefers rate control strategy.  3. Essential HTN: Controlled on current therapy. No changes.  4. Pacemaker: Normal device function. Follows with  Dr. Rayann Heman.  5. Near syncope: No recurrences. Unclear etiology but likely vasovagal and secondary to dehydration. His wife says he drinks 2 cups of coffee and hardly drinks any water. I encouraged him to stay adequately hydrated with 6-8 glasses of water daily.  Dispo: fu 6 months in [place] office. His wife who does the driving is not certain if they will come to the [place] office to see me. I did offer transferring his care to one of my colleagues in [place].   Kate Sable, M.D., F.A.C.C.

## 2016-06-04 NOTE — Patient Instructions (Signed)
Medication Instructions:  Continue all current medications.  Labwork: none  Testing/Procedures: none  Follow-Up: Your physician wants you to follow up in: 6 months.  You will receive a reminder letter in the mail one-two months in advance.  If you don't receive a letter, please call our office to schedule the follow up appointment   Any Other Special Instructions Will Be Listed Below (If Applicable).  If you need a refill on your cardiac medications before your next appointment, please call your pharmacy. s

## 2016-06-17 ENCOUNTER — Encounter: Payer: Self-pay | Admitting: Cardiology

## 2016-07-03 DIAGNOSIS — L409 Psoriasis, unspecified: Secondary | ICD-10-CM | POA: Diagnosis not present

## 2016-07-03 DIAGNOSIS — Z79899 Other long term (current) drug therapy: Secondary | ICD-10-CM | POA: Diagnosis not present

## 2016-07-03 DIAGNOSIS — Z681 Body mass index (BMI) 19 or less, adult: Secondary | ICD-10-CM | POA: Diagnosis not present

## 2016-07-03 DIAGNOSIS — K5180 Other ulcerative colitis without complications: Secondary | ICD-10-CM | POA: Diagnosis not present

## 2016-07-10 ENCOUNTER — Encounter: Payer: Medicare Other | Admitting: Internal Medicine

## 2016-07-17 ENCOUNTER — Other Ambulatory Visit: Payer: Self-pay | Admitting: Internal Medicine

## 2016-07-17 ENCOUNTER — Ambulatory Visit (INDEPENDENT_AMBULATORY_CARE_PROVIDER_SITE_OTHER): Payer: Medicare Other | Admitting: Internal Medicine

## 2016-07-17 VITALS — BP 109/72 | HR 64 | Ht 74.0 in | Wt 138.8 lb

## 2016-07-17 DIAGNOSIS — I10 Essential (primary) hypertension: Secondary | ICD-10-CM

## 2016-07-17 DIAGNOSIS — I482 Chronic atrial fibrillation, unspecified: Secondary | ICD-10-CM

## 2016-07-17 DIAGNOSIS — I495 Sick sinus syndrome: Secondary | ICD-10-CM | POA: Diagnosis not present

## 2016-07-17 DIAGNOSIS — I25118 Atherosclerotic heart disease of native coronary artery with other forms of angina pectoris: Secondary | ICD-10-CM | POA: Diagnosis not present

## 2016-07-17 NOTE — Progress Notes (Signed)
PCP: Tivis Ringer, MD Primary Cardiologist:  Dr Bronson Ing  CC:  cough  [name] is a 82 y.o. male who presents today for routine electrophysiology followup.  Since last being seen in our clinic, the patient reports doing very well.   He is doing well.  He is moving to [employer] in [place] soon.  He has occasional cough.  No other issues. Today, he denies symptoms of palpitations, chest pain,   lower extremity edema, dizziness, presyncope, or syncope.  The patient is otherwise without complaint today.   Past Medical History:  Diagnosis Date  . Anxiety   . Arthritis   . CAD (coronary artery disease)    a.  cath 5/12: mLAD 50% then tandem 50%, dLAD 30%; D1 30%; OM1 occluded with collateral flow; pRCA 25%; EF 55% with inf HK;  occluded OM likely cause of perfusion defect on Myoview  . Colitis   . COPD (chronic obstructive pulmonary disease) ([place])   . Dementia    short term  . Depression   . Dizziness   . Dyslipidemia   . GERD (gastroesophageal reflux disease)   . Gout   . Hearing disorder   . Hyperlipidemia   . Hypertension   . Hypothyroidism   . Nonspecific abnormal unspecified cardiovascular function study   . Pacemaker   . Pelvic fracture ([place])   . Persistent atrial fibrillation ([place])   . Skin disorder    blister formation  . Tachy-brady syndrome ([place])    s/p pacer  . Ulcerative colitis    Past Surgical History:  Procedure Laterality Date  . BACK SURGERY  1975  . CARDIAC CATHETERIZATION  09/20/10   EF 55%.....Dr. Percival Spanish  . CATARACT EXTRACTION Right   . CATARACT EXTRACTION W/PHACO Left 12/17/2014   Procedure: CATARACT EXTRACTION PHACO AND INTRAOCULAR LENS PLACEMENT (IOC);  Surgeon: Tonny Branch, MD;  Location: [facility];  Service: Ophthalmology;  Laterality: Left;  CDE 7.44  . EP IMPLANTABLE DEVICE N/A 04/02/2015   Procedure:  PPM Generator Changeout;  Surgeon: Thompson Grayer, MD;  Location: [facility];  Service: Cardiovascular;  Laterality: N/A;  .  Implantation of a Medtronic dual chamber pacemaker  02/01/06   [name] [name] MD  . Randolm Idol / REPLACE / REMOVE PACEMAKER     MEDTRONIC....PPM Serial Number:  [id number].....Marland KitchenPPM Model Number:  [id number]....Marland KitchenPPM DOI:  02/01/2006    . KNEE SURGERY  2005   replacement  . LEFT HEART CATHETERIZATION WITH CORONARY ANGIOGRAM N/A 07/06/2014   Procedure: LEFT HEART CATHETERIZATION WITH CORONARY ANGIOGRAM;  Surgeon: Sinclair Grooms, MD;  Location: [facility] CATH LAB;  Service: Cardiovascular;  Laterality: N/A;  . Left knee arthroscopy with debridement  04/23/2006   [name] M.D  . Left knee total knee  replacement  05/13/07   [name] M.D.  . LUMBAR SPINE SURGERY      Current Outpatient Prescriptions  Medication Sig Dispense Refill  . acetaminophen (TYLENOL) 500 MG tablet Take 500 mg by mouth every 6 (six) hours as needed for mild pain (takes as needed for arthritis pain).    Jearl Klinefelter ELLIPTA 62.5-25 MCG/INH AEPB INHALE ONE PUFF INTO THE LUNGS DAILY. 60 each 5  . camphor-menthol (SARNA) lotion Apply 1 application topically as needed for itching.    . Cholecalciferol (VITAMIN D) 2000 UNITS CAPS Take 1 capsule by mouth daily with supper. Patient takes 5000 units.    Marland Kitchen docusate sodium (COLACE) 100 MG capsule Take 100 mg by mouth daily as  needed for mild constipation.    Marland Kitchen HYDROcodone-acetaminophen (NORCO/VICODIN) 5-325 MG tablet Take 1 tablet by mouth every 6 (six) hours as needed. 6 tablet 0  . isosorbide mononitrate (IMDUR) 30 MG 24 hr tablet Take 1 tablet (30 mg total) by mouth 2 (two) times daily. 180 tablet 3  . levothyroxine (SYNTHROID, LEVOTHROID) 50 MCG tablet Take 50 mcg by mouth daily before breakfast.     . MAGNESIUM PO Take 3 tablets by mouth at bedtime.    . methotrexate (50 MG/ML) 1 g injection Inject 50 mg into the vein once a week. On Thursday.    . metoprolol (LOPRESSOR) 50 MG tablet Take 50 mg by mouth 2 (two) times daily.    . nitroGLYCERIN (NITROSTAT) 0.4 MG SL tablet Place 1  tablet (0.4 mg total) under the tongue every 5 (five) minutes as needed for chest pain. 25 tablet 3  . rivaroxaban (XARELTO) 20 MG TABS tablet Take 1 tablet (20 mg total) by mouth daily with supper. 28 tablet 0  . traZODone (DESYREL) 50 MG tablet Take 1 tablet by mouth at bedtime.    . triamcinolone cream (KENALOG) 0.1 % Apply 1 application topically as directed.  4   No current facility-administered medications for this visit.     Physical Exam: Vitals:   07/17/16 0939  BP: 109/72  Pulse: 64  SpO2: 98%  Weight: 138 lb 12.8 oz (63 kg)  Height: 6' 2"  (1.88 m)    GEN- The patient is well appearing, alert and oriented x 3 today.   Head- normocephalic, atraumatic Eyes-  Sclera clear, conjunctiva pink Ears- hearing intact Oropharynx- clear Lungs- Clear to ausculation bilaterally, normal work of breathing Chest- pacemaker pocket is well healed Heart- Regular rate and rhythm, no murmurs, rubs or gallops, PMI not laterally displaced GI- soft, NT, ND, + BS Extremities- no clubbing, cyanosis, or edema  Pacemaker interrogation- reviewed in detail today,  See PACEART report  Assessment and Plan:  1. Tachycardia/ Bradycardia Normal pacemaker function See Pace Art report Programmed VVIR today due to permanent afib  2. Permanent afib Continue xarelto long term Echo 4/17 is reviewed and reveals preserved EF  3. CAD No ischemic symptoms  Merlin Return to see me in 1 year in [place] Follow-up with General Cardiology in 6 months.  As he is moving to [place], I will refer to Dr Marisue Humble Truong Delcastillo,MD

## 2016-07-17 NOTE — Patient Instructions (Addendum)
Medication Instructions:  Continue all current medications.  Labwork: none  Testing/Procedures: none  Follow-Up:  Your physician wants you to follow up in: 6 months.  You will receive a reminder letter in the mail one-two months in advance.  If you don't receive a letter, please call our office to schedule the follow up appointment.  - to establish with Dr. Marlou Porch in [facility] office. (routine cardiac care)   Your physician wants you to follow up in:  1 year.  You will receive a reminder letter in the mail one-two months in advance.  If you don't receive a letter, please call our office to schedule the follow up appointment - [place].   Any Other Special Instructions Will Be Listed Below (If Applicable). Remote monitoring is used to monitor your Pacemaker of ICD from home. This monitoring reduces the number of office visits required to check your device to one time per year. It allows [place] to keep an eye on the functioning of your device to ensure it is working properly. You are scheduled for a device check from home on 10/19/2016.  You may send your transmission at any time that day. If you have a wireless device, the transmission will be sent automatically. After your physician reviews your transmission, you will receive a postcard with your next transmission date.  If you need a refill on your cardiac medications before your next appointment, please call your pharmacy.

## 2016-07-17 NOTE — Progress Notes (Signed)
PCP: Tivis Ringer, MD Primary Cardiologist:  Dr Bronson Ing  CC: fatigue  [name] is a 82 y.o. male who presents today for routine electrophysiology followup.  Since last being seen in our clinic, the patient reports doing very well.   He is doing well.  Today, he denies symptoms of palpitations, chest pain,   lower extremity edema, dizziness, presyncope, or syncope.  The patient is otherwise without complaint today.   Past Medical History:  Diagnosis Date  . Anxiety   . Arthritis   . CAD (coronary artery disease)    a.  cath 5/12: mLAD 50% then tandem 50%, dLAD 30%; D1 30%; OM1 occluded with collateral flow; pRCA 25%; EF 55% with inf HK;  occluded OM likely cause of perfusion defect on Myoview  . Colitis   . COPD (chronic obstructive pulmonary disease) ([place])   . Dementia    short term  . Depression   . Dizziness   . Dyslipidemia   . GERD (gastroesophageal reflux disease)   . Gout   . Hearing disorder   . Hyperlipidemia   . Hypertension   . Hypothyroidism   . Nonspecific abnormal unspecified cardiovascular function study   . Pacemaker   . Pelvic fracture ([place])   . Persistent atrial fibrillation ([place])   . Skin disorder    blister formation  . Tachy-brady syndrome ([place])    s/p pacer  . Ulcerative colitis    Past Surgical History:  Procedure Laterality Date  . BACK SURGERY  1975  . CARDIAC CATHETERIZATION  09/20/10   EF 55%.....Dr. Percival Spanish  . CATARACT EXTRACTION Right   . CATARACT EXTRACTION W/PHACO Left 12/17/2014   Procedure: CATARACT EXTRACTION PHACO AND INTRAOCULAR LENS PLACEMENT (IOC);  Surgeon: Tonny Branch, MD;  Location: [facility];  Service: Ophthalmology;  Laterality: Left;  CDE 7.44  . EP IMPLANTABLE DEVICE N/A 04/02/2015   Procedure:  PPM Generator Changeout;  Surgeon: Thompson Grayer, MD;  Location: [facility];  Service: Cardiovascular;  Laterality: N/A;  . Implantation of a Medtronic dual chamber pacemaker  02/01/06   [name] [name] MD  . Randolm Idol /  REPLACE / REMOVE PACEMAKER     MEDTRONIC....PPM Serial Number:  [id number].....Marland KitchenPPM Model Number:  [id number]....Marland KitchenPPM DOI:  02/01/2006    . KNEE SURGERY  2005   replacement  . LEFT HEART CATHETERIZATION WITH CORONARY ANGIOGRAM N/A 07/06/2014   Procedure: LEFT HEART CATHETERIZATION WITH CORONARY ANGIOGRAM;  Surgeon: Sinclair Grooms, MD;  Location: [facility] CATH LAB;  Service: Cardiovascular;  Laterality: N/A;  . Left knee arthroscopy with debridement  04/23/2006   [name] M.D  . Left knee total knee  replacement  05/13/07   [name] M.D.  . LUMBAR SPINE SURGERY      Current Outpatient Prescriptions  Medication Sig Dispense Refill  . acetaminophen (TYLENOL) 500 MG tablet Take 500 mg by mouth every 6 (six) hours as needed for mild pain (takes as needed for arthritis pain).    Jearl Klinefelter ELLIPTA 62.5-25 MCG/INH AEPB INHALE ONE PUFF INTO THE LUNGS DAILY. 60 each 5  . camphor-menthol (SARNA) lotion Apply 1 application topically as needed for itching.    . Cholecalciferol (VITAMIN D) 2000 UNITS CAPS Take 1 capsule by mouth daily with supper. Patient takes 5000 units.    Marland Kitchen docusate sodium (COLACE) 100 MG capsule Take 100 mg by mouth daily as needed for mild constipation.    Marland Kitchen HYDROcodone-acetaminophen (NORCO/VICODIN) 5-325 MG tablet Take 1 tablet by mouth every  6 (six) hours as needed. 6 tablet 0  . isosorbide mononitrate (IMDUR) 30 MG 24 hr tablet Take 1 tablet (30 mg total) by mouth 2 (two) times daily. 180 tablet 3  . levothyroxine (SYNTHROID, LEVOTHROID) 50 MCG tablet Take 50 mcg by mouth daily before breakfast.     . MAGNESIUM PO Take 3 tablets by mouth at bedtime.    . methotrexate (50 MG/ML) 1 g injection Inject 50 mg into the vein once a week. On Thursday.    . metoprolol (LOPRESSOR) 50 MG tablet Take 50 mg by mouth 2 (two) times daily.    . nitroGLYCERIN (NITROSTAT) 0.4 MG SL tablet Place 1 tablet (0.4 mg total) under the tongue every 5 (five) minutes as needed for chest pain. 25  tablet 3  . rivaroxaban (XARELTO) 20 MG TABS tablet Take 1 tablet (20 mg total) by mouth daily with supper. 28 tablet 0  . traZODone (DESYREL) 50 MG tablet Take 1 tablet by mouth at bedtime.    . triamcinolone cream (KENALOG) 0.1 % Apply 1 application topically as directed.  4   No current facility-administered medications for this visit.     Physical Exam: Vitals:   07/17/16 0939  BP: 109/72  Pulse: 64  SpO2: 98%  Weight: 138 lb 12.8 oz (63 kg)  Height: 6' 2"  (1.88 m)    GEN- The patient is well appearing, alert and oriented x 3 today.   Head- normocephalic, atraumatic Eyes-  Sclera clear, conjunctiva pink Ears- hearing intact Oropharynx- clear Lungs- Clear to ausculation bilaterally, normal work of breathing Chest- pacemaker pocket is well healed Heart- Regular rate and rhythm, no murmurs, rubs or gallops, PMI not laterally displaced GI- soft, NT, ND, + BS Extremities- no clubbing, cyanosis, or edema  Pacemaker interrogation- reviewed in detail today,  See PACEART report  Assessment and Plan:  1. Tachycardia/ Bradycardia Normal pacemaker function See Pace Art report No changes today  2. Persistent afib amiodarone stopped by Dr Bronson Ing (per patient) Today, we discussed rate vs rhythm control (as per Dr Tanna Furry recent note).  The patient is clear that he would prefer to continue rate control rather than consider tikosyn or other AADs.  Will reprogram device VVIR upon return if he remains in afib upon return to see me in 1 year Continue xarelto long term Echo to evaluate for structural abnormality  3. CAD No ischemic symptoms  Merlin Return to see me in 1 year Follow-up with Dr Bronson Ing as scheduled   Trude Mcburney

## 2016-07-21 ENCOUNTER — Encounter (INDEPENDENT_AMBULATORY_CARE_PROVIDER_SITE_OTHER): Payer: Self-pay | Admitting: Internal Medicine

## 2016-07-21 ENCOUNTER — Ambulatory Visit (INDEPENDENT_AMBULATORY_CARE_PROVIDER_SITE_OTHER): Payer: Medicare Other | Admitting: Internal Medicine

## 2016-07-21 VITALS — BP 110/76 | HR 70 | Temp 97.2°F | Resp 18 | Ht 73.0 in

## 2016-07-21 DIAGNOSIS — I25118 Atherosclerotic heart disease of native coronary artery with other forms of angina pectoris: Secondary | ICD-10-CM | POA: Diagnosis not present

## 2016-07-21 DIAGNOSIS — K5100 Ulcerative (chronic) pancolitis without complications: Secondary | ICD-10-CM

## 2016-07-21 DIAGNOSIS — R634 Abnormal weight loss: Secondary | ICD-10-CM

## 2016-07-21 LAB — CUP PACEART INCLINIC DEVICE CHECK
Battery Voltage: 2.99 V
Brady Statistic RA Percent Paced: 0 %
Brady Statistic RV Percent Paced: 18 %
Implantable Lead Implant Date: 20070924
Implantable Lead Location: 753860
Implantable Lead Model: 5076
Lead Channel Impedance Value: 462.5 Ohm
Lead Channel Pacing Threshold Amplitude: 1 V
Lead Channel Pacing Threshold Pulse Width: 0.4 ms
Lead Channel Sensing Intrinsic Amplitude: 2.7 mV
Lead Channel Sensing Intrinsic Amplitude: 9.6 mV
Lead Channel Setting Pacing Amplitude: 2.5 V
MDC IDC LEAD IMPLANT DT: 20070924
MDC IDC LEAD LOCATION: 753859
MDC IDC MSMT LEADCHNL RV IMPEDANCE VALUE: 400 Ohm
MDC IDC MSMT LEADCHNL RV PACING THRESHOLD AMPLITUDE: 1 V
MDC IDC MSMT LEADCHNL RV PACING THRESHOLD PULSEWIDTH: 0.4 ms
MDC IDC PG IMPLANT DT: 20161122
MDC IDC SESS DTM: 20180309153900
MDC IDC SET LEADCHNL RV PACING PULSEWIDTH: 0.4 ms
MDC IDC SET LEADCHNL RV SENSING SENSITIVITY: 2 mV
Pulse Gen Model: 2240
Pulse Gen Serial Number: 7826824

## 2016-07-21 NOTE — Patient Instructions (Signed)
Call if you have diarrhea or rectal bleeding.

## 2016-07-21 NOTE — Progress Notes (Signed)
Presenting complaint;  Follow-up for ulcerative colitis.  Subjective:  Patient is 82 year old Caucasian male who has chronic ulcerative colitis and is here for scheduled visit. He is accompanied by his wife. He was last seen in February 2017. He has lost 20 pounds since his last visit. His wife states he fell in December 2017 and sustained facial bone fracture and lost 3 teeth. He was placed on liquid diet for a while. He his diet has just been operated a soft food. He is still having some discomfort on chewing. He been drinking 2 cans of ensure daily. He denies nausea vomiting heartburn dysphagia or abdominal pain. He has 1 formed stool daily. Since he has been on methotrexate he has not had itching whatsoever. His wife states that they're relocating to retirement community in [place] on 08/19/2016. He gets regular blood work since he is on methotrexate.    Current Medications: Outpatient Encounter Prescriptions as of 07/21/2016  Medication Sig  . acetaminophen (TYLENOL) 500 MG tablet Take 500 mg by mouth every 6 (six) hours as needed for mild pain (takes as needed for arthritis pain).  Jearl Klinefelter ELLIPTA 62.5-25 MCG/INH AEPB INHALE ONE PUFF INTO THE LUNGS DAILY.  . camphor-menthol (SARNA) lotion Apply 1 application topically as needed for itching.  . Cholecalciferol (VITAMIN D) 2000 UNITS CAPS Take 1 capsule by mouth daily with supper.   . docusate sodium (COLACE) 100 MG capsule Take 100 mg by mouth daily as needed for mild constipation.  . isosorbide mononitrate (IMDUR) 30 MG 24 hr tablet Take 1 tablet (30 mg total) by mouth 2 (two) times daily.  Marland Kitchen levothyroxine (SYNTHROID, LEVOTHROID) 50 MCG tablet Take 50 mcg by mouth daily before breakfast.   . MAGNESIUM PO Take 3 tablets by mouth at bedtime.  . methotrexate (50 MG/ML) 1 g injection Inject 50 mg into the vein once a week. On Thursday.  . metoprolol (LOPRESSOR) 50 MG tablet Take 50 mg by mouth 2 (two) times daily.  . nitroGLYCERIN  (NITROSTAT) 0.4 MG SL tablet Place 1 tablet (0.4 mg total) under the tongue every 5 (five) minutes as needed for chest pain.  . rivaroxaban (XARELTO) 20 MG TABS tablet Take 1 tablet (20 mg total) by mouth daily with supper.  . traZODone (DESYREL) 50 MG tablet Take 1 tablet by mouth at bedtime.  . triamcinolone cream (KENALOG) 0.1 % Apply 1 application topically as directed.  . [DISCONTINUED] HYDROcodone-acetaminophen (NORCO/VICODIN) 5-325 MG tablet Take 1 tablet by mouth every 6 (six) hours as needed. (Patient not taking: Reported on 07/21/2016)   No facility-administered encounter medications on file as of 07/21/2016.      Objective: Blood pressure 110/76, pulse 70, temperature 97.2 F (36.2 C), temperature source Oral, resp. rate 18, height 6' 1"  (1.854 m). Patient is alert and in no acute distress. Conjunctiva is pink. Sclera is nonicteric Oropharyngeal mucosa is normal. No neck masses or thyromegaly noted. Cardiac exam with regular rhythm normal S1 and S2. No murmur or gallop noted. Lungs are clear to auscultation. Abdomen is flat and soft without tenderness organomegaly or masses.  No LE edema or clubbing noted.  Labs/studies Results: H&H from 05/24/2016  12.5/37.1.    Assessment:  #1. Chronic ulcerative colitis. He remains in remission. He has not had colonoscopy in over 5 years. He is on methotrexate primarily for autoimmune skin disorder but it is also keeping UC in remission. He has mild anemia which may be related to his medication. #2. Weight loss. Weight loss appears to  be secondary to desiccation disorder secondary to facial fracture resulting from a fall. Not that his diet has been upgraded he should be able to gain weight.    Plan:  Patient encouraged to eat by mouth intake. No oral mesalamine while patient is on methotrexate. Weight check in 1 months. Office visit in one year.

## 2016-07-23 ENCOUNTER — Telehealth: Payer: Self-pay | Admitting: Cardiovascular Disease

## 2016-07-23 MED ORDER — METOPROLOL TARTRATE 50 MG PO TABS: 50.0000 mg | ORAL_TABLET | Freq: Two times a day (BID) | ORAL | 3 refills | Status: DC

## 2016-07-23 NOTE — Telephone Encounter (Signed)
metoprolol (LOPRESSOR) 50 MG tablet   [facility]

## 2016-07-23 NOTE — Telephone Encounter (Signed)
Done

## 2016-07-28 DIAGNOSIS — Z950 Presence of cardiac pacemaker: Secondary | ICD-10-CM | POA: Diagnosis not present

## 2016-07-28 DIAGNOSIS — E039 Hypothyroidism, unspecified: Secondary | ICD-10-CM | POA: Diagnosis not present

## 2016-07-28 DIAGNOSIS — J449 Chronic obstructive pulmonary disease, unspecified: Secondary | ICD-10-CM | POA: Diagnosis not present

## 2016-07-28 DIAGNOSIS — I480 Paroxysmal atrial fibrillation: Secondary | ICD-10-CM | POA: Diagnosis not present

## 2016-07-28 DIAGNOSIS — Z111 Encounter for screening for respiratory tuberculosis: Secondary | ICD-10-CM | POA: Diagnosis not present

## 2016-07-28 DIAGNOSIS — I2510 Atherosclerotic heart disease of native coronary artery without angina pectoris: Secondary | ICD-10-CM | POA: Diagnosis not present

## 2016-07-28 DIAGNOSIS — E784 Other hyperlipidemia: Secondary | ICD-10-CM | POA: Diagnosis not present

## 2016-07-28 DIAGNOSIS — R634 Abnormal weight loss: Secondary | ICD-10-CM | POA: Diagnosis not present

## 2016-07-28 DIAGNOSIS — R413 Other amnesia: Secondary | ICD-10-CM | POA: Diagnosis not present

## 2016-07-28 DIAGNOSIS — K529 Noninfective gastroenteritis and colitis, unspecified: Secondary | ICD-10-CM | POA: Diagnosis not present

## 2016-07-28 DIAGNOSIS — I10 Essential (primary) hypertension: Secondary | ICD-10-CM | POA: Diagnosis not present

## 2016-07-28 DIAGNOSIS — L409 Psoriasis, unspecified: Secondary | ICD-10-CM | POA: Diagnosis not present

## 2016-08-18 ENCOUNTER — Other Ambulatory Visit: Payer: Self-pay | Admitting: Cardiovascular Disease

## 2016-09-01 ENCOUNTER — Encounter: Payer: Self-pay | Admitting: Neurology

## 2016-09-01 ENCOUNTER — Ambulatory Visit (INDEPENDENT_AMBULATORY_CARE_PROVIDER_SITE_OTHER): Payer: Medicare Other | Admitting: Neurology

## 2016-09-01 VITALS — BP 132/80 | HR 74 | Ht 73.0 in | Wt 145.0 lb

## 2016-09-01 DIAGNOSIS — F0390 Unspecified dementia without behavioral disturbance: Secondary | ICD-10-CM

## 2016-09-01 DIAGNOSIS — I25118 Atherosclerotic heart disease of native coronary artery with other forms of angina pectoris: Secondary | ICD-10-CM

## 2016-09-01 MED ORDER — MEMANTINE HCL ER 7 MG PO CP24: 7.0000 mg | ORAL_CAPSULE | Freq: Every day | ORAL | 3 refills | Status: DC

## 2016-09-01 NOTE — Patient Instructions (Addendum)
Your memory has become worse in the past 2 years: I would like to suggest starting you on a memory medication, called Namenda XR, starting at 7 mg once daily with gradual build up. Side effects include: nausea, confusion, hallucination, personality changes. If you are having mild side effects, try to stick with the treatment as these initial side effects may go away after the first 10-14 days.     We will do a 3 month check up and try to increase the medication to 14 mg at the time.

## 2016-09-01 NOTE — Progress Notes (Signed)
Subjective:    Patient ID: [name] is a 82 y.o. male.  HPI     Interim history:   [name] is a 82 year old right-handed gentleman with a complex underlying medical history of paroxysmal A. fib , coronary artery disease (followed by Dr. Bronson Ing), status post pacemaker placement in 2007 secondary to tachycardia-bradycardia syndrome, COPD, arthritis, status post left total knee replacement in 2008, degenerative back disease, status post lumbar spine surgery in the 70s, hypothyroidism, hearing loss, anxiety, depression, gout, hypertension, hyperlipidemia, ulcerative colitis (followed by Dr. Laural Golden), who presents for follow-up consultation of his memory loss, after a long gap of over 2 years. The patient is accompanied by his wife and 2 daughters today. I last saw him on 08/17/2014, at which time he reported more forgetfulness and confusion. He was still driving but had some trouble driving including on familiar routes. He saw his pulmonologist and was referred to pulmonary rehabilitation. He had no recent falls. I suggested a 3 month checkup. He did not return.   Today, 09/01/2016: He reports doing okay. He had a fall in 12/17 with injury to hand and chin. He had a CTH at the time. He sees Dr. Raliegh Ip for cardiology FU.  Moved to [place] some 2 weeks ago. Adjusting well, had some rare recent VH.  No major mood d/o, but does have a flareup of depressive mood and anxiety. He does have a long history of depression and anxiety and has tried multiple medications for this in the past, typically with some side effects reported. He has an appointment pending with his primary care physician in a couple of weeks and is encouraged to discuss this with him as well. He does not exercise very much, does walk the dog. Appetite is good, weight has stabilized especially since they moved into [place] and they go to the dining hall typically twice daily.  He had a recent head CT on 05/24/2016  without contrast which I reviewed: IMPRESSION: 1. No acute abnormality. 2. Moderate diffuse cerebral and cerebellar atrophy. 3. Extensive chronic small vessel white matter ischemic changes in both cerebral hemispheres. 4. Old left thalamic lacunar infarct.   He has a history of paresthesias affectig both hands and both feet. This has become worse as well, particularly after he had a fall and injured his hand. He till takes methotrexate.   Previously (copied from previous notes for reference):   I first met him on 06/08/2014 at the request of his primary care physician, at which time he reported a 1+ year history of problems particularly with his short-term memory, with forgetfulness being his most pressing concern. I ordered some blood work. ANA, RPR, rheumatoid factor, CRP, vitamin B1, and Lyme titer were fine and we called him with the test results. We also called him with his pulse oximetry test results from recently and they were reassured that he did not need supplemental oxygen.   He has problems with short-term memory particularly with forgetfulness but there is no report of confusion, delusions, or hallucinations. He was recently placed on Aricept but this was discontinued because of side effects in particular severe fatigue. He also had mood related side effects such as depressive symptoms. He has actually tried several different antidepressants which also caused side effects. He has a tendency to be quite sensitive to medications. In particular, he has had quite a bit of change in his blood pressure and heart medications. He quit smoking in the 80s but has a 30-pack-year history  of smoking.   He had a CT head without contrast on 07/22/2012 after a fall: No acute intracranial abnormality.  Extensive chronic small vessel ischemic changes in the periventricular white matter. Slight atrophy. In addition, personally reviewed the images through the PACS system.   He had to home sleep studies  which did not show any significant obstructive apnea but he did have some desaturations. He also had a overnight pulse oximetry test from what I understand in late November last year but the results are not available for review today. According to his daughter, there were desaturations during sleep but not necessarily related to obstructive breathing events. Of note, he does have a history of asbestos exposure during his work time and sees a pulmonologist in [place], [place] once a year for that. 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    He reports paresthesias in both upper extremities, also some pain. This is restricted to his fingers and toes. He feels that his toes are numb. He has some grip weakness in both hands. He also has a trigger finger on the left side in the middle finger. He had an injury to his left ring finger tip years ago, probably in the 56s. He has had chronic itching for decades. When he was on methotrexate for his ulcerative colitis is itching improved. Nevertheless, he is currently not on methotrexate and was on it on 2 different occasions.    He also sees a dermatologist.    In your office note from 03/28/2014 you mention his B12 and TSH, which have been normal, electrolytes were normal as well.   His Past Medical History Is Significant For: Past Medical History:  Diagnosis Date  . Anxiety   . Arthritis   . CAD (coronary artery disease)    a.  cath 5/12: mLAD 50% then tandem 50%, dLAD 30%; D1 30%; OM1 occluded with collateral flow; pRCA 25%; EF 55% with inf HK;  occluded OM likely cause of perfusion defect on Myoview  . Colitis   . COPD (chronic obstructive pulmonary disease) ([place])   . Dementia    short term  . Depression   . Dizziness   .  Dyslipidemia   . GERD (gastroesophageal reflux disease)   . Gout   . Hearing disorder   . Hyperlipidemia   . Hypertension   . Hypothyroidism   . Nonspecific abnormal unspecified cardiovascular function study   . Pacemaker   . Pelvic fracture ([place])   . Persistent atrial fibrillation ([place])   . Skin disorder    blister formation  . Tachy-brady syndrome ([place])    s/p pacer  . Ulcerative colitis     His Past Surgical History Is Significant For: Past Surgical History:  Procedure Laterality Date  . BACK SURGERY  1975  . CARDIAC CATHETERIZATION  09/20/10   EF 55%.....Dr. Percival Spanish  . CATARACT EXTRACTION Right   . CATARACT EXTRACTION W/PHACO Left 12/17/2014   Procedure: CATARACT EXTRACTION PHACO AND INTRAOCULAR LENS PLACEMENT (IOC);  Surgeon: Tonny Branch, MD;  Location: [facility];  Service: Ophthalmology;  Laterality: Left;  CDE 7.44  . EP IMPLANTABLE DEVICE N/A 04/02/2015   Procedure:  PPM Generator Changeout;  Surgeon: Thompson Grayer, MD;  Location: [facility];  Service: Cardiovascular;  Laterality: N/A;  . Implantation of a Medtronic dual chamber pacemaker  02/01/06   [name] [name] MD  . Randolm Idol / REPLACE / REMOVE PACEMAKER     MEDTRONIC....PPM Serial Number:  [id number].....Marland KitchenPPM Model Number:  [id number]....Marland KitchenPPM DOI:  02/01/2006    . KNEE SURGERY  2005   replacement  . LEFT HEART CATHETERIZATION WITH CORONARY ANGIOGRAM N/A 07/06/2014   Procedure: LEFT HEART CATHETERIZATION WITH CORONARY ANGIOGRAM;  Surgeon: Sinclair Grooms, MD;  Location: [facility];  Service: Cardiovascular;  Laterality: N/A;  . Left knee arthroscopy with debridement  04/23/2006   [name] M.D  . Left knee total knee  replacement  05/13/07   [name] M.D.  . LUMBAR SPINE SURGERY      His Family History Is Significant For: Family History  Problem Relation Age of Onset  . Lung disease Father     Emphysema  . Hypertension Father   . Peptic Ulcer Disease Father   . COPD Father   . Hypertension  Mother   . Liver cancer Mother   . Pernicious anemia Mother   . Depression Mother   . Kidney disease Mother   . Colitis Mother   . Asthma Sister   . Healthy Daughter   . Healthy Daughter   . Healthy Son   . COPD Sister   . Glaucoma Sister   . Hypertension Sister   . Cancer Sister     His Social History Is Significant For: Social History   Social History  . Marital status: Married    Spouse name: [name]  . Number of children: 3  . Years of education: 12   Occupational History  . retired Retired   Social History Main Topics  . Smoking status: Former Smoker    Packs/day: 1.00    Years: 15.00    Types: Cigarettes    Start date: 05/13/1955    Quit date: 05/12/1983  . Smokeless tobacco: Never Used  . Alcohol use No  . Drug use: No  . Sexual activity: Yes    Birth control/ protection: None   Other Topics Concern  . None   Social History Narrative   One cup of caffeine daily    His Allergies Are:  Allergies  Allergen Reactions  . Nsaids     Colitis, upset stomach  :   His Current Medications Are:  Outpatient Encounter Prescriptions as of 09/01/2016  Medication Sig  . acetaminophen (TYLENOL) 500 MG tablet Take 500 mg by mouth every 6 (six) hours as needed for mild pain (takes as needed for arthritis pain).  Jearl Klinefelter ELLIPTA 62.5-25 MCG/INH AEPB INHALE ONE PUFF INTO THE LUNGS DAILY.  . camphor-menthol (SARNA) lotion Apply 1 application topically as needed for itching.  . Cholecalciferol (VITAMIN D) 2000 UNITS CAPS Take 1 capsule by mouth daily with supper.   . docusate sodium (COLACE) 100 MG capsule Take 100 mg by mouth daily as needed for mild constipation.  . isosorbide mononitrate (IMDUR) 30 MG 24 hr tablet Take 1 tablet (30 mg total) by mouth 2 (two) times daily.  Marland Kitchen levothyroxine (SYNTHROID, LEVOTHROID) 50 MCG tablet Take 50 mcg by mouth daily before breakfast.   . MAGNESIUM PO Take 3 tablets by mouth at bedtime.  . methotrexate (50 MG/ML) 1 g injection Inject  50 mg into the vein once a week. On Thursday.  . metoprolol (LOPRESSOR) 50 MG tablet Take 1 tablet (50 mg total) by mouth 2 (two) times daily.  Marland Kitchen  nitroGLYCERIN (NITROSTAT) 0.4 MG SL tablet Place 1 tablet (0.4 mg total) under the tongue every 5 (five) minutes as needed for chest pain.  . rivaroxaban (XARELTO) 20 MG TABS tablet Take 1 tablet (20 mg total) by mouth daily with supper.  . traZODone (DESYREL) 50 MG tablet Take 1 tablet by mouth at bedtime.  . [DISCONTINUED] triamcinolone cream (KENALOG) 0.1 % Apply 1 application topically as directed.   No facility-administered encounter medications on file as of 09/01/2016.   :  Review of Systems:  Out of a complete 14 point review of systems, all are reviewed and negative with the exception of these symptoms as listed below: Review of Systems  Neurological:       Pt presents today to discuss his memory, which his family reports as worsening. Pt's hands are also numb, per family's report. Pt just moved to [place] 2 weeks ago.    Objective:  Neurologic Exam  Physical Exam Physical Examination:   Vitals:   09/01/16 1302  BP: 132/80  Pulse: 74   General Examination: The patient is a very pleasant 82 y.o. male in no acute distress. He appears mildly frail.  Fairly quiet, but answers appropriately.   HEENT: Normocephalic, atraumatic, pupils are equal, round and reactive to light and accommodation. Extraocular tracking shows mild saccadic breakdown without nystagmus noted. Hearing is mildly impaired, L hearing aid in place. Face is symmetric with no facial masking and normal facial sensation. There is no lip, neck or jaw tremor. Neck is not rigid with intact passive ROM. There are no carotid bruits on auscultation. Oropharynx exam reveals mild mouth dryness. No significant airway crowding is noted. Mallampati is class II. Tongue protrudes centrally and palate elevates symmetrically. Mildly hoarse and higher pitched voice, unchanged.    Chest: is clear to auscultation without wheezing, rhonchi or crackles noted.  Heart: sounds are regular and normal without murmurs, rubs or gallops noted.   Abdomen: is soft, non-tender and non-distended with normal bowel sounds appreciated on auscultation.  Extremities: There is no pitting edema in the distal lower extremities bilaterally. Pedal pulses are intact but faint and feet are cold and slightly purplish discolored.   Skin: is warm and dry otherwise with no trophic changes noted. Age-related changes are noted on the skin and multiple old bruises.   Musculoskeletal: exam reveals no obvious joint deformities, tenderness or joint swelling or erythema. Changes consistent with OA of the hands are noted bilaterally.   Neurologically:  Mental status: The patient is awake and alert, paying good  attention. He is able to partially provide the history. His family provides details. He is oriented to: person, situation. His memory, attention, language and knowledge are impaired and worse from 2 y ago. Mood is congruent and affect is normal.   On 06/08/2014: MMSE 24/30, CDT: 4/4, AFT: 13/min.   On 09/01/2016: MMSE: 16/30, CDT: 4/4, AFT: 5/min.   Cranial nerves are as described above under HEENT exam. In addition, shoulder shrug is normal with equal shoulder height noted.  Motor exam: Normal bulk, and strength for age is noted, with the exception of mild decrease in grip strength bilaterally in the 4 out of 5 range. Tone is not rigid with absence of cogwheeling or bradykinesia. There is no drift or rebound. There is no tremor.  Romberg is negative. Reflexes are 1+ in the upper extremities and 1+ in the lower extremities, but absent in the ankles . Toes are downgoing bilaterally. Fine motor skills: globally mildly impaired.  Cerebellar testing shows no dysmetria or intention tremor on finger to nose testing. Heel to shin is unremarkable. There is no truncal or gait ataxia.   Sensory  exam is intact to light touch, pinprick, vibration, temperature sense in the upper and lower extremities, with the exception of decreased vibration sense in his feet.   Gait, station and balance: He stands up from the seated position with mild difficulty and pushes up, no assistance needed. Walks slowly and cautiously.   Assessment and Plan:   In summary, [name] is a very pleasant 82 y.o.-year old male with a complex underlying medical history of paroxysmal A. fib , coronary artery disease (followed by Dr. Bronson Ing), status post pacemaker placement in 2007 secondary to tachycardia-bradycardia syndrome, COPD, arthritis, status post left total knee replacement in 2008, degenerative back disease, status post lumbar spine surgery in the 70s, hypothyroidism, hearing loss, anxiety, depression, gout, hypertension, hyperlipidemia, ulcerative colitis (followed by Dr. Laural Golden), who presents for FU consultation of his memory loss , after a gap of over 2 years. His memory scores have declined in the past 2 years. He likely has moderate dementia without overt behavioral issues. He had a fall in December. He had a recent head CT which showed worsening atrophy and vascular changes. He has previously tried Aricept with side effects of depression reported. He has a long-standing history of depression and anxiety and also tried medication for this but had side effects unfortunately. He is at risk for vascular dementia. I suggested a trial of Namenda long-acting starting with 7 mg strength once daily. We talked about this medication, expectations and potential side effects. We talked about maintaining a healthy lifestyle, regular exercise regimen, good nutrition and good hydration. He had a recent hospitalization for syncopal spell. He has moved into independent living with his wife at [facility] and has been adjusting well. I suggested a 3 month follow-up with one of our nurse practitioners at which time we can try  to increase his Namenda long-acting to 14 mg once daily if tolerated. He has been reporting more paresthesias. He has arthritic changes to his hands, also has been on methotrexate for years. According to his daughter they tried to reduce or eliminate the methotrexate but he had worsening of generalized itching which has been a chronic problem for him and improved after he started methotrexate. They are advised to discuss with Dr. Dagmar Hait potential treatments for his anxiety/depression.  I answered all their questions today and the patient and his family were in agreement with the above outlined plan.  I spent 25 minutes in total face-to-face time with the patient, more than 50% of which was spent in counseling and coordination of care, reviewing test results, reviewing medication and discussing or reviewing the diagnosis of dementia, its prognosis and treatment options. Pertinent laboratory and imaging test results that were available during this visit with the patient were reviewed by me and considered in my medical decision making (see chart for details).

## 2016-09-15 DIAGNOSIS — R634 Abnormal weight loss: Secondary | ICD-10-CM | POA: Diagnosis not present

## 2016-09-15 DIAGNOSIS — R413 Other amnesia: Secondary | ICD-10-CM | POA: Diagnosis not present

## 2016-09-15 DIAGNOSIS — I10 Essential (primary) hypertension: Secondary | ICD-10-CM | POA: Diagnosis not present

## 2016-09-15 DIAGNOSIS — K529 Noninfective gastroenteritis and colitis, unspecified: Secondary | ICD-10-CM | POA: Diagnosis not present

## 2016-09-15 DIAGNOSIS — Z681 Body mass index (BMI) 19 or less, adult: Secondary | ICD-10-CM | POA: Diagnosis not present

## 2016-09-15 DIAGNOSIS — F39 Unspecified mood [affective] disorder: Secondary | ICD-10-CM | POA: Diagnosis not present

## 2016-09-21 ENCOUNTER — Telehealth: Payer: Self-pay | Admitting: Internal Medicine

## 2016-09-21 MED ORDER — UMECLIDINIUM-VILANTEROL 62.5-25 MCG/INH IN AEPB: INHALATION_SPRAY | RESPIRATORY_TRACT | 5 refills | Status: DC

## 2016-09-21 NOTE — Telephone Encounter (Signed)
Called and spoke with pts wife and she is aware of refill that has been sent to the pharmacy.   Pt has a pending appt with CY 11/2016.

## 2016-10-13 ENCOUNTER — Other Ambulatory Visit: Payer: Self-pay | Admitting: Cardiovascular Disease

## 2016-10-13 ENCOUNTER — Telehealth: Payer: Self-pay | Admitting: Internal Medicine

## 2016-10-13 MED ORDER — METOPROLOL TARTRATE 50 MG PO TABS: 50.0000 mg | ORAL_TABLET | Freq: Two times a day (BID) | ORAL | 1 refills | Status: DC

## 2016-10-13 MED ORDER — UMECLIDINIUM-VILANTEROL 62.5-25 MCG/INH IN AEPB: INHALATION_SPRAY | RESPIRATORY_TRACT | 3 refills | Status: DC

## 2016-10-13 NOTE — Telephone Encounter (Signed)
° ° °  1. Which medications need to be refilled? (please list name of each medication and dose if known)  metoprolol (LOPRESSOR) 50 MG tablet    2. Which pharmacy/location (including street and city if local pharmacy) is medication to be sent to?  [facility]    Fax #  [phone]    Phone #   [phone]   3. Do they need a 30 day or 90 day supply?    90 day    Please call patient at [phone]  If you have any questions.

## 2016-10-13 NOTE — Telephone Encounter (Signed)
Medication sent to pharmacy  

## 2016-10-13 NOTE — Telephone Encounter (Signed)
Pt needing 90 day refill on Anoro to mail order pharmacy.  Pt not seen in over 1 year, has pending appt with CY next month.  I advised pt to keep rov for future refills.  Pt expressed understanding.  rx sent to preferred pharmacy.  Nothing further needed.

## 2016-10-15 ENCOUNTER — Telehealth: Payer: Self-pay | Admitting: Internal Medicine

## 2016-10-15 NOTE — Telephone Encounter (Signed)
Spoke with pt's wife, Jearl Klinefelter is too expensive for pt- over $140/30 day supply.  Pt's wife called insurance company, was told that Anoro was tier 3 and that all inhalers on formulary were tier 3 or higher.  I advised that if this is the cheapest tier of medication on his formulary, we could try applying for financial assistance through the manufacturer.  Pt's wife was agreeable to this.  Application printed and placed in outgoing mail.  Nothing further needed at this time.

## 2016-10-19 ENCOUNTER — Ambulatory Visit (INDEPENDENT_AMBULATORY_CARE_PROVIDER_SITE_OTHER): Payer: Medicare Other | Admitting: *Deleted

## 2016-10-19 DIAGNOSIS — I495 Sick sinus syndrome: Secondary | ICD-10-CM

## 2016-10-19 NOTE — Progress Notes (Signed)
Remote pacemaker transmission.   

## 2016-10-20 DIAGNOSIS — Z79899 Other long term (current) drug therapy: Secondary | ICD-10-CM | POA: Diagnosis not present

## 2016-10-20 DIAGNOSIS — K5180 Other ulcerative colitis without complications: Secondary | ICD-10-CM | POA: Diagnosis not present

## 2016-10-20 DIAGNOSIS — Z681 Body mass index (BMI) 19 or less, adult: Secondary | ICD-10-CM | POA: Diagnosis not present

## 2016-10-20 DIAGNOSIS — L409 Psoriasis, unspecified: Secondary | ICD-10-CM | POA: Diagnosis not present

## 2016-10-21 ENCOUNTER — Encounter: Payer: Self-pay | Admitting: Cardiology

## 2016-10-21 LAB — CUP PACEART REMOTE DEVICE CHECK
Battery Remaining Longevity: 127 mo
Battery Voltage: 3.01 V
Brady Statistic RV Percent Paced: 29 %
Implantable Lead Implant Date: 20070924
Implantable Lead Location: 753860
Implantable Lead Model: 5076
Implantable Pulse Generator Implant Date: 20161122
Lead Channel Impedance Value: 400 Ohm
Lead Channel Pacing Threshold Amplitude: 1 V
Lead Channel Pacing Threshold Pulse Width: 0.4 ms
Lead Channel Setting Pacing Amplitude: 2.5 V
Lead Channel Setting Pacing Pulse Width: 0.4 ms
MDC IDC LEAD IMPLANT DT: 20070924
MDC IDC LEAD LOCATION: 753859
MDC IDC MSMT BATTERY REMAINING PERCENTAGE: 95.5 %
MDC IDC MSMT LEADCHNL RV SENSING INTR AMPL: 12 mV
MDC IDC SESS DTM: 20180611062024
MDC IDC SET LEADCHNL RV SENSING SENSITIVITY: 2 mV
Pulse Gen Serial Number: 7826824

## 2016-10-27 DIAGNOSIS — Z85828 Personal history of other malignant neoplasm of skin: Secondary | ICD-10-CM | POA: Diagnosis not present

## 2016-10-27 DIAGNOSIS — L259 Unspecified contact dermatitis, unspecified cause: Secondary | ICD-10-CM | POA: Diagnosis not present

## 2016-10-27 DIAGNOSIS — L570 Actinic keratosis: Secondary | ICD-10-CM | POA: Diagnosis not present

## 2016-11-04 ENCOUNTER — Telehealth: Payer: Self-pay | Admitting: Neurology

## 2016-11-04 NOTE — Telephone Encounter (Signed)
Dr. Augustina Mood patient's daughter is calling to advise the patient has stopped taking memantine (NAMENDA XR) 7 MG CP24 24 hr capsule. The medication made him so depressed. Since off medication he is back to his normal self and doing fine. The follow up appointment with [facility] on 12-03-16 was cancelled because it is not needed.

## 2016-11-05 ENCOUNTER — Encounter: Payer: Self-pay | Admitting: Cardiology

## 2016-11-06 NOTE — Telephone Encounter (Signed)
Wife [name]) notified.  6 mo f/u already scheduled for 11/10/2016 in [place] office with Dr. Bronson Ing.

## 2016-11-06 NOTE — Telephone Encounter (Signed)
Contacted by letter from Serita Grit, the patient's daughter. She reports the patient has been depressed and has had several falls. She also reports constant itching since starting Xarelto.  For all of the aforementioned reasons, she would prefer we stop Xarelto and perhaps switch to warfarin if any anticoagulant is truly needed. However, she mentions that he has had falls with hematomas and is concerned about an extracranial or intracranial bleed if he were to remain on anticoagulation and have another significant fall.  For this reason, I will opt to discontinue Xarelto and not initiate an alternative anticoagulant.

## 2016-11-06 NOTE — Addendum Note (Signed)
Addended by: Laurine Blazer on: 11/06/2016 06:24 PM   Modules accepted: Orders

## 2016-11-09 DIAGNOSIS — K5180 Other ulcerative colitis without complications: Secondary | ICD-10-CM | POA: Diagnosis not present

## 2016-11-09 DIAGNOSIS — Z681 Body mass index (BMI) 19 or less, adult: Secondary | ICD-10-CM | POA: Diagnosis not present

## 2016-11-09 DIAGNOSIS — L409 Psoriasis, unspecified: Secondary | ICD-10-CM | POA: Diagnosis not present

## 2016-11-09 DIAGNOSIS — Z79899 Other long term (current) drug therapy: Secondary | ICD-10-CM | POA: Diagnosis not present

## 2016-11-10 ENCOUNTER — Ambulatory Visit (INDEPENDENT_AMBULATORY_CARE_PROVIDER_SITE_OTHER): Payer: Medicare Other | Admitting: Cardiovascular Disease

## 2016-11-10 ENCOUNTER — Encounter: Payer: Self-pay | Admitting: Cardiovascular Disease

## 2016-11-10 VITALS — BP 124/80 | HR 66 | Ht 74.0 in | Wt 146.2 lb

## 2016-11-10 DIAGNOSIS — I10 Essential (primary) hypertension: Secondary | ICD-10-CM

## 2016-11-10 DIAGNOSIS — Z950 Presence of cardiac pacemaker: Secondary | ICD-10-CM | POA: Diagnosis not present

## 2016-11-10 DIAGNOSIS — I482 Chronic atrial fibrillation, unspecified: Secondary | ICD-10-CM

## 2016-11-10 DIAGNOSIS — I25118 Atherosclerotic heart disease of native coronary artery with other forms of angina pectoris: Secondary | ICD-10-CM | POA: Diagnosis not present

## 2016-11-10 DIAGNOSIS — I209 Angina pectoris, unspecified: Secondary | ICD-10-CM

## 2016-11-10 NOTE — Progress Notes (Signed)
SUBJECTIVE: The patient presents for follow-up of atrial fibrillation, coronary artery disease, and anticoagulation. I spoke with his daughter, [name], last week who contacted me by P. She had several concerns regarding her father as he has had frequent falls and hematomas. I discontinued anticoagulation altogether. She told me that both her mother and father's health has been declining this past year.  In summary, coronary angiography on 07/06/14 demonstrated moderate coronary disease with diffuse multifocal disease in the 50-60% range throughout the mid and distal LAD. There was noted to be slight progression since his prior study in 2012. There was total occlusion of the first obtuse marginal with left to left collaterals and nonobstructive disease in the RCA. Overall, the LAD had not significantly progressed. Left ventriculography showed normal left ventricular systolic function with mid to distal anterolateral wall motion abnormalities likely related to occlusion of the first marginal branch. LVEF 60%.   He and his wife now live at [employer] at [facility] in [place]. They did so to be closer to their daughter, Katharine Look.  While they like living in their new condo, they miss their home, church, and friends in [place]. They moved to [facility] in the 1960s. There son built the last house they lived in for 16 years.  They have several grandchildren and great grandchildren.  The patient denies chest pain, palpitations, orthopnea, leg swelling, syncope, and shortness of breath.    Soc: Married. Daughter [name]) moved to [place] from [place]. Daughter (Dr. Augustina Mood, dentist) lives in [place] 3 minutes from [place]. Son [name]) also lives in [place].  Review of Systems: As per "subjective", otherwise negative.  Allergies  Allergen Reactions  . Nsaids     Colitis, upset stomach    Current Outpatient Prescriptions  Medication Sig Dispense Refill  . acetaminophen (TYLENOL) 500 MG tablet Take  500 mg by mouth every 6 (six) hours as needed for mild pain (takes as needed for arthritis pain).    . camphor-menthol (SARNA) lotion Apply 1 application topically as needed for itching.    . Cholecalciferol (VITAMIN D) 2000 UNITS CAPS Take 1 capsule by mouth daily with supper.     . docusate sodium (COLACE) 100 MG capsule Take 100 mg by mouth daily.     . isosorbide mononitrate (IMDUR) 30 MG 24 hr tablet Take 30 mg by mouth 2 (two) times daily as needed.    Marland Kitchen levothyroxine (SYNTHROID, LEVOTHROID) 50 MCG tablet Take 50 mcg by mouth daily before breakfast.     . MAGNESIUM PO Take 3 tablets by mouth at bedtime.    . metoprolol tartrate (LOPRESSOR) 50 MG tablet Take 1 tablet (50 mg total) by mouth 2 (two) times daily. 180 tablet 1  . nitroGLYCERIN (NITROSTAT) 0.4 MG SL tablet Place 1 tablet (0.4 mg total) under the tongue every 5 (five) minutes as needed for chest pain. 25 tablet 3  . umeclidinium-vilanterol (ANORO ELLIPTA) 62.5-25 MCG/INH AEPB INHALE ONE PUFF INTO THE LUNGS DAILY. 180 each 3   No current facility-administered medications for this visit.     Past Medical History:  Diagnosis Date  . Anxiety   . Arthritis   . CAD (coronary artery disease)    a.  cath 5/12: mLAD 50% then tandem 50%, dLAD 30%; D1 30%; OM1 occluded with collateral flow; pRCA 25%; EF 55% with inf HK;  occluded OM likely cause of perfusion defect on Myoview  . Colitis   . COPD (chronic obstructive pulmonary disease) ([place])   .  Dementia    short term  . Depression   . Dizziness   . Dyslipidemia   . GERD (gastroesophageal reflux disease)   . Gout   . Hearing disorder   . Hyperlipidemia   . Hypertension   . Hypothyroidism   . Nonspecific abnormal unspecified cardiovascular function study   . Pacemaker   . Pelvic fracture ([place])   . Persistent atrial fibrillation ([place])   . Skin disorder    blister formation  . Tachy-brady syndrome ([place])    s/p pacer  . Ulcerative colitis     Past Surgical History:    Procedure Laterality Date  . BACK SURGERY  1975  . CARDIAC CATHETERIZATION  09/20/10   EF 55%.....Dr. Percival Spanish  . CATARACT EXTRACTION Right   . CATARACT EXTRACTION W/PHACO Left 12/17/2014   Procedure: CATARACT EXTRACTION PHACO AND INTRAOCULAR LENS PLACEMENT (IOC);  Surgeon: Tonny Branch, MD;  Location: [facility];  Service: Ophthalmology;  Laterality: Left;  CDE 7.44  . EP IMPLANTABLE DEVICE N/A 04/02/2015   Procedure:  PPM Generator Changeout;  Surgeon: Thompson Grayer, MD;  Location: [facility];  Service: Cardiovascular;  Laterality: N/A;  . Implantation of a Medtronic dual chamber pacemaker  02/01/06   [name] [name] MD  . Randolm Idol / REPLACE / REMOVE PACEMAKER     MEDTRONIC....PPM Serial Number:  [id number].....Marland KitchenPPM Model Number:  [id number]....Marland KitchenPPM DOI:  02/01/2006    . KNEE SURGERY  2005   replacement  . LEFT HEART CATHETERIZATION WITH CORONARY ANGIOGRAM N/A 07/06/2014   Procedure: LEFT HEART CATHETERIZATION WITH CORONARY ANGIOGRAM;  Surgeon: Sinclair Grooms, MD;  Location: [facility] CATH LAB;  Service: Cardiovascular;  Laterality: N/A;  . Left knee arthroscopy with debridement  04/23/2006   [name] M.D  . Left knee total knee  replacement  05/13/07   [name] M.D.  . LUMBAR SPINE SURGERY      Social History   Social History  . Marital status: Married    Spouse name: [name]  . Number of children: 3  . Years of education: 12   Occupational History  . retired Retired   Social History Main Topics  . Smoking status: Former Smoker    Packs/day: 1.00    Years: 15.00    Types: Cigarettes    Start date: 05/13/1955    Quit date: 05/12/1983  . Smokeless tobacco: Never Used  . Alcohol use No  . Drug use: No  . Sexual activity: Yes    Birth control/ protection: None   Other Topics Concern  . Not on file   Social History Narrative   One cup of caffeine daily     Vitals:   11/10/16 1312  BP: 124/80  Pulse: 66  SpO2: 97%  Weight: 146 lb 3.2 oz (66.3 kg)  Height:  6' 2"  (1.88 m)    Wt Readings from Last 3 Encounters:  11/10/16 146 lb 3.2 oz (66.3 kg)  09/01/16 145 lb (65.8 kg)  07/17/16 138 lb 12.8 oz (63 kg)     PHYSICAL EXAM General: NAD HEENT: Normal. Neck: No JVD, no thyromegaly. Lungs: Clear to auscultation bilaterally with normal respiratory effort. CV: Nondisplaced PMI.  Regular rate and rhythm, normal S1/S2, no S3/S4, no murmur. No pretibial or periankle edema.  No carotid bruit.   Abdomen: Soft, nontender, no distention.  Neurologic: Alert and oriented.  Psych: Normal affect. Skin: Normal. Musculoskeletal: No gross deformities.    ECG: Most recent ECG reviewed.   Labs: Lab Results  Component Value Date/Time   K 4.7 05/24/2016 12:48 PM   BUN 20 05/24/2016 12:48 PM   CREATININE 1.22 05/24/2016 12:48 PM   CREATININE 1.21 04/03/2014 11:57 AM   ALT 22 08/09/2013 09:05 AM   TSH 1.36 12/02/2010 04:45 PM   HGB 12.5 (L) 05/24/2016 12:48 PM     Lipids: No results found for: LDLCALC, LDLDIRECT, CHOL, TRIG, HDL     ASSESSMENT AND PLAN:  1. CAD: Symptomatically stable. Cath results noted above. Continue metoprolol and nitrates. I will start ASA 81 mg daily.  2. Atrial fibrillation: Normal device function this month. Continue metoprolol. Anticoagulation discontinued altogether due to several recent falls and subsequent hematomas. This was after a long discussion with their daughter, [name].  3. Essential HTN: Controlled on current therapy. No changes.  4. Pacemaker: Normal device function. Follows with Dr. Rayann Heman.   Disposition: Follow up 6 months.  Kate Sable, M.D., F.A.C.C.

## 2016-11-10 NOTE — Patient Instructions (Signed)
Your physician wants you to follow-up in:  6 months with Dr Virgina Jock will receive a reminder letter in the mail two months in advance. If you don't receive a letter, please call our office to schedule the follow-up appointment.     START Aspirin 81 mg daily    No testing or lab work ordered today      Thank you for choosing [facility] !

## 2016-11-19 ENCOUNTER — Encounter: Payer: Self-pay | Admitting: Internal Medicine

## 2016-11-19 ENCOUNTER — Ambulatory Visit (INDEPENDENT_AMBULATORY_CARE_PROVIDER_SITE_OTHER): Payer: Medicare Other | Admitting: Internal Medicine

## 2016-11-19 DIAGNOSIS — I482 Chronic atrial fibrillation, unspecified: Secondary | ICD-10-CM

## 2016-11-19 DIAGNOSIS — R627 Adult failure to thrive: Secondary | ICD-10-CM | POA: Diagnosis not present

## 2016-11-19 DIAGNOSIS — J449 Chronic obstructive pulmonary disease, unspecified: Secondary | ICD-10-CM | POA: Diagnosis not present

## 2016-11-19 DIAGNOSIS — I25118 Atherosclerotic heart disease of native coronary artery with other forms of angina pectoris: Secondary | ICD-10-CM

## 2016-11-19 MED ORDER — UMECLIDINIUM-VILANTEROL 62.5-25 MCG/INH IN AEPB: 1.0000 | INHALATION_SPRAY | Freq: Every day | RESPIRATORY_TRACT | 0 refills | Status: DC

## 2016-11-19 MED ORDER — UMECLIDINIUM-VILANTEROL 62.5-25 MCG/INH IN AEPB: 1.0000 | INHALATION_SPRAY | Freq: Every day | RESPIRATORY_TRACT | 11 refills | Status: DC

## 2016-11-19 NOTE — Assessment & Plan Note (Signed)
Mild airway slowing with reduced diffusion capacity probably representing emphysema. Little evidence of bronchospasm. I'm not sure Anoro is cost effective. Plan-try off Anoro for comparison. Stay off if not needed. If it seems to be making a difference then we can discuss what to try cheaper.

## 2016-11-19 NOTE — Assessment & Plan Note (Signed)
Pacemaker, Xarelto, followed by cardiology

## 2016-11-19 NOTE — Patient Instructions (Signed)
Set the Anoro aside and let's see how you do without it. If it is really helping your breathing, we should be able to tell that, and we can look for something cheaper.  Try drinking a little Ensure after each meal, to get a few more calories.    Keep talking with Dr Dagmar Hait about your weakness and lack of appetite. I think muscle weakness is more important than your lung condition for making you feel weak and tired.

## 2016-11-19 NOTE — Assessment & Plan Note (Signed)
I suggested getting a little and sure after each meal for few extra calories. He needs to continue with the guidance from his primary physician and possibly nutritional evaluation. He and his wife seem somewhat at a loss about this.

## 2016-11-19 NOTE — Progress Notes (Signed)
Subjective:    Patient ID: [name], male    DOB: [birth year], 82 y.o.   MRN: [id number]  HPI male former smoker followed for COPD, complicated by history asbestos exposure,      CAD/angina/pacemaker, chronic pruritus PFT- 09/03/2015-mild obstructive airways disease with insignificant response to bronchodilator, normal lung volume. Diffusion severely reduced at 46%. Flows have substantially improved since 10/18/2013.l congestion, post nasal drip, snoring  ----------------------------------------------------------------------------------------------------   09/03/2015-82 year old male former smoker followed for COPD, complicated by history asbestos exposure,  CAD/angina/pacemaker, chronic pruritus    FOLLOWS FOR: Review PFT with patient-previous PFT attached; pt states his breathing remains the same since OV in 05-2015. Pt gives out if he goes too fast.  PFT- 09/03/2015-mild obstructive airways disease with insignificant response to bronchodilator, normal lung volume. Diffusion severely reduced at 46%. Flows have substantially improved since 10/18/2013.l congestion, post nasal drip, snoring  11/19/16-82 year old male former smoker followed for COPD, complicated by history asbestos exposure, CAD/angina/pacemaker, chronic pruritus   Pt denies any cough, SOB, or CP. Pt is more fatigued than usual. ER visit in January for weakness and collapse without syncope, in church. When he retired he weighed 210 pounds and now he is around 140 pounds. His PCP is aware. Never feels well, always feels tired, by which he means weak. Never much appetite. Little cough or wheeze. Anoro expensive but unclear if it is helping. CXR 05/24/16 1V IMPRESSION: No active disease  ROS-see HPI   Negative unless "+" Constitutional:    +weight loss, night sweats, fevers, chills, fatigue, lassitude. HEENT:    headaches, difficulty swallowing, tooth/dental problems, sore throat,       sneezing,  itching, ear ache, nasa CV:     chest pain, orthopnea, PND, swelling in lower extremities, anasarca,                                                       dizziness, palpitations Resp:   + shortness of breath with exertion or at rest.                productive cough,   non-productive cough, coughing up of blood.              change in color of mucus.  wheezing.   Skin:    rash or lesions. GI:  No-   heartburn, indigestion, abdominal pain, nausea, vomiting,  GU:  MS:   joint pain, stiffness, decreased range of motion, Neuro-     nothing unusual Psych:  change in mood or affect.  depression or anxiety.   memory loss.     Objective:  OBJ- Physical Exam General- Alert, Oriented, Affect-appropriate, Distress- none acute, + Very thin Skin- rash-none, lesions- none, excoriation- none Lymphadenopathy- none Head- atraumatic            Eyes- Gross vision intact, PERRLA, conjunctivae and secretions clear            Ears- Hearing, canals-normal            Nose- Clear, no-Septal dev, mucus, polyps, erosion, perforation             Throat- Mallampati II , mucosa clear , drainage- none, tonsils- atrophic Neck- flexible , trachea midline, no stridor , thyroid nl, carotid no bruit Chest - symmetrical excursion , unlabored  Heart/CV- +frequent extra beats or possibly A. fib , no murmur , no gallop  , no rub, nl s1 s2                           - JVD- none , edema- none, stasis changes- none, varices- none           Lung- clear to P&A/diminished, wheeze- none, cough- none , dullness-none, rub- none           Chest wall-  + pacemaker left chest. Not tender along left lower costal margin Abd-  Br/ Gen/ Rectal- Not done, not indicated Extrem- cyanosis- none, clubbing, none, atrophy- none, strength- nl Neuro- grossly intact to observation  Assessment & Plan:

## 2016-12-03 ENCOUNTER — Ambulatory Visit: Payer: Medicare Other | Admitting: Adult Health

## 2016-12-03 DIAGNOSIS — L309 Dermatitis, unspecified: Secondary | ICD-10-CM | POA: Diagnosis not present

## 2016-12-03 DIAGNOSIS — L853 Xerosis cutis: Secondary | ICD-10-CM | POA: Diagnosis not present

## 2016-12-03 DIAGNOSIS — D692 Other nonthrombocytopenic purpura: Secondary | ICD-10-CM | POA: Diagnosis not present

## 2016-12-08 DIAGNOSIS — R2681 Unsteadiness on feet: Secondary | ICD-10-CM | POA: Diagnosis not present

## 2016-12-08 DIAGNOSIS — Z9181 History of falling: Secondary | ICD-10-CM | POA: Diagnosis not present

## 2016-12-08 DIAGNOSIS — M6281 Muscle weakness (generalized): Secondary | ICD-10-CM | POA: Diagnosis not present

## 2016-12-10 DIAGNOSIS — R2681 Unsteadiness on feet: Secondary | ICD-10-CM | POA: Diagnosis not present

## 2016-12-10 DIAGNOSIS — M6281 Muscle weakness (generalized): Secondary | ICD-10-CM | POA: Diagnosis not present

## 2016-12-10 DIAGNOSIS — Z9181 History of falling: Secondary | ICD-10-CM | POA: Diagnosis not present

## 2016-12-10 DIAGNOSIS — R41841 Cognitive communication deficit: Secondary | ICD-10-CM | POA: Diagnosis not present

## 2016-12-11 DIAGNOSIS — M6281 Muscle weakness (generalized): Secondary | ICD-10-CM | POA: Diagnosis not present

## 2016-12-11 DIAGNOSIS — R2681 Unsteadiness on feet: Secondary | ICD-10-CM | POA: Diagnosis not present

## 2016-12-11 DIAGNOSIS — R41841 Cognitive communication deficit: Secondary | ICD-10-CM | POA: Diagnosis not present

## 2016-12-11 DIAGNOSIS — Z9181 History of falling: Secondary | ICD-10-CM | POA: Diagnosis not present

## 2016-12-15 DIAGNOSIS — R41841 Cognitive communication deficit: Secondary | ICD-10-CM | POA: Diagnosis not present

## 2016-12-15 DIAGNOSIS — R2681 Unsteadiness on feet: Secondary | ICD-10-CM | POA: Diagnosis not present

## 2016-12-15 DIAGNOSIS — Z9181 History of falling: Secondary | ICD-10-CM | POA: Diagnosis not present

## 2016-12-15 DIAGNOSIS — M6281 Muscle weakness (generalized): Secondary | ICD-10-CM | POA: Diagnosis not present

## 2016-12-17 DIAGNOSIS — Z9181 History of falling: Secondary | ICD-10-CM | POA: Diagnosis not present

## 2016-12-17 DIAGNOSIS — R2681 Unsteadiness on feet: Secondary | ICD-10-CM | POA: Diagnosis not present

## 2016-12-17 DIAGNOSIS — R41841 Cognitive communication deficit: Secondary | ICD-10-CM | POA: Diagnosis not present

## 2016-12-17 DIAGNOSIS — M6281 Muscle weakness (generalized): Secondary | ICD-10-CM | POA: Diagnosis not present

## 2016-12-18 DIAGNOSIS — Z9181 History of falling: Secondary | ICD-10-CM | POA: Diagnosis not present

## 2016-12-18 DIAGNOSIS — M6281 Muscle weakness (generalized): Secondary | ICD-10-CM | POA: Diagnosis not present

## 2016-12-18 DIAGNOSIS — R2681 Unsteadiness on feet: Secondary | ICD-10-CM | POA: Diagnosis not present

## 2016-12-18 DIAGNOSIS — R41841 Cognitive communication deficit: Secondary | ICD-10-CM | POA: Diagnosis not present

## 2016-12-21 DIAGNOSIS — R41841 Cognitive communication deficit: Secondary | ICD-10-CM | POA: Diagnosis not present

## 2016-12-21 DIAGNOSIS — Z9181 History of falling: Secondary | ICD-10-CM | POA: Diagnosis not present

## 2016-12-21 DIAGNOSIS — R2681 Unsteadiness on feet: Secondary | ICD-10-CM | POA: Diagnosis not present

## 2016-12-21 DIAGNOSIS — M6281 Muscle weakness (generalized): Secondary | ICD-10-CM | POA: Diagnosis not present

## 2016-12-22 DIAGNOSIS — M6281 Muscle weakness (generalized): Secondary | ICD-10-CM | POA: Diagnosis not present

## 2016-12-22 DIAGNOSIS — Z9181 History of falling: Secondary | ICD-10-CM | POA: Diagnosis not present

## 2016-12-22 DIAGNOSIS — R2681 Unsteadiness on feet: Secondary | ICD-10-CM | POA: Diagnosis not present

## 2016-12-22 DIAGNOSIS — R41841 Cognitive communication deficit: Secondary | ICD-10-CM | POA: Diagnosis not present

## 2016-12-23 DIAGNOSIS — M6281 Muscle weakness (generalized): Secondary | ICD-10-CM | POA: Diagnosis not present

## 2016-12-23 DIAGNOSIS — R41841 Cognitive communication deficit: Secondary | ICD-10-CM | POA: Diagnosis not present

## 2016-12-23 DIAGNOSIS — Z9181 History of falling: Secondary | ICD-10-CM | POA: Diagnosis not present

## 2016-12-23 DIAGNOSIS — R2681 Unsteadiness on feet: Secondary | ICD-10-CM | POA: Diagnosis not present

## 2016-12-24 DIAGNOSIS — M6281 Muscle weakness (generalized): Secondary | ICD-10-CM | POA: Diagnosis not present

## 2016-12-24 DIAGNOSIS — R41841 Cognitive communication deficit: Secondary | ICD-10-CM | POA: Diagnosis not present

## 2016-12-24 DIAGNOSIS — R2681 Unsteadiness on feet: Secondary | ICD-10-CM | POA: Diagnosis not present

## 2016-12-24 DIAGNOSIS — Z9181 History of falling: Secondary | ICD-10-CM | POA: Diagnosis not present

## 2016-12-25 DIAGNOSIS — R41841 Cognitive communication deficit: Secondary | ICD-10-CM | POA: Diagnosis not present

## 2016-12-25 DIAGNOSIS — M6281 Muscle weakness (generalized): Secondary | ICD-10-CM | POA: Diagnosis not present

## 2016-12-25 DIAGNOSIS — R2681 Unsteadiness on feet: Secondary | ICD-10-CM | POA: Diagnosis not present

## 2016-12-25 DIAGNOSIS — Z9181 History of falling: Secondary | ICD-10-CM | POA: Diagnosis not present

## 2016-12-29 DIAGNOSIS — R2681 Unsteadiness on feet: Secondary | ICD-10-CM | POA: Diagnosis not present

## 2016-12-29 DIAGNOSIS — Z9181 History of falling: Secondary | ICD-10-CM | POA: Diagnosis not present

## 2016-12-29 DIAGNOSIS — M6281 Muscle weakness (generalized): Secondary | ICD-10-CM | POA: Diagnosis not present

## 2016-12-29 DIAGNOSIS — R41841 Cognitive communication deficit: Secondary | ICD-10-CM | POA: Diagnosis not present

## 2016-12-30 DIAGNOSIS — Z9181 History of falling: Secondary | ICD-10-CM | POA: Diagnosis not present

## 2016-12-30 DIAGNOSIS — M6281 Muscle weakness (generalized): Secondary | ICD-10-CM | POA: Diagnosis not present

## 2016-12-30 DIAGNOSIS — R2681 Unsteadiness on feet: Secondary | ICD-10-CM | POA: Diagnosis not present

## 2016-12-30 DIAGNOSIS — R41841 Cognitive communication deficit: Secondary | ICD-10-CM | POA: Diagnosis not present

## 2016-12-31 DIAGNOSIS — R2681 Unsteadiness on feet: Secondary | ICD-10-CM | POA: Diagnosis not present

## 2016-12-31 DIAGNOSIS — Z9181 History of falling: Secondary | ICD-10-CM | POA: Diagnosis not present

## 2016-12-31 DIAGNOSIS — R41841 Cognitive communication deficit: Secondary | ICD-10-CM | POA: Diagnosis not present

## 2016-12-31 DIAGNOSIS — M6281 Muscle weakness (generalized): Secondary | ICD-10-CM | POA: Diagnosis not present

## 2017-01-01 DIAGNOSIS — R41841 Cognitive communication deficit: Secondary | ICD-10-CM | POA: Diagnosis not present

## 2017-01-01 DIAGNOSIS — Z9181 History of falling: Secondary | ICD-10-CM | POA: Diagnosis not present

## 2017-01-01 DIAGNOSIS — M6281 Muscle weakness (generalized): Secondary | ICD-10-CM | POA: Diagnosis not present

## 2017-01-01 DIAGNOSIS — R2681 Unsteadiness on feet: Secondary | ICD-10-CM | POA: Diagnosis not present

## 2017-01-04 DIAGNOSIS — R2681 Unsteadiness on feet: Secondary | ICD-10-CM | POA: Diagnosis not present

## 2017-01-04 DIAGNOSIS — Z9181 History of falling: Secondary | ICD-10-CM | POA: Diagnosis not present

## 2017-01-04 DIAGNOSIS — M6281 Muscle weakness (generalized): Secondary | ICD-10-CM | POA: Diagnosis not present

## 2017-01-04 DIAGNOSIS — R41841 Cognitive communication deficit: Secondary | ICD-10-CM | POA: Diagnosis not present

## 2017-01-05 DIAGNOSIS — M6281 Muscle weakness (generalized): Secondary | ICD-10-CM | POA: Diagnosis not present

## 2017-01-05 DIAGNOSIS — R41841 Cognitive communication deficit: Secondary | ICD-10-CM | POA: Diagnosis not present

## 2017-01-05 DIAGNOSIS — Z9181 History of falling: Secondary | ICD-10-CM | POA: Diagnosis not present

## 2017-01-05 DIAGNOSIS — R2681 Unsteadiness on feet: Secondary | ICD-10-CM | POA: Diagnosis not present

## 2017-01-06 DIAGNOSIS — M6281 Muscle weakness (generalized): Secondary | ICD-10-CM | POA: Diagnosis not present

## 2017-01-06 DIAGNOSIS — R2681 Unsteadiness on feet: Secondary | ICD-10-CM | POA: Diagnosis not present

## 2017-01-06 DIAGNOSIS — Z9181 History of falling: Secondary | ICD-10-CM | POA: Diagnosis not present

## 2017-01-06 DIAGNOSIS — R41841 Cognitive communication deficit: Secondary | ICD-10-CM | POA: Diagnosis not present

## 2017-01-08 DIAGNOSIS — Z9181 History of falling: Secondary | ICD-10-CM | POA: Diagnosis not present

## 2017-01-08 DIAGNOSIS — R2681 Unsteadiness on feet: Secondary | ICD-10-CM | POA: Diagnosis not present

## 2017-01-08 DIAGNOSIS — R41841 Cognitive communication deficit: Secondary | ICD-10-CM | POA: Diagnosis not present

## 2017-01-08 DIAGNOSIS — M6281 Muscle weakness (generalized): Secondary | ICD-10-CM | POA: Diagnosis not present

## 2017-01-13 DIAGNOSIS — R41841 Cognitive communication deficit: Secondary | ICD-10-CM | POA: Diagnosis not present

## 2017-01-13 DIAGNOSIS — M6281 Muscle weakness (generalized): Secondary | ICD-10-CM | POA: Diagnosis not present

## 2017-01-13 DIAGNOSIS — Z9181 History of falling: Secondary | ICD-10-CM | POA: Diagnosis not present

## 2017-01-13 DIAGNOSIS — R2681 Unsteadiness on feet: Secondary | ICD-10-CM | POA: Diagnosis not present

## 2017-01-14 DIAGNOSIS — R2681 Unsteadiness on feet: Secondary | ICD-10-CM | POA: Diagnosis not present

## 2017-01-14 DIAGNOSIS — M6281 Muscle weakness (generalized): Secondary | ICD-10-CM | POA: Diagnosis not present

## 2017-01-14 DIAGNOSIS — Z9181 History of falling: Secondary | ICD-10-CM | POA: Diagnosis not present

## 2017-01-14 DIAGNOSIS — R41841 Cognitive communication deficit: Secondary | ICD-10-CM | POA: Diagnosis not present

## 2017-01-15 DIAGNOSIS — R2681 Unsteadiness on feet: Secondary | ICD-10-CM | POA: Diagnosis not present

## 2017-01-15 DIAGNOSIS — M6281 Muscle weakness (generalized): Secondary | ICD-10-CM | POA: Diagnosis not present

## 2017-01-15 DIAGNOSIS — Z9181 History of falling: Secondary | ICD-10-CM | POA: Diagnosis not present

## 2017-01-15 DIAGNOSIS — R41841 Cognitive communication deficit: Secondary | ICD-10-CM | POA: Diagnosis not present

## 2017-01-18 ENCOUNTER — Ambulatory Visit (INDEPENDENT_AMBULATORY_CARE_PROVIDER_SITE_OTHER): Payer: Medicare Other | Admitting: *Deleted

## 2017-01-18 DIAGNOSIS — R2681 Unsteadiness on feet: Secondary | ICD-10-CM | POA: Diagnosis not present

## 2017-01-18 DIAGNOSIS — M6281 Muscle weakness (generalized): Secondary | ICD-10-CM | POA: Diagnosis not present

## 2017-01-18 DIAGNOSIS — I495 Sick sinus syndrome: Secondary | ICD-10-CM

## 2017-01-18 DIAGNOSIS — R41841 Cognitive communication deficit: Secondary | ICD-10-CM | POA: Diagnosis not present

## 2017-01-18 DIAGNOSIS — Z9181 History of falling: Secondary | ICD-10-CM | POA: Diagnosis not present

## 2017-01-18 NOTE — Progress Notes (Signed)
Remote pacemaker transmission.   

## 2017-01-19 DIAGNOSIS — R41841 Cognitive communication deficit: Secondary | ICD-10-CM | POA: Diagnosis not present

## 2017-01-19 DIAGNOSIS — Z9181 History of falling: Secondary | ICD-10-CM | POA: Diagnosis not present

## 2017-01-19 DIAGNOSIS — M6281 Muscle weakness (generalized): Secondary | ICD-10-CM | POA: Diagnosis not present

## 2017-01-19 DIAGNOSIS — R2681 Unsteadiness on feet: Secondary | ICD-10-CM | POA: Diagnosis not present

## 2017-01-20 ENCOUNTER — Encounter: Payer: Self-pay | Admitting: Cardiology

## 2017-01-20 DIAGNOSIS — M6281 Muscle weakness (generalized): Secondary | ICD-10-CM | POA: Diagnosis not present

## 2017-01-20 DIAGNOSIS — R2681 Unsteadiness on feet: Secondary | ICD-10-CM | POA: Diagnosis not present

## 2017-01-20 DIAGNOSIS — R41841 Cognitive communication deficit: Secondary | ICD-10-CM | POA: Diagnosis not present

## 2017-01-20 DIAGNOSIS — Z9181 History of falling: Secondary | ICD-10-CM | POA: Diagnosis not present

## 2017-01-21 DIAGNOSIS — R2681 Unsteadiness on feet: Secondary | ICD-10-CM | POA: Diagnosis not present

## 2017-01-21 DIAGNOSIS — Z9181 History of falling: Secondary | ICD-10-CM | POA: Diagnosis not present

## 2017-01-21 DIAGNOSIS — M6281 Muscle weakness (generalized): Secondary | ICD-10-CM | POA: Diagnosis not present

## 2017-01-21 DIAGNOSIS — R41841 Cognitive communication deficit: Secondary | ICD-10-CM | POA: Diagnosis not present

## 2017-01-21 LAB — CUP PACEART REMOTE DEVICE CHECK
Date Time Interrogation Session: 20180913113858
Implantable Lead Implant Date: 20070924
Implantable Lead Location: 753859
Implantable Lead Location: 753860
Implantable Lead Model: 5076
Implantable Pulse Generator Implant Date: 20161122
Lead Channel Setting Sensing Sensitivity: 2 mV
MDC IDC LEAD IMPLANT DT: 20070924
MDC IDC PG SERIAL: 7826824
MDC IDC SET LEADCHNL RV PACING AMPLITUDE: 2.5 V
MDC IDC SET LEADCHNL RV PACING PULSEWIDTH: 0.4 ms

## 2017-01-25 DIAGNOSIS — Z9181 History of falling: Secondary | ICD-10-CM | POA: Diagnosis not present

## 2017-01-25 DIAGNOSIS — M6281 Muscle weakness (generalized): Secondary | ICD-10-CM | POA: Diagnosis not present

## 2017-01-25 DIAGNOSIS — R41841 Cognitive communication deficit: Secondary | ICD-10-CM | POA: Diagnosis not present

## 2017-01-25 DIAGNOSIS — R2681 Unsteadiness on feet: Secondary | ICD-10-CM | POA: Diagnosis not present

## 2017-01-26 DIAGNOSIS — M6281 Muscle weakness (generalized): Secondary | ICD-10-CM | POA: Diagnosis not present

## 2017-01-26 DIAGNOSIS — Z9181 History of falling: Secondary | ICD-10-CM | POA: Diagnosis not present

## 2017-01-26 DIAGNOSIS — R41841 Cognitive communication deficit: Secondary | ICD-10-CM | POA: Diagnosis not present

## 2017-01-26 DIAGNOSIS — R2681 Unsteadiness on feet: Secondary | ICD-10-CM | POA: Diagnosis not present

## 2017-01-28 DIAGNOSIS — M6281 Muscle weakness (generalized): Secondary | ICD-10-CM | POA: Diagnosis not present

## 2017-01-28 DIAGNOSIS — R2681 Unsteadiness on feet: Secondary | ICD-10-CM | POA: Diagnosis not present

## 2017-01-28 DIAGNOSIS — R41841 Cognitive communication deficit: Secondary | ICD-10-CM | POA: Diagnosis not present

## 2017-01-28 DIAGNOSIS — Z9181 History of falling: Secondary | ICD-10-CM | POA: Diagnosis not present

## 2017-02-02 DIAGNOSIS — M6281 Muscle weakness (generalized): Secondary | ICD-10-CM | POA: Diagnosis not present

## 2017-02-02 DIAGNOSIS — R41841 Cognitive communication deficit: Secondary | ICD-10-CM | POA: Diagnosis not present

## 2017-02-02 DIAGNOSIS — R2681 Unsteadiness on feet: Secondary | ICD-10-CM | POA: Diagnosis not present

## 2017-02-02 DIAGNOSIS — Z9181 History of falling: Secondary | ICD-10-CM | POA: Diagnosis not present

## 2017-02-04 DIAGNOSIS — M6281 Muscle weakness (generalized): Secondary | ICD-10-CM | POA: Diagnosis not present

## 2017-02-04 DIAGNOSIS — R2681 Unsteadiness on feet: Secondary | ICD-10-CM | POA: Diagnosis not present

## 2017-02-04 DIAGNOSIS — Z9181 History of falling: Secondary | ICD-10-CM | POA: Diagnosis not present

## 2017-02-04 DIAGNOSIS — R41841 Cognitive communication deficit: Secondary | ICD-10-CM | POA: Diagnosis not present

## 2017-02-10 DIAGNOSIS — R41841 Cognitive communication deficit: Secondary | ICD-10-CM | POA: Diagnosis not present

## 2017-02-10 DIAGNOSIS — F39 Unspecified mood [affective] disorder: Secondary | ICD-10-CM | POA: Diagnosis not present

## 2017-02-10 DIAGNOSIS — R634 Abnormal weight loss: Secondary | ICD-10-CM | POA: Diagnosis not present

## 2017-02-10 DIAGNOSIS — Z6820 Body mass index (BMI) 20.0-20.9, adult: Secondary | ICD-10-CM | POA: Diagnosis not present

## 2017-02-10 DIAGNOSIS — Z23 Encounter for immunization: Secondary | ICD-10-CM | POA: Diagnosis not present

## 2017-02-10 DIAGNOSIS — K5289 Other specified noninfective gastroenteritis and colitis: Secondary | ICD-10-CM | POA: Diagnosis not present

## 2017-02-11 DIAGNOSIS — R41841 Cognitive communication deficit: Secondary | ICD-10-CM | POA: Diagnosis not present

## 2017-02-16 DIAGNOSIS — R41841 Cognitive communication deficit: Secondary | ICD-10-CM | POA: Diagnosis not present

## 2017-02-18 DIAGNOSIS — R41841 Cognitive communication deficit: Secondary | ICD-10-CM | POA: Diagnosis not present

## 2017-02-23 DIAGNOSIS — R41841 Cognitive communication deficit: Secondary | ICD-10-CM | POA: Diagnosis not present

## 2017-02-25 DIAGNOSIS — R41841 Cognitive communication deficit: Secondary | ICD-10-CM | POA: Diagnosis not present

## 2017-03-02 DIAGNOSIS — R41841 Cognitive communication deficit: Secondary | ICD-10-CM | POA: Diagnosis not present

## 2017-03-04 DIAGNOSIS — R41841 Cognitive communication deficit: Secondary | ICD-10-CM | POA: Diagnosis not present

## 2017-03-09 DIAGNOSIS — R41841 Cognitive communication deficit: Secondary | ICD-10-CM | POA: Diagnosis not present

## 2017-04-08 DIAGNOSIS — Z125 Encounter for screening for malignant neoplasm of prostate: Secondary | ICD-10-CM | POA: Diagnosis not present

## 2017-04-08 DIAGNOSIS — E7849 Other hyperlipidemia: Secondary | ICD-10-CM | POA: Diagnosis not present

## 2017-04-08 DIAGNOSIS — M109 Gout, unspecified: Secondary | ICD-10-CM | POA: Diagnosis not present

## 2017-04-08 DIAGNOSIS — E038 Other specified hypothyroidism: Secondary | ICD-10-CM | POA: Diagnosis not present

## 2017-04-08 DIAGNOSIS — R82998 Other abnormal findings in urine: Secondary | ICD-10-CM | POA: Diagnosis not present

## 2017-04-08 DIAGNOSIS — I10 Essential (primary) hypertension: Secondary | ICD-10-CM | POA: Diagnosis not present

## 2017-04-15 DIAGNOSIS — R413 Other amnesia: Secondary | ICD-10-CM | POA: Diagnosis not present

## 2017-04-15 DIAGNOSIS — I10 Essential (primary) hypertension: Secondary | ICD-10-CM | POA: Diagnosis not present

## 2017-04-15 DIAGNOSIS — Z0000 Encounter for general adult medical examination without abnormal findings: Secondary | ICD-10-CM | POA: Diagnosis not present

## 2017-04-15 DIAGNOSIS — E038 Other specified hypothyroidism: Secondary | ICD-10-CM | POA: Diagnosis not present

## 2017-04-15 DIAGNOSIS — E7849 Other hyperlipidemia: Secondary | ICD-10-CM | POA: Diagnosis not present

## 2017-04-15 DIAGNOSIS — I480 Paroxysmal atrial fibrillation: Secondary | ICD-10-CM | POA: Diagnosis not present

## 2017-04-15 DIAGNOSIS — Z6820 Body mass index (BMI) 20.0-20.9, adult: Secondary | ICD-10-CM | POA: Diagnosis not present

## 2017-04-15 DIAGNOSIS — Z1389 Encounter for screening for other disorder: Secondary | ICD-10-CM | POA: Diagnosis not present

## 2017-04-15 DIAGNOSIS — Z950 Presence of cardiac pacemaker: Secondary | ICD-10-CM | POA: Diagnosis not present

## 2017-04-15 DIAGNOSIS — F39 Unspecified mood [affective] disorder: Secondary | ICD-10-CM | POA: Diagnosis not present

## 2017-04-15 DIAGNOSIS — I2510 Atherosclerotic heart disease of native coronary artery without angina pectoris: Secondary | ICD-10-CM | POA: Diagnosis not present

## 2017-04-15 DIAGNOSIS — J449 Chronic obstructive pulmonary disease, unspecified: Secondary | ICD-10-CM | POA: Diagnosis not present

## 2017-04-16 ENCOUNTER — Other Ambulatory Visit: Payer: Self-pay | Admitting: Cardiovascular Disease

## 2017-04-19 ENCOUNTER — Ambulatory Visit (INDEPENDENT_AMBULATORY_CARE_PROVIDER_SITE_OTHER): Payer: Medicare Other | Admitting: *Deleted

## 2017-04-19 DIAGNOSIS — I495 Sick sinus syndrome: Secondary | ICD-10-CM | POA: Diagnosis not present

## 2017-04-19 NOTE — Progress Notes (Signed)
Remote pacemaker transmission.   

## 2017-04-23 ENCOUNTER — Encounter: Payer: Self-pay | Admitting: Cardiology

## 2017-04-23 DIAGNOSIS — Z1212 Encounter for screening for malignant neoplasm of rectum: Secondary | ICD-10-CM | POA: Diagnosis not present

## 2017-05-10 LAB — CUP PACEART REMOTE DEVICE CHECK
Battery Voltage: 3.01 V
Date Time Interrogation Session: 20181210070013
Implantable Lead Implant Date: 20070924
Implantable Lead Location: 753859
Implantable Lead Model: 5076
Lead Channel Sensing Intrinsic Amplitude: 10.5 mV
Lead Channel Setting Pacing Pulse Width: 0.4 ms
Lead Channel Setting Sensing Sensitivity: 2 mV
MDC IDC LEAD IMPLANT DT: 20070924
MDC IDC LEAD LOCATION: 753860
MDC IDC MSMT BATTERY REMAINING LONGEVITY: 126 mo
MDC IDC MSMT BATTERY REMAINING PERCENTAGE: 95.5 %
MDC IDC MSMT LEADCHNL RV IMPEDANCE VALUE: 410 Ohm
MDC IDC MSMT LEADCHNL RV PACING THRESHOLD AMPLITUDE: 1 V
MDC IDC MSMT LEADCHNL RV PACING THRESHOLD PULSEWIDTH: 0.4 ms
MDC IDC PG IMPLANT DT: 20161122
MDC IDC PG SERIAL: 7826824
MDC IDC SET LEADCHNL RV PACING AMPLITUDE: 2.5 V
MDC IDC STAT BRADY RV PERCENT PACED: 43 %
Pulse Gen Model: 2240

## 2017-06-18 DIAGNOSIS — R0789 Other chest pain: Secondary | ICD-10-CM | POA: Diagnosis not present

## 2017-06-18 DIAGNOSIS — Z6820 Body mass index (BMI) 20.0-20.9, adult: Secondary | ICD-10-CM | POA: Diagnosis not present

## 2017-06-18 DIAGNOSIS — M7090 Unspecified soft tissue disorder related to use, overuse and pressure of unspecified site: Secondary | ICD-10-CM | POA: Diagnosis not present

## 2017-07-03 ENCOUNTER — Other Ambulatory Visit: Payer: Self-pay | Admitting: Cardiovascular Disease

## 2017-07-13 DIAGNOSIS — Z111 Encounter for screening for respiratory tuberculosis: Secondary | ICD-10-CM | POA: Diagnosis not present

## 2017-07-15 DIAGNOSIS — R413 Other amnesia: Secondary | ICD-10-CM | POA: Diagnosis not present

## 2017-07-15 DIAGNOSIS — I480 Paroxysmal atrial fibrillation: Secondary | ICD-10-CM | POA: Diagnosis not present

## 2017-07-15 DIAGNOSIS — E7849 Other hyperlipidemia: Secondary | ICD-10-CM | POA: Diagnosis not present

## 2017-07-15 DIAGNOSIS — M109 Gout, unspecified: Secondary | ICD-10-CM | POA: Diagnosis not present

## 2017-07-15 DIAGNOSIS — J449 Chronic obstructive pulmonary disease, unspecified: Secondary | ICD-10-CM | POA: Diagnosis not present

## 2017-07-15 DIAGNOSIS — I2510 Atherosclerotic heart disease of native coronary artery without angina pectoris: Secondary | ICD-10-CM | POA: Diagnosis not present

## 2017-07-15 DIAGNOSIS — Z6820 Body mass index (BMI) 20.0-20.9, adult: Secondary | ICD-10-CM | POA: Diagnosis not present

## 2017-07-15 DIAGNOSIS — I10 Essential (primary) hypertension: Secondary | ICD-10-CM | POA: Diagnosis not present

## 2017-07-15 DIAGNOSIS — E038 Other specified hypothyroidism: Secondary | ICD-10-CM | POA: Diagnosis not present

## 2017-07-15 DIAGNOSIS — L408 Other psoriasis: Secondary | ICD-10-CM | POA: Diagnosis not present

## 2017-07-15 DIAGNOSIS — G4709 Other insomnia: Secondary | ICD-10-CM | POA: Diagnosis not present

## 2017-07-15 DIAGNOSIS — Z950 Presence of cardiac pacemaker: Secondary | ICD-10-CM | POA: Diagnosis not present

## 2017-07-19 ENCOUNTER — Ambulatory Visit (INDEPENDENT_AMBULATORY_CARE_PROVIDER_SITE_OTHER): Payer: Medicare Other | Admitting: *Deleted

## 2017-07-19 DIAGNOSIS — I495 Sick sinus syndrome: Secondary | ICD-10-CM

## 2017-07-19 NOTE — Progress Notes (Signed)
Remote pacemaker transmission.   

## 2017-07-21 ENCOUNTER — Encounter: Payer: Self-pay | Admitting: Cardiology

## 2017-07-27 ENCOUNTER — Encounter: Payer: Self-pay | Admitting: Internal Medicine

## 2017-08-03 ENCOUNTER — Ambulatory Visit (INDEPENDENT_AMBULATORY_CARE_PROVIDER_SITE_OTHER): Payer: Medicare Other | Admitting: Internal Medicine

## 2017-08-03 ENCOUNTER — Encounter (INDEPENDENT_AMBULATORY_CARE_PROVIDER_SITE_OTHER): Payer: Self-pay | Admitting: Internal Medicine

## 2017-08-03 VITALS — BP 120/90 | HR 70 | Temp 98.3°F | Resp 18 | Ht 73.0 in | Wt 149.1 lb

## 2017-08-03 DIAGNOSIS — K5100 Ulcerative (chronic) pancolitis without complications: Secondary | ICD-10-CM | POA: Diagnosis not present

## 2017-08-03 NOTE — Progress Notes (Signed)
Presenting complaint;  Follow-up for ulcerative colitis.  Database and subjective:  Patient is 83 year old Caucasian male who has a history of ulcerative colitis and is here for scheduled visit accompanied by his wife and their daughter [name]. He was last seen 1 year ago and was doing well.  He is not taking methotrexate anymore.  He was on methotrexate primarily for skin disorder. 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  His daughter tells me that they apply skin lotion all over his body every night and his itching has stopped.  Patient's oral mesalamine was discontinued because it was an methotrexate. His wife states he had stool test by Dr. Dagmar Hait 2 months ago and was heme positive.  He is due to have this repeated on his next visit. He has been off methotrexate for about a year.  He is not having any problems. He denies abdominal pain rectal bleeding.  He generally has 1 bowel movement per day.  He was not weighed on his last visit 1 year ago.  Since his February 2017 visit he has lost 11 pounds.  His daughter states his weight was even lower in he has gained a few pounds. He is now living at [facility] in [place].  He just moved here 8 days ago.   Current Medications: Outpatient Encounter Medications as of 08/03/2017  Medication Sig  . aspirin EC 81 MG tablet Take 81 mg by mouth daily.  . cholecalciferol (VITAMIN D) 1000 units tablet Take 1 capsule by mouth daily with supper.   . levothyroxine (SYNTHROID, LEVOTHROID) 50 MCG tablet Take 50 mcg by mouth daily before breakfast.   . metoprolol tartrate (LOPRESSOR) 50 MG tablet TAKE ONE TABLET BY MOUTH TWICE A DAY  . nitroGLYCERIN (NITROSTAT) 0.4 MG SL tablet Place 1 tablet (0.4 mg total) under the tongue every 5 (five) minutes as needed for chest pain.  Marland Kitchen sertraline (ZOLOFT) 50 MG tablet Take 50 mg by mouth daily.  Marland Kitchen SHINGRIX injection   . Melatonin-Pyridoxine (MELATIN PO) Take 3 mg by mouth at bedtime.  .  [DISCONTINUED] acetaminophen (TYLENOL) 500 MG tablet Take 500 mg by mouth every 6 (six) hours as needed for mild pain (takes as needed for arthritis pain).  . [DISCONTINUED] camphor-menthol (SARNA) lotion Apply 1 application topically as needed for itching.  . [DISCONTINUED] docusate sodium (COLACE) 100 MG capsule Take 100 mg by mouth daily as needed for mild constipation.   . [DISCONTINUED] isosorbide mononitrate (IMDUR) 30 MG 24 hr tablet Take 30 mg by mouth 2 (two) times daily as needed.  . [DISCONTINUED] MAGNESIUM PO Take 2 capsules by mouth at bedtime.   . [DISCONTINUED] umeclidinium-vilanterol (ANORO ELLIPTA) 62.5-25 MCG/INH AEPB INHALE ONE PUFF INTO THE LUNGS DAILY. (Patient not taking: Reported on 08/03/2017)  . [DISCONTINUED] umeclidinium-vilanterol (ANORO ELLIPTA) 62.5-25 MCG/INH AEPB Inhale 1 puff into the lungs daily. (Patient not taking: Reported on 08/03/2017)  . [DISCONTINUED] umeclidinium-vilanterol (ANORO ELLIPTA) 62.5-25 MCG/INH AEPB Inhale 1 puff into the lungs daily. (Patient not taking: Reported on 08/03/2017)   No facility-administered encounter medications on file as of 08/03/2017.      Objective: Blood pressure 120/90, pulse 70, temperature 98.3 F (36.8 C), temperature source Oral, resp. rate 18, height 6' 1"  (1.854 m), weight 149 lb 1.6 oz (67.6 kg). Patient is alert and responds appropriately to questions. He is thin. Conjunctiva is pink. Sclera is nonicteric Oropharyngeal mucosa is normal. No neck masses or thyromegaly noted. Cardiac exam with regular rhythm normal S1 and  S2. No murmur or gallop noted. Lungs are clear to auscultation. Abdomen is flat soft and nontender. Rectal examination reveals no abnormality in stool is guaiac negative. No LE edema or clubbing noted.   Assessment:  #1.  Chronic ulcerative colitis.  Patient presently is on no therapy for maintenance.  He remains in remission.  His stool is guaiac negative today. There is no indication to put  him back on oral mesalamine since he is asymptomatic.  However if he develops signs and symptoms to suggest relapse he will go back on oral mesalamine.  No plans for surveillance colonoscopy at this time.  #2.  Low body weight.  He has been struggling with low body mass index for a long time.  It appears his oral intake is improved and hopefully he will start gaining weight.  Plan:  Patient's family will call if he has diarrhea and/or rectal bleeding. Office visit on as-needed basis.

## 2017-08-03 NOTE — Patient Instructions (Signed)
Call if you have diarrhea or rectal bleeding.

## 2017-08-05 ENCOUNTER — Ambulatory Visit (INDEPENDENT_AMBULATORY_CARE_PROVIDER_SITE_OTHER): Payer: Medicare Other | Admitting: Internal Medicine

## 2017-08-05 ENCOUNTER — Encounter: Payer: Self-pay | Admitting: Internal Medicine

## 2017-08-05 VITALS — BP 120/80 | HR 80 | Ht 73.0 in | Wt 154.0 lb

## 2017-08-05 DIAGNOSIS — I495 Sick sinus syndrome: Secondary | ICD-10-CM

## 2017-08-05 DIAGNOSIS — I482 Chronic atrial fibrillation, unspecified: Secondary | ICD-10-CM

## 2017-08-05 DIAGNOSIS — Z950 Presence of cardiac pacemaker: Secondary | ICD-10-CM | POA: Diagnosis not present

## 2017-08-05 LAB — CUP PACEART INCLINIC DEVICE CHECK
Battery Voltage: 3.01 V
Brady Statistic RA Percent Paced: 0 %
Date Time Interrogation Session: 20190328180436
Implantable Lead Implant Date: 20070924
Implantable Lead Location: 753860
Lead Channel Impedance Value: 437.5 Ohm
Lead Channel Setting Pacing Amplitude: 2.5 V
Lead Channel Setting Sensing Sensitivity: 2 mV
MDC IDC LEAD IMPLANT DT: 20070924
MDC IDC LEAD LOCATION: 753859
MDC IDC MSMT BATTERY REMAINING LONGEVITY: 126 mo
MDC IDC MSMT LEADCHNL RV PACING THRESHOLD AMPLITUDE: 1.25 V
MDC IDC MSMT LEADCHNL RV PACING THRESHOLD PULSEWIDTH: 0.4 ms
MDC IDC MSMT LEADCHNL RV SENSING INTR AMPL: 12 mV
MDC IDC PG IMPLANT DT: 20161122
MDC IDC SET LEADCHNL RV PACING PULSEWIDTH: 0.4 ms
MDC IDC STAT BRADY RV PERCENT PACED: 47 %
Pulse Gen Model: 2240
Pulse Gen Serial Number: 7826824

## 2017-08-05 NOTE — Patient Instructions (Signed)
Medication Instructions:  Your physician recommends that you continue on your current medications as directed. Please refer to the Current Medication list given to you today.  Labwork: None ordered.  Testing/Procedures: None ordered.  Follow-Up: Your physician wants you to follow-up in: one year with Tommye Standard, PA.   You will receive a reminder letter in the mail two months in advance. If you don't receive a letter, please call our office to schedule the follow-up appointment.  Remote monitoring is used to monitor your Pacemaker from home. This monitoring reduces the number of office visits required to check your device to one time per year. It allows [place] to keep an eye on the functioning of your device to ensure it is working properly. You are scheduled for a device check from home on 10/18/2017. You may send your transmission at any time that day. If you have a wireless device, the transmission will be sent automatically. After your physician reviews your transmission, you will receive a postcard with your next transmission date.  Any Other Special Instructions Will Be Listed Below (If Applicable).  If you need a refill on your cardiac medications before your next appointment, please call your pharmacy.

## 2017-08-05 NOTE — Progress Notes (Signed)
PCP: Prince Solian, MD Primary Cardiologist:  Dr Bronson Ing Primary EP:  Dr Rayann Heman  [name] is a 83 y.o. male who presents today for routine electrophysiology followup.  Since last being seen in our clinic, the patient reports doing reasonably well.  His health has declined.  He has had several falls and Dr Bronson Ing has discontinued anticoagulation after discussion with patients daughter.  He has moved to [employer] at [facility].    Today, he denies symptoms of palpitations, chest pain, shortness of breath,  lower extremity edema, dizziness, presyncope, or syncope.  The patient is otherwise without complaint today.   Past Medical History:  Diagnosis Date  . Anxiety   . Arthritis   . CAD (coronary artery disease)    a.  cath 5/12: mLAD 50% then tandem 50%, dLAD 30%; D1 30%; OM1 occluded with collateral flow; pRCA 25%; EF 55% with inf HK;  occluded OM likely cause of perfusion defect on Myoview  . Colitis   . COPD (chronic obstructive pulmonary disease) ([place])   . Dementia    short term  . Depression   . Dizziness   . Dyslipidemia   . GERD (gastroesophageal reflux disease)   . Gout   . Hearing disorder   . Hyperlipidemia   . Hypertension   . Hypothyroidism   . Nonspecific abnormal unspecified cardiovascular function study   . Pacemaker   . Pelvic fracture ([place])   . Persistent atrial fibrillation ([place])   . Skin disorder    blister formation  . Tachy-brady syndrome ([place])    s/p pacer  . Ulcerative colitis    Past Surgical History:  Procedure Laterality Date  . BACK SURGERY  1975  . CARDIAC CATHETERIZATION  09/20/10   EF 55%.....Dr. Percival Spanish  . CATARACT EXTRACTION Right   . CATARACT EXTRACTION W/PHACO Left 12/17/2014   Procedure: CATARACT EXTRACTION PHACO AND INTRAOCULAR LENS PLACEMENT (IOC);  Surgeon: Tonny Branch, MD;  Location: [facility];  Service: Ophthalmology;  Laterality: Left;  CDE 7.44  . EP IMPLANTABLE DEVICE N/A 04/02/2015   Procedure:  PPM Generator  Changeout;  Surgeon: Thompson Grayer, MD;  Location: [facility] LAB;  Service: Cardiovascular;  Laterality: N/A;  . Implantation of a Medtronic dual chamber pacemaker  02/01/06   [name] [name] MD  . Randolm Idol / REPLACE / REMOVE PACEMAKER     MEDTRONIC....PPM Serial Number:  [id number].....Marland KitchenPPM Model Number:  [id number]....Marland KitchenPPM DOI:  02/01/2006    . KNEE SURGERY  2005   replacement  . LEFT HEART CATHETERIZATION WITH CORONARY ANGIOGRAM N/A 07/06/2014   Procedure: LEFT HEART CATHETERIZATION WITH CORONARY ANGIOGRAM;  Surgeon: Sinclair Grooms, MD;  Location: [facility] CATH LAB;  Service: Cardiovascular;  Laterality: N/A;  . Left knee arthroscopy with debridement  04/23/2006   [name] M.D  . Left knee total knee  replacement  05/13/07   [name] M.D.  . LUMBAR SPINE SURGERY      ROS- all systems are reviewed and negative except as per HPI above  Current Outpatient Medications  Medication Sig Dispense Refill  . aspirin EC 81 MG tablet Take 81 mg by mouth daily.    . cholecalciferol (VITAMIN D) 1000 units tablet Take 1 capsule by mouth daily with supper.     . levothyroxine (SYNTHROID, LEVOTHROID) 50 MCG tablet Take 50 mcg by mouth daily before breakfast.     . Melatonin-Pyridoxine (MELATIN PO) Take 3 mg by mouth at bedtime.    . metoprolol tartrate (  LOPRESSOR) 50 MG tablet TAKE ONE TABLET BY MOUTH TWICE A DAY 180 tablet 0  . nitroGLYCERIN (NITROSTAT) 0.4 MG SL tablet Place 1 tablet (0.4 mg total) under the tongue every 5 (five) minutes as needed for chest pain. 25 tablet 3  . sertraline (ZOLOFT) 50 MG tablet Take 50 mg by mouth daily.    Marland Kitchen SHINGRIX injection      No current facility-administered medications for this visit.     Physical Exam: Vitals:   08/05/17 1401  BP: 120/80  Pulse: 80  Weight: 69.9 kg (154 lb)  Height: 6' 1"  (1.854 m)    GEN- The patient is well appearing, alert and oriented x 3 today.   Head- normocephalic, atraumatic Eyes-  Sclera clear, conjunctiva  pink Ears- hearing intact Oropharynx- clear Lungs- Clear to ausculation bilaterally, normal work of breathing Chest- pacemaker pocket is well healed Heart- irregular rate and rhythm, no murmurs, rubs or gallops, PMI not laterally displaced GI- soft, NT, ND, + BS Extremities- no clubbing, cyanosis, or edema  Pacemaker interrogation- reviewed in detail today,  See PACEART report  ekg tracing ordered today is personally reviewed and shows afib, RBBB, nonspecific St/T changes  Assessment and Plan:  1. Symptomatic bradycardia  Normal pacemaker function See Claudia Desanctis Art report He v paces 47%.  I have turned hysteresis rate on at 50 bpm to reduce V pacing.  Could consider switching to VVI 50 upon return  2. Permanent atrial fibrillation anticoagulation discontinued by Dr Bronson Ing due to falls/ hematomas I think that this is wise  3. HTN Stable No change required today  4. CAD No ischemic symptoms No changes  Merlin Return to see EP PA in a year  Thompson Grayer MD, [facility] 08/05/2017 2:10 PM

## 2017-08-07 LAB — CUP PACEART REMOTE DEVICE CHECK
Battery Remaining Percentage: 95.5 %
Battery Voltage: 3.01 V
Brady Statistic RV Percent Paced: 46 %
Implantable Lead Implant Date: 20070924
Implantable Lead Implant Date: 20070924
Implantable Lead Location: 753860
Implantable Lead Model: 5076
Implantable Pulse Generator Implant Date: 20161122
Lead Channel Impedance Value: 450 Ohm
Lead Channel Pacing Threshold Pulse Width: 0.4 ms
Lead Channel Setting Pacing Amplitude: 2.5 V
MDC IDC LEAD LOCATION: 753859
MDC IDC MSMT BATTERY REMAINING LONGEVITY: 127 mo
MDC IDC MSMT LEADCHNL RV PACING THRESHOLD AMPLITUDE: 1 V
MDC IDC MSMT LEADCHNL RV SENSING INTR AMPL: 11.1 mV
MDC IDC SESS DTM: 20190311070015
MDC IDC SET LEADCHNL RV PACING PULSEWIDTH: 0.4 ms
MDC IDC SET LEADCHNL RV SENSING SENSITIVITY: 2 mV
Pulse Gen Serial Number: 7826824

## 2017-09-27 DIAGNOSIS — E039 Hypothyroidism, unspecified: Secondary | ICD-10-CM | POA: Diagnosis not present

## 2017-09-27 DIAGNOSIS — Z8719 Personal history of other diseases of the digestive system: Secondary | ICD-10-CM | POA: Diagnosis not present

## 2017-09-27 DIAGNOSIS — J449 Chronic obstructive pulmonary disease, unspecified: Secondary | ICD-10-CM | POA: Diagnosis not present

## 2017-09-27 DIAGNOSIS — I480 Paroxysmal atrial fibrillation: Secondary | ICD-10-CM | POA: Diagnosis not present

## 2017-09-27 DIAGNOSIS — I2510 Atherosclerotic heart disease of native coronary artery without angina pectoris: Secondary | ICD-10-CM | POA: Diagnosis not present

## 2017-09-28 DIAGNOSIS — Z79899 Other long term (current) drug therapy: Secondary | ICD-10-CM | POA: Diagnosis not present

## 2017-10-04 DIAGNOSIS — F0391 Unspecified dementia with behavioral disturbance: Secondary | ICD-10-CM | POA: Diagnosis not present

## 2017-10-12 DIAGNOSIS — Z79899 Other long term (current) drug therapy: Secondary | ICD-10-CM | POA: Diagnosis not present

## 2017-10-18 ENCOUNTER — Ambulatory Visit (INDEPENDENT_AMBULATORY_CARE_PROVIDER_SITE_OTHER): Payer: Medicare Other | Admitting: *Deleted

## 2017-10-18 DIAGNOSIS — F0151 Vascular dementia with behavioral disturbance: Secondary | ICD-10-CM | POA: Diagnosis not present

## 2017-10-18 DIAGNOSIS — R2689 Other abnormalities of gait and mobility: Secondary | ICD-10-CM | POA: Diagnosis not present

## 2017-10-18 DIAGNOSIS — I495 Sick sinus syndrome: Secondary | ICD-10-CM

## 2017-10-18 DIAGNOSIS — I2510 Atherosclerotic heart disease of native coronary artery without angina pectoris: Secondary | ICD-10-CM | POA: Diagnosis not present

## 2017-10-18 DIAGNOSIS — I480 Paroxysmal atrial fibrillation: Secondary | ICD-10-CM | POA: Diagnosis not present

## 2017-10-18 NOTE — Progress Notes (Signed)
Remote pacemaker transmission.   

## 2017-10-19 ENCOUNTER — Encounter: Payer: Self-pay | Admitting: Cardiology

## 2017-10-19 DIAGNOSIS — L821 Other seborrheic keratosis: Secondary | ICD-10-CM | POA: Diagnosis not present

## 2017-10-19 DIAGNOSIS — L578 Other skin changes due to chronic exposure to nonionizing radiation: Secondary | ICD-10-CM | POA: Diagnosis not present

## 2017-10-19 DIAGNOSIS — L218 Other seborrheic dermatitis: Secondary | ICD-10-CM | POA: Diagnosis not present

## 2017-10-19 DIAGNOSIS — L850 Acquired ichthyosis: Secondary | ICD-10-CM | POA: Diagnosis not present

## 2017-10-19 DIAGNOSIS — D692 Other nonthrombocytopenic purpura: Secondary | ICD-10-CM | POA: Diagnosis not present

## 2017-10-19 NOTE — Progress Notes (Signed)
Letter  

## 2017-10-22 DIAGNOSIS — I10 Essential (primary) hypertension: Secondary | ICD-10-CM | POA: Diagnosis not present

## 2017-10-22 DIAGNOSIS — F0391 Unspecified dementia with behavioral disturbance: Secondary | ICD-10-CM | POA: Diagnosis not present

## 2017-10-22 DIAGNOSIS — Z9183 Wandering in diseases classified elsewhere: Secondary | ICD-10-CM | POA: Diagnosis not present

## 2017-10-24 DIAGNOSIS — Z7982 Long term (current) use of aspirin: Secondary | ICD-10-CM | POA: Diagnosis not present

## 2017-10-24 DIAGNOSIS — I10 Essential (primary) hypertension: Secondary | ICD-10-CM | POA: Diagnosis not present

## 2017-10-24 DIAGNOSIS — F0391 Unspecified dementia with behavioral disturbance: Secondary | ICD-10-CM | POA: Diagnosis not present

## 2017-10-24 DIAGNOSIS — Z9183 Wandering in diseases classified elsewhere: Secondary | ICD-10-CM | POA: Diagnosis not present

## 2017-10-24 DIAGNOSIS — I4891 Unspecified atrial fibrillation: Secondary | ICD-10-CM | POA: Diagnosis not present

## 2017-10-24 DIAGNOSIS — R260 Ataxic gait: Secondary | ICD-10-CM | POA: Diagnosis not present

## 2017-10-25 DIAGNOSIS — E039 Hypothyroidism, unspecified: Secondary | ICD-10-CM | POA: Diagnosis not present

## 2017-10-25 DIAGNOSIS — R197 Diarrhea, unspecified: Secondary | ICD-10-CM | POA: Diagnosis not present

## 2017-10-25 DIAGNOSIS — I10 Essential (primary) hypertension: Secondary | ICD-10-CM | POA: Diagnosis not present

## 2017-10-25 DIAGNOSIS — G301 Alzheimer's disease with late onset: Secondary | ICD-10-CM | POA: Diagnosis not present

## 2017-10-27 DIAGNOSIS — Z9183 Wandering in diseases classified elsewhere: Secondary | ICD-10-CM | POA: Diagnosis not present

## 2017-10-27 DIAGNOSIS — Z7982 Long term (current) use of aspirin: Secondary | ICD-10-CM | POA: Diagnosis not present

## 2017-10-27 DIAGNOSIS — I10 Essential (primary) hypertension: Secondary | ICD-10-CM | POA: Diagnosis not present

## 2017-10-27 DIAGNOSIS — I4891 Unspecified atrial fibrillation: Secondary | ICD-10-CM | POA: Diagnosis not present

## 2017-10-27 DIAGNOSIS — R260 Ataxic gait: Secondary | ICD-10-CM | POA: Diagnosis not present

## 2017-10-27 DIAGNOSIS — F0391 Unspecified dementia with behavioral disturbance: Secondary | ICD-10-CM | POA: Diagnosis not present

## 2017-10-28 DIAGNOSIS — Z7982 Long term (current) use of aspirin: Secondary | ICD-10-CM | POA: Diagnosis not present

## 2017-10-28 DIAGNOSIS — F0391 Unspecified dementia with behavioral disturbance: Secondary | ICD-10-CM | POA: Diagnosis not present

## 2017-10-28 DIAGNOSIS — I4891 Unspecified atrial fibrillation: Secondary | ICD-10-CM | POA: Diagnosis not present

## 2017-10-28 DIAGNOSIS — I10 Essential (primary) hypertension: Secondary | ICD-10-CM | POA: Diagnosis not present

## 2017-10-28 DIAGNOSIS — Z9183 Wandering in diseases classified elsewhere: Secondary | ICD-10-CM | POA: Diagnosis not present

## 2017-10-28 DIAGNOSIS — R260 Ataxic gait: Secondary | ICD-10-CM | POA: Diagnosis not present

## 2017-11-01 DIAGNOSIS — Z7982 Long term (current) use of aspirin: Secondary | ICD-10-CM | POA: Diagnosis not present

## 2017-11-01 DIAGNOSIS — Z9183 Wandering in diseases classified elsewhere: Secondary | ICD-10-CM | POA: Diagnosis not present

## 2017-11-01 DIAGNOSIS — F0391 Unspecified dementia with behavioral disturbance: Secondary | ICD-10-CM | POA: Diagnosis not present

## 2017-11-01 DIAGNOSIS — R260 Ataxic gait: Secondary | ICD-10-CM | POA: Diagnosis not present

## 2017-11-01 DIAGNOSIS — I10 Essential (primary) hypertension: Secondary | ICD-10-CM | POA: Diagnosis not present

## 2017-11-01 DIAGNOSIS — I4891 Unspecified atrial fibrillation: Secondary | ICD-10-CM | POA: Diagnosis not present

## 2017-11-01 LAB — CUP PACEART REMOTE DEVICE CHECK
Battery Voltage: 3.01 V
Brady Statistic RV Percent Paced: 18 %
Implantable Lead Implant Date: 20070924
Implantable Lead Implant Date: 20070924
Implantable Lead Location: 753860
Implantable Lead Model: 5076
Lead Channel Pacing Threshold Amplitude: 1.25 V
Lead Channel Sensing Intrinsic Amplitude: 9.7 mV
Lead Channel Setting Pacing Amplitude: 2.5 V
MDC IDC LEAD LOCATION: 753859
MDC IDC MSMT BATTERY REMAINING LONGEVITY: 134 mo
MDC IDC MSMT BATTERY REMAINING PERCENTAGE: 95.5 %
MDC IDC MSMT LEADCHNL RV IMPEDANCE VALUE: 440 Ohm
MDC IDC MSMT LEADCHNL RV PACING THRESHOLD PULSEWIDTH: 0.4 ms
MDC IDC PG IMPLANT DT: 20161122
MDC IDC PG SERIAL: 7826824
MDC IDC SESS DTM: 20190610060017
MDC IDC SET LEADCHNL RV PACING PULSEWIDTH: 0.4 ms
MDC IDC SET LEADCHNL RV SENSING SENSITIVITY: 2 mV

## 2017-11-02 DIAGNOSIS — F0391 Unspecified dementia with behavioral disturbance: Secondary | ICD-10-CM | POA: Diagnosis not present

## 2017-11-02 DIAGNOSIS — I10 Essential (primary) hypertension: Secondary | ICD-10-CM | POA: Diagnosis not present

## 2017-11-02 DIAGNOSIS — Z9183 Wandering in diseases classified elsewhere: Secondary | ICD-10-CM | POA: Diagnosis not present

## 2017-11-02 DIAGNOSIS — I4891 Unspecified atrial fibrillation: Secondary | ICD-10-CM | POA: Diagnosis not present

## 2017-11-02 DIAGNOSIS — Z7982 Long term (current) use of aspirin: Secondary | ICD-10-CM | POA: Diagnosis not present

## 2017-11-02 DIAGNOSIS — R260 Ataxic gait: Secondary | ICD-10-CM | POA: Diagnosis not present

## 2017-11-03 DIAGNOSIS — Z9183 Wandering in diseases classified elsewhere: Secondary | ICD-10-CM | POA: Diagnosis not present

## 2017-11-03 DIAGNOSIS — I10 Essential (primary) hypertension: Secondary | ICD-10-CM | POA: Diagnosis not present

## 2017-11-03 DIAGNOSIS — I4891 Unspecified atrial fibrillation: Secondary | ICD-10-CM | POA: Diagnosis not present

## 2017-11-03 DIAGNOSIS — Z7982 Long term (current) use of aspirin: Secondary | ICD-10-CM | POA: Diagnosis not present

## 2017-11-03 DIAGNOSIS — F0391 Unspecified dementia with behavioral disturbance: Secondary | ICD-10-CM | POA: Diagnosis not present

## 2017-11-03 DIAGNOSIS — R260 Ataxic gait: Secondary | ICD-10-CM | POA: Diagnosis not present

## 2017-11-04 DIAGNOSIS — I10 Essential (primary) hypertension: Secondary | ICD-10-CM | POA: Diagnosis not present

## 2017-11-04 DIAGNOSIS — F0391 Unspecified dementia with behavioral disturbance: Secondary | ICD-10-CM | POA: Diagnosis not present

## 2017-11-04 DIAGNOSIS — I4891 Unspecified atrial fibrillation: Secondary | ICD-10-CM | POA: Diagnosis not present

## 2017-11-04 DIAGNOSIS — R260 Ataxic gait: Secondary | ICD-10-CM | POA: Diagnosis not present

## 2017-11-04 DIAGNOSIS — Z7982 Long term (current) use of aspirin: Secondary | ICD-10-CM | POA: Diagnosis not present

## 2017-11-04 DIAGNOSIS — Z9183 Wandering in diseases classified elsewhere: Secondary | ICD-10-CM | POA: Diagnosis not present

## 2017-11-05 DIAGNOSIS — F0391 Unspecified dementia with behavioral disturbance: Secondary | ICD-10-CM | POA: Diagnosis not present

## 2017-11-05 DIAGNOSIS — Z7982 Long term (current) use of aspirin: Secondary | ICD-10-CM | POA: Diagnosis not present

## 2017-11-05 DIAGNOSIS — I4891 Unspecified atrial fibrillation: Secondary | ICD-10-CM | POA: Diagnosis not present

## 2017-11-05 DIAGNOSIS — Z9183 Wandering in diseases classified elsewhere: Secondary | ICD-10-CM | POA: Diagnosis not present

## 2017-11-05 DIAGNOSIS — R260 Ataxic gait: Secondary | ICD-10-CM | POA: Diagnosis not present

## 2017-11-05 DIAGNOSIS — I10 Essential (primary) hypertension: Secondary | ICD-10-CM | POA: Diagnosis not present

## 2017-11-09 DIAGNOSIS — H5212 Myopia, left eye: Secondary | ICD-10-CM | POA: Diagnosis not present

## 2017-11-09 DIAGNOSIS — H5201 Hypermetropia, right eye: Secondary | ICD-10-CM | POA: Diagnosis not present

## 2017-11-09 DIAGNOSIS — Z961 Presence of intraocular lens: Secondary | ICD-10-CM | POA: Diagnosis not present

## 2017-11-18 DIAGNOSIS — Z9183 Wandering in diseases classified elsewhere: Secondary | ICD-10-CM | POA: Diagnosis not present

## 2017-11-18 DIAGNOSIS — F0391 Unspecified dementia with behavioral disturbance: Secondary | ICD-10-CM | POA: Diagnosis not present

## 2017-11-18 DIAGNOSIS — I4891 Unspecified atrial fibrillation: Secondary | ICD-10-CM | POA: Diagnosis not present

## 2017-11-18 DIAGNOSIS — R260 Ataxic gait: Secondary | ICD-10-CM | POA: Diagnosis not present

## 2017-11-18 DIAGNOSIS — I10 Essential (primary) hypertension: Secondary | ICD-10-CM | POA: Diagnosis not present

## 2017-11-18 DIAGNOSIS — Z7982 Long term (current) use of aspirin: Secondary | ICD-10-CM | POA: Diagnosis not present

## 2017-11-19 DIAGNOSIS — F0391 Unspecified dementia with behavioral disturbance: Secondary | ICD-10-CM | POA: Diagnosis not present

## 2017-11-19 DIAGNOSIS — Z7982 Long term (current) use of aspirin: Secondary | ICD-10-CM | POA: Diagnosis not present

## 2017-11-19 DIAGNOSIS — R260 Ataxic gait: Secondary | ICD-10-CM | POA: Diagnosis not present

## 2017-11-19 DIAGNOSIS — Z9183 Wandering in diseases classified elsewhere: Secondary | ICD-10-CM | POA: Diagnosis not present

## 2017-11-19 DIAGNOSIS — I10 Essential (primary) hypertension: Secondary | ICD-10-CM | POA: Diagnosis not present

## 2017-11-19 DIAGNOSIS — I4891 Unspecified atrial fibrillation: Secondary | ICD-10-CM | POA: Diagnosis not present

## 2017-11-23 DIAGNOSIS — R260 Ataxic gait: Secondary | ICD-10-CM | POA: Diagnosis not present

## 2017-11-23 DIAGNOSIS — I10 Essential (primary) hypertension: Secondary | ICD-10-CM | POA: Diagnosis not present

## 2017-11-23 DIAGNOSIS — Z7982 Long term (current) use of aspirin: Secondary | ICD-10-CM | POA: Diagnosis not present

## 2017-11-23 DIAGNOSIS — Z9183 Wandering in diseases classified elsewhere: Secondary | ICD-10-CM | POA: Diagnosis not present

## 2017-11-23 DIAGNOSIS — I4891 Unspecified atrial fibrillation: Secondary | ICD-10-CM | POA: Diagnosis not present

## 2017-11-23 DIAGNOSIS — F0391 Unspecified dementia with behavioral disturbance: Secondary | ICD-10-CM | POA: Diagnosis not present

## 2017-11-25 DIAGNOSIS — Z7982 Long term (current) use of aspirin: Secondary | ICD-10-CM | POA: Diagnosis not present

## 2017-11-25 DIAGNOSIS — R260 Ataxic gait: Secondary | ICD-10-CM | POA: Diagnosis not present

## 2017-11-25 DIAGNOSIS — F0391 Unspecified dementia with behavioral disturbance: Secondary | ICD-10-CM | POA: Diagnosis not present

## 2017-11-25 DIAGNOSIS — Z9183 Wandering in diseases classified elsewhere: Secondary | ICD-10-CM | POA: Diagnosis not present

## 2017-11-25 DIAGNOSIS — I4891 Unspecified atrial fibrillation: Secondary | ICD-10-CM | POA: Diagnosis not present

## 2017-11-25 DIAGNOSIS — I10 Essential (primary) hypertension: Secondary | ICD-10-CM | POA: Diagnosis not present

## 2017-11-26 DIAGNOSIS — R260 Ataxic gait: Secondary | ICD-10-CM | POA: Diagnosis not present

## 2017-11-26 DIAGNOSIS — I10 Essential (primary) hypertension: Secondary | ICD-10-CM | POA: Diagnosis not present

## 2017-11-26 DIAGNOSIS — I4891 Unspecified atrial fibrillation: Secondary | ICD-10-CM | POA: Diagnosis not present

## 2017-11-26 DIAGNOSIS — Z9183 Wandering in diseases classified elsewhere: Secondary | ICD-10-CM | POA: Diagnosis not present

## 2017-11-26 DIAGNOSIS — F0391 Unspecified dementia with behavioral disturbance: Secondary | ICD-10-CM | POA: Diagnosis not present

## 2017-11-26 DIAGNOSIS — Z7982 Long term (current) use of aspirin: Secondary | ICD-10-CM | POA: Diagnosis not present

## 2017-11-29 DIAGNOSIS — F0391 Unspecified dementia with behavioral disturbance: Secondary | ICD-10-CM | POA: Diagnosis not present

## 2017-11-29 DIAGNOSIS — F5109 Other insomnia not due to a substance or known physiological condition: Secondary | ICD-10-CM | POA: Diagnosis not present

## 2017-11-29 DIAGNOSIS — I10 Essential (primary) hypertension: Secondary | ICD-10-CM | POA: Diagnosis not present

## 2017-11-29 DIAGNOSIS — R4189 Other symptoms and signs involving cognitive functions and awareness: Secondary | ICD-10-CM | POA: Diagnosis not present

## 2017-11-29 DIAGNOSIS — Z7982 Long term (current) use of aspirin: Secondary | ICD-10-CM | POA: Diagnosis not present

## 2017-11-29 DIAGNOSIS — Z9183 Wandering in diseases classified elsewhere: Secondary | ICD-10-CM | POA: Diagnosis not present

## 2017-11-29 DIAGNOSIS — I4891 Unspecified atrial fibrillation: Secondary | ICD-10-CM | POA: Diagnosis not present

## 2017-11-29 DIAGNOSIS — R260 Ataxic gait: Secondary | ICD-10-CM | POA: Diagnosis not present

## 2017-11-29 DIAGNOSIS — Z79899 Other long term (current) drug therapy: Secondary | ICD-10-CM | POA: Diagnosis not present

## 2017-11-30 ENCOUNTER — Emergency Department (HOSPITAL_COMMUNITY)
Admission: EM | Admit: 2017-11-30 | Discharge: 2017-11-30 | Disposition: A | Discharge status: Home / Self Care | Payer: Medicare Other | Attending: Emergency Medicine | Admitting: Emergency Medicine

## 2017-11-30 ENCOUNTER — Encounter (HOSPITAL_COMMUNITY): Payer: Self-pay | Admitting: Emergency Medicine

## 2017-11-30 ENCOUNTER — Other Ambulatory Visit: Payer: Self-pay

## 2017-11-30 ENCOUNTER — Emergency Department (HOSPITAL_COMMUNITY): Payer: Medicare Other

## 2017-11-30 DIAGNOSIS — Z7982 Long term (current) use of aspirin: Secondary | ICD-10-CM | POA: Insufficient documentation

## 2017-11-30 DIAGNOSIS — S0003XA Contusion of scalp, initial encounter: Secondary | ICD-10-CM | POA: Diagnosis not present

## 2017-11-30 DIAGNOSIS — J449 Chronic obstructive pulmonary disease, unspecified: Secondary | ICD-10-CM | POA: Diagnosis not present

## 2017-11-30 DIAGNOSIS — S0181XA Laceration without foreign body of other part of head, initial encounter: Secondary | ICD-10-CM | POA: Insufficient documentation

## 2017-11-30 DIAGNOSIS — E039 Hypothyroidism, unspecified: Secondary | ICD-10-CM | POA: Insufficient documentation

## 2017-11-30 DIAGNOSIS — Y939 Activity, unspecified: Secondary | ICD-10-CM | POA: Diagnosis not present

## 2017-11-30 DIAGNOSIS — Z87891 Personal history of nicotine dependence: Secondary | ICD-10-CM | POA: Diagnosis not present

## 2017-11-30 DIAGNOSIS — I2510 Atherosclerotic heart disease of native coronary artery without angina pectoris: Secondary | ICD-10-CM | POA: Diagnosis not present

## 2017-11-30 DIAGNOSIS — R609 Edema, unspecified: Secondary | ICD-10-CM | POA: Diagnosis not present

## 2017-11-30 DIAGNOSIS — F0390 Unspecified dementia without behavioral disturbance: Secondary | ICD-10-CM | POA: Diagnosis not present

## 2017-11-30 DIAGNOSIS — W01198A Fall on same level from slipping, tripping and stumbling with subsequent striking against other object, initial encounter: Secondary | ICD-10-CM | POA: Diagnosis not present

## 2017-11-30 DIAGNOSIS — W19XXXA Unspecified fall, initial encounter: Secondary | ICD-10-CM | POA: Diagnosis not present

## 2017-11-30 DIAGNOSIS — S199XXA Unspecified injury of neck, initial encounter: Secondary | ICD-10-CM | POA: Diagnosis not present

## 2017-11-30 DIAGNOSIS — S0990XA Unspecified injury of head, initial encounter: Secondary | ICD-10-CM | POA: Diagnosis not present

## 2017-11-30 DIAGNOSIS — Y92129 Unspecified place in nursing home as the place of occurrence of the external cause: Secondary | ICD-10-CM | POA: Insufficient documentation

## 2017-11-30 DIAGNOSIS — Y999 Unspecified external cause status: Secondary | ICD-10-CM | POA: Insufficient documentation

## 2017-11-30 DIAGNOSIS — I10 Essential (primary) hypertension: Secondary | ICD-10-CM | POA: Diagnosis not present

## 2017-11-30 DIAGNOSIS — Z950 Presence of cardiac pacemaker: Secondary | ICD-10-CM | POA: Insufficient documentation

## 2017-11-30 LAB — CBC WITH DIFFERENTIAL/PLATELET
Abs Immature Granulocytes: 0.1 10*3/uL (ref 0.0–0.1)
BASOS ABS: 0.1 10*3/uL (ref 0.0–0.1)
BASOS PCT: 1 %
EOS ABS: 0.1 10*3/uL (ref 0.0–0.7)
EOS PCT: 1 %
HEMATOCRIT: 39.8 % (ref 39.0–52.0)
Hemoglobin: 12.5 g/dL — ABNORMAL LOW (ref 13.0–17.0)
IMMATURE GRANULOCYTES: 1 %
LYMPHS ABS: 1.2 10*3/uL (ref 0.7–4.0)
Lymphocytes Relative: 12 %
MCH: 27.2 pg (ref 26.0–34.0)
MCHC: 31.4 g/dL (ref 30.0–36.0)
MCV: 86.7 fL (ref 78.0–100.0)
Monocytes Absolute: 1 10*3/uL (ref 0.1–1.0)
Monocytes Relative: 10 %
NEUTROS PCT: 75 %
Neutro Abs: 7.7 10*3/uL (ref 1.7–7.7)
PLATELETS: 228 10*3/uL (ref 150–400)
RBC: 4.59 MIL/uL (ref 4.22–5.81)
RDW: 15.2 % (ref 11.5–15.5)
WBC: 10.2 10*3/uL (ref 4.0–10.5)

## 2017-11-30 LAB — COMPREHENSIVE METABOLIC PANEL
ALBUMIN: 3.4 g/dL — AB (ref 3.5–5.0)
ALT: 18 U/L (ref 0–44)
ANION GAP: 10 (ref 5–15)
AST: 21 U/L (ref 15–41)
Alkaline Phosphatase: 102 U/L (ref 38–126)
BILIRUBIN TOTAL: 1.4 mg/dL — AB (ref 0.3–1.2)
BUN: 16 mg/dL (ref 8–23)
CO2: 26 mmol/L (ref 22–32)
Calcium: 9.1 mg/dL (ref 8.9–10.3)
Chloride: 101 mmol/L (ref 98–111)
Creatinine, Ser: 0.81 mg/dL (ref 0.61–1.24)
GFR calc Af Amer: >60 mL/min (ref >60)
GFR calc non Af Amer: >60 mL/min (ref >60)
GLUCOSE: 101 mg/dL — AB (ref 70–99)
POTASSIUM: 4.2 mmol/L (ref 3.5–5.1)
SODIUM: 137 mmol/L (ref 135–145)
TOTAL PROTEIN: 7.5 g/dL (ref 6.5–8.1)

## 2017-11-30 NOTE — Discharge Instructions (Addendum)
Your CT images did not show evidence of fracture or dislocations.  No evidence of intracranial injury.    Please follow-up with your primary doctor in the next several days.  If any symptoms change or worsen, please return to the nearest emergency department.

## 2017-11-30 NOTE — ED Provider Notes (Signed)
5:10 PM Care assumed from Dr. Lita Mains.  At time of transfer care, patient awaiting CT imaging of the head neck after a fall to look for traumatic injuries.  Patient was seen to have a hematoma but no evidence of fracture in the skull or fracture/dislocation of the neck.  No evidence of intracranial injury.  A full discussion of the lab work and imaging was had with the patient and daughter and they understood work-up results.    Wound was cleaned and dressed previously to my evaluation.    Plan of care is to discharge the patient at the CT imaging is reassuring.    Reassuring imaging, patient will be discharged to continue outpatient follow-up with PCP.  He understood return precautions and follow-up instruction.  Patient discharged in good condition.   Clinical Impression: 1. Injury of head, initial encounter   2. Contusion of scalp, initial encounter     Disposition: Discharge  Condition: Good  I have discussed the results, Dx and Tx plan with the pt(& family if present). He/she/they expressed understanding and agree(s) with the plan. Discharge instructions discussed at great length. Strict return precautions discussed and pt &/or family have verbalized understanding of the instructions. No further questions at time of discharge.    New Prescriptions   No medications on file    Follow Up: [place], [place] [address] [place] [address] [id number] mc [place]          Ariq Khamis, Gwenyth Allegra, MD 11/30/17 [phone]

## 2017-11-30 NOTE — ED Triage Notes (Signed)
Arrived via EMS from [place]. Patient witnessed fall hit forehead hemtoma not on blood thinners. Alert history of dementia.

## 2017-11-30 NOTE — ED Notes (Signed)
Pt's left knee wound irrigated and bacitracin applied with band-aid. Pt's forehead wound irrigated and steri strips applied.

## 2017-11-30 NOTE — ED Provider Notes (Signed)
[facility] EMERGENCY DEPARTMENT Provider Note   CSN: [id number] Arrival date & time: 11/30/17  1127     History   Chief Complaint Chief Complaint  Patient presents with  . Fall  . Head Injury    HPI [name] is a 83 y.o. male.  HPI Patient has history of dementia.  Unable to contribute to history.  Level 5 caveat.  Patient brought in by EMS from nursing home.  Had a witnessed fall from standing striking his head on the floor.  Patient is not on any blood thinner.  Last tetanus 2017. Past Medical History:  Diagnosis Date  . Anxiety   . Arthritis   . CAD (coronary artery disease)    a.  cath 5/12: mLAD 50% then tandem 50%, dLAD 30%; D1 30%; OM1 occluded with collateral flow; pRCA 25%; EF 55% with inf HK;  occluded OM likely cause of perfusion defect on Myoview  . Colitis   . COPD (chronic obstructive pulmonary disease) ([place])   . Dementia    short term  . Depression   . Dizziness   . Dyslipidemia   . GERD (gastroesophageal reflux disease)   . Gout   . Hearing disorder   . Hyperlipidemia   . Hypertension   . Hypothyroidism   . Nonspecific abnormal unspecified cardiovascular function study   . Pacemaker   . Pelvic fracture ([place])   . Persistent atrial fibrillation ([place])   . Skin disorder    blister formation  . Tachy-brady syndrome ([place])    s/p pacer  . Ulcerative colitis     Patient Active Problem List   Diagnosis Date Noted  . Adult failure to thrive 11/19/2016  . Hoarseness 02/06/2016  . COPD mixed type ([place]) 08/07/2014  . H/O asbestos exposure 08/07/2014  . Chest pain 07/06/2014  . Coronary artery disease involving native coronary artery of native heart with other form of angina pectoris ([place])   . Anxiety 01/19/2012  . Skin rash 06/08/2011  . Fatigue 12/02/2010  . Coronary atherosclerosis of native coronary artery 10/01/2010  . ARTHRITIS, RIGHT KNEE 09/30/2010  . Radicular leg pain 09/30/2010  . Abnormal cardiovascular function  study 09/09/2010  . OTHER MONONEURITIS OF LOWER LIMB 07/16/2010  . UNSPECIFIED MONONEURITIS OF LOWER LIMB 06/05/2010  . ARTHRITIS, RIGHT KNEE 06/05/2010  . LEG PAIN 06/05/2010  . Hyperlipidemia 11/19/2008  . HTN (hypertension) 11/19/2008  . Atrial fibrillation ([place]) 11/19/2008  . BRADYCARDIA-TACHYCARDIA SYNDROME 11/19/2008  . ULCERATIVE COLITIS 11/19/2008  . DIZZINESS 11/19/2008  . PPM-Medtronic 11/19/2008    Past Surgical History:  Procedure Laterality Date  . BACK SURGERY  1975  . CARDIAC CATHETERIZATION  09/20/10   EF 55%.....Dr. Percival Spanish  . CATARACT EXTRACTION Right   . CATARACT EXTRACTION W/PHACO Left 12/17/2014   Procedure: CATARACT EXTRACTION PHACO AND INTRAOCULAR LENS PLACEMENT (IOC);  Surgeon: Tonny Branch, MD;  Location: [facility];  Service: Ophthalmology;  Laterality: Left;  CDE 7.44  . EP IMPLANTABLE DEVICE N/A 04/02/2015   Procedure:  PPM Generator Changeout;  Surgeon: Thompson Grayer, MD;  Location: [facility] LAB;  Service: Cardiovascular;  Laterality: N/A;  . Implantation of a Medtronic dual chamber pacemaker  02/01/06   [name] [name] MD  . Randolm Idol / REPLACE / REMOVE PACEMAKER     MEDTRONIC....PPM Serial Number:  [id number].....Marland KitchenPPM Model Number:  [id number]....Marland KitchenPPM DOI:  02/01/2006    . KNEE SURGERY  2005   replacement  . LEFT HEART CATHETERIZATION WITH CORONARY ANGIOGRAM N/A 07/06/2014  Procedure: LEFT HEART CATHETERIZATION WITH CORONARY ANGIOGRAM;  Surgeon: Sinclair Grooms, MD;  Location: [facility] CATH LAB;  Service: Cardiovascular;  Laterality: N/A;  . Left knee arthroscopy with debridement  04/23/2006   [name] M.D  . Left knee total knee  replacement  05/13/07   [name] M.D.  . LUMBAR SPINE SURGERY          Home Medications    Prior to Admission medications   Medication Sig Start Date End Date Taking? Authorizing Provider  aspirin EC 81 MG tablet Take 81 mg by mouth daily.    [provider]  cholecalciferol (VITAMIN D) 1000 units  tablet Take 1 capsule by mouth daily with supper.     [provider]  levothyroxine (SYNTHROID, LEVOTHROID) 50 MCG tablet Take 50 mcg by mouth daily before breakfast.  09/11/13   [provider]  Melatonin-Pyridoxine (MELATIN PO) Take 3 mg by mouth at bedtime.    [provider]  metoprolol tartrate (LOPRESSOR) 50 MG tablet TAKE ONE TABLET BY MOUTH TWICE A DAY 07/05/17   Herminio Commons, MD  nitroGLYCERIN (NITROSTAT) 0.4 MG SL tablet Place 1 tablet (0.4 mg total) under the tongue every 5 (five) minutes as needed for chest pain. 06/19/14   Herminio Commons, MD  sertraline (ZOLOFT) 50 MG tablet Take 50 mg by mouth daily.    [provider]  [facility] injection  06/15/17   [provider]    Family History Family History  Problem Relation Age of Onset  . Lung disease Father        Emphysema  . Hypertension Father   . Peptic Ulcer Disease Father   . COPD Father   . Hypertension Mother   . Liver cancer Mother   . Pernicious anemia Mother   . Depression Mother   . Kidney disease Mother   . Colitis Mother   . Asthma Sister   . Healthy Daughter   . Healthy Daughter   . Healthy Son   . COPD Sister   . Glaucoma Sister   . Hypertension Sister   . Cancer Sister     Social History Social History   Tobacco Use  . Smoking status: Former Smoker    Packs/day: 1.00    Years: 15.00    Pack years: 15.00    Types: Cigarettes    Start date: 05/13/1955    Last attempt to quit: 05/12/1983    Years since quitting: 34.5  . Smokeless tobacco: Never Used  Substance Use Topics  . Alcohol use: No    Alcohol/week: 0.0 oz  . Drug use: No     Allergies   Nsaids   Review of Systems Review of Systems  Unable to perform ROS: Dementia     Physical Exam Updated Vital Signs BP (!) 160/79   Pulse 71   Temp 98.3 F (36.8 C) (Oral)   Resp (!) 25   Ht 5' 9"  (1.753 m)   Wt 69.9 kg (154 lb)   SpO2 97%   BMI 22.74 kg/m   Physical Exam    Constitutional: He appears well-developed and well-nourished. No distress.  HENT:  Head: Normocephalic.  Mouth/Throat: Oropharynx is clear and moist.  Right forehead hematoma with small laceration roughly 1 cm.  No active bleeding.  Midface is stable.  No intraoral lesions.  Eyes: Pupils are equal, round, and reactive to light. EOM are normal.  Neck: Normal range of motion. Neck supple.  Does not  appear to have posterior midline cervical tenderness to palpation.  Rolled towel in place by EMS.  Cardiovascular: Normal rate and regular rhythm. Exam reveals no gallop and no friction rub.  No murmur heard. Pulmonary/Chest: Effort normal and breath sounds normal. No stridor. No respiratory distress. He has no wheezes. He has no rales. He exhibits no tenderness.  Abdominal: Soft. Bowel sounds are normal. There is no tenderness. There is no rebound and no guarding.  Musculoskeletal: Normal range of motion. He exhibits no edema or tenderness.  No midline thoracic or lumbar tenderness.  Pelvis is stable.  Full range of motion of all joints including bilateral hips.  Distal pulses intact.  Neurological: He is alert.  Alert, answering questions and follow commands.  Poor memory.  5/5 motor in all extremities.  Sensation intact.  Skin: Skin is warm and dry. No rash noted. He is not diaphoretic. No erythema.  Nursing note and vitals reviewed.    ED Treatments / Results  Labs (all labs ordered are listed, but only abnormal results are displayed) Labs Reviewed  CBC WITH DIFFERENTIAL/PLATELET - Abnormal; Notable for the following components:      Result Value   Hemoglobin 12.5 (*)    All other components within normal limits  COMPREHENSIVE METABOLIC PANEL - Abnormal; Notable for the following components:   Glucose, Bld 101 (*)    Albumin 3.4 (*)    Total Bilirubin 1.4 (*)    All other components within normal limits    EKG EKG Interpretation  Date/Time:  Tuesday November 30 2017 12:14:04  EDT Ventricular Rate:  60 PR Interval:    QRS Duration: 152 QT Interval:  456 QTC Calculation: 456 R Axis:   118 Text Interpretation:  Atrial fibrillation with occasional ventricular-paced complexes Right bundle branch block T wave abnormality, consider inferior ischemia Abnormal ECG Confirmed by Julianne Rice [phone]) on 11/30/2017 1:44:12 PM   Radiology Ct Head Wo Contrast  Result Date: 11/30/2017 CLINICAL DATA:  Head trauma, minor, GCS greater than 13. High clinical risk. Initial exam. Cervical spine trauma. Fall from standing position. Right supraorbital scalp hematoma. EXAM: CT HEAD WITHOUT CONTRAST CT CERVICAL SPINE WITHOUT CONTRAST TECHNIQUE: Multidetector CT imaging of the head and cervical spine was performed following the standard protocol without intravenous contrast. Multiplanar CT image reconstructions of the cervical spine were also generated. COMPARISON:  CT head without contrast 05/24/2016. FINDINGS: CT HEAD FINDINGS Brain: Moderate generalized atrophy and white matter disease is similar the prior study. No acute infarct, intracranial hemorrhage, or mass lesion is present. The ventricles are stable in size. No significant extra-axial fluid collection is present. Vascular: Vascular calcifications are present in the cavernous internal carotid arteries and at the dural margin of both vertebral arteries. There is no hyperdense vessel. Skull: A prominent right supraorbital scalp hematoma present. There is no underlying fracture. Calvarium is intact. Sinuses/Orbits: Bilateral lens replacements are present. Globes and orbits are otherwise within normal limits bilaterally. The paranasal sinuses and mastoid air cells are clear. CT CERVICAL SPINE FINDINGS Alignment: Slight degenerative is otherwise retrolisthesis is present at C3-4 anatomic. AP alignment. Skull base and vertebrae: Skull base is unremarkable. Craniocervical junction is normal. Vascular calcifications are evident. No acute cervical  spine fracture is present. Multilevel endplate degenerative changes are chronic. Facet spurring and hypertrophy is greatest on the left at C3-4 and C4-5. Uncovertebral spurring is most prominent on the right at C5-6 and C6-7. Soft tissues and spinal canal: Vascular calcifications are present at the carotid bifurcations bilaterally. Thyroid  is within normal limits. No acute trauma to the soft tissues of the neck is evident. Spinal canal is unremarkable apart from chronic spondylosis. Disc levels: Foraminal narrowing is worse right than left at C5-6 and C6-7 due to uncovertebral disease severe left foraminal narrowing is present at C3-4 due to facet hypertrophy and uncovertebral disease. Upper chest: The lung apices are clear. IMPRESSION: 1. Prominent right supraorbital scalp hematoma without underlying fracture. 2. Stable atrophy and white matter disease. This likely reflects the sequela of chronic microvascular ischemia. 3. No acute intracranial abnormality. 4. Multilevel degenerative changes in the cervical spine without acute fracture or traumatic subluxation. Electronically Signed   By: San Morelle M.D.   On: 11/30/2017 16:49   Ct Cervical Spine Wo Contrast  Result Date: 11/30/2017 CLINICAL DATA:  Head trauma, minor, GCS greater than 13. High clinical risk. Initial exam. Cervical spine trauma. Fall from standing position. Right supraorbital scalp hematoma. EXAM: CT HEAD WITHOUT CONTRAST CT CERVICAL SPINE WITHOUT CONTRAST TECHNIQUE: Multidetector CT imaging of the head and cervical spine was performed following the standard protocol without intravenous contrast. Multiplanar CT image reconstructions of the cervical spine were also generated. COMPARISON:  CT head without contrast 05/24/2016. FINDINGS: CT HEAD FINDINGS Brain: Moderate generalized atrophy and white matter disease is similar the prior study. No acute infarct, intracranial hemorrhage, or mass lesion is present. The ventricles are stable in  size. No significant extra-axial fluid collection is present. Vascular: Vascular calcifications are present in the cavernous internal carotid arteries and at the dural margin of both vertebral arteries. There is no hyperdense vessel. Skull: A prominent right supraorbital scalp hematoma present. There is no underlying fracture. Calvarium is intact. Sinuses/Orbits: Bilateral lens replacements are present. Globes and orbits are otherwise within normal limits bilaterally. The paranasal sinuses and mastoid air cells are clear. CT CERVICAL SPINE FINDINGS Alignment: Slight degenerative is otherwise retrolisthesis is present at C3-4 anatomic. AP alignment. Skull base and vertebrae: Skull base is unremarkable. Craniocervical junction is normal. Vascular calcifications are evident. No acute cervical spine fracture is present. Multilevel endplate degenerative changes are chronic. Facet spurring and hypertrophy is greatest on the left at C3-4 and C4-5. Uncovertebral spurring is most prominent on the right at C5-6 and C6-7. Soft tissues and spinal canal: Vascular calcifications are present at the carotid bifurcations bilaterally. Thyroid is within normal limits. No acute trauma to the soft tissues of the neck is evident. Spinal canal is unremarkable apart from chronic spondylosis. Disc levels: Foraminal narrowing is worse right than left at C5-6 and C6-7 due to uncovertebral disease severe left foraminal narrowing is present at C3-4 due to facet hypertrophy and uncovertebral disease. Upper chest: The lung apices are clear. IMPRESSION: 1. Prominent right supraorbital scalp hematoma without underlying fracture. 2. Stable atrophy and white matter disease. This likely reflects the sequela of chronic microvascular ischemia. 3. No acute intracranial abnormality. 4. Multilevel degenerative changes in the cervical spine without acute fracture or traumatic subluxation. Electronically Signed   By: San Morelle M.D.   On:  11/30/2017 16:49    Procedures Procedures (including critical care time)  Medications Ordered in ED Medications - No data to display   Initial Impression / Assessment and Plan / ED Course  I have reviewed the triage vital signs and the nursing notes.  Pertinent labs & imaging results that were available during my care of the patient were reviewed by me and considered in my medical decision making (see chart for details).     Patient appears  to be at his baseline mental status.  Tetanus is up-to-date.  Laceration is superficial and will be irrigated and closed with Steri-Strips.  Awaiting CT head and cervical spine but anticipate discharge home if no significant findings.  Signed out to oncoming emergency physician.  Final Clinical Impressions(s) / ED Diagnoses   Final diagnoses:  Injury of head, initial encounter  Contusion of scalp, initial encounter    ED Discharge Orders    None       Julianne Rice, MD 12/01/17 [phone]

## 2017-11-30 NOTE — ED Notes (Signed)
Pt verbalizes understanding of d/c instructions. Pt taken to lobby in wheelchair at d/c with all belongings and with family.

## 2017-12-01 DIAGNOSIS — Z7982 Long term (current) use of aspirin: Secondary | ICD-10-CM | POA: Diagnosis not present

## 2017-12-01 DIAGNOSIS — F0391 Unspecified dementia with behavioral disturbance: Secondary | ICD-10-CM | POA: Diagnosis not present

## 2017-12-01 DIAGNOSIS — I10 Essential (primary) hypertension: Secondary | ICD-10-CM | POA: Diagnosis not present

## 2017-12-01 DIAGNOSIS — I4891 Unspecified atrial fibrillation: Secondary | ICD-10-CM | POA: Diagnosis not present

## 2017-12-01 DIAGNOSIS — R260 Ataxic gait: Secondary | ICD-10-CM | POA: Diagnosis not present

## 2017-12-01 DIAGNOSIS — Z9183 Wandering in diseases classified elsewhere: Secondary | ICD-10-CM | POA: Diagnosis not present

## 2017-12-02 DIAGNOSIS — Z9183 Wandering in diseases classified elsewhere: Secondary | ICD-10-CM | POA: Diagnosis not present

## 2017-12-02 DIAGNOSIS — I10 Essential (primary) hypertension: Secondary | ICD-10-CM | POA: Diagnosis not present

## 2017-12-02 DIAGNOSIS — I4891 Unspecified atrial fibrillation: Secondary | ICD-10-CM | POA: Diagnosis not present

## 2017-12-02 DIAGNOSIS — Z7982 Long term (current) use of aspirin: Secondary | ICD-10-CM | POA: Diagnosis not present

## 2017-12-02 DIAGNOSIS — H1131 Conjunctival hemorrhage, right eye: Secondary | ICD-10-CM | POA: Diagnosis not present

## 2017-12-02 DIAGNOSIS — F0391 Unspecified dementia with behavioral disturbance: Secondary | ICD-10-CM | POA: Diagnosis not present

## 2017-12-02 DIAGNOSIS — R260 Ataxic gait: Secondary | ICD-10-CM | POA: Diagnosis not present

## 2017-12-03 DIAGNOSIS — F0391 Unspecified dementia with behavioral disturbance: Secondary | ICD-10-CM | POA: Diagnosis not present

## 2017-12-03 DIAGNOSIS — Z9183 Wandering in diseases classified elsewhere: Secondary | ICD-10-CM | POA: Diagnosis not present

## 2017-12-03 DIAGNOSIS — Z7982 Long term (current) use of aspirin: Secondary | ICD-10-CM | POA: Diagnosis not present

## 2017-12-03 DIAGNOSIS — R260 Ataxic gait: Secondary | ICD-10-CM | POA: Diagnosis not present

## 2017-12-03 DIAGNOSIS — I10 Essential (primary) hypertension: Secondary | ICD-10-CM | POA: Diagnosis not present

## 2017-12-03 DIAGNOSIS — I4891 Unspecified atrial fibrillation: Secondary | ICD-10-CM | POA: Diagnosis not present

## 2017-12-06 ENCOUNTER — Other Ambulatory Visit: Payer: Self-pay

## 2017-12-06 ENCOUNTER — Emergency Department (HOSPITAL_COMMUNITY): Payer: Medicare Other

## 2017-12-06 ENCOUNTER — Other Ambulatory Visit (INDEPENDENT_AMBULATORY_CARE_PROVIDER_SITE_OTHER): Payer: Self-pay | Admitting: *Deleted

## 2017-12-06 ENCOUNTER — Encounter (INDEPENDENT_AMBULATORY_CARE_PROVIDER_SITE_OTHER): Payer: Self-pay | Admitting: Internal Medicine

## 2017-12-06 ENCOUNTER — Encounter (HOSPITAL_COMMUNITY): Payer: Self-pay | Admitting: Family Medicine

## 2017-12-06 ENCOUNTER — Ambulatory Visit (INDEPENDENT_AMBULATORY_CARE_PROVIDER_SITE_OTHER): Payer: Medicare Other | Admitting: Internal Medicine

## 2017-12-06 ENCOUNTER — Emergency Department (HOSPITAL_COMMUNITY)
Admission: EM | Admit: 2017-12-06 | Discharge: 2017-12-06 | Disposition: A | Discharge status: Home / Self Care | Payer: Medicare Other | Source: Home / Self Care | Attending: Emergency Medicine | Admitting: Emergency Medicine

## 2017-12-06 VITALS — BP 144/80 | HR 72 | Temp 98.0°F | Ht 73.0 in | Wt 144.9 lb

## 2017-12-06 DIAGNOSIS — R4689 Other symptoms and signs involving appearance and behavior: Secondary | ICD-10-CM | POA: Diagnosis not present

## 2017-12-06 DIAGNOSIS — S0993XA Unspecified injury of face, initial encounter: Secondary | ICD-10-CM | POA: Diagnosis not present

## 2017-12-06 DIAGNOSIS — J449 Chronic obstructive pulmonary disease, unspecified: Secondary | ICD-10-CM

## 2017-12-06 DIAGNOSIS — S0083XA Contusion of other part of head, initial encounter: Secondary | ICD-10-CM | POA: Diagnosis not present

## 2017-12-06 DIAGNOSIS — Z79899 Other long term (current) drug therapy: Secondary | ICD-10-CM

## 2017-12-06 DIAGNOSIS — R197 Diarrhea, unspecified: Secondary | ICD-10-CM

## 2017-12-06 DIAGNOSIS — Z7982 Long term (current) use of aspirin: Secondary | ICD-10-CM | POA: Insufficient documentation

## 2017-12-06 DIAGNOSIS — S1093XA Contusion of unspecified part of neck, initial encounter: Secondary | ICD-10-CM | POA: Diagnosis not present

## 2017-12-06 DIAGNOSIS — S42402A Unspecified fracture of lower end of left humerus, initial encounter for closed fracture: Secondary | ICD-10-CM | POA: Insufficient documentation

## 2017-12-06 DIAGNOSIS — Y999 Unspecified external cause status: Secondary | ICD-10-CM

## 2017-12-06 DIAGNOSIS — S51002A Unspecified open wound of left elbow, initial encounter: Secondary | ICD-10-CM

## 2017-12-06 DIAGNOSIS — R260 Ataxic gait: Secondary | ICD-10-CM | POA: Diagnosis not present

## 2017-12-06 DIAGNOSIS — M25422 Effusion, left elbow: Secondary | ICD-10-CM | POA: Diagnosis not present

## 2017-12-06 DIAGNOSIS — I4891 Unspecified atrial fibrillation: Secondary | ICD-10-CM | POA: Diagnosis not present

## 2017-12-06 DIAGNOSIS — K8020 Calculus of gallbladder without cholecystitis without obstruction: Secondary | ICD-10-CM | POA: Diagnosis not present

## 2017-12-06 DIAGNOSIS — W19XXXA Unspecified fall, initial encounter: Secondary | ICD-10-CM | POA: Insufficient documentation

## 2017-12-06 DIAGNOSIS — S0990XA Unspecified injury of head, initial encounter: Secondary | ICD-10-CM | POA: Diagnosis not present

## 2017-12-06 DIAGNOSIS — R102 Pelvic and perineal pain: Secondary | ICD-10-CM | POA: Diagnosis not present

## 2017-12-06 DIAGNOSIS — S0101XA Laceration without foreign body of scalp, initial encounter: Secondary | ICD-10-CM

## 2017-12-06 DIAGNOSIS — Y92129 Unspecified place in nursing home as the place of occurrence of the external cause: Secondary | ICD-10-CM | POA: Insufficient documentation

## 2017-12-06 DIAGNOSIS — E039 Hypothyroidism, unspecified: Secondary | ICD-10-CM | POA: Insufficient documentation

## 2017-12-06 DIAGNOSIS — I2510 Atherosclerotic heart disease of native coronary artery without angina pectoris: Secondary | ICD-10-CM | POA: Insufficient documentation

## 2017-12-06 DIAGNOSIS — R0781 Pleurodynia: Secondary | ICD-10-CM | POA: Diagnosis not present

## 2017-12-06 DIAGNOSIS — S59901A Unspecified injury of right elbow, initial encounter: Secondary | ICD-10-CM | POA: Diagnosis not present

## 2017-12-06 DIAGNOSIS — I10 Essential (primary) hypertension: Secondary | ICD-10-CM | POA: Insufficient documentation

## 2017-12-06 DIAGNOSIS — Z9183 Wandering in diseases classified elsewhere: Secondary | ICD-10-CM | POA: Diagnosis not present

## 2017-12-06 DIAGNOSIS — T148XXA Other injury of unspecified body region, initial encounter: Secondary | ICD-10-CM

## 2017-12-06 DIAGNOSIS — K5190 Ulcerative colitis, unspecified, without complications: Secondary | ICD-10-CM | POA: Diagnosis not present

## 2017-12-06 DIAGNOSIS — Y939 Activity, unspecified: Secondary | ICD-10-CM | POA: Insufficient documentation

## 2017-12-06 DIAGNOSIS — S199XXA Unspecified injury of neck, initial encounter: Secondary | ICD-10-CM | POA: Diagnosis not present

## 2017-12-06 DIAGNOSIS — R531 Weakness: Secondary | ICD-10-CM | POA: Diagnosis not present

## 2017-12-06 DIAGNOSIS — K5120 Ulcerative (chronic) proctitis without complications: Secondary | ICD-10-CM

## 2017-12-06 DIAGNOSIS — L089 Local infection of the skin and subcutaneous tissue, unspecified: Secondary | ICD-10-CM

## 2017-12-06 DIAGNOSIS — R350 Frequency of micturition: Secondary | ICD-10-CM | POA: Diagnosis not present

## 2017-12-06 DIAGNOSIS — S3993XA Unspecified injury of pelvis, initial encounter: Secondary | ICD-10-CM | POA: Diagnosis not present

## 2017-12-06 DIAGNOSIS — F0390 Unspecified dementia without behavioral disturbance: Secondary | ICD-10-CM | POA: Insufficient documentation

## 2017-12-06 DIAGNOSIS — R296 Repeated falls: Secondary | ICD-10-CM

## 2017-12-06 DIAGNOSIS — I481 Persistent atrial fibrillation: Secondary | ICD-10-CM | POA: Diagnosis not present

## 2017-12-06 DIAGNOSIS — Z87891 Personal history of nicotine dependence: Secondary | ICD-10-CM

## 2017-12-06 DIAGNOSIS — S299XXA Unspecified injury of thorax, initial encounter: Secondary | ICD-10-CM | POA: Diagnosis not present

## 2017-12-06 DIAGNOSIS — M8008XA Age-related osteoporosis with current pathological fracture, vertebra(e), initial encounter for fracture: Secondary | ICD-10-CM | POA: Diagnosis not present

## 2017-12-06 DIAGNOSIS — Z96652 Presence of left artificial knee joint: Secondary | ICD-10-CM

## 2017-12-06 DIAGNOSIS — K760 Fatty (change of) liver, not elsewhere classified: Secondary | ICD-10-CM | POA: Diagnosis not present

## 2017-12-06 DIAGNOSIS — Z950 Presence of cardiac pacemaker: Secondary | ICD-10-CM | POA: Insufficient documentation

## 2017-12-06 DIAGNOSIS — R58 Hemorrhage, not elsewhere classified: Secondary | ICD-10-CM | POA: Diagnosis not present

## 2017-12-06 DIAGNOSIS — F0391 Unspecified dementia with behavioral disturbance: Secondary | ICD-10-CM | POA: Diagnosis not present

## 2017-12-06 DIAGNOSIS — S2231XA Fracture of one rib, right side, initial encounter for closed fracture: Secondary | ICD-10-CM | POA: Diagnosis not present

## 2017-12-06 DIAGNOSIS — S0003XA Contusion of scalp, initial encounter: Secondary | ICD-10-CM | POA: Diagnosis not present

## 2017-12-06 DIAGNOSIS — M80032A Age-related osteoporosis with current pathological fracture, left forearm, initial encounter for fracture: Secondary | ICD-10-CM | POA: Diagnosis not present

## 2017-12-06 LAB — CBC WITH DIFFERENTIAL/PLATELET
BASOS ABS: 0 10*3/uL (ref 0.0–0.1)
Basophils Relative: 0 %
Eosinophils Absolute: 0.4 10*3/uL (ref 0.0–0.7)
Eosinophils Relative: 5 %
HEMATOCRIT: 36.6 % — AB (ref 39.0–52.0)
Hemoglobin: 11.9 g/dL — ABNORMAL LOW (ref 13.0–17.0)
LYMPHS ABS: 1.3 10*3/uL (ref 0.7–4.0)
LYMPHS PCT: 14 %
MCH: 28.1 pg (ref 26.0–34.0)
MCHC: 32.5 g/dL (ref 30.0–36.0)
MCV: 86.5 fL (ref 78.0–100.0)
Monocytes Absolute: 1.1 10*3/uL — ABNORMAL HIGH (ref 0.1–1.0)
Monocytes Relative: 11 %
NEUTROS ABS: 6.5 10*3/uL (ref 1.7–7.7)
Neutrophils Relative %: 70 %
Platelets: 320 10*3/uL (ref 150–400)
RBC: 4.23 MIL/uL (ref 4.22–5.81)
RDW: 15.7 % — ABNORMAL HIGH (ref 11.5–15.5)
WBC: 9.4 10*3/uL (ref 4.0–10.5)

## 2017-12-06 LAB — I-STAT TROPONIN, ED: TROPONIN I, POC: 0.01 ng/mL (ref 0.00–0.08)

## 2017-12-06 LAB — COMPREHENSIVE METABOLIC PANEL
ALT: 15 U/L (ref 0–44)
AST: 18 U/L (ref 15–41)
Albumin: 3.6 g/dL (ref 3.5–5.0)
Alkaline Phosphatase: 117 U/L (ref 38–126)
Anion gap: 12 (ref 5–15)
BILIRUBIN TOTAL: 0.7 mg/dL (ref 0.3–1.2)
BUN: 19 mg/dL (ref 8–23)
CO2: 27 mmol/L (ref 22–32)
CREATININE: 0.84 mg/dL (ref 0.61–1.24)
Calcium: 9.1 mg/dL (ref 8.9–10.3)
Chloride: 99 mmol/L (ref 98–111)
Glucose, Bld: 88 mg/dL (ref 70–99)
Potassium: 4.2 mmol/L (ref 3.5–5.1)
Sodium: 138 mmol/L (ref 135–145)
TOTAL PROTEIN: 7.3 g/dL (ref 6.5–8.1)

## 2017-12-06 LAB — SEDIMENTATION RATE: SED RATE: 34 mm/h — AB (ref 0–20)

## 2017-12-06 MED ORDER — BACITRACIN ZINC 500 UNIT/GM EX OINT: 1.0000 "application " | TOPICAL_OINTMENT | Freq: Two times a day (BID) | CUTANEOUS | 0 refills | Status: DC

## 2017-12-06 MED ORDER — BACITRACIN ZINC 500 UNIT/GM EX OINT
TOPICAL_OINTMENT | CUTANEOUS | Status: AC
  Administered 2017-12-06: 1
  Filled 2017-12-06: qty 0.9

## 2017-12-06 NOTE — Progress Notes (Signed)
Gastrointest

## 2017-12-06 NOTE — Patient Instructions (Signed)
Labs and GI pathogen.

## 2017-12-06 NOTE — Discharge Instructions (Signed)
Apply bacitracin to the left elbow skin tear twice daily. Change dressing when you do that.   You might have a small left elbow fracture. Please keep the splint on until you see orthopedic doctor  You have scalp laceration and staples were placed. Staples need to be removed in a week.  We sent off GI pathogen panel. You can view the results in 2 days on My Chart.   See ortho and GI doctor  Return to ER if you have worse skin infection, headaches, vomiting, falls, dehydration

## 2017-12-06 NOTE — ED Notes (Signed)
While obtaining report from [place], Dr D. Darl Householder went to bedside. He stapled patients laceration with Hassell Done, NT at bedside. Robbin, RN (primary nurse) has been given report.

## 2017-12-06 NOTE — ED Triage Notes (Signed)
Patient is from [place] at [facility] and transported via [facility]. Per staff to EMS, patient had a witnessed fall from tripping. He has a laceration and hematoma to the back of his head. Also, he is alert and at baseline per facility. Denies any LOC.

## 2017-12-06 NOTE — ED Notes (Signed)
Pt's daughters are taking pt back to nursing home. Discharge papers were given to daughters.

## 2017-12-06 NOTE — ED Provider Notes (Signed)
[facility] DEPT Provider Note   CSN: [id number] Arrival date & time: 12/06/17  1556     History   Chief Complaint Chief Complaint  Patient presents with  . Fall    HPI [name] is a 83 y.o. male hx of CAD, COPD, dementia from a nursing home here presenting with fall.  Patient is currently residing at [facility] and apparently tripped over something and fell backwards.  He apparently had multiple falls since yesterday and had worsening bruising of his face.  He was sent here today because of head injury and persistent bleeding.  Of note, patient was seen in the ED about a week ago for fall.  Tetanus is up-to-date.  The history is provided by the patient and the EMS personnel.   Level V caveat- dementia   Past Medical History:  Diagnosis Date  . Anxiety   . Arthritis   . CAD (coronary artery disease)    a.  cath 5/12: mLAD 50% then tandem 50%, dLAD 30%; D1 30%; OM1 occluded with collateral flow; pRCA 25%; EF 55% with inf HK;  occluded OM likely cause of perfusion defect on Myoview  . Colitis   . COPD (chronic obstructive pulmonary disease) ([place])   . Dementia    short term  . Depression   . Dizziness   . Dyslipidemia   . GERD (gastroesophageal reflux disease)   . Gout   . Hearing disorder   . Hyperlipidemia   . Hypertension   . Hypothyroidism   . Nonspecific abnormal unspecified cardiovascular function study   . Pacemaker   . Pelvic fracture ([place])   . Persistent atrial fibrillation ([place])   . Skin disorder    blister formation  . Tachy-brady syndrome ([place])    s/p pacer  . Ulcerative colitis     Patient Active Problem List   Diagnosis Date Noted  . Adult failure to thrive 11/19/2016  . Hoarseness 02/06/2016  . COPD mixed type ([place]) 08/07/2014  . H/O asbestos exposure 08/07/2014  . Chest pain 07/06/2014  . Coronary artery disease involving native coronary artery of native heart with other form of angina pectoris ([place])   .  Anxiety 01/19/2012  . Skin rash 06/08/2011  . Fatigue 12/02/2010  . Coronary atherosclerosis of native coronary artery 10/01/2010  . ARTHRITIS, RIGHT KNEE 09/30/2010  . Radicular leg pain 09/30/2010  . Abnormal cardiovascular function study 09/09/2010  . OTHER MONONEURITIS OF LOWER LIMB 07/16/2010  . UNSPECIFIED MONONEURITIS OF LOWER LIMB 06/05/2010  . ARTHRITIS, RIGHT KNEE 06/05/2010  . LEG PAIN 06/05/2010  . Hyperlipidemia 11/19/2008  . HTN (hypertension) 11/19/2008  . Atrial fibrillation ([place]) 11/19/2008  . BRADYCARDIA-TACHYCARDIA SYNDROME 11/19/2008  . ULCERATIVE COLITIS 11/19/2008  . DIZZINESS 11/19/2008  . PPM-Medtronic 11/19/2008    Past Surgical History:  Procedure Laterality Date  . BACK SURGERY  1975  . CARDIAC CATHETERIZATION  09/20/10   EF 55%.....Dr. Percival Spanish  . CATARACT EXTRACTION Right   . CATARACT EXTRACTION W/PHACO Left 12/17/2014   Procedure: CATARACT EXTRACTION PHACO AND INTRAOCULAR LENS PLACEMENT (IOC);  Surgeon: Tonny Branch, MD;  Location: [facility];  Service: Ophthalmology;  Laterality: Left;  CDE 7.44  . EP IMPLANTABLE DEVICE N/A 04/02/2015   Procedure:  PPM Generator Changeout;  Surgeon: Thompson Grayer, MD;  Location: [facility] CV LAB;  Service: Cardiovascular;  Laterality: N/A;  . Implantation of a Medtronic dual chamber pacemaker  02/01/06   [name] [name] MD  . Randolm Idol / REPLACE / REMOVE  PACEMAKER     MEDTRONIC....PPM Serial Number:  [id number].....Marland KitchenPPM Model Number:  [id number]....Marland KitchenPPM DOI:  02/01/2006    . KNEE SURGERY  2005   replacement  . LEFT HEART CATHETERIZATION WITH CORONARY ANGIOGRAM N/A 07/06/2014   Procedure: LEFT HEART CATHETERIZATION WITH CORONARY ANGIOGRAM;  Surgeon: Sinclair Grooms, MD;  Location: [facility] CATH LAB;  Service: Cardiovascular;  Laterality: N/A;  . Left knee arthroscopy with debridement  04/23/2006   [name] M.D  . Left knee total knee  replacement  05/13/07   [name] M.D.  . LUMBAR SPINE SURGERY          Home  Medications    Prior to Admission medications   Medication Sig Start Date End Date Taking? Authorizing Provider  ALPRAZolam (XANAX) 0.25 MG tablet Take 0.25 mg by mouth 3 (three) times daily as needed for anxiety (agitation (control)).    Yes [provider]  aspirin EC 81 MG tablet Take 81 mg by mouth daily.   Yes [provider]  cetaphil (CETAPHIL) cream Apply topically daily.   Yes [provider]  cholecalciferol (VITAMIN D) 1000 units tablet Take 1 capsule by mouth daily with supper.    Yes [provider]  cholestyramine (QUESTRAN) 4 g packet Take 2 g by mouth. M-W-F at bedtime   Yes [provider]  levothyroxine (SYNTHROID, LEVOTHROID) 50 MCG tablet Take 50 mcg by mouth daily before breakfast.  09/11/13  Yes [provider]  loperamide (IMODIUM A-D) 2 MG tablet Take 2 mg by mouth 4 (four) times daily as needed for diarrhea or loose stools.   Yes [provider]  Melatonin 1 MG CAPS Take 3 mg by mouth.   Yes [provider]  metoprolol tartrate (LOPRESSOR) 50 MG tablet TAKE ONE TABLET BY MOUTH TWICE A DAY 07/05/17  Yes Herminio Commons, MD  sertraline (ZOLOFT) 50 MG tablet Take 50 mg by mouth daily.   Yes [provider]  Vitamin D, Ergocalciferol, (DRISDOL) 50000 units CAPS capsule Take 50,000 Units by mouth every 7 (seven) days.   Yes [provider]    Family History Family History  Problem Relation Age of Onset  . Lung disease Father        Emphysema  . Hypertension Father   . Peptic Ulcer Disease Father   . COPD Father   . Hypertension Mother   . Liver cancer Mother   . Pernicious anemia Mother   . Depression Mother   . Kidney disease Mother   . Colitis Mother   . Asthma Sister   . Healthy Daughter   . Healthy Daughter   . Healthy Son   . COPD Sister   . Glaucoma Sister   . Hypertension Sister   . Cancer Sister     Social History Social History   Tobacco Use  . Smoking  status: Former Smoker    Packs/day: 1.00    Years: 15.00    Pack years: 15.00    Types: Cigarettes    Start date: 05/13/1955    Last attempt to quit: 05/12/1983    Years since quitting: 34.5  . Smokeless tobacco: Never Used  Substance Use Topics  . Alcohol use: No    Alcohol/week: 0.0 oz  . Drug use: No     Allergies   Nsaids   Review of Systems Review of Systems  Skin: Positive for wound.  All other systems reviewed and are negative.    Physical Exam Updated  Vital Signs BP (!) 151/100 (BP Location: Right Arm)   Pulse 81   Temp 98 F (36.7 C) (Oral)   Resp (!) 22   SpO2 96%   Physical Exam  Constitutional:  Chronically ill, demented   HENT:  4 cm laceration posterior scalp with hematoma. Ecchymosis L face with no obvious laceration   Eyes: Pupils are equal, round, and reactive to light. Conjunctivae and EOM are normal.  Neck: Normal range of motion. Neck supple.  Cardiovascular: Normal rate, regular rhythm and normal heart sounds.  Pulmonary/Chest: Effort normal and breath sounds normal. No stridor. No respiratory distress. He has no wheezes.  Abdominal: Soft. Bowel sounds are normal. He exhibits no distension. There is no tenderness.  Musculoskeletal:  Skin tear anterior aspect L elbow with some redness, able to range the L elbow. No obvious midline spinal tenderness. Nl ROM bilateral hips   Neurological: He is alert.  Skin: Skin is warm.  Psychiatric: He has a normal mood and affect.  Nursing note and vitals reviewed.      ED Treatments / Results  Labs (all labs ordered are listed, but only abnormal results are displayed) Labs Reviewed  CBC WITH DIFFERENTIAL/PLATELET - Abnormal; Notable for the following components:      Result Value   Hemoglobin 11.9 (*)    HCT 36.6 (*)    RDW 15.7 (*)    Monocytes Absolute 1.1 (*)    All other components within normal limits  C DIFFICILE QUICK SCREEN W PCR REFLEX  GASTROINTESTINAL PANEL BY PCR, STOOL (REPLACES  STOOL CULTURE)  COMPREHENSIVE METABOLIC PANEL  I-STAT TROPONIN, ED    EKG None  ED ECG REPORT   Date: 12/06/2017  Rate: 69  Rhythm: atrial flutter  QRS Axis: normal  Intervals: normal  ST/T Wave abnormalities: nonspecific ST changes  Conduction Disutrbances:left bundle branch block  Narrative Interpretation:   Old EKG Reviewed: unchanged  I have personally reviewed the EKG tracing and agree with the computerized printout as noted.   Radiology Dg Chest 1 View  Result Date: 12/06/2017 CLINICAL DATA:  Fall EXAM: CHEST  1 VIEW COMPARISON:  05/24/2016 FINDINGS: Left-sided pacing device as before. Mild cardiomegaly with aortic atherosclerosis. No pneumothorax. IMPRESSION: No active disease.  Mild cardiomegaly Electronically Signed   By: Donavan Foil M.D.   On: 12/06/2017 17:15   Dg Pelvis 1-2 Views  Result Date: 12/06/2017 CLINICAL DATA:  Fall with pain EXAM: PELVIS - 1-2 VIEW COMPARISON:  None. FINDINGS: Scoliosis and degenerative changes of the lumbar spine. Mild SI joint degenerative change. Pubic symphysis and rami are intact. No fracture or dislocation. Mild degenerative changes of both hips. IMPRESSION: No definite acute osseous abnormality. Electronically Signed   By: Donavan Foil M.D.   On: 12/06/2017 17:31   Dg Elbow Complete Left  Result Date: 12/06/2017 CLINICAL DATA:  Recent fall EXAM: LEFT ELBOW - COMPLETE 3+ VIEW COMPARISON:  None. FINDINGS: Elbow effusion is present. Possible cortical step-off at the femoral head neck junction. No dislocation evident. IMPRESSION: Elbow effusion with possible subtle nondisplaced fracture at the junction of the radial head and neck. Electronically Signed   By: Donavan Foil M.D.   On: 12/06/2017 17:30   Ct Head Wo Contrast  Result Date: 12/06/2017 CLINICAL DATA:  Facial trauma with fracture suspected. Fall with head hematoma. Initial encounter. EXAM: CT HEAD WITHOUT CONTRAST CT MAXILLOFACIAL WITHOUT CONTRAST CT CERVICAL SPINE WITHOUT  CONTRAST TECHNIQUE: Multidetector CT imaging of the head, cervical spine, and maxillofacial structures were performed  using the standard protocol without intravenous contrast. Multiplanar CT image reconstructions of the cervical spine and maxillofacial structures were also generated. COMPARISON:  Head and cervical spine CT 6 days ago FINDINGS: CT HEAD FINDINGS Brain: Advanced atrophy, especially in the medial temporal lobes. Patient has history of dementia and this pattern favors Alzheimer's disease. There is also extensive chronic small vessel ischemia in the cerebral white matter with confluent low-density. No evidence of hemorrhage, contusion, or acute infarct. Vascular: Extensive atherosclerotic calcification Skull: Negative for fracture. New posterior scalp contusion with staples. Decreasing right forehead contusion. CT MAXILLOFACIAL FINDINGS Osseous: Negative for acute fracture or mandibular dislocation. A lucency on sagittal reformats through the subcondylar right mandible is likely an old fracture. The appearance is stable from comparison cervical spine CT and is incomplete on coronal reformats. Orbits: No evidence of injury.  Bilateral cataract resection. Sinuses: No evidence of injury Soft tissues: Right forehead contusion that has diminished from recent scan. CT CERVICAL SPINE FINDINGS Alignment: No traumatic malalignment Skull base and vertebrae: Negative for fracture Soft tissues and spinal canal: Soft tissue stranding deep to the upper left sternocleidomastoid, new. Disc levels: Diffuse degenerative disc narrowing and endplate ridging. Facet arthropathy with ankylosis on the left at C2-3. Upper chest: Negative IMPRESSION: 1. No evidence of acute intracranial injury. 2. Posterior scalp contusion without calvarial fracture. 3. Left lateral neck soft tissue contusion without cervical spine fracture. 4. Negative for acute facial fracture. 5. Decreasing right forehead hematoma compared to CT 6 days ago. 6.  Advanced atrophy and chronic small vessel ischemia. Electronically Signed   By: Monte Fantasia M.D.   On: 12/06/2017 18:32   Ct Cervical Spine Wo Contrast  Result Date: 12/06/2017 CLINICAL DATA:  Facial trauma with fracture suspected. Fall with head hematoma. Initial encounter. EXAM: CT HEAD WITHOUT CONTRAST CT MAXILLOFACIAL WITHOUT CONTRAST CT CERVICAL SPINE WITHOUT CONTRAST TECHNIQUE: Multidetector CT imaging of the head, cervical spine, and maxillofacial structures were performed using the standard protocol without intravenous contrast. Multiplanar CT image reconstructions of the cervical spine and maxillofacial structures were also generated. COMPARISON:  Head and cervical spine CT 6 days ago FINDINGS: CT HEAD FINDINGS Brain: Advanced atrophy, especially in the medial temporal lobes. Patient has history of dementia and this pattern favors Alzheimer's disease. There is also extensive chronic small vessel ischemia in the cerebral white matter with confluent low-density. No evidence of hemorrhage, contusion, or acute infarct. Vascular: Extensive atherosclerotic calcification Skull: Negative for fracture. New posterior scalp contusion with staples. Decreasing right forehead contusion. CT MAXILLOFACIAL FINDINGS Osseous: Negative for acute fracture or mandibular dislocation. A lucency on sagittal reformats through the subcondylar right mandible is likely an old fracture. The appearance is stable from comparison cervical spine CT and is incomplete on coronal reformats. Orbits: No evidence of injury.  Bilateral cataract resection. Sinuses: No evidence of injury Soft tissues: Right forehead contusion that has diminished from recent scan. CT CERVICAL SPINE FINDINGS Alignment: No traumatic malalignment Skull base and vertebrae: Negative for fracture Soft tissues and spinal canal: Soft tissue stranding deep to the upper left sternocleidomastoid, new. Disc levels: Diffuse degenerative disc narrowing and endplate  ridging. Facet arthropathy with ankylosis on the left at C2-3. Upper chest: Negative IMPRESSION: 1. No evidence of acute intracranial injury. 2. Posterior scalp contusion without calvarial fracture. 3. Left lateral neck soft tissue contusion without cervical spine fracture. 4. Negative for acute facial fracture. 5. Decreasing right forehead hematoma compared to CT 6 days ago. 6. Advanced atrophy and chronic small vessel ischemia. Electronically  Signed   By: Monte Fantasia M.D.   On: 12/06/2017 18:32   Ct Maxillofacial Wo Contrast  Result Date: 12/06/2017 CLINICAL DATA:  Facial trauma with fracture suspected. Fall with head hematoma. Initial encounter. EXAM: CT HEAD WITHOUT CONTRAST CT MAXILLOFACIAL WITHOUT CONTRAST CT CERVICAL SPINE WITHOUT CONTRAST TECHNIQUE: Multidetector CT imaging of the head, cervical spine, and maxillofacial structures were performed using the standard protocol without intravenous contrast. Multiplanar CT image reconstructions of the cervical spine and maxillofacial structures were also generated. COMPARISON:  Head and cervical spine CT 6 days ago FINDINGS: CT HEAD FINDINGS Brain: Advanced atrophy, especially in the medial temporal lobes. Patient has history of dementia and this pattern favors Alzheimer's disease. There is also extensive chronic small vessel ischemia in the cerebral white matter with confluent low-density. No evidence of hemorrhage, contusion, or acute infarct. Vascular: Extensive atherosclerotic calcification Skull: Negative for fracture. New posterior scalp contusion with staples. Decreasing right forehead contusion. CT MAXILLOFACIAL FINDINGS Osseous: Negative for acute fracture or mandibular dislocation. A lucency on sagittal reformats through the subcondylar right mandible is likely an old fracture. The appearance is stable from comparison cervical spine CT and is incomplete on coronal reformats. Orbits: No evidence of injury.  Bilateral cataract resection. Sinuses:  No evidence of injury Soft tissues: Right forehead contusion that has diminished from recent scan. CT CERVICAL SPINE FINDINGS Alignment: No traumatic malalignment Skull base and vertebrae: Negative for fracture Soft tissues and spinal canal: Soft tissue stranding deep to the upper left sternocleidomastoid, new. Disc levels: Diffuse degenerative disc narrowing and endplate ridging. Facet arthropathy with ankylosis on the left at C2-3. Upper chest: Negative IMPRESSION: 1. No evidence of acute intracranial injury. 2. Posterior scalp contusion without calvarial fracture. 3. Left lateral neck soft tissue contusion without cervical spine fracture. 4. Negative for acute facial fracture. 5. Decreasing right forehead hematoma compared to CT 6 days ago. 6. Advanced atrophy and chronic small vessel ischemia. Electronically Signed   By: Monte Fantasia M.D.   On: 12/06/2017 18:32    Procedures Procedures (including critical care time)  LACERATION REPAIR Performed by: Wandra Arthurs Authorized by: Wandra Arthurs Consent: Verbal consent obtained. Risks and benefits: risks, benefits and alternatives were discussed Consent given by: patient Patient identity confirmed: provided demographic data Prepped and Draped in normal sterile fashion Wound explored  Laceration Location: posterior scalp   Laceration Length: 4 cm  No Foreign Bodies seen or palpated  Anesthesia: none   Local anesthetic: none   Irrigation method: syringe Amount of cleaning: standard  Skin closure: staples  Number of staples: 4   Patient tolerance: Patient tolerated the procedure well with no immediate complications.   Medications Ordered in ED Medications  bacitracin 500 UNIT/GM ointment (1 application  Given 0/94/70 1913)     Initial Impression / Assessment and Plan / ED Course  I have reviewed the triage vital signs and the nursing notes.  Pertinent labs & imaging results that were available during my care of the patient  were reviewed by me and considered in my medical decision making (see chart for details).    [name] is a 83 y.o. male here with fall. Likely mechanical fall in a nursing home. Patient demented and unable to give much history. Given that he has been falling often, will get labs, EKG, CT head/neck/face.   7:15 PM CT showed no fractures or bleed. WBC nl. He has L elbow skin tear with some mild cellulitis. Xray showed possible nondisplaced fracture. Posterior  elbow splint placed. I applied bacitracin and petroleum dressing and Kerlix on. Daughters at bedside. He apparently has diarrhea recently and just saw GI today and C diff was considered. He had an episode of diarrhea so I sent off C diff, GI pathogen panel. Due to concern for possible C diff, I want to hold off on oral abx for his infected skin tear. Will try bacitracin ointment, refer to ortho for elbow fracture. Stable for dc back to facility.    Final Clinical Impressions(s) / ED Diagnoses   Final diagnoses:  None    ED Discharge Orders    None       Drenda Freeze, MD 12/06/17 [phone]

## 2017-12-06 NOTE — ED Notes (Addendum)
Amy from lab called to let this writer know that the stool sample collected was too formed to test for C-Diff and did not meet the guidelines.

## 2017-12-06 NOTE — ED Notes (Signed)
Bed: GY56 Expected date:  Expected time:  Means of arrival:  Comments: EMS-fall-head hematoma

## 2017-12-06 NOTE — Progress Notes (Addendum)
Subjective:    Patient ID: [name], male    DOB: December 04, 1934, 83 y.o.   MRN: [id number] Wt 149 in March. Ht 72 HPIHere today for f/u. Last seen by Dr. Laural Golden in March of t his year. Hx of UC. Not taking methotrexate. Was on Methotrexate for a skin disorder. Has been off a little over a year.  He is off the oral mesalamine.Has been off the Mesalamine for years  His last colonoscopy was in 2011 with active colitis involving the sigmoid colon and distal rectum. Most of the rectum is spared. One polyp. Recently experienced a fall striking his head on the floor. Seen at [facility]. Living at the [facility] memory care center in [facility].  Has been having diarrhea since the end of March.  Thought maybe it was the food, but no change. Daughter thought it may the Depakote and the Depakote was stopped. Having diarrhea daily and has been incontinent of urine and bowel.  No BRRB.   His appetite is okay. Has lost about 4 pounds since his visit in March.  No recent antibiotics.  Review of Systems Past Medical History:  Diagnosis Date  . Anxiety   . Arthritis   . CAD (coronary artery disease)    a.  cath 5/12: mLAD 50% then tandem 50%, dLAD 30%; D1 30%; OM1 occluded with collateral flow; pRCA 25%; EF 55% with inf HK;  occluded OM likely cause of perfusion defect on Myoview  . Colitis   . COPD (chronic obstructive pulmonary disease) ([place])   . Dementia    short term  . Depression   . Dizziness   . Dyslipidemia   . GERD (gastroesophageal reflux disease)   . Gout   . Hearing disorder   . Hyperlipidemia   . Hypertension   . Hypothyroidism   . Nonspecific abnormal unspecified cardiovascular function study   . Pacemaker   . Pelvic fracture ([place])   . Persistent atrial fibrillation ([place])   . Skin disorder    blister formation  . Tachy-brady syndrome ([place])    s/p pacer  . Ulcerative colitis     Past Surgical History:  Procedure Laterality Date  . BACK SURGERY  1975  . CARDIAC CATHETERIZATION   09/20/10   EF 55%.....Dr. Percival Spanish  . CATARACT EXTRACTION Right   . CATARACT EXTRACTION W/PHACO Left 12/17/2014   Procedure: CATARACT EXTRACTION PHACO AND INTRAOCULAR LENS PLACEMENT (IOC);  Surgeon: Tonny Branch, MD;  Location: [facility];  Service: Ophthalmology;  Laterality: Left;  CDE 7.44  . EP IMPLANTABLE DEVICE N/A 04/02/2015   Procedure:  PPM Generator Changeout;  Surgeon: Thompson Grayer, MD;  Location: [place] CV LAB;  Service: Cardiovascular;  Laterality: N/A;  . Implantation of a Medtronic dual chamber pacemaker  02/01/06   [name] [name] MD  . Randolm Idol / REPLACE / REMOVE PACEMAKER     MEDTRONIC....PPM Serial Number:  [id number].....Marland KitchenPPM Model Number:  [id number]....Marland KitchenPPM DOI:  02/01/2006    . KNEE SURGERY  2005   replacement  . LEFT HEART CATHETERIZATION WITH CORONARY ANGIOGRAM N/A 07/06/2014   Procedure: LEFT HEART CATHETERIZATION WITH CORONARY ANGIOGRAM;  Surgeon: Sinclair Grooms, MD;  Location: [facility] CATH LAB;  Service: Cardiovascular;  Laterality: N/A;  . Left knee arthroscopy with debridement  04/23/2006   [name] M.D  . Left knee total knee  replacement  05/13/07   [name] M.D.  . LUMBAR SPINE SURGERY      Allergies  Allergen Reactions  .  Nsaids     Colitis, upset stomach    Current Outpatient Medications on File Prior to Visit  Medication Sig Dispense Refill  . ALPRAZolam (XANAX) 0.25 MG tablet Take 0.25 mg by mouth at bedtime as needed for anxiety.    Marland Kitchen aspirin EC 81 MG tablet Take 81 mg by mouth daily.    . cholecalciferol (VITAMIN D) 1000 units tablet Take 1 capsule by mouth daily with supper.     . cholestyramine (QUESTRAN) 4 g packet Take 2 g by mouth. M-W-F    . levothyroxine (SYNTHROID, LEVOTHROID) 50 MCG tablet Take 50 mcg by mouth daily before breakfast.     . Melatonin 1 MG CAPS Take 3 mg by mouth.    . Melatonin-Pyridoxine (MELATIN PO) Take 3 mg by mouth at bedtime.    . metoprolol tartrate (LOPRESSOR) 50 MG tablet TAKE ONE TABLET BY MOUTH TWICE A  DAY 180 tablet 0  . NON FORMULARY Mindstill one a day.    . sertraline (ZOLOFT) 50 MG tablet Take 50 mg by mouth daily.     No current facility-administered medications on file prior to visit.         Objective:   Physical Exam Blood pressure (!) 144/80, pulse 72, temperature 98 F (36.7 C), height 6' 1"  (1.854 m), weight 144 lb 14.4 oz (65.7 kg). Alert and oriented. Skin warm and dry. Oral mucosa is moist.   . Sclera anicteric, conjunctivae is pink. Thyroid not enlarged. No cervical lymphadenopathy. Lungs clear. Heart regular rate and rhythm.  Abdomen is soft. Bowel sounds are positive. No hepatomegaly. No abdominal masses felt. No tenderness.  No edema to lower extremities.    Bruising across face from a recent fall.         Assessment & Plan:  Diarrhea,. Possible UC flare.  Am going to get a sedrate and GI pathogen.  Further recommendations to follow.

## 2017-12-07 ENCOUNTER — Emergency Department (HOSPITAL_COMMUNITY): Payer: Medicare Other

## 2017-12-07 ENCOUNTER — Encounter (HOSPITAL_COMMUNITY): Payer: Self-pay | Admitting: Internal Medicine

## 2017-12-07 ENCOUNTER — Inpatient Hospital Stay (HOSPITAL_COMMUNITY)
Admission: EM | Admit: 2017-12-07 | Discharge: 2017-12-10 | DRG: 386 | Disposition: A | Discharge status: Skilled Nursing Facility | Payer: Medicare Other | Source: Skilled Nursing Facility | Attending: Internal Medicine | Admitting: Internal Medicine

## 2017-12-07 DIAGNOSIS — S2231XA Fracture of one rib, right side, initial encounter for closed fracture: Secondary | ICD-10-CM

## 2017-12-07 DIAGNOSIS — S2231XD Fracture of one rib, right side, subsequent encounter for fracture with routine healing: Secondary | ICD-10-CM | POA: Diagnosis not present

## 2017-12-07 DIAGNOSIS — R404 Transient alteration of awareness: Secondary | ICD-10-CM | POA: Diagnosis not present

## 2017-12-07 DIAGNOSIS — R32 Unspecified urinary incontinence: Secondary | ICD-10-CM | POA: Diagnosis present

## 2017-12-07 DIAGNOSIS — E039 Hypothyroidism, unspecified: Secondary | ICD-10-CM | POA: Diagnosis present

## 2017-12-07 DIAGNOSIS — W19XXXA Unspecified fall, initial encounter: Secondary | ICD-10-CM | POA: Diagnosis present

## 2017-12-07 DIAGNOSIS — J449 Chronic obstructive pulmonary disease, unspecified: Secondary | ICD-10-CM | POA: Diagnosis present

## 2017-12-07 DIAGNOSIS — E785 Hyperlipidemia, unspecified: Secondary | ICD-10-CM | POA: Diagnosis not present

## 2017-12-07 DIAGNOSIS — K51818 Other ulcerative colitis with other complication: Secondary | ICD-10-CM | POA: Diagnosis not present

## 2017-12-07 DIAGNOSIS — I2510 Atherosclerotic heart disease of native coronary artery without angina pectoris: Secondary | ICD-10-CM | POA: Diagnosis present

## 2017-12-07 DIAGNOSIS — Z818 Family history of other mental and behavioral disorders: Secondary | ICD-10-CM

## 2017-12-07 DIAGNOSIS — S52125D Nondisplaced fracture of head of left radius, subsequent encounter for closed fracture with routine healing: Secondary | ICD-10-CM | POA: Diagnosis not present

## 2017-12-07 DIAGNOSIS — S0990XA Unspecified injury of head, initial encounter: Secondary | ICD-10-CM | POA: Diagnosis not present

## 2017-12-07 DIAGNOSIS — M1990 Unspecified osteoarthritis, unspecified site: Secondary | ICD-10-CM | POA: Diagnosis present

## 2017-12-07 DIAGNOSIS — Z961 Presence of intraocular lens: Secondary | ICD-10-CM | POA: Diagnosis present

## 2017-12-07 DIAGNOSIS — K5900 Constipation, unspecified: Secondary | ICD-10-CM | POA: Diagnosis not present

## 2017-12-07 DIAGNOSIS — S199XXA Unspecified injury of neck, initial encounter: Secondary | ICD-10-CM | POA: Diagnosis not present

## 2017-12-07 DIAGNOSIS — R296 Repeated falls: Secondary | ICD-10-CM | POA: Diagnosis present

## 2017-12-07 DIAGNOSIS — M109 Gout, unspecified: Secondary | ICD-10-CM | POA: Diagnosis present

## 2017-12-07 DIAGNOSIS — Y92129 Unspecified place in nursing home as the place of occurrence of the external cause: Secondary | ICD-10-CM

## 2017-12-07 DIAGNOSIS — K219 Gastro-esophageal reflux disease without esophagitis: Secondary | ICD-10-CM | POA: Diagnosis present

## 2017-12-07 DIAGNOSIS — K8020 Calculus of gallbladder without cholecystitis without obstruction: Secondary | ICD-10-CM | POA: Diagnosis not present

## 2017-12-07 DIAGNOSIS — Z9841 Cataract extraction status, right eye: Secondary | ICD-10-CM

## 2017-12-07 DIAGNOSIS — S2241XA Multiple fractures of ribs, right side, initial encounter for closed fracture: Secondary | ICD-10-CM | POA: Diagnosis not present

## 2017-12-07 DIAGNOSIS — Z96652 Presence of left artificial knee joint: Secondary | ICD-10-CM | POA: Diagnosis present

## 2017-12-07 DIAGNOSIS — R0781 Pleurodynia: Secondary | ICD-10-CM | POA: Diagnosis not present

## 2017-12-07 DIAGNOSIS — Z83511 Family history of glaucoma: Secondary | ICD-10-CM

## 2017-12-07 DIAGNOSIS — K5190 Ulcerative colitis, unspecified, without complications: Secondary | ICD-10-CM | POA: Diagnosis not present

## 2017-12-07 DIAGNOSIS — Z950 Presence of cardiac pacemaker: Secondary | ICD-10-CM

## 2017-12-07 DIAGNOSIS — F0390 Unspecified dementia without behavioral disturbance: Secondary | ICD-10-CM | POA: Diagnosis present

## 2017-12-07 DIAGNOSIS — I495 Sick sinus syndrome: Secondary | ICD-10-CM | POA: Diagnosis not present

## 2017-12-07 DIAGNOSIS — Z77090 Contact with and (suspected) exposure to asbestos: Secondary | ICD-10-CM | POA: Diagnosis present

## 2017-12-07 DIAGNOSIS — Z79890 Hormone replacement therapy (postmenopausal): Secondary | ICD-10-CM | POA: Diagnosis not present

## 2017-12-07 DIAGNOSIS — S0083XD Contusion of other part of head, subsequent encounter: Secondary | ICD-10-CM | POA: Diagnosis not present

## 2017-12-07 DIAGNOSIS — Z841 Family history of disorders of kidney and ureter: Secondary | ICD-10-CM

## 2017-12-07 DIAGNOSIS — M4855XD Collapsed vertebra, not elsewhere classified, thoracolumbar region, subsequent encounter for fracture with routine healing: Secondary | ICD-10-CM | POA: Diagnosis not present

## 2017-12-07 DIAGNOSIS — I10 Essential (primary) hypertension: Secondary | ICD-10-CM | POA: Diagnosis present

## 2017-12-07 DIAGNOSIS — Z8249 Family history of ischemic heart disease and other diseases of the circulatory system: Secondary | ICD-10-CM

## 2017-12-07 DIAGNOSIS — M8008XA Age-related osteoporosis with current pathological fracture, vertebra(e), initial encounter for fracture: Secondary | ICD-10-CM | POA: Diagnosis present

## 2017-12-07 DIAGNOSIS — R159 Full incontinence of feces: Secondary | ICD-10-CM | POA: Diagnosis present

## 2017-12-07 DIAGNOSIS — I481 Persistent atrial fibrillation: Secondary | ICD-10-CM | POA: Diagnosis present

## 2017-12-07 DIAGNOSIS — Z66 Do not resuscitate: Secondary | ICD-10-CM | POA: Diagnosis present

## 2017-12-07 DIAGNOSIS — F419 Anxiety disorder, unspecified: Secondary | ICD-10-CM | POA: Diagnosis present

## 2017-12-07 DIAGNOSIS — Z87891 Personal history of nicotine dependence: Secondary | ICD-10-CM

## 2017-12-07 DIAGNOSIS — I482 Chronic atrial fibrillation: Secondary | ICD-10-CM | POA: Diagnosis present

## 2017-12-07 DIAGNOSIS — W19XXXD Unspecified fall, subsequent encounter: Secondary | ICD-10-CM | POA: Diagnosis not present

## 2017-12-07 DIAGNOSIS — Z886 Allergy status to analgesic agent status: Secondary | ICD-10-CM

## 2017-12-07 DIAGNOSIS — M8088XA Other osteoporosis with current pathological fracture, vertebra(e), initial encounter for fracture: Secondary | ICD-10-CM | POA: Diagnosis not present

## 2017-12-07 DIAGNOSIS — S0083XA Contusion of other part of head, initial encounter: Secondary | ICD-10-CM | POA: Diagnosis present

## 2017-12-07 DIAGNOSIS — S52125A Nondisplaced fracture of head of left radius, initial encounter for closed fracture: Secondary | ICD-10-CM | POA: Diagnosis not present

## 2017-12-07 DIAGNOSIS — Z9842 Cataract extraction status, left eye: Secondary | ICD-10-CM

## 2017-12-07 DIAGNOSIS — S5000XA Contusion of unspecified elbow, initial encounter: Secondary | ICD-10-CM | POA: Diagnosis present

## 2017-12-07 DIAGNOSIS — M80032A Age-related osteoporosis with current pathological fracture, left forearm, initial encounter for fracture: Secondary | ICD-10-CM | POA: Diagnosis present

## 2017-12-07 DIAGNOSIS — K760 Fatty (change of) liver, not elsewhere classified: Secondary | ICD-10-CM | POA: Diagnosis not present

## 2017-12-07 DIAGNOSIS — Z800 Family history of malignant neoplasm of digestive organs: Secondary | ICD-10-CM

## 2017-12-07 DIAGNOSIS — Z7982 Long term (current) use of aspirin: Secondary | ICD-10-CM

## 2017-12-07 DIAGNOSIS — T07XXXA Unspecified multiple injuries, initial encounter: Secondary | ICD-10-CM

## 2017-12-07 DIAGNOSIS — Z825 Family history of asthma and other chronic lower respiratory diseases: Secondary | ICD-10-CM

## 2017-12-07 DIAGNOSIS — S0003XD Contusion of scalp, subsequent encounter: Secondary | ICD-10-CM | POA: Diagnosis not present

## 2017-12-07 DIAGNOSIS — S299XXA Unspecified injury of thorax, initial encounter: Secondary | ICD-10-CM | POA: Diagnosis not present

## 2017-12-07 DIAGNOSIS — I4891 Unspecified atrial fibrillation: Secondary | ICD-10-CM | POA: Diagnosis not present

## 2017-12-07 HISTORY — DX: Low back pain: M54.5

## 2017-12-07 HISTORY — DX: Low back pain, unspecified: M54.50

## 2017-12-07 HISTORY — DX: Other chronic pain: G89.29

## 2017-12-07 HISTORY — DX: Personal history of other diseases of the musculoskeletal system and connective tissue: Z87.39

## 2017-12-07 HISTORY — DX: Pneumonia, unspecified organism: J18.9

## 2017-12-07 LAB — COMPREHENSIVE METABOLIC PANEL
ALBUMIN: 3.2 g/dL — AB (ref 3.5–5.0)
ALK PHOS: 106 U/L (ref 38–126)
ALT: 15 U/L (ref 0–44)
ANION GAP: 9 (ref 5–15)
AST: 19 U/L (ref 15–41)
BILIRUBIN TOTAL: 1.2 mg/dL (ref 0.3–1.2)
BUN: 14 mg/dL (ref 8–23)
CALCIUM: 8.8 mg/dL — AB (ref 8.9–10.3)
CO2: 27 mmol/L (ref 22–32)
Chloride: 101 mmol/L (ref 98–111)
Creatinine, Ser: 0.88 mg/dL (ref 0.61–1.24)
GLUCOSE: 106 mg/dL — AB (ref 70–99)
Potassium: 4.2 mmol/L (ref 3.5–5.1)
Sodium: 137 mmol/L (ref 135–145)
TOTAL PROTEIN: 6.7 g/dL (ref 6.5–8.1)

## 2017-12-07 LAB — GASTROINTESTINAL PANEL BY PCR, STOOL (REPLACES STOOL CULTURE)

## 2017-12-07 LAB — PROTIME-INR
INR: 1.14
Prothrombin Time: 14.5 seconds (ref 11.4–15.2)

## 2017-12-07 LAB — URINALYSIS, ROUTINE W REFLEX MICROSCOPIC
Bilirubin Urine: NEGATIVE
Glucose, UA: NEGATIVE mg/dL
Hgb urine dipstick: NEGATIVE
KETONES UR: NEGATIVE mg/dL
LEUKOCYTES UA: NEGATIVE
Nitrite: NEGATIVE
PH: 6 (ref 5.0–8.0)
Protein, ur: NEGATIVE mg/dL
SPECIFIC GRAVITY, URINE: 1.015 (ref 1.005–1.030)

## 2017-12-07 LAB — CBC WITH DIFFERENTIAL/PLATELET
Abs Immature Granulocytes: 0.1 10*3/uL (ref 0.0–0.1)
Basophils Absolute: 0.1 10*3/uL (ref 0.0–0.1)
Basophils Relative: 1 %
EOS PCT: 2 %
Eosinophils Absolute: 0.2 10*3/uL (ref 0.0–0.7)
HEMATOCRIT: 37.1 % — AB (ref 39.0–52.0)
HEMOGLOBIN: 11.6 g/dL — AB (ref 13.0–17.0)
Immature Granulocytes: 1 %
LYMPHS ABS: 0.9 10*3/uL (ref 0.7–4.0)
LYMPHS PCT: 9 %
MCH: 27.2 pg (ref 26.0–34.0)
MCHC: 31.3 g/dL (ref 30.0–36.0)
MCV: 87.1 fL (ref 78.0–100.0)
MONOS PCT: 9 %
Monocytes Absolute: 1 10*3/uL (ref 0.1–1.0)
Neutro Abs: 8.5 10*3/uL — ABNORMAL HIGH (ref 1.7–7.7)
Neutrophils Relative %: 78 %
Platelets: 330 10*3/uL (ref 150–400)
RBC: 4.26 MIL/uL (ref 4.22–5.81)
RDW: 15.5 % (ref 11.5–15.5)
WBC: 10.8 10*3/uL — ABNORMAL HIGH (ref 4.0–10.5)

## 2017-12-07 LAB — TSH: TSH: 3.064 u[IU]/mL (ref 0.350–4.500)

## 2017-12-07 MED ORDER — FENTANYL CITRATE (PF) 100 MCG/2ML IJ SOLN
25.0000 ug | Freq: Once | INTRAMUSCULAR | Status: AC
Start: 2017-12-07 — End: 2017-12-07
  Administered 2017-12-07: 25 ug via INTRAVENOUS
  Filled 2017-12-07: qty 2

## 2017-12-07 MED ORDER — ALPRAZOLAM 0.25 MG PO TABS
0.2500 mg | ORAL_TABLET | Freq: Three times a day (TID) | ORAL | Status: DC | PRN
  Administered 2017-12-07: 0.25 mg via ORAL
  Filled 2017-12-07: qty 1

## 2017-12-07 MED ORDER — MELATONIN 3 MG PO TABS
3.0000 mg | ORAL_TABLET | Freq: Every day | ORAL | Status: DC
  Administered 2017-12-07 – 2017-12-09 (×3): 3 mg via ORAL
  Filled 2017-12-07 (×4): qty 1

## 2017-12-07 MED ORDER — LEVOTHYROXINE SODIUM 50 MCG PO TABS
50.0000 ug | ORAL_TABLET | Freq: Every day | ORAL | Status: DC
  Administered 2017-12-08 – 2017-12-10 (×3): 50 ug via ORAL
  Filled 2017-12-07 (×3): qty 1

## 2017-12-07 MED ORDER — LOPERAMIDE HCL 2 MG PO CAPS: 2.0000 mg | ORAL_CAPSULE | Freq: Four times a day (QID) | ORAL | Status: DC | PRN

## 2017-12-07 MED ORDER — IOHEXOL 300 MG/ML  SOLN
100.0000 mL | Freq: Once | INTRAMUSCULAR | Status: AC | PRN
  Administered 2017-12-07: 100 mL via INTRAVENOUS

## 2017-12-07 MED ORDER — ACETAMINOPHEN 650 MG RE SUPP: 650.0000 mg | Freq: Four times a day (QID) | RECTAL | Status: DC | PRN

## 2017-12-07 MED ORDER — ACETAMINOPHEN 325 MG PO TABS
650.0000 mg | ORAL_TABLET | Freq: Four times a day (QID) | ORAL | Status: DC | PRN
  Administered 2017-12-09 – 2017-12-10 (×3): 650 mg via ORAL
  Filled 2017-12-07 (×3): qty 2

## 2017-12-07 MED ORDER — METOPROLOL TARTRATE 25 MG PO TABS
50.0000 mg | ORAL_TABLET | Freq: Two times a day (BID) | ORAL | Status: DC
  Administered 2017-12-07 – 2017-12-10 (×6): 50 mg via ORAL
  Filled 2017-12-07 (×7): qty 2

## 2017-12-07 MED ORDER — OXYCODONE HCL 5 MG PO TABS
5.0000 mg | ORAL_TABLET | ORAL | Status: DC | PRN
  Administered 2017-12-07 – 2017-12-10 (×7): 5 mg via ORAL
  Filled 2017-12-07 (×8): qty 1

## 2017-12-07 MED ORDER — ASPIRIN EC 81 MG PO TBEC
81.0000 mg | DELAYED_RELEASE_TABLET | Freq: Every day | ORAL | Status: DC
  Administered 2017-12-08 – 2017-12-10 (×3): 81 mg via ORAL
  Filled 2017-12-07 (×3): qty 1

## 2017-12-07 MED ORDER — ACETAMINOPHEN 500 MG PO TABS
1000.0000 mg | ORAL_TABLET | Freq: Once | ORAL | Status: AC
  Administered 2017-12-07: 1000 mg via ORAL
  Filled 2017-12-07: qty 2

## 2017-12-07 MED ORDER — CETAPHIL MOISTURIZING EX LOTN
1.0000 "application " | TOPICAL_LOTION | Freq: Every day | CUTANEOUS | Status: DC
  Administered 2017-12-08 – 2017-12-10 (×2): 1 via TOPICAL
  Filled 2017-12-07: qty 473

## 2017-12-07 MED ORDER — SERTRALINE HCL 50 MG PO TABS
50.0000 mg | ORAL_TABLET | Freq: Every day | ORAL | Status: DC
  Administered 2017-12-08 – 2017-12-10 (×3): 50 mg via ORAL
  Filled 2017-12-07 (×3): qty 1

## 2017-12-07 NOTE — Clinical Social Work Note (Signed)
Clinical Social Work Assessment  Patient Details  Name: [name] MRN: [id number] Date of Birth: 1934-06-02  Date of referral:  12/07/17               Reason for consult:  Facility Placement, Discharge Planning                Permission sought to share information with:    Permission granted to share information::  Yes, Release of Information Signed  Name::     Serita Grit   Agency::  family  Relationship::  daughter  Contact Information:  Serita Grit [phone]  Housing/Transportation Living arrangements for the past 2 months:  [place] of Information:  Adult Children Patient Interpreter Needed:  None Criminal Activity/Legal Involvement Pertinent to Current Situation/Hospitalization:  No - Comment as needed Significant Relationships:  Adult Children, Spouse Lives with:  Facility Resident Do you feel safe going back to the place where you live?  No Need for family participation in patient care:  Yes (Comment)  Care giving concerns:  CSW spoke with family at bedside as concerns are geared around pt falling a lot at current facility. Family has expressed interest in having pt placed into a SNF as pt is unable to care as much for self at this time.    Social Worker assessment / plan:  CSW spoke with pt's daughter at bedside. CSW was informed that pt is from a ALF/Memory Care facility however family is not wanting to return to this facility at the time of discharge. CSW has been working with pt and family on possible placement options at this time.   Employment status:  Retired Forensic scientist:  Medicare PT Recommendations:  Not assessed at this time Information / Referral to community resources:  Skilled Nursing Facility(spoke with family about SNF placement options. )  Patient/Family's Response to care:  Pt's family appears to be understanding and agreeable to plan of care at this time.  Patient/Family's Understanding of and Emotional  Response to Diagnosis, Current Treatment, and Prognosis:  No further questions or concerns have been presnted to CSW at this time. Family very appreciative of help and support from CSW at this time.   Emotional Assessment Appearance:  Appears stated age Attitude/Demeanor/Rapport:    Affect (typically observed):    Orientation:  (pt has a history of dementia. ) Alcohol / Substance use:  Not Applicable Psych involvement (Current and /or in the community):  No (Comment)  Discharge Needs  Concerns to be addressed:  Basic Needs, Care Coordination Readmission within the last 30 days:  No Current discharge risk:  Dependent with Mobility, Other(fractures.) Barriers to Discharge:  Continued Medical Work up   [employer], [place] 12/07/2017, 2:50 PM

## 2017-12-07 NOTE — ED Provider Notes (Signed)
[facility] EMERGENCY DEPARTMENT Provider Note   CSN: [id number] Arrival date & time: 12/07/17  4034     History   Chief Complaint Chief Complaint  Patient presents with  . Fall    HPI SAMAD THON is a 83 y.o. male.  HPI   83 year old male with extensive past medical history as below here with fall.  The patient was just seen yesterday for similar symptoms.  The patient has reportedly had 4 falls in the last several days.  He is at his mental baseline per the facility.  He has a history of dementia and frequent falls due to trying to walk without his walker.  Per report, he had an unwitnessed fall and was found down on the ground today.  He has bruising on his face but facility states that his chronic since her recent fall.  He also was diagnosed with an elbow injury yesterday and took his splint off at the home.  They deny any recent medication changes.  On my assessment, history limited due to history of dementia.  Patient is without specific complaints.  Vital signs been stable in route per EMS.  Level 5 caveat invoked as remainder of history, ROS, and physical exam limited due to patient's dementia.   Past Medical History:  Diagnosis Date  . Anxiety   . Arthritis   . CAD (coronary artery disease)    a.  cath 5/12: mLAD 50% then tandem 50%, dLAD 30%; D1 30%; OM1 occluded with collateral flow; pRCA 25%; EF 55% with inf HK;  occluded OM likely cause of perfusion defect on Myoview  . Colitis   . COPD (chronic obstructive pulmonary disease) ([place])   . Dementia    short term  . Depression   . Dizziness   . Dyslipidemia   . GERD (gastroesophageal reflux disease)   . Gout   . Hearing disorder   . Hyperlipidemia   . Hypertension   . Hypothyroidism   . Nonspecific abnormal unspecified cardiovascular function study   . Pacemaker   . Pelvic fracture ([place])   . Persistent atrial fibrillation ([place])   . Skin disorder    blister formation  . Tachy-brady  syndrome ([place])    s/p pacer  . Ulcerative colitis     Patient Active Problem List   Diagnosis Date Noted  . Fall 12/07/2017  . Adult failure to thrive 11/19/2016  . Hoarseness 02/06/2016  . COPD mixed type ([place]) 08/07/2014  . H/O asbestos exposure 08/07/2014  . Chest pain 07/06/2014  . Coronary artery disease involving native coronary artery of native heart with other form of angina pectoris ([place])   . Anxiety 01/19/2012  . Skin rash 06/08/2011  . Fatigue 12/02/2010  . Coronary atherosclerosis of native coronary artery 10/01/2010  . ARTHRITIS, RIGHT KNEE 09/30/2010  . Radicular leg pain 09/30/2010  . Abnormal cardiovascular function study 09/09/2010  . OTHER MONONEURITIS OF LOWER LIMB 07/16/2010  . UNSPECIFIED MONONEURITIS OF LOWER LIMB 06/05/2010  . ARTHRITIS, RIGHT KNEE 06/05/2010  . LEG PAIN 06/05/2010  . Hyperlipidemia 11/19/2008  . HTN (hypertension) 11/19/2008  . Atrial fibrillation ([place]) 11/19/2008  . BRADYCARDIA-TACHYCARDIA SYNDROME 11/19/2008  . ULCERATIVE COLITIS 11/19/2008  . DIZZINESS 11/19/2008  . PPM-Medtronic 11/19/2008    Past Surgical History:  Procedure Laterality Date  . BACK SURGERY  1975  . CARDIAC CATHETERIZATION  09/20/10   EF 55%.....Dr. Percival Spanish  . CATARACT EXTRACTION Right   . CATARACT EXTRACTION W/PHACO Left 12/17/2014   Procedure: CATARACT  EXTRACTION PHACO AND INTRAOCULAR LENS PLACEMENT (IOC);  Surgeon: Tonny Branch, MD;  Location: [facility];  Service: Ophthalmology;  Laterality: Left;  CDE 7.44  . EP IMPLANTABLE DEVICE N/A 04/02/2015   Procedure:  PPM Generator Changeout;  Surgeon: Thompson Grayer, MD;  Location: [facility];  Service: Cardiovascular;  Laterality: N/A;  . Implantation of a Medtronic dual chamber pacemaker  02/01/06   [name] [name] MD  . Randolm Idol / REPLACE / REMOVE PACEMAKER     MEDTRONIC....PPM Serial Number:  [id number].....Marland KitchenPPM Model Number:  [id number]....Marland KitchenPPM DOI:  02/01/2006    . KNEE SURGERY  2005   replacement  . LEFT HEART  CATHETERIZATION WITH CORONARY ANGIOGRAM N/A 07/06/2014   Procedure: LEFT HEART CATHETERIZATION WITH CORONARY ANGIOGRAM;  Surgeon: Sinclair Grooms, MD;  Location: [facility] CATH LAB;  Service: Cardiovascular;  Laterality: N/A;  . Left knee arthroscopy with debridement  04/23/2006   [name] M.D  . Left knee total knee  replacement  05/13/07   [name] M.D.  . LUMBAR SPINE SURGERY          Home Medications    Prior to Admission medications   Medication Sig Start Date End Date Taking? Authorizing Provider  ALPRAZolam (XANAX) 0.25 MG tablet Take 0.25 mg by mouth 3 (three) times daily as needed for anxiety (agitation (control)).    Yes [provider]  aspirin EC 81 MG tablet Take 81 mg by mouth daily.   Yes [provider]  cetaphil (CETAPHIL) cream Apply 1 application topically daily. Apply all over skin   Yes [provider]  cholestyramine (QUESTRAN) 4 g packet Take 2 g by mouth. M-W-F at bedtime   Yes [provider]  levothyroxine (SYNTHROID, LEVOTHROID) 50 MCG tablet Take 50 mcg by mouth daily before breakfast.  09/11/13  Yes [provider]  loperamide (IMODIUM A-D) 2 MG tablet Take 2 mg by mouth 4 (four) times daily as needed for diarrhea or loose stools.   Yes [provider]  Melatonin 3 MG TABS Take 3 mg by mouth at bedtime.   Yes [provider]  metoprolol tartrate (LOPRESSOR) 50 MG tablet TAKE ONE TABLET BY MOUTH TWICE A DAY Patient taking differently: TAKE 50MG BY MOUTH TWICE A DAY 07/05/17  Yes Herminio Commons, MD  sertraline (ZOLOFT) 50 MG tablet Take 50 mg by mouth daily.   Yes [provider]  bacitracin ointment Apply 1 application topically 2 (two) times daily. 12/06/17   Drenda Freeze, MD    Family History Family History  Problem Relation Age of Onset  . Lung disease Father        Emphysema  . Hypertension Father   . Peptic Ulcer Disease Father   . COPD Father   .  Hypertension Mother   . Liver cancer Mother   . Pernicious anemia Mother   . Depression Mother   . Kidney disease Mother   . Colitis Mother   . Asthma Sister   . Healthy Daughter   . Healthy Daughter   . Healthy Son   . COPD Sister   . Glaucoma Sister   . Hypertension Sister   . Cancer Sister     Social History Social History   Tobacco Use  . Smoking status: Former Smoker    Packs/day: 1.00    Years: 15.00    Pack years: 15.00    Types: Cigarettes    Start date: 05/13/1955    Last attempt to  quit: 05/12/1983    Years since quitting: 34.6  . Smokeless tobacco: Never Used  Substance Use Topics  . Alcohol use: No    Alcohol/week: 0.0 oz  . Drug use: No     Allergies   Nsaids   Review of Systems Review of Systems  Unable to perform ROS: Dementia  All other systems reviewed and are negative.    Physical Exam Updated Vital Signs BP 135/84 (BP Location: Right Arm)   Pulse 88   Temp 98.4 F (36.9 C) (Oral)   Resp 20   SpO2 97%   Physical Exam  Constitutional: He is oriented to person, place, and time. He appears well-developed and well-nourished. No distress.  HENT:  Head: Normocephalic and atraumatic.  Ecchymoses in diffuse stages of healing across face. No apparent new bruising or lacerations.  Eyes: Pupils are equal, round, and reactive to light. Conjunctivae are normal.  Neck: Neck supple.  Cardiovascular: Normal rate, regular rhythm and normal heart sounds. Exam reveals no friction rub.  No murmur heard. Pulmonary/Chest: Effort normal and breath sounds normal. No respiratory distress. He has no wheezes. He has no rales.  Bruising right chest, no deformity, chronic pes excavatum  Abdominal: Soft. Bowel sounds are normal. He exhibits no distension.  Musculoskeletal: He exhibits no edema.  Moderate TTP left elbow  Neurological: He is alert and oriented to person, place, and time. He exhibits normal muscle tone.  Skin: Skin is warm. Capillary refill takes  less than 2 seconds.  Multiple superficial skin tears L arm, no deep lacerations  Psychiatric: He has a normal mood and affect.  Nursing note and vitals reviewed.    ED Treatments / Results  Labs (all labs ordered are listed, but only abnormal results are displayed) Labs Reviewed  CBC WITH DIFFERENTIAL/PLATELET - Abnormal; Notable for the following components:      Result Value   WBC 10.8 (*)    Hemoglobin 11.6 (*)    HCT 37.1 (*)    Neutro Abs 8.5 (*)    All other components within normal limits  COMPREHENSIVE METABOLIC PANEL - Abnormal; Notable for the following components:   Glucose, Bld 106 (*)    Calcium 8.8 (*)    Albumin 3.2 (*)    All other components within normal limits  CBC - Abnormal; Notable for the following components:   WBC 10.9 (*)    Hemoglobin 12.0 (*)    HCT 37.0 (*)    All other components within normal limits  BASIC METABOLIC PANEL - Abnormal; Notable for the following components:   Glucose, Bld 100 (*)    All other components within normal limits  C DIFFICILE QUICK SCREEN W PCR REFLEX  GASTROINTESTINAL PANEL BY PCR, STOOL (REPLACES STOOL CULTURE)  URINALYSIS, ROUTINE W REFLEX MICROSCOPIC  PROTIME-INR  TSH    EKG EKG Interpretation  Date/Time:  Tuesday December 07 2017 09:48:37 EDT Ventricular Rate:  70 PR Interval:    QRS Duration: 164 QT Interval:  473 QTC Calculation: 511 R Axis:   118 Text Interpretation:  Atrial fibrillation Right bundle branch block ST depr, consider ischemia, inferior leads No significant change since last tracing Confirmed by Duffy Bruce [id number]) on 12/07/2017 10:07:07 AM   Radiology Dg Chest 1 View  Result Date: 12/06/2017 CLINICAL DATA:  Fall EXAM: CHEST  1 VIEW COMPARISON:  05/24/2016 FINDINGS: Left-sided pacing device as before. Mild cardiomegaly with aortic atherosclerosis. No pneumothorax. IMPRESSION: No active disease.  Mild cardiomegaly Electronically Signed   By: Maudie Mercury  Francoise Ceo M.D.   On: 12/06/2017 17:15    Dg Chest 2 View  Result Date: 12/07/2017 CLINICAL DATA:  Unwitnessed fall at a memory care facility. Patient complains of right upper quadrant abdominal pain since the fall. Three falls over the past 10 days. EXAM: CHEST - 2 VIEW COMPARISON:  Chest x-ray of December 06, 2017. FINDINGS: The lungs are adequately inflated. The interstitial markings are slightly more prominent today. The cardiac silhouette remains enlarged. The pulmonary vascularity is not clearly engorged. The ICD is in stable position. There is calcification in the wall of the aortic arch. The observed bony thorax is unremarkable. No abnormal subdiaphragmatic gas collections are observed. IMPRESSION: Chronic bronchitic changes. Slight increased prominence of the pulmonary interstitium may reflect exacerbation of COPD or low-grade interstitial edema. Stable mild enlargement of the cardiac silhouette. No alveolar pneumonia. No evidence of acute thoracic trauma. Thoracic aortic atherosclerosis. Electronically Signed   By: David  Martinique M.D.   On: 12/07/2017 10:12   Dg Pelvis 1-2 Views  Result Date: 12/06/2017 CLINICAL DATA:  Fall with pain EXAM: PELVIS - 1-2 VIEW COMPARISON:  None. FINDINGS: Scoliosis and degenerative changes of the lumbar spine. Mild SI joint degenerative change. Pubic symphysis and rami are intact. No fracture or dislocation. Mild degenerative changes of both hips. IMPRESSION: No definite acute osseous abnormality. Electronically Signed   By: Donavan Foil M.D.   On: 12/06/2017 17:31   Dg Elbow Complete Left  Result Date: 12/06/2017 CLINICAL DATA:  Recent fall EXAM: LEFT ELBOW - COMPLETE 3+ VIEW COMPARISON:  None. FINDINGS: Elbow effusion is present. Possible cortical step-off at the femoral head neck junction. No dislocation evident. IMPRESSION: Elbow effusion with possible subtle nondisplaced fracture at the junction of the radial head and neck. Electronically Signed   By: Donavan Foil M.D.   On: 12/06/2017 17:30   Ct  Head Wo Contrast  Result Date: 12/07/2017 CLINICAL DATA:  83 year old male with a history of falls EXAM: CT HEAD WITHOUT CONTRAST CT CERVICAL SPINE WITHOUT CONTRAST TECHNIQUE: Multidetector CT imaging of the head and cervical spine was performed following the standard protocol without intravenous contrast. Multiplanar CT image reconstructions of the cervical spine were also generated. COMPARISON:  12/06/2017, 11/30/2017 FINDINGS: CT HEAD FINDINGS Brain: No acute intracranial hemorrhage. No midline shift or mass effect. Gray-white differentiation is maintained. Bifrontal diameter is unchanged from the CT 05/24/2016. Similar appearance of confluent hypodensity in the periventricular white matter. Mild volume loss. Vascular: Calcifications of the intracranial vasculature. Skull: No acute displaced fracture.  No aggressive bony lesions. Sinuses/Orbits: Unremarkable appearance of the orbits. Paranasal sinuses are clear. No mastoid effusion. Other: Similar appearance of swelling/hematoma in the right supraorbital region. Surgical clips again noted within the right occipital soft tissues. CT CERVICAL SPINE FINDINGS Alignment: No subluxation of the cervical vertebral bodies. Facets maintain alignment. Skull base and vertebrae: Craniocervical junction aligned. No skull base fracture no fracture identified of the cervical vertebral bodies. Vertebral body heights maintained. Soft tissues and spinal canal: . unremarkable appearance of the soft tissues. Minimal carotid calcifications. Disc levels: C2-C3: No canal narrowing or significant foraminal narrowing C3-C4: Uncovertebral joint disease and facet disease contributes to advanced left and mild right neural foraminal narrowing. No significant canal narrowing. C4-C5: Uncovertebral joint disease and left-sided facet disease contributes to moderate left sided foraminal narrowing. Mild right-sided foraminal narrowing secondary to uncovertebral joint disease. C5-C6: Uncovertebral  joint disease and right-sided facet disease contributes to moderate to advanced foraminal narrowing on the right. Uncovertebral joint disease contributes to  mild left foraminal narrowing. C6-C7: Right greater than left uncovertebral joint disease contributes to right greater than left foraminal narrowing. No bony canal narrowing. Upper chest: Unremarkable Other: None IMPRESSION: Head CT: No acute intracranial abnormality. Redemonstration of soft tissue hematoma in the right supraorbital region. Redemonstration of surgical clips in the right occipital region. Evidence of chronic microvascular ischemic disease and associated intracranial atherosclerosis. Cervical CT: No acute fracture or malalignment of the cervical spine. Multilevel disc disease, as above. Electronically Signed   By: Corrie Mckusick D.O.   On: 12/07/2017 11:13   Ct Head Wo Contrast  Result Date: 12/06/2017 CLINICAL DATA:  Facial trauma with fracture suspected. Fall with head hematoma. Initial encounter. EXAM: CT HEAD WITHOUT CONTRAST CT MAXILLOFACIAL WITHOUT CONTRAST CT CERVICAL SPINE WITHOUT CONTRAST TECHNIQUE: Multidetector CT imaging of the head, cervical spine, and maxillofacial structures were performed using the standard protocol without intravenous contrast. Multiplanar CT image reconstructions of the cervical spine and maxillofacial structures were also generated. COMPARISON:  Head and cervical spine CT 6 days ago FINDINGS: CT HEAD FINDINGS Brain: Advanced atrophy, especially in the medial temporal lobes. Patient has history of dementia and this pattern favors Alzheimer's disease. There is also extensive chronic small vessel ischemia in the cerebral white matter with confluent low-density. No evidence of hemorrhage, contusion, or acute infarct. Vascular: Extensive atherosclerotic calcification Skull: Negative for fracture. New posterior scalp contusion with staples. Decreasing right forehead contusion. CT MAXILLOFACIAL FINDINGS Osseous:  Negative for acute fracture or mandibular dislocation. A lucency on sagittal reformats through the subcondylar right mandible is likely an old fracture. The appearance is stable from comparison cervical spine CT and is incomplete on coronal reformats. Orbits: No evidence of injury.  Bilateral cataract resection. Sinuses: No evidence of injury Soft tissues: Right forehead contusion that has diminished from recent scan. CT CERVICAL SPINE FINDINGS Alignment: No traumatic malalignment Skull base and vertebrae: Negative for fracture Soft tissues and spinal canal: Soft tissue stranding deep to the upper left sternocleidomastoid, new. Disc levels: Diffuse degenerative disc narrowing and endplate ridging. Facet arthropathy with ankylosis on the left at C2-3. Upper chest: Negative IMPRESSION: 1. No evidence of acute intracranial injury. 2. Posterior scalp contusion without calvarial fracture. 3. Left lateral neck soft tissue contusion without cervical spine fracture. 4. Negative for acute facial fracture. 5. Decreasing right forehead hematoma compared to CT 6 days ago. 6. Advanced atrophy and chronic small vessel ischemia. Electronically Signed   By: Monte Fantasia M.D.   On: 12/06/2017 18:32   Ct Cervical Spine Wo Contrast  Result Date: 12/07/2017 CLINICAL DATA:  83 year old male with a history of falls EXAM: CT HEAD WITHOUT CONTRAST CT CERVICAL SPINE WITHOUT CONTRAST TECHNIQUE: Multidetector CT imaging of the head and cervical spine was performed following the standard protocol without intravenous contrast. Multiplanar CT image reconstructions of the cervical spine were also generated. COMPARISON:  12/06/2017, 11/30/2017 FINDINGS: CT HEAD FINDINGS Brain: No acute intracranial hemorrhage. No midline shift or mass effect. Gray-white differentiation is maintained. Bifrontal diameter is unchanged from the CT 05/24/2016. Similar appearance of confluent hypodensity in the periventricular white matter. Mild volume loss.  Vascular: Calcifications of the intracranial vasculature. Skull: No acute displaced fracture.  No aggressive bony lesions. Sinuses/Orbits: Unremarkable appearance of the orbits. Paranasal sinuses are clear. No mastoid effusion. Other: Similar appearance of swelling/hematoma in the right supraorbital region. Surgical clips again noted within the right occipital soft tissues. CT CERVICAL SPINE FINDINGS Alignment: No subluxation of the cervical vertebral bodies. Facets maintain alignment. Skull base and  vertebrae: Craniocervical junction aligned. No skull base fracture no fracture identified of the cervical vertebral bodies. Vertebral body heights maintained. Soft tissues and spinal canal: . unremarkable appearance of the soft tissues. Minimal carotid calcifications. Disc levels: C2-C3: No canal narrowing or significant foraminal narrowing C3-C4: Uncovertebral joint disease and facet disease contributes to advanced left and mild right neural foraminal narrowing. No significant canal narrowing. C4-C5: Uncovertebral joint disease and left-sided facet disease contributes to moderate left sided foraminal narrowing. Mild right-sided foraminal narrowing secondary to uncovertebral joint disease. C5-C6: Uncovertebral joint disease and right-sided facet disease contributes to moderate to advanced foraminal narrowing on the right. Uncovertebral joint disease contributes to mild left foraminal narrowing. C6-C7: Right greater than left uncovertebral joint disease contributes to right greater than left foraminal narrowing. No bony canal narrowing. Upper chest: Unremarkable Other: None IMPRESSION: Head CT: No acute intracranial abnormality. Redemonstration of soft tissue hematoma in the right supraorbital region. Redemonstration of surgical clips in the right occipital region. Evidence of chronic microvascular ischemic disease and associated intracranial atherosclerosis. Cervical CT: No acute fracture or malalignment of the cervical  spine. Multilevel disc disease, as above. Electronically Signed   By: Corrie Mckusick D.O.   On: 12/07/2017 11:13   Ct Cervical Spine Wo Contrast  Result Date: 12/06/2017 CLINICAL DATA:  Facial trauma with fracture suspected. Fall with head hematoma. Initial encounter. EXAM: CT HEAD WITHOUT CONTRAST CT MAXILLOFACIAL WITHOUT CONTRAST CT CERVICAL SPINE WITHOUT CONTRAST TECHNIQUE: Multidetector CT imaging of the head, cervical spine, and maxillofacial structures were performed using the standard protocol without intravenous contrast. Multiplanar CT image reconstructions of the cervical spine and maxillofacial structures were also generated. COMPARISON:  Head and cervical spine CT 6 days ago FINDINGS: CT HEAD FINDINGS Brain: Advanced atrophy, especially in the medial temporal lobes. Patient has history of dementia and this pattern favors Alzheimer's disease. There is also extensive chronic small vessel ischemia in the cerebral white matter with confluent low-density. No evidence of hemorrhage, contusion, or acute infarct. Vascular: Extensive atherosclerotic calcification Skull: Negative for fracture. New posterior scalp contusion with staples. Decreasing right forehead contusion. CT MAXILLOFACIAL FINDINGS Osseous: Negative for acute fracture or mandibular dislocation. A lucency on sagittal reformats through the subcondylar right mandible is likely an old fracture. The appearance is stable from comparison cervical spine CT and is incomplete on coronal reformats. Orbits: No evidence of injury.  Bilateral cataract resection. Sinuses: No evidence of injury Soft tissues: Right forehead contusion that has diminished from recent scan. CT CERVICAL SPINE FINDINGS Alignment: No traumatic malalignment Skull base and vertebrae: Negative for fracture Soft tissues and spinal canal: Soft tissue stranding deep to the upper left sternocleidomastoid, new. Disc levels: Diffuse degenerative disc narrowing and endplate ridging. Facet  arthropathy with ankylosis on the left at C2-3. Upper chest: Negative IMPRESSION: 1. No evidence of acute intracranial injury. 2. Posterior scalp contusion without calvarial fracture. 3. Left lateral neck soft tissue contusion without cervical spine fracture. 4. Negative for acute facial fracture. 5. Decreasing right forehead hematoma compared to CT 6 days ago. 6. Advanced atrophy and chronic small vessel ischemia. Electronically Signed   By: Monte Fantasia M.D.   On: 12/06/2017 18:32   Ct Abdomen Pelvis W Contrast  Result Date: 12/07/2017 CLINICAL DATA:  Multiple recent falls. Dementia. Right upper quadrant abdominal pain. EXAM: CT ABDOMEN AND PELVIS WITH CONTRAST TECHNIQUE: Multidetector CT imaging of the abdomen and pelvis was performed using the standard protocol following bolus administration of intravenous contrast. CONTRAST:  166m OMNIPAQUE IOHEXOL 300 MG/ML  SOLN COMPARISON:  None. FINDINGS: Lower chest: Normal Hepatobiliary: Mild fatty change of the liver. No evidence of liver injury or significant focal finding. There are a few small gallstones dependent in the gallbladder without CT evidence of cholecystitis or biliary obstruction. Pancreas: Normal Spleen: Normal Adrenals/Urinary Tract: Adrenal glands are normal. Few small renal cysts. No evidence of renal injury. No stone or mass. No hydronephrosis. Stomach/Bowel: No bowel abnormality. Vascular/Lymphatic: Aortic atherosclerosis. No aneurysm. IVC is normal. No retroperitoneal adenopathy. Reproductive: Normal Other: No free fluid or air. Musculoskeletal: Fracture of a lower right lateral rib, possibly rib number 7. curvature in degenerative change of the spine. Superior endplate fractures at W54 and L1 with minor loss of height that could be acute or subacute. No retropulsed bone. IMPRESSION: No traumatic intra abdominal or pelvic finding. Right lateral rib fracture, possibly rib 7. No pneumothorax or hemothorax or evidence of liver injury. Superior  endplate fractures at O27 and L1 with minor loss of height that could be acute or subacute. Mild fatty change of the liver.  Small gallstones. Aortic atherosclerosis. Lumbar curvature and degenerative changes. Electronically Signed   By: Nelson Chimes M.D.   On: 12/07/2017 13:07   Ct Maxillofacial Wo Contrast  Result Date: 12/06/2017 CLINICAL DATA:  Facial trauma with fracture suspected. Fall with head hematoma. Initial encounter. EXAM: CT HEAD WITHOUT CONTRAST CT MAXILLOFACIAL WITHOUT CONTRAST CT CERVICAL SPINE WITHOUT CONTRAST TECHNIQUE: Multidetector CT imaging of the head, cervical spine, and maxillofacial structures were performed using the standard protocol without intravenous contrast. Multiplanar CT image reconstructions of the cervical spine and maxillofacial structures were also generated. COMPARISON:  Head and cervical spine CT 6 days ago FINDINGS: CT HEAD FINDINGS Brain: Advanced atrophy, especially in the medial temporal lobes. Patient has history of dementia and this pattern favors Alzheimer's disease. There is also extensive chronic small vessel ischemia in the cerebral white matter with confluent low-density. No evidence of hemorrhage, contusion, or acute infarct. Vascular: Extensive atherosclerotic calcification Skull: Negative for fracture. New posterior scalp contusion with staples. Decreasing right forehead contusion. CT MAXILLOFACIAL FINDINGS Osseous: Negative for acute fracture or mandibular dislocation. A lucency on sagittal reformats through the subcondylar right mandible is likely an old fracture. The appearance is stable from comparison cervical spine CT and is incomplete on coronal reformats. Orbits: No evidence of injury.  Bilateral cataract resection. Sinuses: No evidence of injury Soft tissues: Right forehead contusion that has diminished from recent scan. CT CERVICAL SPINE FINDINGS Alignment: No traumatic malalignment Skull base and vertebrae: Negative for fracture Soft tissues and  spinal canal: Soft tissue stranding deep to the upper left sternocleidomastoid, new. Disc levels: Diffuse degenerative disc narrowing and endplate ridging. Facet arthropathy with ankylosis on the left at C2-3. Upper chest: Negative IMPRESSION: 1. No evidence of acute intracranial injury. 2. Posterior scalp contusion without calvarial fracture. 3. Left lateral neck soft tissue contusion without cervical spine fracture. 4. Negative for acute facial fracture. 5. Decreasing right forehead hematoma compared to CT 6 days ago. 6. Advanced atrophy and chronic small vessel ischemia. Electronically Signed   By: Monte Fantasia M.D.   On: 12/06/2017 18:32    Procedures Procedures (including critical care time)  Medications Ordered in ED Medications  ALPRAZolam Duanne Moron) tablet 0.25 mg (0.25 mg Oral Given 12/07/17 1830)  aspirin EC tablet 81 mg (81 mg Oral Given 12/08/17 0853)  cetaphil lotion 1 application (1 application Topical Given 12/08/17 0854)  levothyroxine (SYNTHROID, LEVOTHROID) tablet 50 mcg (50 mcg Oral Given 12/08/17 0853)  loperamide (  IMODIUM) capsule 2 mg (has no administration in time range)  Melatonin TABS 3 mg (3 mg Oral Given 12/07/17 2208)  metoprolol tartrate (LOPRESSOR) tablet 50 mg (50 mg Oral Given 12/08/17 0853)  sertraline (ZOLOFT) tablet 50 mg (50 mg Oral Given 12/08/17 0853)  acetaminophen (TYLENOL) tablet 650 mg (has no administration in time range)    Or  acetaminophen (TYLENOL) suppository 650 mg (has no administration in time range)  oxyCODONE (Oxy IR/ROXICODONE) immediate release tablet 5 mg (5 mg Oral Given 12/08/17 0854)  iohexol (OMNIPAQUE) 300 MG/ML solution 100 mL (100 mLs Intravenous Contrast Given 12/07/17 1238)  acetaminophen (TYLENOL) tablet 1,000 mg (1,000 mg Oral Given 12/07/17 1345)  fentaNYL (SUBLIMAZE) injection 25 mcg (25 mcg Intravenous Given 12/07/17 1345)     Initial Impression / Assessment and Plan / ED Course  I have reviewed the triage vital signs and the  nursing notes.  Pertinent labs & imaging results that were available during my care of the patient were reviewed by me and considered in my medical decision making (see chart for details).  Clinical Course as of Dec 08 1001  Tue Dec 07, 2017  1054 83 yo M here with recurrent falls. From SNF. Just seen yesterday for fall, had neg labs. Will re-check given persistent increased in fall frequency, repeat imaging. Will d/w Family once here.   [CI]  [id number] Patient with increasingly severe right chest wall pain.  He is intermittently tachypneic.  Imaging does show a right seventh rib fracture as well as subtle superior endplate fractures.  Patient essentially unable to sit up without extreme pain at this time.  Unfortunately, pain control will be an issue given his age and dementia.  Will give Tylenol, a low dose of fentanyl, and given that he is unable to sit up or move, whereas normally he can move with a walker, feels best to admit him for PT and likely SNF placement.   [CI]    Clinical Course User Index [CI] Duffy Bruce, MD   83 year old male here with recurrent fall.  Patient was just seen yesterday and diagnosed with elbow fracture and splint has been removed by patient.  Will replace the splint, send screening labs, and repeat imaging given new fall.  Imaging is c/f new rib fx, minimally impacted SEP fx. No apparent emergent indication for surgical intervention. Given new rib fx, increasing falls, need for pain control, will admit.  Final Clinical Impressions(s) / ED Diagnoses   Final diagnoses:  Fall, initial encounter  Closed fracture of one rib of right side, initial encounter    ED Discharge Orders    None       Duffy Bruce, MD 12/08/17 1003

## 2017-12-07 NOTE — NC FL2 (Signed)
[place] LEVEL OF CARE SCREENING TOOL     IDENTIFICATION  Patient Name: [name] Birthdate: Jul 31, 1934 Sex: male Admission Date (Current Location): 12/07/2017  [facility] and [place] Number:  Herbalist and Address:  [facility], [place] [address], [place], [place] [address]      Provider Number: [id number]  Attending Physician Name and Address:  Duffy Bruce, MD  Relative Name and Phone Number:       Current Level of Care: Hospital Recommended Level of Care: [place] Prior Approval Number:    Date Approved/Denied:   PASRR Number:    Discharge Plan: SNF    Current Diagnoses: Patient Active Problem List   Diagnosis Date Noted  . Adult failure to thrive 11/19/2016  . Hoarseness 02/06/2016  . COPD mixed type ([place]) 08/07/2014  . H/O asbestos exposure 08/07/2014  . Chest pain 07/06/2014  . Coronary artery disease involving native coronary artery of native heart with other form of angina pectoris ([place])   . Anxiety 01/19/2012  . Skin rash 06/08/2011  . Fatigue 12/02/2010  . Coronary atherosclerosis of native coronary artery 10/01/2010  . ARTHRITIS, RIGHT KNEE 09/30/2010  . Radicular leg pain 09/30/2010  . Abnormal cardiovascular function study 09/09/2010  . OTHER MONONEURITIS OF LOWER LIMB 07/16/2010  . UNSPECIFIED MONONEURITIS OF LOWER LIMB 06/05/2010  . ARTHRITIS, RIGHT KNEE 06/05/2010  . LEG PAIN 06/05/2010  . Hyperlipidemia 11/19/2008  . HTN (hypertension) 11/19/2008  . Atrial fibrillation ([place]) 11/19/2008  . BRADYCARDIA-TACHYCARDIA SYNDROME 11/19/2008  . ULCERATIVE COLITIS 11/19/2008  . DIZZINESS 11/19/2008  . PPM-Medtronic 11/19/2008    Orientation RESPIRATION BLADDER Height & Weight     (possible dementia getting worse. )  Normal Incontinent Weight:   Height:     BEHAVIORAL SYMPTOMS/MOOD NEUROLOGICAL BOWEL NUTRITION STATUS      Incontinent Diet(please see discharge summary. )   AMBULATORY STATUS COMMUNICATION OF NEEDS Skin   Extensive Assist Verbally Bruising(from fall )                       Personal Care Assistance Level of Assistance  Bathing, Feeding, Dressing Bathing Assistance: Maximum assistance Feeding assistance: Limited assistance Dressing Assistance: Maximum assistance     Functional Limitations Info  Sight, Hearing, Speech Sight Info: Adequate Hearing Info: Adequate Speech Info: Adequate    SPECIAL CARE FACTORS FREQUENCY  PT (By licensed PT), OT (By licensed OT)     PT Frequency: 5 times a week  OT Frequency: 5 times a week             Contractures Contractures Info: Not present    Additional Factors Info  Code Status, Allergies Code Status Info: prior Allergies Info: Nsaids           Current Medications (12/07/2017):  This is the current hospital active medication list No current facility-administered medications for this encounter.    Current Outpatient Medications  Medication Sig Dispense Refill  . ALPRAZolam (XANAX) 0.25 MG tablet Take 0.25 mg by mouth 3 (three) times daily as needed for anxiety (agitation (control)).     Marland Kitchen aspirin EC 81 MG tablet Take 81 mg by mouth daily.    . bacitracin ointment Apply 1 application topically 2 (two) times daily. 120 g 0  . cetaphil (CETAPHIL) cream Apply topically daily.    . cholecalciferol (VITAMIN D) 1000 units tablet Take 1 capsule by mouth daily with supper.     . cholestyramine Lucrezia Starch)  4 g packet Take 2 g by mouth. M-W-F at bedtime    . levothyroxine (SYNTHROID, LEVOTHROID) 50 MCG tablet Take 50 mcg by mouth daily before breakfast.     . loperamide (IMODIUM A-D) 2 MG tablet Take 2 mg by mouth 4 (four) times daily as needed for diarrhea or loose stools.    . Melatonin 1 MG CAPS Take 3 mg by mouth.    . metoprolol tartrate (LOPRESSOR) 50 MG tablet TAKE ONE TABLET BY MOUTH TWICE A DAY 180 tablet 0  . sertraline (ZOLOFT) 50 MG tablet Take 50 mg by mouth daily.    . Vitamin  D, Ergocalciferol, (DRISDOL) 50000 units CAPS capsule Take 50,000 Units by mouth every 7 (seven) days.       Discharge Medications: Please see discharge summary for a list of discharge medications.  Relevant Imaging Results:  Relevant Lab Results:   Additional Information [phone]. Pt's family has agreed to pay out of pocket for SNF stay until [facility] application can be completed and filed.   Wetzel Bjornstad, LCSWA

## 2017-12-07 NOTE — H&P (Signed)
Date: 12/07/2017               Patient Name:  [name] MRN: [id number]  DOB: 06-12-1934 Age / Sex: 83 y.o., male   PCP: System, Provider Not In         Medical Service: Internal Medicine Teaching Service         Attending Physician: Dr. Lynnae January, Real Cons, MD    First Contact: Dr. Eileen Stanford Pager: [phone]  Second Contact: Dr. Jari Favre Pager: [phone]       After Hours (After 5p/  First Contact Pager: [phone]  weekends / holidays): Second Contact Pager: [phone]   Chief Complaint: Frequent falls   History of Present Illness:  [name] is an 83 year old male with past medical history significant for advanced dementia, tachybradycardia syndrome, atrial fibrillation with pacemaker, hypertension, CAD, hyperlipidemia, ulcerative colitis, hypothyroidism and COPD who presented with frequent falls and loose stools.  Patient was examined at bedside with his daughter and brother-in-law who provided most of the history.  He has been a resident at [facility] in his March of this year and in the last week has been found to have frequent falls with multiple ED visits.  The daughter reports that in the last week he is fell 3 times and has been seen at both [facility] emergency department.  In the ED yesterday, he was found to have an elbow injury and a laceration at the back of his head which was closed with staples.  Today, he was brought into the ED after an unwitnessed fall.  Apparently he was found on the ground by one of the staff at his memory care.  There was no reports of syncope.  Per the daughter, he will usually get up, take a couple steps and subsequently fall.  There has been no reports of chest pain, dysrhythmia, lightheadedness or headaches.  She also reports that over the past week he has been more confused.  He also has a history a 3-4 month of loose stools.  The daughter reports that he would have 1 large bowel movement in the morning and subsequently another in  the afternoon and evening but smaller in quantity.  She denies hematochezia or melena.  He was seen by his gastroenterologist in [place] and had work-up for C. difficile which was negative. There is no reports of abdominal pain or cramps.  There is no new medication changes but he started taking sertraline in March.  Daughter also reports urinary incontinence.  ED course: Vitals: BP 162/87, CT abdomen and pelvis showed right lateral rib fracture, superior endplate fractures at R91 and L1, CT head and cervical spine which showed soft tissue hematoma in the right supraorbital region, chronic microvascular ischemic disease and intracranial arthrosclerosis.  He received Tylenol 1000 mg, fentanyl 25 mcg.  Meds:  Current Meds  Medication Sig  . ALPRAZolam (XANAX) 0.25 MG tablet Take 0.25 mg by mouth 3 (three) times daily as needed for anxiety (agitation (control)).   Marland Kitchen aspirin EC 81 MG tablet Take 81 mg by mouth daily.  . cetaphil (CETAPHIL) cream Apply 1 application topically daily. Apply all over skin  . cholestyramine (QUESTRAN) 4 g packet Take 2 g by mouth. M-W-F at bedtime  . levothyroxine (SYNTHROID, LEVOTHROID) 50 MCG tablet Take 50 mcg by mouth daily before breakfast.   . loperamide (IMODIUM A-D) 2 MG tablet Take 2 mg by mouth 4 (four) times daily as needed for diarrhea or loose  stools.  . Melatonin 3 MG TABS Take 3 mg by mouth at bedtime.  . metoprolol tartrate (LOPRESSOR) 50 MG tablet TAKE ONE TABLET BY MOUTH TWICE A DAY (Patient taking differently: TAKE 50MG BY MOUTH TWICE A DAY)  . sertraline (ZOLOFT) 50 MG tablet Take 50 mg by mouth daily.     Allergies: Allergies as of 12/07/2017 - Review Complete 12/07/2017  Allergen Reaction Noted  . Nsaids  08/19/2010   Past Medical History:  Diagnosis Date  . Anxiety   . Arthritis   . CAD (coronary artery disease)    a.  cath 5/12: mLAD 50% then tandem 50%, dLAD 30%; D1 30%; OM1 occluded with collateral flow; pRCA 25%; EF 55% with inf HK;   occluded OM likely cause of perfusion defect on Myoview  . Colitis   . COPD (chronic obstructive pulmonary disease) ([place])   . Dementia    short term  . Depression   . Dizziness   . Dyslipidemia   . GERD (gastroesophageal reflux disease)   . Gout   . Hearing disorder   . Hyperlipidemia   . Hypertension   . Hypothyroidism   . Nonspecific abnormal unspecified cardiovascular function study   . Pacemaker   . Pelvic fracture ([place])   . Persistent atrial fibrillation ([place])   . Skin disorder    blister formation  . Tachy-brady syndrome ([place])    s/p pacer  . Ulcerative colitis     Social History:  He lives at a memory care [employer])  Review of Systems: A complete ROS was negative except as per HPI.   Physical Exam: Blood pressure 140/83, pulse 80, temperature 98.2 F (36.8 C), temperature source Oral, resp. rate 20, SpO2 97 %.  Constitutional: Only in distress when moved HEENT: Contusion on the right side of the face, laceration to the back of the head with staples Eyes: Nonicteric Cardiovascular: In sinus rhythm, pacemaker at the left upper chest, normal S1, S2.  No murmurs, gallops, rubs Respiratory: CTA BL, no crackles, wheezes, rhonchi Abdomen: Soft, nontender to palpation, nondistended Extremity: Left arm wrapped in a splint Back: Spinous process nontender to palpation Neuro: Not alert or oriented to name, place or person Psych: Mood and affect normal   EKG: Initial EKG showed sinus rhythm with a long PR interval.  Repeat EKG showed A. fib   Assessment & Plan by Problem: Active Problems:   Fall  [name] is an 83 year old male with past medical history significant for advanced dementia, tachybradycardia syndrome, atrial fibrillation with pacemaker, hypertension, CAD, hyperlipidemia, ulcerative colitis, hypothyroidism and COPD here for frequent falls and loose stools.   Fall Rib fracture Multiple falls in the past 2 weeks with several ED visits resulting  with right face contusion, elbow injury, rib fracture and occipital contusion.  Etiology of fall still remains unclear however per description of weakness after standing differential includes orthostatic hypotension vs generalized weakness secondary to advanced age vs advanced dementia. History of A. fib with pacemaker with no reports of dysrhythmia or palpitation. -Pain regimen: Tylenol 650 mg q6 prn, OxyIR 5 mg q4prn -Orthostatic vitals -PT/OT evaluation  Advanced dementia Bowel and urinary incontinence Reports of worsening memory and confusion this past week. 3 to 21-monthhistory of loose stool and urinary incontinence. Daughter reports [place].  History of ulcerative colitis however less inclined this is a UC flare given the chronicity of loose stools.  Possible medication induced as  he started taking Zoloft in March however less likely likely given that he has passed the acute timeline for serotonin induced diarrhea -Follow-up GI panel PCR -Continue Xanax 0.25 mg TID prn -Continue melatonin 3 mg p.o. Daily -Continue Zoloft 50 mg daily -Continue Imodium 2 mg p.o. prn -Delirium precaution  Atrial fibrillation Repeat EKG in ED showed with atrial fibrillation. -On metoprolol 50 mg BID  Coronary artery disease -Continue aspirin 81 mg  Hypothyroidism -Continue Synthroid 50 mcg p.o. daily  Hypertension Hypertensive in the ED -Continue metoprolol 50 mg p.o. BID   FEN: No fluids, replace electrolytes as needed heart healthy VTE prophylaxis: SCD CODE STATUS: DNR  Dispo: Admit patient to inpatient with expected length of stay less than 2 midnights   Signed: Jean Rosenthal, MD 12/07/2017, 5:52 PM  Pager: [phone]

## 2017-12-07 NOTE — Progress Notes (Signed)
CSW has been working with pt and family on placement. CSW now aware that pt is being admitted. CSW to handoff to unit CSW as needed.    [name], MSW, [place] [facility] Clinical Social Worker [phone]

## 2017-12-07 NOTE — ED Notes (Signed)
C-collar removed and patient given ice chips.

## 2017-12-07 NOTE — ED Triage Notes (Signed)
Pt here from [facility] via [facility] after falling today. Pt has had 3 falls in the last 10 days per family. This morning, pt had unwitnessed fall at memory care facility. Two new skin tears noted to patient's left arm. Per staff at facility, pt c/o RUQ abdominal pain post fall. Family at bedside. Call light within reach. Vital signs stable at this time.

## 2017-12-07 NOTE — Progress Notes (Signed)
Orthopedic Tech Progress Note Patient Details:  [name] 07-03-1934 [id number]  Ortho Devices Type of Ortho Device: Ace wrap, Long arm splint Splint Material: Fiberglass Ortho Device/Splint Location: lue Ortho Device/Splint Interventions: Application   Post Interventions Patient Tolerated: Well Instructions Provided: Care of device Sling not applied at this time because pt may be admitted to hospital; RN notified  Hildred Priest 12/07/2017, 1:58 PM

## 2017-12-08 DIAGNOSIS — W19XXXA Unspecified fall, initial encounter: Secondary | ICD-10-CM

## 2017-12-08 DIAGNOSIS — F0390 Unspecified dementia without behavioral disturbance: Secondary | ICD-10-CM

## 2017-12-08 DIAGNOSIS — R011 Cardiac murmur, unspecified: Secondary | ICD-10-CM

## 2017-12-08 DIAGNOSIS — S52125A Nondisplaced fracture of head of left radius, initial encounter for closed fracture: Secondary | ICD-10-CM

## 2017-12-08 DIAGNOSIS — S0083XA Contusion of other part of head, initial encounter: Secondary | ICD-10-CM

## 2017-12-08 DIAGNOSIS — I4891 Unspecified atrial fibrillation: Secondary | ICD-10-CM

## 2017-12-08 DIAGNOSIS — Z7982 Long term (current) use of aspirin: Secondary | ICD-10-CM

## 2017-12-08 DIAGNOSIS — I10 Essential (primary) hypertension: Secondary | ICD-10-CM

## 2017-12-08 DIAGNOSIS — M8088XA Other osteoporosis with current pathological fracture, vertebra(e), initial encounter for fracture: Secondary | ICD-10-CM

## 2017-12-08 DIAGNOSIS — I2510 Atherosclerotic heart disease of native coronary artery without angina pectoris: Secondary | ICD-10-CM

## 2017-12-08 DIAGNOSIS — Z79899 Other long term (current) drug therapy: Secondary | ICD-10-CM

## 2017-12-08 DIAGNOSIS — S2241XA Multiple fractures of ribs, right side, initial encounter for closed fracture: Secondary | ICD-10-CM

## 2017-12-08 DIAGNOSIS — E039 Hypothyroidism, unspecified: Secondary | ICD-10-CM

## 2017-12-08 DIAGNOSIS — Z950 Presence of cardiac pacemaker: Secondary | ICD-10-CM

## 2017-12-08 DIAGNOSIS — J449 Chronic obstructive pulmonary disease, unspecified: Secondary | ICD-10-CM

## 2017-12-08 DIAGNOSIS — K5190 Ulcerative colitis, unspecified, without complications: Principal | ICD-10-CM

## 2017-12-08 DIAGNOSIS — I495 Sick sinus syndrome: Secondary | ICD-10-CM

## 2017-12-08 DIAGNOSIS — Z66 Do not resuscitate: Secondary | ICD-10-CM

## 2017-12-08 DIAGNOSIS — E785 Hyperlipidemia, unspecified: Secondary | ICD-10-CM

## 2017-12-08 DIAGNOSIS — Z9181 History of falling: Secondary | ICD-10-CM

## 2017-12-08 DIAGNOSIS — I4510 Unspecified right bundle-branch block: Secondary | ICD-10-CM

## 2017-12-08 DIAGNOSIS — Z79890 Hormone replacement therapy (postmenopausal): Secondary | ICD-10-CM

## 2017-12-08 LAB — BASIC METABOLIC PANEL
ANION GAP: 13 (ref 5–15)
BUN: 13 mg/dL (ref 8–23)
CHLORIDE: 98 mmol/L (ref 98–111)
CO2: 25 mmol/L (ref 22–32)
Calcium: 9 mg/dL (ref 8.9–10.3)
Creatinine, Ser: 0.82 mg/dL (ref 0.61–1.24)
Glucose, Bld: 100 mg/dL — ABNORMAL HIGH (ref 70–99)
POTASSIUM: 4.1 mmol/L (ref 3.5–5.1)
SODIUM: 136 mmol/L (ref 135–145)

## 2017-12-08 LAB — CBC
HCT: 37 % — ABNORMAL LOW (ref 39.0–52.0)
HEMOGLOBIN: 12 g/dL — AB (ref 13.0–17.0)
MCH: 27.7 pg (ref 26.0–34.0)
MCHC: 32.4 g/dL (ref 30.0–36.0)
MCV: 85.5 fL (ref 78.0–100.0)
Platelets: 285 10*3/uL (ref 150–400)
RBC: 4.33 MIL/uL (ref 4.22–5.81)
RDW: 15.4 % (ref 11.5–15.5)
WBC: 10.9 10*3/uL — AB (ref 4.0–10.5)

## 2017-12-08 NOTE — Progress Notes (Signed)
Pt pulling condom cath and trying to get out of bed.

## 2017-12-08 NOTE — Evaluation (Signed)
Occupational Therapy Evaluation Patient Details Name: [name] MRN: [id number] DOB: 1934-10-15 Today's Date: 12/08/2017    History of Present Illness Pt is an 83 year old man admitted from a memory care unit with frequent falls and loose stools. + R 7th rib fx, L fx of radial head and neck, T12 and L1 acute vs subacute end plate fxs, scalp laceration with staples, facial bruising. PMH: advanced dementia, tachy/brady syndrome, afib, pacemaker. HTN,CAD, COPD.   Clinical Impression   Pt typically walks without a device and is supervised for ADL at his memory care facility. He has had multiple recent falls. Pt presents with baseline cognitive impairment, R rib pain and decreased standing balance requiring hand held assist. Pt needs min to max assist for ADL. Recommending SNF for rehab upon discharge. Will follow acutely.    Follow Up Recommendations  SNF;Supervision/Assistance - 24 hour    Equipment Recommendations       Recommendations for Other Services       Precautions / Restrictions Precautions Precautions: Fall      Mobility Bed Mobility Overal bed mobility: Needs Assistance Bed Mobility: Supine to Sit     Supine to sit: Min assist        Transfers Overall transfer level: Needs assistance Equipment used: 1 person hand held assist Transfers: Sit to/from Stand Sit to Stand: Min assist         General transfer comment: assist to rise and steady    Balance Overall balance assessment: Needs assistance   Sitting balance-Leahy Scale: Fair       Standing balance-Leahy Scale: Poor Standing balance comment: requires min to min guard assist                           ADL either performed or assessed with clinical judgement   ADL Overall ADL's : Needs assistance/impaired Eating/Feeding: Set up;Sitting   Grooming: Maximal assistance;Wash/dry hands;Standing   Upper Body Bathing: Moderate assistance;Sitting   Lower Body Bathing: Maximal  assistance;Sit to/from stand   Upper Body Dressing : Sitting;Maximal assistance   Lower Body Dressing: Maximal assistance;Sit to/from stand Lower Body Dressing Details (indicate cue type and reason): pain with attempt to don sock Toilet Transfer: Minimal assistance;Ambulation           Functional mobility during ADLs: Minimal assistance(hand held)       Vision Baseline Vision/History: Wears glasses Patient Visual Report: No change from baseline       Perception     Praxis      Pertinent Vitals/Pain Pain Assessment: Faces Faces Pain Scale: Hurts even more Pain Location: R rib area Pain Descriptors / Indicators: Grimacing;Guarding Pain Intervention(s): Monitored during session;Repositioned;Other (comment)(educated family in splinting with pillow)     Hand Dominance Right   Extremity/Trunk Assessment Upper Extremity Assessment Upper Extremity Assessment: RUE deficits/detail;LUE deficits/detail RUE Deficits / Details: arthritic changes in hand RUE Coordination: decreased fine motor LUE Deficits / Details: splinted/ace wrapped from MPs to proximal of elbow LUE: Unable to fully assess due to immobilization LUE Coordination: decreased fine motor;decreased gross motor   Lower Extremity Assessment Lower Extremity Assessment: Defer to PT evaluation   Cervical / Trunk Assessment Cervical / Trunk Assessment: Other exceptions;Kyphotic(pt with R rib fx)   Communication Communication Communication: HOH   Cognition Arousal/Alertness: Awake/alert Behavior During Therapy: Impulsive;Restless Overall Cognitive Status: History of cognitive impairments - at baseline  General Comments: pt with baseline advanced dementia with intermittent agitation   General Comments       Exercises     Shoulder Instructions      Home Living Family/patient expects to be discharged to:: Assisted living(Memory Care facility)                                         Prior Functioning/Environment Level of Independence: Needs assistance  Gait / Transfers Assistance Needed: walks without a device, multiple falls ADL's / Homemaking Assistance Needed: supervision for ADL            OT Problem List: Decreased strength;Decreased activity tolerance;Impaired balance (sitting and/or standing);Decreased coordination;Decreased cognition;Decreased safety awareness;Pain;Impaired UE functional use      OT Treatment/Interventions: Self-care/ADL training;DME and/or AE instruction;Patient/family education;Balance training;Therapeutic activities;Cognitive remediation/compensation    OT Goals(Current goals can be found in the care plan section) Acute Rehab OT Goals Patient Stated Goal: agreeable to OOB OT Goal Formulation: Patient unable to participate in goal setting Time For Goal Achievement: 12/22/17 Potential to Achieve Goals: Good ADL Goals Pt Will Perform Grooming: with min assist;standing Pt Will Perform Upper Body Dressing: with min assist;sitting Pt Will Perform Lower Body Dressing: with min assist;sit to/from stand Pt Will Transfer to Toilet: with min guard assist;ambulating Pt Will Perform Toileting - Clothing Manipulation and hygiene: with min assist;sit to/from stand  OT Frequency: Min 2X/week   Barriers to D/C:            Co-evaluation PT/OT/SLP Co-Evaluation/Treatment: Yes Reason for Co-Treatment: For patient/therapist safety          AM-PAC PT "6 Clicks" Daily Activity     Outcome Measure Help from another person eating meals?: A Little Help from another person taking care of personal grooming?: A Lot Help from another person toileting, which includes using toliet, bedpan, or urinal?: A Lot Help from another person bathing (including washing, rinsing, drying)?: A Lot Help from another person to put on and taking off regular upper body clothing?: A Lot Help from another person to put on and taking off  regular lower body clothing?: A Lot 6 Click Score: 13   End of Session Equipment Utilized During Treatment: Gait belt  Activity Tolerance: Patient limited by pain Patient left: in chair;with call bell/phone within reach;with chair alarm set;with family/visitor present;with nursing/sitter in room  OT Visit Diagnosis: Unsteadiness on feet (R26.81);Other abnormalities of gait and mobility (R26.89);Pain;Other symptoms and signs involving cognitive function;History of falling (Z91.81);Muscle weakness (generalized) (M62.81)                Time: 2725-3664 OT Time Calculation (min): 31 min Charges:  OT Evaluation $OT Eval Low Complexity: 1 Low $OT Eval Moderate Complexity: 1 Mod  12/08/2017 Nestor Lewandowsky, OTR/L Pager: [phone]  Werner Lean, Haze Boyden 12/08/2017, 11:00 AM

## 2017-12-08 NOTE — Progress Notes (Signed)
  Date: 12/08/2017  Patient name: [name]  Medical record number: [id number]  Date of birth: 29-Nov-1934   I have seen and evaluated [name] and discussed their care with the Residency Team. [name] is an 83 yo man residing in an ALF memory unit.  He has been having frequent falls over the past week resulting in a L elbow injury with possible subtle nondisplaced fracture of the junction of the radial head and neck, posterior head laceration which was stapled, and R rib 7 fractures.  Some are witnessed and some are unwitnessed.  He had another fall on the day of admission which led to his admission.  He has a report of 3 to 4 months of loose stools and his gastroenterologist, who manages his ulcerative colitis, is working him up for C. difficile.  In the ED, the family expressed a desire that he be moved to a SNF.  PMHx, Fam Hx, and/or Soc Hx : Dementia, tachybradycardia syndrome with pacer, atrial fibrillation, hypertension, CAD, hyperlipidemia, ulcerative colitis, hypothyroidism, COPD, recurrent falls.  Vitals:   12/08/17 0600 12/08/17 1329  BP: 135/84 (!) 153/85  Pulse: 88 91  Resp: 20 20  Temp: 98.4 F (36.9 C) 98.7 F (37.1 C)  SpO2: 97% 93%  Gen lying in bed, NAD H irr irr systolic murmur L clear anteriorly, pain limited positioning for exam ABD + BS Left arm in splint  WBC 10.8 - 10.9 HgB 11.6 - 12.0 TSH nl UA negative  I personally viewed the CXR images and confirmed my reading with the official read. 2 view, AP and lateral, rotated, dual lead pacer, mild edema  I personally viewed the EKG and confirmed my reading with the official read. A flutter, RBBB  Assessment and Plan: I have seen and evaluated the patient as outlined above. I agree with the formulated Assessment and Plan as detailed in the residents' note, with the following changes: [name] is an 83 yo man residing in an ALF memory unit with recent frequent falls resulting in a L elbow injury with  possible subtle nondisplaced fracture of the junction of the radial head and neck, posterior head laceration which was stapled, and R rib 7 fractures.  He also has had 3 months of loose stools and with his history of ulcerative colitis, a C. difficile infection needs to be ruled out.  It has yet to be collected.   1.  Frequent falls with injury -we have consulted PT and OT and the family is in agreement for SNF placement.  2.  Osteoporosis with fragility fractures -he has a right seventh rib fracture and compression fractures of T8 12 and L1.  He should be on calcium, vitamin D, and alendronate.  3.  Loose stools in the setting of ulcerative colitis -a C. difficile infection needs to be excluded.  He does not seem to be volume contracted from the frequent stools and in fact might be slightly volume overloaded per his chest x-ra but most recent EF was nl.  William Crews, MD 7/31/20193:40 PM

## 2017-12-08 NOTE — Evaluation (Signed)
Physical Therapy Evaluation Patient Details Name: [name] MRN: [id number] DOB: 03-Aug-1934 Today's Date: 12/08/2017   History of Present Illness  Pt is an 83 year old man admitted from a memory care unit with frequent falls and loose stools. + R 7th rib fx, L fx of radial head and neck, T12 and L1 acute vs subacute end plate fxs, scalp laceration with staples, facial bruising. PMH: advanced dementia, tachy/brady syndrome, afib, pacemaker. HTN,CAD, COPD.    Clinical Impression  Pt admitted with above diagnosis. Pt currently with functional limitations due to the deficits listed below (see PT Problem List). PTA pt resided in memory care unit at ALF. He was independent with mobility but has had 3 falls in the past 2 weeks. On eval, he required min assist bed mobility, min assist sit to stand, and min HHA 35 feet (+2 utilized for safety due to cognition).  Pt will benefit from skilled PT to increase their independence and safety with mobility to allow discharge to the venue listed below.       Follow Up Recommendations SNF;Supervision/Assistance - 24 hour    Equipment Recommendations  Other (comment)(defer to next venue)    Recommendations for Other Services       Precautions / Restrictions Precautions Precautions: Fall      Mobility  Bed Mobility Overal bed mobility: Needs Assistance Bed Mobility: Supine to Sit     Supine to sit: Min assist     General bed mobility comments: cues for sequencing, increased time and effort  Transfers Overall transfer level: Needs assistance Equipment used: 1 person hand held assist Transfers: Sit to/from Stand Sit to Stand: Min assist         General transfer comment: assist to rise and steady  Ambulation/Gait Ambulation/Gait assistance: Min assist;+2 safety/equipment Gait Distance (Feet): 35 Feet Assistive device: 1 person hand held assist Gait Pattern/deviations: Step-through pattern;Decreased stride length;Trunk flexed Gait  velocity: decreased   General Gait Details: distance limited by pain. +2 utilized for safety. Pt can be difficult to redirect.   Stairs            Wheelchair Mobility    Modified Rankin (Stroke Patients Only)       Balance Overall balance assessment: Needs assistance Sitting-balance support: No upper extremity supported;Feet supported Sitting balance-Leahy Scale: Fair     Standing balance support: During functional activity;Single extremity supported Standing balance-Leahy Scale: Poor Standing balance comment: requires min to min guard assist                             Pertinent Vitals/Pain Pain Assessment: Faces Faces Pain Scale: Hurts even more Pain Location: R flank Pain Descriptors / Indicators: Grimacing;Guarding Pain Intervention(s): Monitored during session;Limited activity within patient's tolerance;Repositioned;Other (comment)(educated family in splinting with pillow)    Home Living Family/patient expects to be discharged to:: Assisted living(memory care unit)                      Prior Function Level of Independence: Needs assistance   Gait / Transfers Assistance Needed: walks without a device, multiple falls  ADL's / Homemaking Assistance Needed: supervision for ADL        Hand Dominance   Dominant Hand: Right    Extremity/Trunk Assessment   Upper Extremity Assessment Upper Extremity Assessment: Defer to OT evaluation RUE Deficits / Details: arthritic changes in hand RUE Coordination: decreased fine motor LUE Deficits / Details: splinted/ace wrapped  from MPs to proximal of elbow LUE: Unable to fully assess due to immobilization LUE Coordination: decreased fine motor;decreased gross motor    Lower Extremity Assessment Lower Extremity Assessment: Generalized weakness    Cervical / Trunk Assessment Cervical / Trunk Assessment: Other exceptions;Kyphotic(R rib fx)  Communication   Communication: HOH  Cognition  Arousal/Alertness: Awake/alert Behavior During Therapy: Impulsive;Restless Overall Cognitive Status: History of cognitive impairments - at baseline                                 General Comments: pt with baseline advanced dementia with intermittent agitation      General Comments      Exercises     Assessment/Plan    PT Assessment Patient needs continued PT services  PT Problem List Decreased strength;Decreased mobility;Decreased safety awareness;Decreased knowledge of precautions;Decreased activity tolerance;Decreased cognition;Pain;Decreased balance       PT Treatment Interventions Therapeutic activities;Gait training;Therapeutic exercise;Patient/family education;Balance training;Functional mobility training    PT Goals (Current goals can be found in the Care Plan section)  Acute Rehab PT Goals Patient Stated Goal: agreeable to OOB PT Goal Formulation: With patient/family Time For Goal Achievement: 12/22/17 Potential to Achieve Goals: Fair    Frequency Min 2X/week   Barriers to discharge        Co-evaluation PT/OT/SLP Co-Evaluation/Treatment: Yes Reason for Co-Treatment: For patient/therapist safety PT goals addressed during session: Mobility/safety with mobility         AM-PAC PT "6 Clicks" Daily Activity  Outcome Measure Difficulty turning over in bed (including adjusting bedclothes, sheets and blankets)?: A Little Difficulty moving from lying on back to sitting on the side of the bed? : A Lot Difficulty sitting down on and standing up from a chair with arms (e.g., wheelchair, bedside commode, etc,.)?: A Lot Help needed moving to and from a bed to chair (including a wheelchair)?: A Little Help needed walking in hospital room?: A Little Help needed climbing 3-5 steps with a railing? : A Lot 6 Click Score: 15    End of Session Equipment Utilized During Treatment: Gait belt Activity Tolerance: Patient tolerated treatment well Patient left: in  chair;with call bell/phone within reach;with nursing/sitter in room;with family/visitor present;with chair alarm set Nurse Communication: Mobility status PT Visit Diagnosis: Unsteadiness on feet (R26.81);Repeated falls (R29.6);Muscle weakness (generalized) (M62.81)    Time: 1610-9604 PT Time Calculation (min) (ACUTE ONLY): 25 min   Charges:   PT Evaluation $PT Eval Moderate Complexity: 1 Mod          Lorrin Goodell, PT  Office # [phone] Pager [phone]   Lorriane Shire 12/08/2017, 11:50 AM

## 2017-12-08 NOTE — Progress Notes (Signed)
   Subjective: Hospital Day 1  [name] was examined at bedside today and was seen comfortably lying in bed.  He was engaging in conversation normally and answering " I do not think so" to assessment questionnaire. He only complains of pain with movement but denies any pain at rest, diarrhea, chest pain, headaches or vision changes.  Objective:  Vital signs in last 24 hours: Vitals:   12/07/17 1721 12/07/17 2129 12/08/17 0600 12/08/17 1329  BP: 140/83 (!) 145/99 135/84 (!) 153/85  Pulse: 80 96 88 91  Resp: 20 (!) 24 20 20   Temp: 98.2 F (36.8 C) 98.1 F (36.7 C) 98.4 F (36.9 C) 98.7 F (37.1 C)  TempSrc: Oral Oral Oral Oral  SpO2: 97% 95% 97% 93%   Physical exam Constitutional: in NAD HEENT: Contusion on right side of face CV: RRR, no MGR. Pacemaker on left upper chest Resp: CTAB Abd: BS+, non tender to palpation  Extremity: Left arm wrapped in splint.   Assessment/Plan:  Active Problems:   Fall  [name] is an 83 year old male with past medical history significant for advanced dementia, tachybradycardia syndrome, atrial fibrillation with pacemaker, hypertension, CAD, hyperlipidemia, ulcerative colitis, hypothyroidism and COPD here for frequent falls with resultant left elbow injury, multiple contusions and occipital laceration.  Also presented with history of " diarrhea" which has resolved since admission.  Traumatic fall Resultant right lateral rib #7 fracture, T12 and L1 endplate fracture, nondisplaced fracture of the radial head and neck, soft tissue hematoma in the right supraorbital region. Currnetly denies any pain at rest but will experience some pain with movement. -Consider bisphosphonate therapy at discharge -Tylenol 650 mg q6 prn, OxyIR 5 mg q4prn -Negative orthostatics -PT/OT: SNF  Advanced dementia Bowel and urinary incontinence No reports of diarrhea or loose stool by the patient or nurse since admission.  Denies fever or abdominal pain. -GI panel PCR  pending -C. difficile PCR pending -Continue Xanax, melatonin, Zoloft, Imodium prn -HOLD cholestyramine  -Delirium precaution  Atrial fibrillation Tachybradycardia syndrome -s/p pacemaker placement -Continue metoprolol 50 mg BID  Coronary artery disease -Continue aspirin 81 mg  Hypothyroidism -Synthroid 50 mcg daily  Hypertension BP with SBP 130s-150s -Continue metoprolol 50 mg p.o. BID  Displacement: CSW, case management and daughter [name] currently working on SNF placement   FEN: No fluids, replace electrolytes as needed, heart healthy VTE prophylaxis: SCD CODE STATUS: DNR  Dispo: To SNF once stable  William Rosenthal, MD 12/08/2017, 2:47 PM Pager: [phone]

## 2017-12-09 ENCOUNTER — Encounter (HOSPITAL_COMMUNITY): Payer: Self-pay | Admitting: General Practice

## 2017-12-09 MED ORDER — CALCIUM CITRATE 950 (200 CA) MG PO TABS
200.0000 mg | ORAL_TABLET | Freq: Every day | ORAL | Status: DC
  Administered 2017-12-09 – 2017-12-10 (×2): 200 mg via ORAL
  Filled 2017-12-09 (×2): qty 1

## 2017-12-09 MED ORDER — ENSURE ENLIVE PO LIQD: 237.0000 mL | Freq: Two times a day (BID) | ORAL | Status: DC

## 2017-12-09 MED ORDER — VITAMIN D 1000 UNITS PO TABS
1000.0000 [IU] | ORAL_TABLET | Freq: Every day | ORAL | Status: DC
  Administered 2017-12-09 – 2017-12-10 (×2): 1000 [IU] via ORAL
  Filled 2017-12-09 (×2): qty 1

## 2017-12-09 MED ORDER — PHENAZOPYRIDINE HCL 100 MG PO TABS
100.0000 mg | ORAL_TABLET | Freq: Three times a day (TID) | ORAL | Status: DC
  Administered 2017-12-09 – 2017-12-10 (×4): 100 mg via ORAL
  Filled 2017-12-09 (×5): qty 1

## 2017-12-09 NOTE — Progress Notes (Addendum)
2:58pm-CSW presented bed offers to patient's daughter. After reviewing the facility options, patient's daughter would prefer for patient to return to the memory care facility with home health PT and a private sitter ([facility]). CSW spoke with nurse there [facility]). She stated she will need to speak with their director due to the patient's fractures and call CSW back.   10:58am-CSW spoke with patient's daughter, [name] at bedside. CSW answered her questions about SNF versus returning to memory care with privately paid sitters. CSW explained that SNFs do not have measures in place to closely monitor patients with dementia. She expressed interest in [facility] but they do not have availability. CSW will update patient's daughter with facility options for potential discharge tomorrow.   William Locus Miro Balderson LCSW [phone]

## 2017-12-09 NOTE — Progress Notes (Signed)
Pt has laceration on back of head, closed with staples, while changing pt's linen, there was a small amount of dried up blood on pillowcase. Pt did not allow RN to assess his head, pushed RN away and kicked his legs up. Did not allow RN or NT to apply condom catheter either. Changed pt's linens and settled pt down in bed. Pt calmed down and is resting in bed with bed alarm on. Pt's daughter called unit and RN gave update on pt status. Will continue to monitor pt.

## 2017-12-09 NOTE — Discharge Summary (Addendum)
Name: [name] MRN: [id number] DOB: 07-19-1934 83 y.o. PCP: [facility]  Date of Admission: 12/07/2017  9:37 AM Date of Discharge: 12/10/17 Attending Physician: William Crews, MD  Discharge Diagnosis: 1. Multiple bone fractures 2/2 traumatic fall 2. Diarrhea  Discharge Medications: Allergies as of 12/10/2017      Reactions   Nsaids    Colitis, upset stomach      Medication List    STOP taking these medications   cholestyramine 4 g packet Commonly known as:  QUESTRAN     TAKE these medications   acetaminophen 325 MG tablet Commonly known as:  TYLENOL Take 2 tablets (650 mg total) by mouth every 6 (six) hours as needed for mild pain (or Fever >/= 101).   ALPRAZolam 0.25 MG tablet Commonly known as:  XANAX Take 1 tablet (0.25 mg total) by mouth 3 (three) times daily as needed for anxiety (agitation (control)).   aspirin EC 81 MG tablet Take 81 mg by mouth daily.   bacitracin ointment Apply 1 application topically 2 (two) times daily. Please apply to the left arm where skin tears are. What changed:  additional instructions   calcium citrate 950 MG tablet Commonly known as:  CALCITRATE - dosed in mg elemental calcium Take 1 tablet (200 mg of elemental calcium total) by mouth daily. Start taking on:  12/11/2017   cetaphil cream Apply 1 application topically daily. Apply all over skin   Cholecalciferol 1000 units tablet Take 1 tablet (1,000 Units total) by mouth daily. Start taking on:  12/11/2017   levothyroxine 50 MCG tablet Commonly known as:  SYNTHROID, LEVOTHROID Take 50 mcg by mouth daily before breakfast.   loperamide 2 MG tablet Commonly known as:  IMODIUM A-D Take 2 mg by mouth 4 (four) times daily as needed for diarrhea or loose stools.   Melatonin 3 MG Tabs Take 3 mg by mouth at bedtime.   metoprolol tartrate 50 MG tablet Commonly known as:  LOPRESSOR TAKE ONE TABLET BY MOUTH TWICE A DAY What changed:    how much to  take  how to take this  when to take this   oxyCODONE 5 MG immediate release tablet Commonly known as:  Oxy IR/ROXICODONE Take 1 tablet (5 mg total) by mouth every 8 (eight) hours as needed for up to 3 days for moderate pain.   sertraline 50 MG tablet Commonly known as:  ZOLOFT Take 50 mg by mouth daily.            Durable Medical Equipment  (From admission, onward)        Start     Ordered   12/10/17 1140  For home use only DME Hospital bed  Once    Question Answer Comment  Patient has (list medical condition): Advanced dementia, multiple falls with fragility fractures   The above medical condition requires: Patient requires the ability to reposition frequently   Bed type Semi-electric      12/10/17 1145      Disposition and follow-up:   Mr.Md [name] was discharged from [facility] in Stable condition.  At the hospital follow up visit please address:  1.  Pain control of his fractures.  Any new or worsening episodes of loose stools.  2.  Labs / imaging needed at time of follow-up: None  3.  Pending labs/ test needing follow-up: None  Follow-up Appointments: Contact information for after-discharge care    Destination    [facility] .   Service:  Assisted Living Contact information: [place] [phone]             Orthopedic follow up next week. Discharged to [facility] Course by problem list: 1. Fractures 2/2 traumatic falls. Patient was admitted from a memory care, [facility], where he was found on the floor after an unwitnessed fall. He has had multiple frequent falls and subsequent ED visits. His CT abdomen showed a right lateral rib fracture, possibly rib 7 and superior endplate fractures at W54 and L1. He does have a history of atrial fibrillation on a pacemaker but here were no reports of arrhythmias or palpitations. His orthostatics were  negative. This was likely due to his advanced age or his advanced dementia. He does have fragility fractures possibly due to osteoporosis. We checked a vitamin D level which was 53.5. We started him on vitamin D 1,000 and calcium citrate 949m (2032melemental). Discussed with daughters about starting alendronate therapy, did not want to start at this time due to the adverse effects but will discuss with orthopedics when he see's them next week. He was put on Oxy IR 54m23m4 hours and tylenol for pain. PT/OT evaluated and recommended discharge to a SNF however after discussion with daughters they prefer him to be discharged back to memory care unit with home health and PT. Patient will follow up with orthopedics next week.   2. Diarrhea. Patients daughter reported diarrhea prior to admission. He has a history of ulcerative colitis and had recently been evaluated by an gastroenterologist and was found to be negative for C. Difficile. He had a GI panel PCR which was negative. He did not have any bowel movements while in the hospital and a repeat C. Diff pcr was unable to be completed. Patient was afebrile during admission.   3. Atrial fibrillation, tachybradycardia: Patient has a pacemaker in place. Continued metoprolol during admission.   4. Hypothyroidism: Continued home synthroid  5. Hypertension: Continued home metoprolol  Discharge Vitals:   BP 129/83 (BP Location: Right Arm)   Pulse 83   Temp 98 F (36.7 C) (Oral)   Resp 18   SpO2 94%   Pertinent Labs, Studies, and Procedures:  Head CT:  No acute intracranial abnormality.  Redemonstration of soft tissue hematoma in the right supraorbital region.  Redemonstration of surgical clips in the right occipital region.  Evidence of chronic microvascular ischemic disease and associated intracranial atherosclerosis.  Cervical CT:  No acute fracture or malalignment of the cervical spine.  Multilevel disc disease, as above.  CT  abdomen/pelvis:  IMPRESSION: No traumatic intra abdominal or pelvic finding.  Right lateral rib fracture, possibly rib 7. No pneumothorax or hemothorax or evidence of liver injury.  Superior endplate fractures at T12O27d L1 with minor loss of height that could be acute or subacute.  Mild fatty change of the liver.  Small gallstones.  Aortic atherosclerosis.  Lumbar curvature and degenerative changes.    Discharge Instructions: Discharge Instructions    Call MD for:  severe uncontrolled pain   Complete by:  As directed    Diet - low sodium heart healthy   Complete by:  As directed    Increase activity slowly   Complete by:  As directed       Signed: KriAsencion NobleD 12/10/2017, 4:18 PM   Pager: [phone]

## 2017-12-09 NOTE — Progress Notes (Signed)
   Subjective: Patients daughter present at bedside. She reports that he was having pain when the condom catheter was being placed and was requesting pyridium to help with that. He reported feeling okay today, with no acute events overnight. He was alert and oriented to person.   Objective:  Vital signs in last 24 hours: Vitals:   12/08/17 1329 12/08/17 1959 12/08/17 2127 12/09/17 0245  BP: (!) 153/85 (!) 160/92 (!) 155/99 (!) 154/114  Pulse: 91 (!) 102 (!) 102 86  Resp: 20 18    Temp: 98.7 F (37.1 C) 97.6 F (36.4 C)  98.3 F (36.8 C)  TempSrc: Oral Oral  Oral  SpO2: 93% 94%  94%   General: Patient resting in bed, NAD, appears stated age HEENT: Conjunctival hemorrhage in right medial eye, PERRLA Cardiac: RRR, no murmurs, rubs or gallops, radial pulses 2+, pacemaker in place in right upper sternal border Pulmonary: Lungs CTA bilaterally, non-labored breathing Extremities: Right forearm was in a cast, no lower extremity edema Abdomen: Bowel sounds present, non-tender, no masses Neurology: Moves all extremities, alert and oriented to person  Assessment/Plan: This is a 83 year old male with a PMH of advanced dementia, tachybradycardia syndrome, atrial fibrillation (pacemaker), HTN, CAD, HLD, ulcerative colitis, hypothyroidism, and COPD presenting with frequent falls and loose stools.  Fall, rib fracture: Patient has had multiple falls in the past 2 weeks resulting with right facial contusion, elbow injury, rib fracutre, and occipital contusion. He is coming from a memory care facility. He was found on the ground after an unwitnessed fall. He has a history of atrial fibrillation with a pacemaker however there were no reports of dysrhthmia or palpitations. Orthostatics were negative. This was likely due to generalized weakness secondary to advanced age vs advanced dementia. There does seem to be underlying osteoporosis given the multiple fragility fractures in the right seventh rib fracture  and compression fractures. Will check a vitamin D level and consider starting bisphosphonate treatment.  -Tylenol PRN for pain -Oxy IR 9m q4 PRN for pain -PT/OT evaluated and recommended SNF -CSW on board, looking into SNF placement tomorrow -F/U vitamin D level   #Bowel and urinary incontinence: Patient has a 3-4 month history of loose stools and urinary incontinence. He does have a history of ulcerative colitis. Daughter reported [place]. He has not had any diarrhea while in the hospital. He also had a condom catheter placed yesterday for his urination issues which resulted I significant discomfort. -GI panel PCR negative -Continue Xanax 0.25 mg TID prn -Continue Melatonin 3 mg p.o. Daily -Continue Imodium q4 PRN for diarrhea -Continue Zoloft 50 mg daily -Start pyridium TID with meals  #Dementia:  -Sitter in place -Delirium precautions  #Tachybradycardia syndrome:  Patient has a pPsychologist, forensicin place.   #Atrial fibrillation Patient is rate controlled on EKG. Is on metoprolol at home. -Continue metoprolol 50 mg BID  #HTN: Patient has been hypertensive while hospitalized. He is on metoprolol at home.  -Continue home metoprolol 5101mBID -Monitor vitals  #CAD: Continue ASA 81 mg  #Hypothyroidism: Patient is on synthroid at home. Continue synthroid 50 mcg daily  FEN: No fluids, replete lytes prn, heart healthy diet  VTE ppx: SCDs Code Status:DNR  Dispo: CSW, case management, and daughter LoCecille Rubinorking on SNF placement   KrAsencion NobleMD 12/09/2017, 6:25 AM Pager: [phone]

## 2017-12-10 DIAGNOSIS — K5900 Constipation, unspecified: Secondary | ICD-10-CM

## 2017-12-10 DIAGNOSIS — S2231XA Fracture of one rib, right side, initial encounter for closed fracture: Secondary | ICD-10-CM

## 2017-12-10 DIAGNOSIS — S0083XD Contusion of other part of head, subsequent encounter: Secondary | ICD-10-CM

## 2017-12-10 DIAGNOSIS — M4855XD Collapsed vertebra, not elsewhere classified, thoracolumbar region, subsequent encounter for fracture with routine healing: Secondary | ICD-10-CM

## 2017-12-10 DIAGNOSIS — K51818 Other ulcerative colitis with other complication: Secondary | ICD-10-CM

## 2017-12-10 DIAGNOSIS — N342 Other urethritis: Secondary | ICD-10-CM

## 2017-12-10 DIAGNOSIS — J988 Other specified respiratory disorders: Secondary | ICD-10-CM

## 2017-12-10 DIAGNOSIS — W19XXXD Unspecified fall, subsequent encounter: Secondary | ICD-10-CM

## 2017-12-10 DIAGNOSIS — S52125D Nondisplaced fracture of head of left radius, subsequent encounter for closed fracture with routine healing: Secondary | ICD-10-CM

## 2017-12-10 DIAGNOSIS — S0003XD Contusion of scalp, subsequent encounter: Secondary | ICD-10-CM

## 2017-12-10 DIAGNOSIS — T07XXXA Unspecified multiple injuries, initial encounter: Secondary | ICD-10-CM

## 2017-12-10 DIAGNOSIS — S2231XD Fracture of one rib, right side, subsequent encounter for fracture with routine healing: Secondary | ICD-10-CM

## 2017-12-10 LAB — VITAMIN D 25 HYDROXY (VIT D DEFICIENCY, FRACTURES): Vit D, 25-Hydroxy: 53.5 ng/mL (ref 30.0–100.0)

## 2017-12-10 MED ORDER — ALPRAZOLAM 0.25 MG PO TABS: 0.2500 mg | ORAL_TABLET | Freq: Three times a day (TID) | ORAL | 0 refills | Status: AC | PRN

## 2017-12-10 MED ORDER — BACITRACIN ZINC 500 UNIT/GM EX OINT: 1.0000 "application " | TOPICAL_OINTMENT | Freq: Two times a day (BID) | CUTANEOUS | 0 refills | Status: DC

## 2017-12-10 MED ORDER — ACETAMINOPHEN 325 MG PO TABS: 650.0000 mg | ORAL_TABLET | Freq: Four times a day (QID) | ORAL | 3 refills | Status: AC | PRN

## 2017-12-10 MED ORDER — OXYCODONE HCL 5 MG PO TABS: 5.0000 mg | ORAL_TABLET | Freq: Three times a day (TID) | ORAL | 0 refills | Status: AC | PRN

## 2017-12-10 MED ORDER — CALCIUM CITRATE 950 (200 CA) MG PO TABS: 200.0000 mg | ORAL_TABLET | Freq: Every day | ORAL | 3 refills | Status: DC

## 2017-12-10 MED ORDER — SENNOSIDES-DOCUSATE SODIUM 8.6-50 MG PO TABS
1.0000 | ORAL_TABLET | Freq: Once | ORAL | Status: AC
  Administered 2017-12-10: 1 via ORAL
  Filled 2017-12-10: qty 1

## 2017-12-10 MED ORDER — BACITRACIN ZINC 500 UNIT/GM EX OINT: 1.0000 "application " | TOPICAL_OINTMENT | Freq: Two times a day (BID) | CUTANEOUS | 0 refills | Status: AC

## 2017-12-10 MED ORDER — CHOLECALCIFEROL 25 MCG (1000 UT) PO TABS: 1000.0000 [IU] | ORAL_TABLET | Freq: Every day | ORAL | 3 refills | Status: AC

## 2017-12-10 NOTE — Progress Notes (Signed)
[facility] is able to accept patient back and family going to hire a Charity fundraiser to stay with patient. Awaiting delivery of hospital bed to facility before discharge.   William Locus Chalyn Amescua LCSW [phone]

## 2017-12-10 NOTE — Progress Notes (Signed)
Patient will DC to: [facility] Anticipated DC date: 12/10/17 Family notified: Daughter, Brewing technologist by: Corey Harold 6pm (hospital bed should be delivered in an hour)   Per MD patient ready for DC to [facility]. RN, patient, patient's family, and facility notified of DC. Discharge Summary and FL2 faxed to facility. RN given number for report [phone]). DC packet on chart, including DC Summary and FL2. Ambulance transport requested for patient.   CSW signing off.  Cedric Fishman, LCSW Clinical Social Worker [phone]

## 2017-12-10 NOTE — Progress Notes (Signed)
..      Durable Medical Equipment  (From admission, onward)        Start     Ordered   12/10/17 1140  For home use only DME Hospital bed  Once    Question Answer Comment  Patient has (list medical condition): Advanced dementia, multiple falls with fragility fractures   The above medical condition requires: Patient requires the ability to reposition frequently   Bed type Semi-electric      12/10/17 1145

## 2017-12-10 NOTE — Discharge Instructions (Signed)
[name],   It has been a pleasure working with you and we are glad you're feeling better. You were hospitalized for your history of falls resulting in multiple bone fractures and episodes of diarrhea. We think that these falls are due to your poor balance, generalized weakness, and underlying dementia.  For the fractures,  START taking Vitamin D and Calcium supplements.  Discuss with orthopedics the possibility of starting on alendronate therapy.  For your pain,  START taking tylenol as need for moderate pain. START taking oxycodone IR 60m every 8 hours as needed for significant pain only for the next 5 days.   For the diarrhea,  STOP taking cholestyramine.  Follow up with your primary care provider in 1-2 weeks  If your symptoms worsen or you develop new symptoms, please seek medical help whether it is your primary care provider or emergency department.  If you have any questions about this hospitalization please call [phone]

## 2017-12-10 NOTE — Progress Notes (Signed)
Pt's family given discharge instructions and prescriptions. IV removed. Pt escorted to [facility] facility via family.   Vickie Epley, RN

## 2017-12-10 NOTE — Care Management Note (Signed)
Case Management Note  Patient Details  Name: [name] MRN: [id number] Date of Birth: 1934/07/29  Subjective/Objective:   Fall, rib fracture, bowel and urinary incontinence                  Action/Plan: NCM contacted [facility] # [phone] at [place], [address] [place]. Pt needs hospital bed. Contacted [facility] for hospital bed to be delivered to day to ALF.     Expected Discharge Date:  12/10/2017               Expected Discharge Plan:  Assisted Living / Rest Home([facility]/ memory care unit)  In-House Referral:  Clinical Social Work  Discharge planning Services  CM Consult  Post Acute Care Choice:  NA Choice offered to:  NA  DME Arranged:  Hospital bed DME Agency:  [place]:  NA [place] Agency:  NA  Status of Service:  Completed, signed off  If discussed at [place] of Stay Meetings, dates discussed:    Additional Comments:  Erenest Rasher, RN 12/10/2017, 12:08 PM

## 2017-12-10 NOTE — Progress Notes (Signed)
   Subjective: Patient was resting in bed, appeared comfortable and more awake and alert today. No acute events overnight. Patients daughter at bedside. He stated that he slept well overnight. He denied any pain or shortness of breath. Daughter reports that the pyridium that he was given seemed to help with his pain. He has not had a bowel movement since admission so Senokot was ordered earlier today.   Objective:  Vital signs in last 24 hours: Vitals:   12/09/17 0245 12/09/17 0654 12/09/17 1100 12/09/17 2041  BP: (!) 154/114 (!) 152/81 (!) 154/82 (!) 142/86  Pulse: 86 73 72 97  Resp:  18 18 16   Temp: 98.3 F (36.8 C) 98 F (36.7 C) 98.7 F (37.1 C) 98.2 F (36.8 C)  TempSrc: Oral Oral Oral Oral  SpO2: 94% 94% 95% (!) 88%   General: Patient resting in bed, NAD, appears his stated age HEENT: Right conjunctival hemorrhage, PERRLA Cardiac: RRR, no murmurs, rubs or gallops. 2+ pulses though out, pacemaker in place in right upper sternal border Pulmonary: Lungs CTA, non-labored breathing Extremities: Right forearm in splint, no lower extremity edema Abdomen: Soft, non-tender, +bowel sounds Neurology: Moves all extremities, alert Psychiatry:Normal mood and affect   Assessment/Plan:  Active Problems:   Fall  This is a 83 year old male with a PMH of advanced dementia, tachybradycardiac syndrome, atrial fibrillation with a pacemaker, HTN, HLD, CAD, ulcerative colitis, hypothyroidism, and COPD who presented with frequent falls and loose stools.   #Fall, multiple fractures: Patient presented after having multiple falls and ED visits. He came from the memory care facility where he was found on the ground after an unwitnessed fall. He has a history of of atrial fibrillation however there were no reports of dysrhythmias or palpitations. Orthostatics were negative. On CT he was found to have a right lateral 7th rib fracture, T12 and L1 endplate fractures, and non displaced fractures of the  radial head and neck. CT of head showed a soft tissue hematoma in the right supraorbital region. There does seem to be underlying osteoporosis given the multiple fragility fractures in the right seventh rib fracture and compression fractures. -Tylenol PRN for pain  -OxyIR q4 hr PRN for pain -PT/OT evaluated and recommended SNF -Vitamin D and calcium started -Per CSW, Daughter would like patient to go back to memory care unit with PT and [place] on board and discussing case with nurse at memory care unit  #Diarrhea: Patient had reported episodes of diarrhea prior to admission. Evaluation by outpatient GI in [place] found a negative c diff. GI panel PCR here was negative. Patient has not had any episodes while in the hospital so a C. Diff test here was unatainable. This was likely due to his ulcerative colitis. He has not had a bowel movement since admission.  -Continue imodium PRN -Senokot for constipation  #Atrial fibrillation, tachybradycardia syndrome -Patient has a pacemaker in place -Continue metoprolol 50 mg BID  #Hypothyroidism: -Continue home synthroid 50 mcg daily  #Hypertension: -Continue home metoprolol   FEN: No fluids, replete lytes prn, heart healthy diet VTE ppx: SCDs Code Status: DNR     Dispo: Anticipated discharge in approximately 1 day(s), awaiting CSW help regarding return to memory care unit  Asencion Noble, MD 12/10/2017, 6:30 AM Pager: [phone]

## 2017-12-10 NOTE — NC FL2 (Addendum)
[place] LEVEL OF CARE SCREENING TOOL     IDENTIFICATION  Patient Name: [name] Birthdate: Sep 14, 1934 Sex: male Admission Date (Current Location): 12/07/2017  [facility] and [place] Number:  Herbalist and Address:  [facility], [place] [address], [place], [place] [address]      Provider Number: [id number]  Attending Physician Name and Address:  Bartholomew Crews, MD  Relative Name and Phone Number:       Current Level of Care: Hospital Recommended Level of Care: Memory Care Prior Approval Number:    Date Approved/Denied:   PASRR Number:    Discharge Plan: Other (Comment)(Memory Care)    Current Diagnoses: Patient Active Problem List   Diagnosis Date Noted  . Closed fracture of one rib of right side   . Multiple contusions   . Fall 12/07/2017  . Adult failure to thrive 11/19/2016  . Hoarseness 02/06/2016  . COPD mixed type ([place]) 08/07/2014  . H/O asbestos exposure 08/07/2014  . Chest pain 07/06/2014  . Coronary artery disease involving native coronary artery of native heart with other form of angina pectoris ([place])   . Anxiety 01/19/2012  . Skin rash 06/08/2011  . Fatigue 12/02/2010  . Coronary atherosclerosis of native coronary artery 10/01/2010  . ARTHRITIS, RIGHT KNEE 09/30/2010  . Radicular leg pain 09/30/2010  . Abnormal cardiovascular function study 09/09/2010  . OTHER MONONEURITIS OF LOWER LIMB 07/16/2010  . UNSPECIFIED MONONEURITIS OF LOWER LIMB 06/05/2010  . ARTHRITIS, RIGHT KNEE 06/05/2010  . LEG PAIN 06/05/2010  . Hyperlipidemia 11/19/2008  . HTN (hypertension) 11/19/2008  . Atrial fibrillation ([place]) 11/19/2008  . Sick sinus syndrome ([place]) 11/19/2008  . ULCERATIVE COLITIS 11/19/2008  . DIZZINESS 11/19/2008  . Pacemaker 11/19/2008    Orientation RESPIRATION BLADDER Height & Weight     Self  Normal Incontinent Weight:   Height:     BEHAVIORAL SYMPTOMS/MOOD NEUROLOGICAL BOWEL NUTRITION STATUS       Continent Diet(Regular-No added salt)  AMBULATORY STATUS COMMUNICATION OF NEEDS Skin   Extensive Assist Verbally Other (Comment)(Wound on head)                       Personal Care Assistance Level of Assistance  Bathing, Feeding, Dressing Bathing Assistance: Maximum assistance Feeding assistance: Limited assistance Dressing Assistance: Maximum assistance     Functional Limitations Info  Sight, Hearing, Speech Sight Info: Adequate Hearing Info: Impaired Speech Info: Adequate    SPECIAL CARE FACTORS FREQUENCY  PT (By licensed PT), OT (By licensed OT)     PT Frequency: 3x/week OT Frequency: 3x/week            Contractures Contractures Info: Not present    Additional Factors Info  Code Status, Allergies Code Status Info: DNR Allergies Info: Nsaids           Current Medications (12/10/2017):    Discharge Medications:  TAKE these medications          acetaminophen 325 MG tablet Commonly known as:  TYLENOL Take 2 tablets (650 mg total) by mouth every 6 (six) hours as needed for mild pain (or Fever >/= 101).   ALPRAZolam 0.25 MG tablet Commonly known as:  XANAX Take 0.25 mg by mouth 3 (three) times daily as needed for anxiety (agitation (control)).   aspirin EC 81 MG tablet Take 81 mg by mouth daily.   bacitracin ointment Apply 1 application topically 2 (two) times daily on left arm skin tear.  calcium citrate 950 MG tablet Commonly known as:  CALCITRATE - dosed in mg elemental calcium Take 1 tablet (200 mg of elemental calcium total) by mouth daily. Start taking on:  12/11/2017   cetaphil cream Apply 1 application topically daily. Apply all over skin   Cholecalciferol 1000 units tablet Take 1 tablet (1,000 Units total) by mouth daily. Start taking on:  12/11/2017   cholestyramine 4 g packet Commonly known as:  QUESTRAN Take 2 g by mouth. M-W-F at bedtime   levothyroxine 50 MCG tablet Commonly known as:  SYNTHROID, LEVOTHROID Take 50  mcg by mouth daily before breakfast.   loperamide 2 MG tablet Commonly known as:  IMODIUM A-D Take 2 mg by mouth 4 (four) times daily as needed for diarrhea or loose stools.   Melatonin 3 MG Tabs Take 3 mg by mouth at bedtime.   metoprolol tartrate 50 MG tablet Commonly known as:  LOPRESSOR TAKE ONE TABLET BY MOUTH TWICE A DAY What changed:    how much to take  how to take this  when to take this   oxyCODONE 5 MG immediate release tablet Commonly known as:  Oxy IR/ROXICODONE Take 1 tablet (5 mg total) by mouth every 8 (eight) hours as needed for up to 3 days for moderate pain.   sertraline 50 MG tablet Commonly known as:  ZOLOFT Take 50 mg by mouth daily.      Relevant Imaging Results:  Relevant Lab Results:   Additional Information [phone]  Palliative to follow   Benard Halsted, LCSWA

## 2017-12-10 NOTE — Progress Notes (Signed)
Medicine attending: I examined this patient today together with resident physician Dr Lonia Skinner and I concur with her evaluation and management plan which we discussed together. He is much brighter and interactive today.  No acute change on exam.  Good relief of urethritis with a trial of Pyridium. Anticipate discharge back to his memory care unit when durable medical equipment/hospital bed has been delivered.  Daughter present.  Discharge plans reviewed. Elderly gentleman with advanced dementia, sick sinus syndrome status post pacemaker implantation, history of coronary disease, ulcerative colitis, hypothyroidism, and obstructive airway disease who presented after a fall.  He sustained multiple contusions on the face and scalp as well as a right lateral seventh rib fracture with additional T12 and L1 endplate fractures which were probably chronic.  An acute nondisplaced fracture of the left radial head.  On calcium and vitamin D.  We will add a bisphosphonate. Currently stable for discharge.

## 2017-12-10 NOTE — Care Management Important Message (Signed)
Important Message  Patient Details  Name: [name] MRN: [id number] Date of Birth: 1934-12-17   Medicare Important Message Given:  Yes    Erenest Rasher, RN 12/10/2017, 12:20 PM

## 2017-12-12 NOTE — Care Management (Signed)
Fax received from [facility] 12/11/17 that patient appealed d/c at 4:51pm on 12/10/17.  Pt left the facility around 1900 on 12/10/17, which was the same day as d/c order.  This information was faxed to [facility].  No other medical records were sent to [facility].

## 2017-12-13 DIAGNOSIS — R269 Unspecified abnormalities of gait and mobility: Secondary | ICD-10-CM | POA: Diagnosis not present

## 2017-12-13 DIAGNOSIS — Z4802 Encounter for removal of sutures: Secondary | ICD-10-CM | POA: Diagnosis not present

## 2017-12-13 DIAGNOSIS — Z79899 Other long term (current) drug therapy: Secondary | ICD-10-CM | POA: Diagnosis not present

## 2017-12-13 DIAGNOSIS — R197 Diarrhea, unspecified: Secondary | ICD-10-CM | POA: Diagnosis not present

## 2017-12-13 DIAGNOSIS — I4891 Unspecified atrial fibrillation: Secondary | ICD-10-CM | POA: Diagnosis not present

## 2017-12-13 DIAGNOSIS — R41841 Cognitive communication deficit: Secondary | ICD-10-CM | POA: Diagnosis not present

## 2017-12-14 DIAGNOSIS — Z7982 Long term (current) use of aspirin: Secondary | ICD-10-CM | POA: Diagnosis not present

## 2017-12-14 DIAGNOSIS — I4891 Unspecified atrial fibrillation: Secondary | ICD-10-CM | POA: Diagnosis not present

## 2017-12-14 DIAGNOSIS — F0391 Unspecified dementia with behavioral disturbance: Secondary | ICD-10-CM | POA: Diagnosis not present

## 2017-12-14 DIAGNOSIS — I10 Essential (primary) hypertension: Secondary | ICD-10-CM | POA: Diagnosis not present

## 2017-12-14 DIAGNOSIS — R260 Ataxic gait: Secondary | ICD-10-CM | POA: Diagnosis not present

## 2017-12-14 DIAGNOSIS — Z9183 Wandering in diseases classified elsewhere: Secondary | ICD-10-CM | POA: Diagnosis not present

## 2017-12-15 ENCOUNTER — Ambulatory Visit (INDEPENDENT_AMBULATORY_CARE_PROVIDER_SITE_OTHER): Payer: Medicare Other | Admitting: Physician Assistant

## 2017-12-15 DIAGNOSIS — F0391 Unspecified dementia with behavioral disturbance: Secondary | ICD-10-CM | POA: Diagnosis not present

## 2017-12-15 DIAGNOSIS — I4891 Unspecified atrial fibrillation: Secondary | ICD-10-CM | POA: Diagnosis not present

## 2017-12-15 DIAGNOSIS — R260 Ataxic gait: Secondary | ICD-10-CM | POA: Diagnosis not present

## 2017-12-15 DIAGNOSIS — Z7982 Long term (current) use of aspirin: Secondary | ICD-10-CM | POA: Diagnosis not present

## 2017-12-15 DIAGNOSIS — I10 Essential (primary) hypertension: Secondary | ICD-10-CM | POA: Diagnosis not present

## 2017-12-15 DIAGNOSIS — Z9183 Wandering in diseases classified elsewhere: Secondary | ICD-10-CM | POA: Diagnosis not present

## 2017-12-16 DIAGNOSIS — Z7982 Long term (current) use of aspirin: Secondary | ICD-10-CM | POA: Diagnosis not present

## 2017-12-16 DIAGNOSIS — Z9183 Wandering in diseases classified elsewhere: Secondary | ICD-10-CM | POA: Diagnosis not present

## 2017-12-16 DIAGNOSIS — I4891 Unspecified atrial fibrillation: Secondary | ICD-10-CM | POA: Diagnosis not present

## 2017-12-16 DIAGNOSIS — R260 Ataxic gait: Secondary | ICD-10-CM | POA: Diagnosis not present

## 2017-12-16 DIAGNOSIS — F0391 Unspecified dementia with behavioral disturbance: Secondary | ICD-10-CM | POA: Diagnosis not present

## 2017-12-16 DIAGNOSIS — I10 Essential (primary) hypertension: Secondary | ICD-10-CM | POA: Diagnosis not present

## 2017-12-17 DIAGNOSIS — I10 Essential (primary) hypertension: Secondary | ICD-10-CM | POA: Diagnosis not present

## 2017-12-17 DIAGNOSIS — R260 Ataxic gait: Secondary | ICD-10-CM | POA: Diagnosis not present

## 2017-12-17 DIAGNOSIS — F0391 Unspecified dementia with behavioral disturbance: Secondary | ICD-10-CM | POA: Diagnosis not present

## 2017-12-17 DIAGNOSIS — Z7982 Long term (current) use of aspirin: Secondary | ICD-10-CM | POA: Diagnosis not present

## 2017-12-17 DIAGNOSIS — I4891 Unspecified atrial fibrillation: Secondary | ICD-10-CM | POA: Diagnosis not present

## 2017-12-17 DIAGNOSIS — Z9183 Wandering in diseases classified elsewhere: Secondary | ICD-10-CM | POA: Diagnosis not present

## 2017-12-18 ENCOUNTER — Encounter (HOSPITAL_COMMUNITY): Payer: Self-pay | Admitting: Emergency Medicine

## 2017-12-18 ENCOUNTER — Emergency Department (HOSPITAL_COMMUNITY)
Admission: EM | Admit: 2017-12-18 | Discharge: 2017-12-18 | Disposition: A | Discharge status: Home / Self Care | Payer: Medicare Other | Attending: Emergency Medicine | Admitting: Emergency Medicine

## 2017-12-18 ENCOUNTER — Emergency Department (HOSPITAL_COMMUNITY): Payer: Medicare Other

## 2017-12-18 ENCOUNTER — Other Ambulatory Visit: Payer: Self-pay

## 2017-12-18 DIAGNOSIS — Z96652 Presence of left artificial knee joint: Secondary | ICD-10-CM | POA: Insufficient documentation

## 2017-12-18 DIAGNOSIS — Z79899 Other long term (current) drug therapy: Secondary | ICD-10-CM | POA: Diagnosis not present

## 2017-12-18 DIAGNOSIS — Z87891 Personal history of nicotine dependence: Secondary | ICD-10-CM | POA: Insufficient documentation

## 2017-12-18 DIAGNOSIS — Z7982 Long term (current) use of aspirin: Secondary | ICD-10-CM | POA: Insufficient documentation

## 2017-12-18 DIAGNOSIS — J449 Chronic obstructive pulmonary disease, unspecified: Secondary | ICD-10-CM | POA: Diagnosis not present

## 2017-12-18 DIAGNOSIS — I2510 Atherosclerotic heart disease of native coronary artery without angina pectoris: Secondary | ICD-10-CM | POA: Diagnosis not present

## 2017-12-18 DIAGNOSIS — I10 Essential (primary) hypertension: Secondary | ICD-10-CM | POA: Diagnosis not present

## 2017-12-18 DIAGNOSIS — M2550 Pain in unspecified joint: Secondary | ICD-10-CM | POA: Diagnosis not present

## 2017-12-18 DIAGNOSIS — E039 Hypothyroidism, unspecified: Secondary | ICD-10-CM | POA: Diagnosis not present

## 2017-12-18 DIAGNOSIS — R404 Transient alteration of awareness: Secondary | ICD-10-CM | POA: Diagnosis not present

## 2017-12-18 DIAGNOSIS — I4891 Unspecified atrial fibrillation: Secondary | ICD-10-CM | POA: Diagnosis not present

## 2017-12-18 DIAGNOSIS — R0902 Hypoxemia: Secondary | ICD-10-CM | POA: Diagnosis not present

## 2017-12-18 DIAGNOSIS — Z950 Presence of cardiac pacemaker: Secondary | ICD-10-CM | POA: Diagnosis not present

## 2017-12-18 DIAGNOSIS — Z7401 Bed confinement status: Secondary | ICD-10-CM | POA: Diagnosis not present

## 2017-12-18 DIAGNOSIS — R079 Chest pain, unspecified: Secondary | ICD-10-CM | POA: Diagnosis not present

## 2017-12-18 DIAGNOSIS — R0789 Other chest pain: Secondary | ICD-10-CM | POA: Diagnosis not present

## 2017-12-18 LAB — CBC
HEMATOCRIT: 41.4 % (ref 39.0–52.0)
HEMOGLOBIN: 12.9 g/dL — AB (ref 13.0–17.0)
MCH: 27.1 pg (ref 26.0–34.0)
MCHC: 31.2 g/dL (ref 30.0–36.0)
MCV: 87 fL (ref 78.0–100.0)
Platelets: 268 10*3/uL (ref 150–400)
RBC: 4.76 MIL/uL (ref 4.22–5.81)
RDW: 15.9 % — ABNORMAL HIGH (ref 11.5–15.5)
WBC: 14.9 10*3/uL — ABNORMAL HIGH (ref 4.0–10.5)

## 2017-12-18 LAB — I-STAT TROPONIN, ED: TROPONIN I, POC: 0.02 ng/mL (ref 0.00–0.08)

## 2017-12-18 LAB — BASIC METABOLIC PANEL
ANION GAP: 11 (ref 5–15)
BUN: 15 mg/dL (ref 8–23)
CALCIUM: 9.3 mg/dL (ref 8.9–10.3)
CHLORIDE: 96 mmol/L — AB (ref 98–111)
CO2: 27 mmol/L (ref 22–32)
Creatinine, Ser: 0.84 mg/dL (ref 0.61–1.24)
GFR calc non Af Amer: >60 mL/min (ref >60)
GLUCOSE: 91 mg/dL (ref 70–99)
POTASSIUM: 4.4 mmol/L (ref 3.5–5.1)
Sodium: 134 mmol/L — ABNORMAL LOW (ref 135–145)

## 2017-12-18 NOTE — ED Notes (Signed)
[facility] arrived to transport patient back to [facility]. Daughter and care giver at bedside

## 2017-12-18 NOTE — ED Notes (Signed)
[facility] contacted to transport patient to [facility]

## 2017-12-18 NOTE — ED Notes (Signed)
Spoke with [name] to advise pt has been discharged without new prescriptions and will returning to their facility via [facility]

## 2017-12-18 NOTE — ED Provider Notes (Signed)
[facility] EMERGENCY DEPARTMENT Provider Note   CSN: [id number] Arrival date & time: 12/18/17  1308     History   Chief Complaint Chief Complaint  Patient presents with  . Chest Pain    HPI [name] is a 83 y.o. male. Level 5 caveat due to dementia. HPI Patient sent in from nursing home.  Reportedly had had some complaint of chest pain.  Patient has dementia and has no complaints.  States it does not hurt to breathe.  No chest pain now.  Caregiver with the patient states she was not there this morning.  Had had some recent falls admission to the hospital and had had rib fractures. Past Medical History:  Diagnosis Date  . Anxiety   . Arthritis    "all over" (12/09/2017)  . CAD (coronary artery disease)    a.  cath 5/12: mLAD 50% then tandem 50%, dLAD 30%; D1 30%; OM1 occluded with collateral flow; pRCA 25%; EF 55% with inf HK;  occluded OM likely cause of perfusion defect on Myoview  . Chronic lower back pain   . Colitis   . COPD (chronic obstructive pulmonary disease) ([place])   . Dementia    short term  . Depression   . Dizziness   . Dyslipidemia   . GERD (gastroesophageal reflux disease)   . Hearing disorder   . History of gout   . Hyperlipidemia   . Hypertension   . Hypothyroidism   . Nonspecific abnormal unspecified cardiovascular function study   . Pacemaker 02/01/2006; 04/02/2015  . Pelvic fracture ([place])   . Persistent atrial fibrillation ([place])   . Pneumonia    "twice" (12/09/2017)  . Skin disorder    blister formation  . Tachy-brady syndrome ([place])    s/p pacer  . Ulcerative colitis     Patient Active Problem List   Diagnosis Date Noted  . Closed fracture of one rib of right side   . Multiple contusions   . Fall 12/07/2017  . Adult failure to thrive 11/19/2016  . Hoarseness 02/06/2016  . COPD mixed type ([employer]) 08/07/2014  . H/O asbestos exposure 08/07/2014  . Chest pain 07/06/2014  . Coronary artery disease involving native  coronary artery of native heart with other form of angina pectoris ([place])   . Anxiety 01/19/2012  . Skin rash 06/08/2011  . Fatigue 12/02/2010  . Coronary atherosclerosis of native coronary artery 10/01/2010  . ARTHRITIS, RIGHT KNEE 09/30/2010  . Radicular leg pain 09/30/2010  . Abnormal cardiovascular function study 09/09/2010  . OTHER MONONEURITIS OF LOWER LIMB 07/16/2010  . UNSPECIFIED MONONEURITIS OF LOWER LIMB 06/05/2010  . ARTHRITIS, RIGHT KNEE 06/05/2010  . LEG PAIN 06/05/2010  . Hyperlipidemia 11/19/2008  . HTN (hypertension) 11/19/2008  . Atrial fibrillation ([place]) 11/19/2008  . Sick sinus syndrome ([place]) 11/19/2008  . ULCERATIVE COLITIS 11/19/2008  . DIZZINESS 11/19/2008  . Pacemaker 11/19/2008    Past Surgical History:  Procedure Laterality Date  . BACK SURGERY    . CARDIAC CATHETERIZATION  09/20/10   EF 55%.....Dr. Percival Spanish  . CATARACT EXTRACTION W/ INTRAOCULAR LENS  IMPLANT, BILATERAL Bilateral   . CATARACT EXTRACTION W/PHACO Left 12/17/2014   Procedure: CATARACT EXTRACTION PHACO AND INTRAOCULAR LENS PLACEMENT (IOC);  Surgeon: Tonny Branch, MD;  Location: [facility];  Service: Ophthalmology;  Laterality: Left;  CDE 7.44  . EP IMPLANTABLE DEVICE N/A 04/02/2015   Procedure:  PPM Generator Changeout;  Surgeon: Thompson Grayer, MD;  Location: [facility];  Service: Cardiovascular;  Laterality: N/A;  . INSERT / REPLACE / REMOVE PACEMAKER  02/01/2006   MEDTRONIC....PPM Serial Number:  [id number].....Marland KitchenPPM Model Number:  [id number]....Marland KitchenPPM DOI:  02/01/2006    . JOINT REPLACEMENT    . KNEE ARTHROSCOPY Left 04/23/2006   w/debridement; [name] M.D  . LEFT HEART CATHETERIZATION WITH CORONARY ANGIOGRAM N/A 07/06/2014   Procedure: LEFT HEART CATHETERIZATION WITH CORONARY ANGIOGRAM;  Surgeon: Sinclair Grooms, MD;  Location: [facility] C[facility];  Service: Cardiovascular;  Laterality: N/A;  . [place]  . TONSILLECTOMY    . TOTAL KNEE ARTHROPLASTY Left 05/13/2007         Home Medications    Prior to Admission medications   Medication Sig Start Date End Date Taking? Authorizing Provider  acetaminophen (TYLENOL) 325 MG tablet Take 2 tablets (650 mg total) by mouth every 6 (six) hours as needed for mild pain (or Fever >/= 101). 12/10/17  Yes Asencion Noble, MD  aspirin EC 81 MG tablet Take 81 mg by mouth daily.   Yes [provider]  bacitracin ointment Apply 1 application topically 2 (two) times daily. Please apply to the left arm where skin tears are. 12/10/17  Yes Asencion Noble, MD  calcium citrate (CALCITRATE - DOSED IN MG ELEMENTAL CALCIUM) 950 MG tablet Take 1 tablet (200 mg of elemental calcium total) by mouth daily. 12/11/17  Yes Asencion Noble, MD  cetaphil (CETAPHIL) cream Apply 1 application topically daily. Apply all over skin   Yes [provider]  cholecalciferol 1000 units tablet Take 1 tablet (1,000 Units total) by mouth daily. 12/11/17  Yes Asencion Noble, MD  levothyroxine (SYNTHROID, LEVOTHROID) 50 MCG tablet Take 50 mcg by mouth daily before breakfast.  09/11/13  Yes [provider]  LORazepam (ATIVAN) 2 MG/ML concentrated solution Take 1 mg by mouth every 8 (eight) hours.   Yes [provider]  Melatonin 3 MG TABS Take 3 mg by mouth at bedtime.   Yes [provider]  metoprolol tartrate (LOPRESSOR) 50 MG tablet TAKE ONE TABLET BY MOUTH TWICE A DAY Patient taking differently: Take 50 mg by mouth 2 (two) times daily.  07/05/17  Yes Herminio Commons, MD  mupirocin ointment (BACTROBAN) 2 % Apply 1 application topically 2 (two) times daily.   Yes [provider]  ALPRAZolam (XANAX) 0.25 MG tablet Take 1 tablet (0.25 mg total) by mouth 3 (three) times daily as needed for anxiety (agitation (control)). 12/10/17   Asencion Noble, MD  sertraline (ZOLOFT) 50 MG tablet Take 50 mg by mouth daily.    [provider]    Family History Family History  Problem Relation Age  of Onset  . Lung disease Father        Emphysema  . Hypertension Father   . Peptic Ulcer Disease Father   . COPD Father   . Hypertension Mother   . Liver cancer Mother   . Pernicious anemia Mother   . Depression Mother   . Kidney disease Mother   . Colitis Mother   . Asthma Sister   . Healthy Daughter   . Healthy Daughter   . Healthy Son   . COPD Sister   . Glaucoma Sister   . Hypertension Sister   . Cancer Sister     Social History Social History   Tobacco Use  . Smoking status: Former Smoker    Packs/day: 1.00    Years: 15.00    Pack years: 15.00  Types: Cigarettes    Start date: 05/13/1955    Last attempt to quit: 05/12/1983    Years since quitting: 34.6  . Smokeless tobacco: Never Used  Substance Use Topics  . Alcohol use: No    Alcohol/week: 0.0 standard drinks  . Drug use: No     Allergies   Nsaids and Tolmetin   Review of Systems Review of Systems  Unable to perform ROS: Dementia     Physical Exam Updated Vital Signs BP (!) 144/84   Pulse 70   Temp 98.1 F (36.7 C) (Oral)   Resp 18   SpO2 97%   Physical Exam  Constitutional: He appears well-developed.  HENT:  Head: Atraumatic.  Eyes: EOM are normal.  Neck: Neck supple.  Cardiovascular: Normal rate.  Pulmonary/Chest:  no chest tenderness  Abdominal: There is no tenderness.  Musculoskeletal:       Right lower leg: He exhibits no tenderness.       Left lower leg: He exhibits no tenderness.  Neurological: He is alert.  Patient is awake and pleasant and had his reported baseline dementia.  Skin: Skin is warm. Capillary refill takes less than 2 seconds.  Psychiatric: He has a normal mood and affect.     ED Treatments / Results  Labs (all labs ordered are listed, but only abnormal results are displayed) Labs Reviewed  BASIC METABOLIC PANEL - Abnormal; Notable for the following components:      Result Value   Sodium 134 (*)    Chloride 96 (*)    All other components within normal  limits  CBC - Abnormal; Notable for the following components:   WBC 14.9 (*)    Hemoglobin 12.9 (*)    RDW 15.9 (*)    All other components within normal limits  I-STAT TROPONIN, ED    EKG EKG Interpretation  Date/Time:  Saturday December 18 2017 07:40:02 EDT Ventricular Rate:  85 PR Interval:    QRS Duration: 158 QT Interval:  404 QTC Calculation: 481 R Axis:   103 Text Interpretation:  Atrial fibrillation Right bundle branch block Confirmed by Davonna Belling [phone]) on 12/18/2017 8:14:37 AM   Radiology Dg Chest 2 View  Result Date: 12/18/2017 CLINICAL DATA:  Chest pain EXAM: CHEST - 2 VIEW COMPARISON:  12/07/2017 FINDINGS: There is no focal parenchymal opacity. There is no pleural effusion or pneumothorax. The heart and mediastinal contours are unremarkable. There is a dual lead cardiac pacemaker. The osseous structures are unremarkable. IMPRESSION: No active cardiopulmonary disease. Electronically Signed   By: Kathreen Devoid   On: 12/18/2017 08:44    Procedures Procedures (including critical care time)  Medications Ordered in ED Medications - No data to display   Initial Impression / Assessment and Plan / ED Course  I have reviewed the triage vital signs and the nursing notes.  Pertinent labs & imaging results that were available during my care of the patient were reviewed by me and considered in my medical decision making (see chart for details).     Patient with reported chest pain.  Patient has dementia and has no complaints now and he cannot tell if he was hurting before.  EKG reassuring.  Not hypoxic or tachycardic.  No tenderness.  X-ray reassuring EKG reassuring and lab work reassuring.  Discharge home.  Final Clinical Impressions(s) / ED Diagnoses   Final diagnoses:  Nonspecific chest pain    ED Discharge Orders    None       Davonna Belling,  MD 12/18/17 1624

## 2017-12-18 NOTE — ED Triage Notes (Addendum)
Patient arrived from [place] at [facility]. Staff reports that patient was complaining of chest pain, [facility] staff also reports that patient has had multiple falls with "left arm and left rib fractures" Patient is A&O X1, He denies pain at this time Patient has a pacemaker Staff from Home instead at bedside(she states that his daughter hired home instead to be at patient's side to prevent falls)

## 2017-12-20 DIAGNOSIS — Z7982 Long term (current) use of aspirin: Secondary | ICD-10-CM | POA: Diagnosis not present

## 2017-12-20 DIAGNOSIS — Z79899 Other long term (current) drug therapy: Secondary | ICD-10-CM | POA: Diagnosis not present

## 2017-12-20 DIAGNOSIS — I25810 Atherosclerosis of coronary artery bypass graft(s) without angina pectoris: Secondary | ICD-10-CM | POA: Diagnosis not present

## 2017-12-20 DIAGNOSIS — I10 Essential (primary) hypertension: Secondary | ICD-10-CM | POA: Diagnosis not present

## 2017-12-20 DIAGNOSIS — I4891 Unspecified atrial fibrillation: Secondary | ICD-10-CM | POA: Diagnosis not present

## 2017-12-20 DIAGNOSIS — J449 Chronic obstructive pulmonary disease, unspecified: Secondary | ICD-10-CM | POA: Diagnosis not present

## 2017-12-20 DIAGNOSIS — F0391 Unspecified dementia with behavioral disturbance: Secondary | ICD-10-CM | POA: Diagnosis not present

## 2017-12-20 DIAGNOSIS — F0390 Unspecified dementia without behavioral disturbance: Secondary | ICD-10-CM | POA: Diagnosis not present

## 2017-12-20 DIAGNOSIS — Z9183 Wandering in diseases classified elsewhere: Secondary | ICD-10-CM | POA: Diagnosis not present

## 2017-12-20 DIAGNOSIS — R260 Ataxic gait: Secondary | ICD-10-CM | POA: Diagnosis not present

## 2017-12-21 DIAGNOSIS — Z9183 Wandering in diseases classified elsewhere: Secondary | ICD-10-CM | POA: Diagnosis not present

## 2017-12-21 DIAGNOSIS — Z7982 Long term (current) use of aspirin: Secondary | ICD-10-CM | POA: Diagnosis not present

## 2017-12-21 DIAGNOSIS — F0391 Unspecified dementia with behavioral disturbance: Secondary | ICD-10-CM | POA: Diagnosis not present

## 2017-12-21 DIAGNOSIS — I10 Essential (primary) hypertension: Secondary | ICD-10-CM | POA: Diagnosis not present

## 2017-12-21 DIAGNOSIS — R260 Ataxic gait: Secondary | ICD-10-CM | POA: Diagnosis not present

## 2017-12-21 DIAGNOSIS — I4891 Unspecified atrial fibrillation: Secondary | ICD-10-CM | POA: Diagnosis not present

## 2017-12-23 DIAGNOSIS — F0391 Unspecified dementia with behavioral disturbance: Secondary | ICD-10-CM | POA: Diagnosis not present

## 2017-12-23 DIAGNOSIS — R260 Ataxic gait: Secondary | ICD-10-CM | POA: Diagnosis not present

## 2017-12-23 DIAGNOSIS — Z9183 Wandering in diseases classified elsewhere: Secondary | ICD-10-CM | POA: Diagnosis not present

## 2017-12-23 DIAGNOSIS — I4891 Unspecified atrial fibrillation: Secondary | ICD-10-CM | POA: Diagnosis not present

## 2017-12-23 DIAGNOSIS — S51812D Laceration without foreign body of left forearm, subsequent encounter: Secondary | ICD-10-CM | POA: Diagnosis not present

## 2017-12-23 DIAGNOSIS — I10 Essential (primary) hypertension: Secondary | ICD-10-CM | POA: Diagnosis not present

## 2017-12-24 ENCOUNTER — Ambulatory Visit (INDEPENDENT_AMBULATORY_CARE_PROVIDER_SITE_OTHER): Payer: Medicare Other

## 2017-12-24 ENCOUNTER — Ambulatory Visit (INDEPENDENT_AMBULATORY_CARE_PROVIDER_SITE_OTHER): Payer: Medicare Other | Admitting: Orthopaedic Surgery

## 2017-12-24 ENCOUNTER — Encounter (INDEPENDENT_AMBULATORY_CARE_PROVIDER_SITE_OTHER): Payer: Self-pay | Admitting: Orthopaedic Surgery

## 2017-12-24 DIAGNOSIS — S3992XA Unspecified injury of lower back, initial encounter: Secondary | ICD-10-CM

## 2017-12-24 DIAGNOSIS — M25522 Pain in left elbow: Secondary | ICD-10-CM

## 2017-12-24 NOTE — Progress Notes (Signed)
Office Visit Note   Patient: [name]           Date of Birth: [birth year]           MRN: [id number] Visit Date: 12/24/2017              Requested by: Orvis Brill, [facility] [address] [place] [address], [place] [address] PCP: [facility]   Assessment & Plan: Visit Diagnoses:  1. Pain in left elbow   2. Injury of low back, initial encounter     Plan: Impression is resolved left elbow injury #2 right rib fracture resolved #3 lumbar fracture T12-L1 asymptomatic.  At this point, we will have the patient continue working with physical therapy at his rehab facility.  He will follow-up with [place] as needed.  Call with concerns or questions in the meantime.  Follow-Up Instructions: Return if symptoms worsen or fail to improve.   Orders:  Orders Placed This Encounter  Procedures  . XR Elbow 2 Views Left  . XR Lumbar Spine 2-3 Views   No orders of the defined types were placed in this encounter.     Procedures: No procedures performed   Clinical Data: No additional findings.   Subjective: Chief Complaint  Patient presents with  . Chest - Fracture    HPI patient is a pleasant 83 year old gentleman who comes in today following multiple falls.  He has a history of dementia and currently lives in a skilled nursing facility.  He is here with his daughter as well as a caretaker.  His first fall occurred early July of this past year.  He injured his elbow where they initially thought he had a fracture.  He was placed in a splint but was noncompliant.  He had multiple subsequent falls where it was found that he had an endplate fracture to B34-L9 was questionable for acute versus subacute.  He also had an incidental fracture to the fourth rib found on a CT of his abdomen.  He has been asymptomatic from all of the above complaints.  He is left-handed and has been fully using the left upper extremity without issues.  His only problem is been his decreased  mentation as well as mobility.  He was previously ambulating without the use of a walker or cane and is now using a wheelchair and occasionally a walker.  He denies any chest pain or shortness of breath.  No pain to the left elbow or the back.  Review of Systems as detailed in HPI.  All others reviewed and are negative.   Objective: Vital Signs: There were no vitals taken for this visit.  Physical Exam well-developed well-nourished gentleman no acute distress.  Ortho Exam examination of the left elbow reveals full range of motion.  No bony tenderness.  No swelling.  Examination of the lumbar spine and chest is unremarkable.  Specialty Comments:  No specialty comments available.  Imaging: Xr Elbow 2 Views Left  Result Date: 12/24/2017  no acute or structural abnormalities  Xr Lumbar Spine 2-3 Views  Result Date: 12/24/2017 Questionable subacute fracture endplate T12-L1    PMFS History: Patient Active Problem List   Diagnosis Date Noted  . Pain in left elbow 12/24/2017  . Lower back injury 12/24/2017  . Closed fracture of one rib of right side   . Multiple contusions   . Fall 12/07/2017  . Adult failure to thrive 11/19/2016  . Hoarseness 02/06/2016  . COPD mixed type ([place]) 08/07/2014  .  H/O asbestos exposure 08/07/2014  . Chest pain 07/06/2014  . Coronary artery disease involving native coronary artery of native heart with other form of angina pectoris ([place])   . Anxiety 01/19/2012  . Skin rash 06/08/2011  . Fatigue 12/02/2010  . Coronary atherosclerosis of native coronary artery 10/01/2010  . ARTHRITIS, RIGHT KNEE 09/30/2010  . Radicular leg pain 09/30/2010  . Abnormal cardiovascular function study 09/09/2010  . OTHER MONONEURITIS OF LOWER LIMB 07/16/2010  . UNSPECIFIED MONONEURITIS OF LOWER LIMB 06/05/2010  . ARTHRITIS, RIGHT KNEE 06/05/2010  . LEG PAIN 06/05/2010  . Hyperlipidemia 11/19/2008  . HTN (hypertension) 11/19/2008  . Atrial fibrillation ([place]) 11/19/2008    . Sick sinus syndrome ([place]) 11/19/2008  . ULCERATIVE COLITIS 11/19/2008  . DIZZINESS 11/19/2008  . Pacemaker 11/19/2008   Past Medical History:  Diagnosis Date  . Anxiety   . Arthritis    "all over" (12/09/2017)  . CAD (coronary artery disease)    a.  cath 5/12: mLAD 50% then tandem 50%, dLAD 30%; D1 30%; OM1 occluded with collateral flow; pRCA 25%; EF 55% with inf HK;  occluded OM likely cause of perfusion defect on Myoview  . Chronic lower back pain   . Colitis   . COPD (chronic obstructive pulmonary disease) ([place])   . Dementia    short term  . Depression   . Dizziness   . Dyslipidemia   . GERD (gastroesophageal reflux disease)   . Hearing disorder   . History of gout   . Hyperlipidemia   . Hypertension   . Hypothyroidism   . Nonspecific abnormal unspecified cardiovascular function study   . Pacemaker 02/01/2006; 04/02/2015  . Pelvic fracture ([place])   . Persistent atrial fibrillation ([place])   . Pneumonia    "twice" (12/09/2017)  . Skin disorder    blister formation  . Tachy-brady syndrome ([place])    s/p pacer  . Ulcerative colitis     Family History  Problem Relation Age of Onset  . Lung disease Father        Emphysema  . Hypertension Father   . Peptic Ulcer Disease Father   . COPD Father   . Hypertension Mother   . Liver cancer Mother   . Pernicious anemia Mother   . Depression Mother   . Kidney disease Mother   . Colitis Mother   . Asthma Sister   . Healthy Daughter   . Healthy Daughter   . Healthy Son   . COPD Sister   . Glaucoma Sister   . Hypertension Sister   . Cancer Sister     Past Surgical History:  Procedure Laterality Date  . BACK SURGERY    . CARDIAC CATHETERIZATION  09/20/10   EF 55%.....Dr. Percival Spanish  . CATARACT EXTRACTION W/ INTRAOCULAR LENS  IMPLANT, BILATERAL Bilateral   . CATARACT EXTRACTION W/PHACO Left 12/17/2014   Procedure: CATARACT EXTRACTION PHACO AND INTRAOCULAR LENS PLACEMENT (IOC);  Surgeon: Tonny Branch, MD;  Location: [facility];   Service: Ophthalmology;  Laterality: Left;  CDE 7.44  . EP IMPLANTABLE DEVICE N/A 04/02/2015   Procedure:  PPM Generator Changeout;  Surgeon: Thompson Grayer, MD;  Location: [facility];  Service: Cardiovascular;  Laterality: N/A;  . INSERT / REPLACE / REMOVE PACEMAKER  02/01/2006   MEDTRONIC....PPM Serial Number:  [id number].....Marland KitchenPPM Model Number:  [id number]....Marland KitchenPPM DOI:  02/01/2006    . JOINT REPLACEMENT    . KNEE ARTHROSCOPY Left 04/23/2006   w/debridement; [name] M.D  . [place]  CORONARY ANGIOGRAM N/A 07/06/2014   Procedure: LEFT HEART CATHETERIZATION WITH CORONARY ANGIOGRAM;  Surgeon: Sinclair Grooms, MD;  Location: [facility] CATH LAB;  Service: Cardiovascular;  Laterality: N/A;  . [place]  . TONSILLECTOMY    . TOTAL KNEE ARTHROPLASTY Left 05/13/2007   Social History   Occupational History  . Occupation: retired    Fish farm manager: RETIRED  Tobacco Use  . Smoking status: Former Smoker    Packs/day: 1.00    Years: 15.00    Pack years: 15.00    Types: Cigarettes    Start date: 05/13/1955    Last attempt to quit: 05/12/1983    Years since quitting: 34.6  . Smokeless tobacco: Never Used  Substance and Sexual Activity  . Alcohol use: No    Alcohol/week: 0.0 standard drinks  . Drug use: No  . Sexual activity: Not on file

## 2017-12-27 DIAGNOSIS — I10 Essential (primary) hypertension: Secondary | ICD-10-CM | POA: Diagnosis not present

## 2017-12-27 DIAGNOSIS — Z9183 Wandering in diseases classified elsewhere: Secondary | ICD-10-CM | POA: Diagnosis not present

## 2017-12-27 DIAGNOSIS — F0391 Unspecified dementia with behavioral disturbance: Secondary | ICD-10-CM | POA: Diagnosis not present

## 2017-12-27 DIAGNOSIS — R4189 Other symptoms and signs involving cognitive functions and awareness: Secondary | ICD-10-CM | POA: Diagnosis not present

## 2017-12-27 DIAGNOSIS — R260 Ataxic gait: Secondary | ICD-10-CM | POA: Diagnosis not present

## 2017-12-27 DIAGNOSIS — R1319 Other dysphagia: Secondary | ICD-10-CM | POA: Diagnosis not present

## 2017-12-27 DIAGNOSIS — S51812D Laceration without foreign body of left forearm, subsequent encounter: Secondary | ICD-10-CM | POA: Diagnosis not present

## 2017-12-27 DIAGNOSIS — R197 Diarrhea, unspecified: Secondary | ICD-10-CM | POA: Diagnosis not present

## 2017-12-27 DIAGNOSIS — I4891 Unspecified atrial fibrillation: Secondary | ICD-10-CM | POA: Diagnosis not present

## 2017-12-27 DIAGNOSIS — Z79899 Other long term (current) drug therapy: Secondary | ICD-10-CM | POA: Diagnosis not present

## 2017-12-27 DIAGNOSIS — R269 Unspecified abnormalities of gait and mobility: Secondary | ICD-10-CM | POA: Diagnosis not present

## 2017-12-28 DIAGNOSIS — Z9183 Wandering in diseases classified elsewhere: Secondary | ICD-10-CM | POA: Diagnosis not present

## 2017-12-28 DIAGNOSIS — S51812D Laceration without foreign body of left forearm, subsequent encounter: Secondary | ICD-10-CM | POA: Diagnosis not present

## 2017-12-28 DIAGNOSIS — I10 Essential (primary) hypertension: Secondary | ICD-10-CM | POA: Diagnosis not present

## 2017-12-28 DIAGNOSIS — I4891 Unspecified atrial fibrillation: Secondary | ICD-10-CM | POA: Diagnosis not present

## 2017-12-28 DIAGNOSIS — R260 Ataxic gait: Secondary | ICD-10-CM | POA: Diagnosis not present

## 2017-12-28 DIAGNOSIS — F0391 Unspecified dementia with behavioral disturbance: Secondary | ICD-10-CM | POA: Diagnosis not present

## 2017-12-29 DIAGNOSIS — S51812D Laceration without foreign body of left forearm, subsequent encounter: Secondary | ICD-10-CM | POA: Diagnosis not present

## 2017-12-29 DIAGNOSIS — Z9183 Wandering in diseases classified elsewhere: Secondary | ICD-10-CM | POA: Diagnosis not present

## 2017-12-29 DIAGNOSIS — R260 Ataxic gait: Secondary | ICD-10-CM | POA: Diagnosis not present

## 2017-12-29 DIAGNOSIS — I4891 Unspecified atrial fibrillation: Secondary | ICD-10-CM | POA: Diagnosis not present

## 2017-12-29 DIAGNOSIS — F0391 Unspecified dementia with behavioral disturbance: Secondary | ICD-10-CM | POA: Diagnosis not present

## 2017-12-29 DIAGNOSIS — I10 Essential (primary) hypertension: Secondary | ICD-10-CM | POA: Diagnosis not present

## 2017-12-31 DIAGNOSIS — S51812D Laceration without foreign body of left forearm, subsequent encounter: Secondary | ICD-10-CM | POA: Diagnosis not present

## 2017-12-31 DIAGNOSIS — F0391 Unspecified dementia with behavioral disturbance: Secondary | ICD-10-CM | POA: Diagnosis not present

## 2017-12-31 DIAGNOSIS — Z9183 Wandering in diseases classified elsewhere: Secondary | ICD-10-CM | POA: Diagnosis not present

## 2017-12-31 DIAGNOSIS — I4891 Unspecified atrial fibrillation: Secondary | ICD-10-CM | POA: Diagnosis not present

## 2017-12-31 DIAGNOSIS — R260 Ataxic gait: Secondary | ICD-10-CM | POA: Diagnosis not present

## 2017-12-31 DIAGNOSIS — I10 Essential (primary) hypertension: Secondary | ICD-10-CM | POA: Diagnosis not present

## 2018-01-03 DIAGNOSIS — R260 Ataxic gait: Secondary | ICD-10-CM | POA: Diagnosis not present

## 2018-01-03 DIAGNOSIS — I10 Essential (primary) hypertension: Secondary | ICD-10-CM | POA: Diagnosis not present

## 2018-01-03 DIAGNOSIS — E039 Hypothyroidism, unspecified: Secondary | ICD-10-CM | POA: Diagnosis not present

## 2018-01-03 DIAGNOSIS — I4891 Unspecified atrial fibrillation: Secondary | ICD-10-CM | POA: Diagnosis not present

## 2018-01-03 DIAGNOSIS — R1319 Other dysphagia: Secondary | ICD-10-CM | POA: Diagnosis not present

## 2018-01-03 DIAGNOSIS — S51812D Laceration without foreign body of left forearm, subsequent encounter: Secondary | ICD-10-CM | POA: Diagnosis not present

## 2018-01-03 DIAGNOSIS — R197 Diarrhea, unspecified: Secondary | ICD-10-CM | POA: Diagnosis not present

## 2018-01-03 DIAGNOSIS — Z9183 Wandering in diseases classified elsewhere: Secondary | ICD-10-CM | POA: Diagnosis not present

## 2018-01-03 DIAGNOSIS — F0391 Unspecified dementia with behavioral disturbance: Secondary | ICD-10-CM | POA: Diagnosis not present

## 2018-01-04 DIAGNOSIS — R260 Ataxic gait: Secondary | ICD-10-CM | POA: Diagnosis not present

## 2018-01-04 DIAGNOSIS — Z9183 Wandering in diseases classified elsewhere: Secondary | ICD-10-CM | POA: Diagnosis not present

## 2018-01-04 DIAGNOSIS — F0391 Unspecified dementia with behavioral disturbance: Secondary | ICD-10-CM | POA: Diagnosis not present

## 2018-01-04 DIAGNOSIS — S51812D Laceration without foreign body of left forearm, subsequent encounter: Secondary | ICD-10-CM | POA: Diagnosis not present

## 2018-01-04 DIAGNOSIS — I10 Essential (primary) hypertension: Secondary | ICD-10-CM | POA: Diagnosis not present

## 2018-01-04 DIAGNOSIS — I4891 Unspecified atrial fibrillation: Secondary | ICD-10-CM | POA: Diagnosis not present

## 2018-01-05 DIAGNOSIS — Z9183 Wandering in diseases classified elsewhere: Secondary | ICD-10-CM | POA: Diagnosis not present

## 2018-01-05 DIAGNOSIS — I4891 Unspecified atrial fibrillation: Secondary | ICD-10-CM | POA: Diagnosis not present

## 2018-01-05 DIAGNOSIS — S51812D Laceration without foreign body of left forearm, subsequent encounter: Secondary | ICD-10-CM | POA: Diagnosis not present

## 2018-01-05 DIAGNOSIS — R260 Ataxic gait: Secondary | ICD-10-CM | POA: Diagnosis not present

## 2018-01-05 DIAGNOSIS — F0391 Unspecified dementia with behavioral disturbance: Secondary | ICD-10-CM | POA: Diagnosis not present

## 2018-01-05 DIAGNOSIS — I10 Essential (primary) hypertension: Secondary | ICD-10-CM | POA: Diagnosis not present

## 2018-01-06 DIAGNOSIS — F0150 Vascular dementia without behavioral disturbance: Secondary | ICD-10-CM | POA: Diagnosis not present

## 2018-01-07 DIAGNOSIS — Z9183 Wandering in diseases classified elsewhere: Secondary | ICD-10-CM | POA: Diagnosis not present

## 2018-01-07 DIAGNOSIS — F0391 Unspecified dementia with behavioral disturbance: Secondary | ICD-10-CM | POA: Diagnosis not present

## 2018-01-07 DIAGNOSIS — I10 Essential (primary) hypertension: Secondary | ICD-10-CM | POA: Diagnosis not present

## 2018-01-07 DIAGNOSIS — S51812D Laceration without foreign body of left forearm, subsequent encounter: Secondary | ICD-10-CM | POA: Diagnosis not present

## 2018-01-07 DIAGNOSIS — R260 Ataxic gait: Secondary | ICD-10-CM | POA: Diagnosis not present

## 2018-01-07 DIAGNOSIS — I4891 Unspecified atrial fibrillation: Secondary | ICD-10-CM | POA: Diagnosis not present

## 2018-01-11 DIAGNOSIS — F0391 Unspecified dementia with behavioral disturbance: Secondary | ICD-10-CM | POA: Diagnosis not present

## 2018-01-11 DIAGNOSIS — I4891 Unspecified atrial fibrillation: Secondary | ICD-10-CM | POA: Diagnosis not present

## 2018-01-11 DIAGNOSIS — I10 Essential (primary) hypertension: Secondary | ICD-10-CM | POA: Diagnosis not present

## 2018-01-11 DIAGNOSIS — R260 Ataxic gait: Secondary | ICD-10-CM | POA: Diagnosis not present

## 2018-01-11 DIAGNOSIS — Z9183 Wandering in diseases classified elsewhere: Secondary | ICD-10-CM | POA: Diagnosis not present

## 2018-01-11 DIAGNOSIS — S51812D Laceration without foreign body of left forearm, subsequent encounter: Secondary | ICD-10-CM | POA: Diagnosis not present

## 2018-01-13 DIAGNOSIS — F0391 Unspecified dementia with behavioral disturbance: Secondary | ICD-10-CM | POA: Diagnosis not present

## 2018-01-13 DIAGNOSIS — I10 Essential (primary) hypertension: Secondary | ICD-10-CM | POA: Diagnosis not present

## 2018-01-13 DIAGNOSIS — S51812D Laceration without foreign body of left forearm, subsequent encounter: Secondary | ICD-10-CM | POA: Diagnosis not present

## 2018-01-13 DIAGNOSIS — R260 Ataxic gait: Secondary | ICD-10-CM | POA: Diagnosis not present

## 2018-01-13 DIAGNOSIS — Z9183 Wandering in diseases classified elsewhere: Secondary | ICD-10-CM | POA: Diagnosis not present

## 2018-01-13 DIAGNOSIS — I4891 Unspecified atrial fibrillation: Secondary | ICD-10-CM | POA: Diagnosis not present

## 2018-01-14 DIAGNOSIS — R260 Ataxic gait: Secondary | ICD-10-CM | POA: Diagnosis not present

## 2018-01-14 DIAGNOSIS — S51812D Laceration without foreign body of left forearm, subsequent encounter: Secondary | ICD-10-CM | POA: Diagnosis not present

## 2018-01-14 DIAGNOSIS — I4891 Unspecified atrial fibrillation: Secondary | ICD-10-CM | POA: Diagnosis not present

## 2018-01-14 DIAGNOSIS — I10 Essential (primary) hypertension: Secondary | ICD-10-CM | POA: Diagnosis not present

## 2018-01-14 DIAGNOSIS — F0391 Unspecified dementia with behavioral disturbance: Secondary | ICD-10-CM | POA: Diagnosis not present

## 2018-01-14 DIAGNOSIS — Z9183 Wandering in diseases classified elsewhere: Secondary | ICD-10-CM | POA: Diagnosis not present

## 2018-01-17 ENCOUNTER — Encounter: Payer: Self-pay | Admitting: Cardiology

## 2018-01-17 ENCOUNTER — Ambulatory Visit (INDEPENDENT_AMBULATORY_CARE_PROVIDER_SITE_OTHER): Payer: Medicare Other | Admitting: *Deleted

## 2018-01-17 DIAGNOSIS — S51812D Laceration without foreign body of left forearm, subsequent encounter: Secondary | ICD-10-CM | POA: Diagnosis not present

## 2018-01-17 DIAGNOSIS — Z9183 Wandering in diseases classified elsewhere: Secondary | ICD-10-CM | POA: Diagnosis not present

## 2018-01-17 DIAGNOSIS — I495 Sick sinus syndrome: Secondary | ICD-10-CM | POA: Diagnosis not present

## 2018-01-17 DIAGNOSIS — F0391 Unspecified dementia with behavioral disturbance: Secondary | ICD-10-CM | POA: Diagnosis not present

## 2018-01-17 DIAGNOSIS — R260 Ataxic gait: Secondary | ICD-10-CM | POA: Diagnosis not present

## 2018-01-17 DIAGNOSIS — I4891 Unspecified atrial fibrillation: Secondary | ICD-10-CM | POA: Diagnosis not present

## 2018-01-17 DIAGNOSIS — I10 Essential (primary) hypertension: Secondary | ICD-10-CM | POA: Diagnosis not present

## 2018-01-17 NOTE — Progress Notes (Signed)
Remote pacemaker transmission.   

## 2018-01-19 DIAGNOSIS — R260 Ataxic gait: Secondary | ICD-10-CM | POA: Diagnosis not present

## 2018-01-19 DIAGNOSIS — I10 Essential (primary) hypertension: Secondary | ICD-10-CM | POA: Diagnosis not present

## 2018-01-19 DIAGNOSIS — S51812D Laceration without foreign body of left forearm, subsequent encounter: Secondary | ICD-10-CM | POA: Diagnosis not present

## 2018-01-19 DIAGNOSIS — F0391 Unspecified dementia with behavioral disturbance: Secondary | ICD-10-CM | POA: Diagnosis not present

## 2018-01-19 DIAGNOSIS — I4891 Unspecified atrial fibrillation: Secondary | ICD-10-CM | POA: Diagnosis not present

## 2018-01-19 DIAGNOSIS — Z9183 Wandering in diseases classified elsewhere: Secondary | ICD-10-CM | POA: Diagnosis not present

## 2018-01-20 DIAGNOSIS — Z9183 Wandering in diseases classified elsewhere: Secondary | ICD-10-CM | POA: Diagnosis not present

## 2018-01-20 DIAGNOSIS — F0391 Unspecified dementia with behavioral disturbance: Secondary | ICD-10-CM | POA: Diagnosis not present

## 2018-01-20 DIAGNOSIS — R260 Ataxic gait: Secondary | ICD-10-CM | POA: Diagnosis not present

## 2018-01-20 DIAGNOSIS — I4891 Unspecified atrial fibrillation: Secondary | ICD-10-CM | POA: Diagnosis not present

## 2018-01-20 DIAGNOSIS — S51812D Laceration without foreign body of left forearm, subsequent encounter: Secondary | ICD-10-CM | POA: Diagnosis not present

## 2018-01-20 DIAGNOSIS — I10 Essential (primary) hypertension: Secondary | ICD-10-CM | POA: Diagnosis not present

## 2018-01-24 DIAGNOSIS — F0150 Vascular dementia without behavioral disturbance: Secondary | ICD-10-CM | POA: Diagnosis not present

## 2018-01-24 DIAGNOSIS — R2681 Unsteadiness on feet: Secondary | ICD-10-CM | POA: Diagnosis not present

## 2018-01-24 DIAGNOSIS — M25561 Pain in right knee: Secondary | ICD-10-CM | POA: Diagnosis not present

## 2018-01-24 DIAGNOSIS — R634 Abnormal weight loss: Secondary | ICD-10-CM | POA: Diagnosis not present

## 2018-01-28 ENCOUNTER — Telehealth: Payer: Self-pay | Admitting: Licensed Clinical Social Worker

## 2018-01-28 NOTE — Telephone Encounter (Signed)
Palliative Care SW left a vm to set up a home visit.

## 2018-01-29 ENCOUNTER — Other Ambulatory Visit: Payer: Self-pay

## 2018-01-29 ENCOUNTER — Emergency Department (HOSPITAL_COMMUNITY)
Admission: EM | Admit: 2018-01-29 | Discharge: 2018-01-29 | Disposition: A | Discharge status: Home / Self Care | Payer: Medicare Other | Attending: Emergency Medicine | Admitting: Emergency Medicine

## 2018-01-29 ENCOUNTER — Emergency Department (HOSPITAL_COMMUNITY): Payer: Medicare Other

## 2018-01-29 DIAGNOSIS — F0390 Unspecified dementia without behavioral disturbance: Secondary | ICD-10-CM | POA: Insufficient documentation

## 2018-01-29 DIAGNOSIS — Y92191 Dining room in other specified residential institution as the place of occurrence of the external cause: Secondary | ICD-10-CM | POA: Diagnosis not present

## 2018-01-29 DIAGNOSIS — Z79899 Other long term (current) drug therapy: Secondary | ICD-10-CM | POA: Diagnosis not present

## 2018-01-29 DIAGNOSIS — R279 Unspecified lack of coordination: Secondary | ICD-10-CM | POA: Diagnosis not present

## 2018-01-29 DIAGNOSIS — W01198A Fall on same level from slipping, tripping and stumbling with subsequent striking against other object, initial encounter: Secondary | ICD-10-CM | POA: Insufficient documentation

## 2018-01-29 DIAGNOSIS — Y999 Unspecified external cause status: Secondary | ICD-10-CM | POA: Diagnosis not present

## 2018-01-29 DIAGNOSIS — I10 Essential (primary) hypertension: Secondary | ICD-10-CM | POA: Insufficient documentation

## 2018-01-29 DIAGNOSIS — Y9389 Activity, other specified: Secondary | ICD-10-CM | POA: Insufficient documentation

## 2018-01-29 DIAGNOSIS — Z87891 Personal history of nicotine dependence: Secondary | ICD-10-CM | POA: Insufficient documentation

## 2018-01-29 DIAGNOSIS — Z7982 Long term (current) use of aspirin: Secondary | ICD-10-CM | POA: Diagnosis not present

## 2018-01-29 DIAGNOSIS — Z96652 Presence of left artificial knee joint: Secondary | ICD-10-CM | POA: Insufficient documentation

## 2018-01-29 DIAGNOSIS — S0990XA Unspecified injury of head, initial encounter: Secondary | ICD-10-CM | POA: Diagnosis not present

## 2018-01-29 DIAGNOSIS — Z743 Need for continuous supervision: Secondary | ICD-10-CM | POA: Diagnosis not present

## 2018-01-29 DIAGNOSIS — S32010A Wedge compression fracture of first lumbar vertebra, initial encounter for closed fracture: Secondary | ICD-10-CM | POA: Diagnosis not present

## 2018-01-29 DIAGNOSIS — I25118 Atherosclerotic heart disease of native coronary artery with other forms of angina pectoris: Secondary | ICD-10-CM | POA: Diagnosis not present

## 2018-01-29 DIAGNOSIS — W19XXXA Unspecified fall, initial encounter: Secondary | ICD-10-CM | POA: Diagnosis not present

## 2018-01-29 DIAGNOSIS — S199XXA Unspecified injury of neck, initial encounter: Secondary | ICD-10-CM | POA: Diagnosis not present

## 2018-01-29 DIAGNOSIS — R296 Repeated falls: Secondary | ICD-10-CM

## 2018-01-29 DIAGNOSIS — S22080A Wedge compression fracture of T11-T12 vertebra, initial encounter for closed fracture: Secondary | ICD-10-CM | POA: Diagnosis not present

## 2018-01-29 DIAGNOSIS — R410 Disorientation, unspecified: Secondary | ICD-10-CM | POA: Diagnosis not present

## 2018-01-29 NOTE — ED Notes (Signed)
Bed: [facility] Expected date: 01/29/18 Expected time: 1:28 PM Means of arrival: Ambulance Comments: Fall from [facility]

## 2018-01-29 NOTE — ED Notes (Signed)
Patient ambulated with walker approx 40 ft.

## 2018-01-29 NOTE — Discharge Instructions (Signed)
You were evaluated for fall.  CT head and cervical spine did not show any new injuries  X-ray of your thoracic and lumbar spine do not show any changes to your known fractures  Continue all home medications  Return to ER for changes in behavior, confusion, fevers

## 2018-01-29 NOTE — ED Provider Notes (Signed)
[facility] DEPT Provider Note   CSN: [id number] Arrival date & time: 01/29/18  1330     History   Chief Complaint Chief Complaint  Patient presents with  . Fall    HPI [name] is a 83 y.o. male with history of dementia, tachybradycardia syndrome, A. fib with pacemaker, ulcerative colitis, frequent falls with subsequent ER visits is here for evaluation of injuries secondary to witnessed fall.  Onset PTA.  Patient and facility staff member provide history.  Patient is oriented to self and place only but cannot tell me what happens.  He denies having any pain.  Reportedly, patient was attempting to stand up from a chair when he lost balance falling backwards landing directly on his buttocks and hitting the back of his head on the windowsill.  Patient ambulates with walker and one-to-one assist.  There is no LOC.  No anticoagulants.  Per facility member patient is at his baseline.  He has not complained of pain to facility for staff.  Level 5 caveat applies given dementia.   HPI  Past Medical History:  Diagnosis Date  . Anxiety   . Arthritis    "all over" (12/09/2017)  . CAD (coronary artery disease)    a.  cath 5/12: mLAD 50% then tandem 50%, dLAD 30%; D1 30%; OM1 occluded with collateral flow; pRCA 25%; EF 55% with inf HK;  occluded OM likely cause of perfusion defect on Myoview  . Chronic lower back pain   . Colitis   . COPD (chronic obstructive pulmonary disease) ([place])   . Dementia    short term  . Depression   . Dizziness   . Dyslipidemia   . GERD (gastroesophageal reflux disease)   . Hearing disorder   . History of gout   . Hyperlipidemia   . Hypertension   . Hypothyroidism   . Nonspecific abnormal unspecified cardiovascular function study   . Pacemaker 02/01/2006; 04/02/2015  . Pelvic fracture ([place])   . Persistent atrial fibrillation ([place])   . Pneumonia    "twice" (12/09/2017)  . Skin disorder    blister formation  . Tachy-brady  syndrome ([place])    s/p pacer  . Ulcerative colitis     Patient Active Problem List   Diagnosis Date Noted  . Pain in left elbow 12/24/2017  . Lower back injury 12/24/2017  . Closed fracture of one rib of right side   . Multiple contusions   . Fall 12/07/2017  . Adult failure to thrive 11/19/2016  . Hoarseness 02/06/2016  . COPD mixed type ([place]) 08/07/2014  . H/O asbestos exposure 08/07/2014  . Chest pain 07/06/2014  . Coronary artery disease involving native coronary artery of native heart with other form of angina pectoris ([place])   . Anxiety 01/19/2012  . Skin rash 06/08/2011  . Fatigue 12/02/2010  . Coronary atherosclerosis of native coronary artery 10/01/2010  . ARTHRITIS, RIGHT KNEE 09/30/2010  . Radicular leg pain 09/30/2010  . Abnormal cardiovascular function study 09/09/2010  . OTHER MONONEURITIS OF LOWER LIMB 07/16/2010  . UNSPECIFIED MONONEURITIS OF LOWER LIMB 06/05/2010  . ARTHRITIS, RIGHT KNEE 06/05/2010  . LEG PAIN 06/05/2010  . Hyperlipidemia 11/19/2008  . HTN (hypertension) 11/19/2008  . Atrial fibrillation ([place]) 11/19/2008  . Sick sinus syndrome ([place]) 11/19/2008  . ULCERATIVE COLITIS 11/19/2008  . DIZZINESS 11/19/2008  . Pacemaker 11/19/2008    Past Surgical History:  Procedure Laterality Date  . BACK SURGERY    . CARDIAC CATHETERIZATION  09/20/10  EF 55%.....Dr. Percival Spanish  . CATARACT EXTRACTION W/ INTRAOCULAR LENS  IMPLANT, BILATERAL Bilateral   . CATARACT EXTRACTION W/PHACO Left 12/17/2014   Procedure: CATARACT EXTRACTION PHACO AND INTRAOCULAR LENS PLACEMENT (IOC);  Surgeon: Tonny Branch, MD;  Location: [facility];  Service: Ophthalmology;  Laterality: Left;  CDE 7.44  . EP IMPLANTABLE DEVICE N/A 04/02/2015   Procedure:  PPM Generator Changeout;  Surgeon: Thompson Grayer, MD;  Location: [facility];  Service: Cardiovascular;  Laterality: N/A;  . INSERT / REPLACE / REMOVE PACEMAKER  02/01/2006   [employer]....PPM Serial Number:  [id number].....Marland KitchenPPM Model  Number:  [id number]....Marland KitchenPPM DOI:  02/01/2006    . JOINT REPLACEMENT    . KNEE ARTHROSCOPY Left 04/23/2006   w/debridement; [name] M.D  . LEFT HEART CATHETERIZATION WITH CORONARY ANGIOGRAM N/A 07/06/2014   Procedure: LEFT HEART CATHETERIZATION WITH CORONARY ANGIOGRAM;  Surgeon: Sinclair Grooms, MD;  Location: [facility] CATH LAB;  Service: Cardiovascular;  Laterality: N/A;  . [place]  . TONSILLECTOMY    . TOTAL KNEE ARTHROPLASTY Left 05/13/2007        Home Medications    Prior to Admission medications   Medication Sig Start Date End Date Taking? Authorizing Provider  acetaminophen (TYLENOL) 325 MG tablet Take 2 tablets (650 mg total) by mouth every 6 (six) hours as needed for mild pain (or Fever >/= 101). 12/10/17  Yes Asencion Noble, MD  ALPRAZolam Duanne Moron) 0.25 MG tablet Take 1 tablet (0.25 mg total) by mouth 3 (three) times daily as needed for anxiety (agitation (control)). 12/10/17  Yes Asencion Noble, MD  aspirin EC 81 MG tablet Take 81 mg by mouth daily.   Yes [provider]  bacitracin ointment Apply 1 application topically 2 (two) times daily. Please apply to the left arm where skin tears are. 12/10/17  Yes Asencion Noble, MD  cetaphil (CETAPHIL) cream Apply 1 application topically daily. Apply all over skin   Yes [provider]  cholecalciferol 1000 units tablet Take 1 tablet (1,000 Units total) by mouth daily. 12/11/17  Yes Asencion Noble, MD  levothyroxine (SYNTHROID, LEVOTHROID) 50 MCG tablet Take 50 mcg by mouth daily before breakfast.  09/11/13  Yes [provider]  LORazepam (ATIVAN) 2 MG/ML concentrated solution Take 1 mg by mouth every 8 (eight) hours.   Yes [provider]  Melatonin 3 MG TABS Take 3 mg by mouth at bedtime.   Yes [provider]  metoprolol tartrate (LOPRESSOR) 50 MG tablet TAKE ONE TABLET BY MOUTH TWICE A DAY Patient taking differently: Take 50 mg by mouth 2 (two) times daily.  07/05/17   Yes Herminio Commons, MD  sertraline (ZOLOFT) 50 MG tablet Take 50 mg by mouth daily.   Yes [provider]  Skin Protectants, Misc. (DIMETHICONE-ZINC OXIDE) cream Apply 1 application topically 3 (three) times daily.   Yes [provider]    Family History Family History  Problem Relation Age of Onset  . Lung disease Father        Emphysema  . Hypertension Father   . Peptic Ulcer Disease Father   . COPD Father   . Hypertension Mother   . Liver cancer Mother   . Pernicious anemia Mother   . Depression Mother   . Kidney disease Mother   . Colitis Mother   . Asthma Sister   . Healthy Daughter   . Healthy Daughter   . Healthy Son   . COPD Sister   .  Glaucoma Sister   . Hypertension Sister   . Cancer Sister     Social History Social History   Tobacco Use  . Smoking status: Former Smoker    Packs/day: 1.00    Years: 15.00    Pack years: 15.00    Types: Cigarettes    Start date: 05/13/1955    Last attempt to quit: 05/12/1983    Years since quitting: 34.7  . Smokeless tobacco: Never Used  Substance Use Topics  . Alcohol use: No    Alcohol/week: 0.0 standard drinks  . Drug use: No     Allergies   Nsaids and Tolmetin   Review of Systems Review of Systems  Unable to perform ROS: Dementia  All other systems reviewed and are negative.    Physical Exam Updated Vital Signs BP 132/80 (BP Location: Left Arm)   Pulse 80   Temp 98.8 F (37.1 C) (Oral)   Resp 17   SpO2 98%   Physical Exam  Constitutional: He is oriented to person, place, and time. He appears well-developed and well-nourished. He is cooperative. He is easily aroused. No distress.  In no distress laying in hall bed, towel collar in place.   HENT:  Head: Atraumatic.  No abrasions, lacerations, deformity, defect, tenderness or crepitus of facial, nasal, scalp bones. No Raccoon's eyes. No Battle's sign.  No epistaxis or rhinorrhea, septum midline.  No intraoral bleeding or  injury. No malocclusion. Dentures noted.   Eyes: Conjunctivae are normal.  Lids normal. EOMs and PERRL intact.   Neck:  C-spine: no midline or paraspinal muscular tenderness. ROM not assessed.   Cardiovascular: Normal rate, regular rhythm, S1 normal, S2 normal and normal heart sounds. Exam reveals no distant heart sounds.  Pulses:      Radial pulses are 2+ on the right side, and 2+ on the left side.       Dorsalis pedis pulses are 2+ on the right side, and 2+ on the left side.  2+ radial and DP pulses bilaterally  Pulmonary/Chest: Effort normal and breath sounds normal. He has no decreased breath sounds.  No anterior/posterior thorax tenderness. Equal and symmetric chest wall expansion   Abdominal: Soft. Bowel sounds are normal.  Abdomen is NTND. No guarding.   Musculoskeletal: Normal range of motion. He exhibits no deformity.  Full passive ROM of upper and lower extremities without pain T-spine: no paraspinal muscular tenderness or midline tenderness.   L-spine: no paraspinal muscular or midline tenderness.  Pelvis: no instability with AP/L compression, leg shortening or rotation. Full PROM of hips bilaterally without pain.   Neurological: He is alert, oriented to person, place, and time and easily aroused.  Speech is fluent without obvious dysarthria or dysphasia. Strength 5/5 with hand grip and ankle F/E.   Sensation to light touch intact in hands and feet.  Skin: Skin is warm and dry. Capillary refill takes less than 2 seconds. Abrasion noted.     Irregular abrasion over spinous process of low T/upper L spine. No local tenderness reported.   Psychiatric: His behavior is normal. Thought content normal.     ED Treatments / Results  Labs (all labs ordered are listed, but only abnormal results are displayed) Labs Reviewed - No data to display  EKG None  Radiology Dg Thoracic Spine 2 View  Result Date: 01/29/2018 CLINICAL DATA:  Witnessed fall.  Hit back of head. EXAM:  THORACIC SPINE 2 VIEWS COMPARISON:  None. FINDINGS: Two views study shows superior endplate compression fracture at  T12. CT scan from 2 months ago showed evidence of this fracture. T1-2 articulation obscured on the lateral and swimmer's views. No abnormal paraspinal line on the frontal projection. IMPRESSION: T12 superior endplate compression fracture, not substantially changed since 12/07/2017. T1-2 assessed in limited fashion as this level is obscured on sagittal imaging. Electronically Signed   By: Misty Stanley M.D.   On: 01/29/2018 15:09   Dg Lumbar Spine Complete  Result Date: 01/29/2018 CLINICAL DATA:  Fall.  Back pain. EXAM: LUMBAR SPINE - COMPLETE 4+ VIEW COMPARISON:  Abdomen and pelvis CT 12/07/2017. FINDINGS: Convex leftward lumbar scoliosis. Diffuse facet degeneration evident. Superior endplate compression fractures noted at T12 and L1, stable since CT of 12/07/2017. Loss of disc height noted at all lumbar levels with associated endplate spurring. Atherosclerotic calcification noted abdominal aorta. IMPRESSION: Stable compression fractures at T12 and L1 comparing to CT scan of 12/07/2017. Diffuse degenerative changes. Aortic Atherosclerois (ICD10-170.0) Electronically Signed   By: Misty Stanley M.D.   On: 01/29/2018 15:12   Ct Head Wo Contrast  Result Date: 01/29/2018 CLINICAL DATA:  83 year old male with acute head injury and fall today with confusion. EXAM: CT HEAD WITHOUT CONTRAST CT CERVICAL SPINE WITHOUT CONTRAST TECHNIQUE: Multidetector CT imaging of the head and cervical spine was performed following the standard protocol without intravenous contrast. Multiplanar CT image reconstructions of the cervical spine were also generated. COMPARISON:  12/07/2017 and prior CTs FINDINGS: CT HEAD FINDINGS Brain: No evidence of acute infarction, hemorrhage, hydrocephalus, extra-axial collection or mass lesion/mass effect. Atrophy, moderate to severe chronic small-vessel white matter ischemic changes  and ventriculomegaly again noted. Vascular: Vertebral/carotid atherosclerotic calcifications again noted. Skull: Normal. Negative for fracture or focal lesion. Sinuses/Orbits: No acute finding. Other: None. CT CERVICAL SPINE FINDINGS Alignment: Normal. Skull base and vertebrae: No acute fracture. No primary bone lesion or focal pathologic process. Soft tissues and spinal canal: No prevertebral fluid or swelling. No visible canal hematoma. Disc levels: Mild to moderate multilevel degenerative disc disease/spondylosis and facet arthropathy again noted contributing central spinal and foraminal narrowing. Upper chest: No acute abnormality. Other: None IMPRESSION: 1. No evidence of acute intracranial abnormality. Atrophy and chronic small-vessel white matter ischemic changes. 2. No static evidence of acute injury to the cervical spine. Mild to moderate multilevel degenerative changes. Electronically Signed   By: Margarette Canada M.D.   On: 01/29/2018 14:41   Ct Cervical Spine Wo Contrast  Result Date: 01/29/2018 CLINICAL DATA:  83 year old male with acute head injury and fall today with confusion. EXAM: CT HEAD WITHOUT CONTRAST CT CERVICAL SPINE WITHOUT CONTRAST TECHNIQUE: Multidetector CT imaging of the head and cervical spine was performed following the standard protocol without intravenous contrast. Multiplanar CT image reconstructions of the cervical spine were also generated. COMPARISON:  12/07/2017 and prior CTs FINDINGS: CT HEAD FINDINGS Brain: No evidence of acute infarction, hemorrhage, hydrocephalus, extra-axial collection or mass lesion/mass effect. Atrophy, moderate to severe chronic small-vessel white matter ischemic changes and ventriculomegaly again noted. Vascular: Vertebral/carotid atherosclerotic calcifications again noted. Skull: Normal. Negative for fracture or focal lesion. Sinuses/Orbits: No acute finding. Other: None. CT CERVICAL SPINE FINDINGS Alignment: Normal. Skull base and vertebrae: No acute  fracture. No primary bone lesion or focal pathologic process. Soft tissues and spinal canal: No prevertebral fluid or swelling. No visible canal hematoma. Disc levels: Mild to moderate multilevel degenerative disc disease/spondylosis and facet arthropathy again noted contributing central spinal and foraminal narrowing. Upper chest: No acute abnormality. Other: None IMPRESSION: 1. No evidence of acute intracranial abnormality. Atrophy  and chronic small-vessel white matter ischemic changes. 2. No static evidence of acute injury to the cervical spine. Mild to moderate multilevel degenerative changes. Electronically Signed   By: Margarette Canada M.D.   On: 01/29/2018 14:41    Procedures Procedures (including critical care time)  Medications Ordered in ED Medications - No data to display   Initial Impression / Assessment and Plan / ED Course  I have reviewed the triage vital signs and the nursing notes.  Pertinent labs & imaging results that were available during my care of the patient were reviewed by me and considered in my medical decision making (see chart for details).     83 year old here after witnessed mechanical fall.  Exam is reassuring.  No obvious signs of head or cervical spine injury however given age and risk CTs were obtained which did not show new acute injury.  He had a small abrasion to TL spine but x-rays of these areas did not show any changes to his known fractures.  No other signs of significant chest, abdominal, pelvis, extremity injury.  No anticoagulation.  Vital signs WNL and stable in the ER.  Per facility staff at bedside he is at his baseline in terms of dementia.  He ambulated in the ER with a walker approximately 40 feet.  Patient is considered safe for discharge back to nursing facility.  Patient discussed with Dr. Alvino Chapel.  Final Clinical Impressions(s) / ED Diagnoses   Final diagnoses:  Fall, initial encounter  Multiple falls  Dementia without behavioral  disturbance, unspecified dementia type    ED Discharge Orders    None       Arlean Hopping 01/29/18 1614    Davonna Belling, MD 01/30/18 [phone]

## 2018-01-29 NOTE — ED Triage Notes (Signed)
Fell in dining hall at [employer]. Hit back of head per patient. Denies pain. No trauma. Patient has dementia and is at baseline currently. No LOC, No current anticoags.

## 2018-01-29 NOTE — ED Notes (Signed)
PTAR called to transport patient back to [facility] at [facility]. Report called to said facility

## 2018-01-31 DIAGNOSIS — R2681 Unsteadiness on feet: Secondary | ICD-10-CM | POA: Diagnosis not present

## 2018-01-31 DIAGNOSIS — M179 Osteoarthritis of knee, unspecified: Secondary | ICD-10-CM | POA: Diagnosis not present

## 2018-01-31 DIAGNOSIS — F0281 Dementia in other diseases classified elsewhere with behavioral disturbance: Secondary | ICD-10-CM | POA: Diagnosis not present

## 2018-02-03 ENCOUNTER — Telehealth: Payer: Self-pay | Admitting: Licensed Clinical Social Worker

## 2018-02-03 NOTE — Telephone Encounter (Signed)
Palliative Care SW phoned patient home and could not leave a message.  Also called patient's wife, [name], and her cell phone vm was full.

## 2018-02-08 LAB — CUP PACEART REMOTE DEVICE CHECK
Battery Remaining Percentage: 95.5 %
Battery Voltage: 3.02 V
Brady Statistic RV Percent Paced: 14 %
Implantable Lead Implant Date: 20070924
Implantable Lead Location: 753859
Implantable Lead Model: 5076
Implantable Lead Model: 5076
Lead Channel Impedance Value: 410 Ohm
Lead Channel Setting Pacing Pulse Width: 0.4 ms
Lead Channel Setting Sensing Sensitivity: 2 mV
MDC IDC LEAD IMPLANT DT: 20070924
MDC IDC LEAD LOCATION: 753860
MDC IDC MSMT BATTERY REMAINING LONGEVITY: 133 mo
MDC IDC MSMT LEADCHNL RV PACING THRESHOLD AMPLITUDE: 1.25 V
MDC IDC MSMT LEADCHNL RV PACING THRESHOLD PULSEWIDTH: 0.4 ms
MDC IDC MSMT LEADCHNL RV SENSING INTR AMPL: 11.9 mV
MDC IDC PG IMPLANT DT: 20161122
MDC IDC SESS DTM: 20190909060026
MDC IDC SET LEADCHNL RV PACING AMPLITUDE: 2.5 V
Pulse Gen Serial Number: 7826824

## 2018-03-14 DIAGNOSIS — F0390 Unspecified dementia without behavioral disturbance: Secondary | ICD-10-CM | POA: Diagnosis not present

## 2018-03-14 DIAGNOSIS — I10 Essential (primary) hypertension: Secondary | ICD-10-CM | POA: Diagnosis not present

## 2018-03-14 DIAGNOSIS — F0280 Dementia in other diseases classified elsewhere without behavioral disturbance: Secondary | ICD-10-CM | POA: Diagnosis not present

## 2018-03-14 DIAGNOSIS — I4891 Unspecified atrial fibrillation: Secondary | ICD-10-CM | POA: Diagnosis not present

## 2018-03-14 DIAGNOSIS — G301 Alzheimer's disease with late onset: Secondary | ICD-10-CM | POA: Diagnosis not present

## 2018-03-14 DIAGNOSIS — E039 Hypothyroidism, unspecified: Secondary | ICD-10-CM | POA: Diagnosis not present

## 2018-03-14 DIAGNOSIS — Z7982 Long term (current) use of aspirin: Secondary | ICD-10-CM | POA: Diagnosis not present

## 2018-03-14 DIAGNOSIS — Z9181 History of falling: Secondary | ICD-10-CM | POA: Diagnosis not present

## 2018-03-14 DIAGNOSIS — F0150 Vascular dementia without behavioral disturbance: Secondary | ICD-10-CM | POA: Diagnosis not present

## 2018-03-15 DIAGNOSIS — I4891 Unspecified atrial fibrillation: Secondary | ICD-10-CM | POA: Diagnosis not present

## 2018-03-15 DIAGNOSIS — G301 Alzheimer's disease with late onset: Secondary | ICD-10-CM | POA: Diagnosis not present

## 2018-03-15 DIAGNOSIS — I10 Essential (primary) hypertension: Secondary | ICD-10-CM | POA: Diagnosis not present

## 2018-03-15 DIAGNOSIS — F0280 Dementia in other diseases classified elsewhere without behavioral disturbance: Secondary | ICD-10-CM | POA: Diagnosis not present

## 2018-03-15 DIAGNOSIS — F0150 Vascular dementia without behavioral disturbance: Secondary | ICD-10-CM | POA: Diagnosis not present

## 2018-03-15 DIAGNOSIS — E039 Hypothyroidism, unspecified: Secondary | ICD-10-CM | POA: Diagnosis not present

## 2018-03-16 DIAGNOSIS — I4891 Unspecified atrial fibrillation: Secondary | ICD-10-CM | POA: Diagnosis not present

## 2018-03-16 DIAGNOSIS — F0150 Vascular dementia without behavioral disturbance: Secondary | ICD-10-CM | POA: Diagnosis not present

## 2018-03-16 DIAGNOSIS — R269 Unspecified abnormalities of gait and mobility: Secondary | ICD-10-CM | POA: Diagnosis not present

## 2018-03-16 DIAGNOSIS — Z79899 Other long term (current) drug therapy: Secondary | ICD-10-CM | POA: Diagnosis not present

## 2018-03-16 DIAGNOSIS — I10 Essential (primary) hypertension: Secondary | ICD-10-CM | POA: Diagnosis not present

## 2018-03-18 DIAGNOSIS — G301 Alzheimer's disease with late onset: Secondary | ICD-10-CM | POA: Diagnosis not present

## 2018-03-18 DIAGNOSIS — F0280 Dementia in other diseases classified elsewhere without behavioral disturbance: Secondary | ICD-10-CM | POA: Diagnosis not present

## 2018-03-18 DIAGNOSIS — I4891 Unspecified atrial fibrillation: Secondary | ICD-10-CM | POA: Diagnosis not present

## 2018-03-18 DIAGNOSIS — F0150 Vascular dementia without behavioral disturbance: Secondary | ICD-10-CM | POA: Diagnosis not present

## 2018-03-18 DIAGNOSIS — E039 Hypothyroidism, unspecified: Secondary | ICD-10-CM | POA: Diagnosis not present

## 2018-03-18 DIAGNOSIS — I10 Essential (primary) hypertension: Secondary | ICD-10-CM | POA: Diagnosis not present

## 2018-03-21 DIAGNOSIS — F0150 Vascular dementia without behavioral disturbance: Secondary | ICD-10-CM | POA: Diagnosis not present

## 2018-03-21 DIAGNOSIS — G301 Alzheimer's disease with late onset: Secondary | ICD-10-CM | POA: Diagnosis not present

## 2018-03-21 DIAGNOSIS — I10 Essential (primary) hypertension: Secondary | ICD-10-CM | POA: Diagnosis not present

## 2018-03-21 DIAGNOSIS — E039 Hypothyroidism, unspecified: Secondary | ICD-10-CM | POA: Diagnosis not present

## 2018-03-21 DIAGNOSIS — I4891 Unspecified atrial fibrillation: Secondary | ICD-10-CM | POA: Diagnosis not present

## 2018-03-21 DIAGNOSIS — F0280 Dementia in other diseases classified elsewhere without behavioral disturbance: Secondary | ICD-10-CM | POA: Diagnosis not present

## 2018-03-23 DIAGNOSIS — F0280 Dementia in other diseases classified elsewhere without behavioral disturbance: Secondary | ICD-10-CM | POA: Diagnosis not present

## 2018-03-23 DIAGNOSIS — G301 Alzheimer's disease with late onset: Secondary | ICD-10-CM | POA: Diagnosis not present

## 2018-03-23 DIAGNOSIS — E039 Hypothyroidism, unspecified: Secondary | ICD-10-CM | POA: Diagnosis not present

## 2018-03-23 DIAGNOSIS — I10 Essential (primary) hypertension: Secondary | ICD-10-CM | POA: Diagnosis not present

## 2018-03-23 DIAGNOSIS — I4891 Unspecified atrial fibrillation: Secondary | ICD-10-CM | POA: Diagnosis not present

## 2018-03-23 DIAGNOSIS — F0150 Vascular dementia without behavioral disturbance: Secondary | ICD-10-CM | POA: Diagnosis not present

## 2018-03-29 DIAGNOSIS — I10 Essential (primary) hypertension: Secondary | ICD-10-CM | POA: Diagnosis not present

## 2018-03-29 DIAGNOSIS — F0150 Vascular dementia without behavioral disturbance: Secondary | ICD-10-CM | POA: Diagnosis not present

## 2018-03-29 DIAGNOSIS — F0280 Dementia in other diseases classified elsewhere without behavioral disturbance: Secondary | ICD-10-CM | POA: Diagnosis not present

## 2018-03-29 DIAGNOSIS — E039 Hypothyroidism, unspecified: Secondary | ICD-10-CM | POA: Diagnosis not present

## 2018-03-29 DIAGNOSIS — G301 Alzheimer's disease with late onset: Secondary | ICD-10-CM | POA: Diagnosis not present

## 2018-03-29 DIAGNOSIS — I4891 Unspecified atrial fibrillation: Secondary | ICD-10-CM | POA: Diagnosis not present

## 2018-03-30 ENCOUNTER — Other Ambulatory Visit: Payer: Medicare Other | Admitting: *Deleted

## 2018-03-30 ENCOUNTER — Other Ambulatory Visit: Payer: Medicare Other | Admitting: Licensed Clinical Social Worker

## 2018-03-30 DIAGNOSIS — Z515 Encounter for palliative care: Secondary | ICD-10-CM

## 2018-04-01 NOTE — Progress Notes (Signed)
COMMUNITY PALLIATIVE CARE SW NOTE  PATIENT NAME: [name] DOB: 13-Jan-1935 MRN: [id number]  PRIMARY CARE PROVIDER: [facility]  RESPONSIBLE PARTY:  Acct ID - Guarantor Home Phone Work Phone Relationship Acct Type  [id number] [name]586 [phone]  Self P/F     [address], [place], [place] [address]     PLAN OF CARE and INTERVENTIONS:             1. GOALS OF CARE/ ADVANCE CARE PLANNING:  Patient's family wishes for him to remain at home as long as possible.  He has a DNR. 2. SOCIAL/EMOTIONAL/SPIRITUAL ASSESSMENT/ INTERVENTIONS:  SW and Palliative Care RN, [name], met with patient, his daughter, [name], and [facility] caregiver, [name], in patient's home at the [employer].  Patient was sleeping in his recliner and responded minimally even when RN obtained BP.  Patient's wife lives at [place].  Patient has lived with his daughter since October.  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  Patient has another daughter who is very supportive and lives in [place].  His son is minimally involved per [name].  Patient is of [employer]  There is currently no church involvement.  Patient can feed himself if his food is cut into small pieces and he is prompted.  SW provided active listening while daughter discussed patient's loss of interest in previous activities.  She stated she will consider moving patient to a SNF once he becomes bedridden.  3. PATIENT/CAREGIVER EDUCATION/ COPING:  Provided education regarding the  Palliative Care Program.  Daughter expresses her feelings openly. 4. PERSONAL EMERGENCY PLAN:  Daughter will contact MD or EMS. 5. COMMUNITY RESOURCES COORDINATION/ HEALTH CARE NAVIGATION:  Patient has 24/7 caregivers with [facility]. 6. FINANCIAL/LEGAL CONCERNS/INTERVENTIONS:  Daughter expressed future financial concerns.     SOCIAL HX:  Social History   Tobacco Use  . Smoking status:  Former Smoker    Packs/day: 1.00    Years: 15.00    Pack years: 15.00    Types: Cigarettes    Start date: 05/13/1955    Last attempt to quit: 05/12/1983    Years since quitting: 34.9  . Smokeless tobacco: Never Used  Substance Use Topics  . Alcohol use: No    Alcohol/week: 0.0 standard drinks    CODE STATUS:  DNR ADVANCED DIRECTIVES: N MOST FORM COMPLETE:  N HOSPICE EDUCATION PROVIDED: Provided education regarding Hospice criteria and services provided. PPS:  Patient's appetite is normal.  He can stand independently and uses a walker, but requires increased prompting. Duration of visit and documentation:  75 minutes.      Creola Corn Anamarie Hunn, LCSW

## 2018-04-04 NOTE — Progress Notes (Signed)
COMMUNITY PALLIATIVE CARE RN NOTE  PATIENT NAME: [name] DOB: 20-Apr-1935 MRN: [id number]  PRIMARY CARE PROVIDER: [facility]  RESPONSIBLE PARTY:  Acct ID - Guarantor Home Phone Work Phone Relationship Acct Type  [id number] [name]  Self P/F     [address], [place], [place] [address]    PLAN OF CARE and INTERVENTION:  1. ADVANCE CARE PLANNING/GOALS OF CARE: Daughter wants him to remain in their apartment with 24/7 caregivers 2. PATIENT/CAREGIVER EDUCATION: Explained Palliative Care Services and Signs of Disease Progression 3. DISEASE STATUS: Joint visit made with Palliative Care SW, Lynn Duffy. Met with patient and his daughter in their apartment. Daughter is currently renting an apartment to care for patient there. He was previously at [facility] facility and experienced frequent falls. He currently has 24/7 caregivers through [facility]. Patient is currently sitting up in recliner asleep, and remained asleep throughout entire visit. He went out earlier today to the barbershop, which may explain part of his lethargy today. He appears comfortable without any physical indicators of pain noted. Daughter states that he does grimace at times when moving his R knee in the am. He has a hx of R knee replacement. Also has a hx of back surgery and arthritis. She will give Tylenol if pain seems to linger. He requires much prompting with any activity. He is unable to stand without assistance. They have been having more difficulty getting him to follow commands. He is able to ambulate at times with a walker and 1 person assistance. At other times, he requires transport via wheelchair. Two days ago, he was very weak, and couldn't bear any weight. He is moderately to severely confused and this varies daily. Daughter states his speech is often nonsensical. He sleeps most of the time, but there are other times in which he may be up all night. He used to have  combative episodes while at the facility. Since being at the apartment, if he seems uncooperative, sitter will re approach at a later time. They also have topical Lorazepam which is helpful. Daughter states that he seems to be happier in this current location. He is able to feed himself using utensils. They have to cut all foods and give small portions. Intake is currently on a decline. She recently placed some crackers and a drink beside patient on the bedside table, and he fell asleep and would not wake up to eat it. At times, he coughs whether drinking through a straw or without. Question aspiration. No coughing with solid foods noted. He eats most meals while sitting up at the dining table and takes his medications without difficulty. He is incontinent of both bowel and bladder and wears Depends. Daughter states that nothing interests him anymore. He used to paint and read, but now mainly sleeps. Daughter is agreeable to future visits from Palliative Care.  HISTORY OF PRESENT ILLNESS:  This is a 83 yo male who currently resides home in his apartment with his daughter. Palliative Care Team asked to follow patient for goals of care, symptom management and transfer to hospice when appropriate. Next visit scheduled in 2 weeks.  CODE STATUS: DNR  ADVANCED DIRECTIVES: N MOST FORM: no PPS: 30%   PHYSICAL EXAM:   VITALS: Today's Vitals   03/30/18 1614  BP: 126/78  Pulse: 71  Resp: 16  Temp: 98.3 F (36.8 C)  TempSrc: Temporal  SpO2: 97%  PainSc: 0-No pain    LUNGS: clear to auscultation  CARDIAC: Cor RRR EXTREMITIES: No edema SKIN: Exposed skin is dry and intact  NEURO: Asleep, lethargic, generalized weakness, ambulatory with 1 person assistance and walker short distances   (Duration of visit and documentation 90 minutes)    [name], RN, BSN

## 2018-04-06 DIAGNOSIS — F0280 Dementia in other diseases classified elsewhere without behavioral disturbance: Secondary | ICD-10-CM | POA: Diagnosis not present

## 2018-04-06 DIAGNOSIS — G301 Alzheimer's disease with late onset: Secondary | ICD-10-CM | POA: Diagnosis not present

## 2018-04-06 DIAGNOSIS — E039 Hypothyroidism, unspecified: Secondary | ICD-10-CM | POA: Diagnosis not present

## 2018-04-06 DIAGNOSIS — I10 Essential (primary) hypertension: Secondary | ICD-10-CM | POA: Diagnosis not present

## 2018-04-06 DIAGNOSIS — F0150 Vascular dementia without behavioral disturbance: Secondary | ICD-10-CM | POA: Diagnosis not present

## 2018-04-06 DIAGNOSIS — I4891 Unspecified atrial fibrillation: Secondary | ICD-10-CM | POA: Diagnosis not present

## 2018-04-13 ENCOUNTER — Other Ambulatory Visit: Payer: Medicare Other | Admitting: Licensed Clinical Social Worker

## 2018-04-13 DIAGNOSIS — F5109 Other insomnia not due to a substance or known physiological condition: Secondary | ICD-10-CM | POA: Diagnosis not present

## 2018-04-13 DIAGNOSIS — F0280 Dementia in other diseases classified elsewhere without behavioral disturbance: Secondary | ICD-10-CM | POA: Diagnosis not present

## 2018-04-13 DIAGNOSIS — F0150 Vascular dementia without behavioral disturbance: Secondary | ICD-10-CM | POA: Diagnosis not present

## 2018-04-13 DIAGNOSIS — R627 Adult failure to thrive: Secondary | ICD-10-CM | POA: Diagnosis not present

## 2018-04-13 DIAGNOSIS — K5909 Other constipation: Secondary | ICD-10-CM | POA: Diagnosis not present

## 2018-04-13 DIAGNOSIS — I10 Essential (primary) hypertension: Secondary | ICD-10-CM | POA: Diagnosis not present

## 2018-04-13 DIAGNOSIS — Z515 Encounter for palliative care: Secondary | ICD-10-CM

## 2018-04-13 DIAGNOSIS — I4891 Unspecified atrial fibrillation: Secondary | ICD-10-CM | POA: Diagnosis not present

## 2018-04-13 DIAGNOSIS — E039 Hypothyroidism, unspecified: Secondary | ICD-10-CM | POA: Diagnosis not present

## 2018-04-13 DIAGNOSIS — G301 Alzheimer's disease with late onset: Secondary | ICD-10-CM | POA: Diagnosis not present

## 2018-04-14 NOTE — Progress Notes (Signed)
COMMUNITY PALLIATIVE CARE SW NOTE  PATIENT NAME: [name] DOB: 1934/07/26 MRN: [id number]  PRIMARY CARE PROVIDER: [facility]  RESPONSIBLE PARTY:  Acct ID - Guarantor Home Phone Work Phone Relationship Acct Type  [id number] [name]  Self P/F     [place], [place],  [address]       PLAN OF CARE and INTERVENTIONS:             1. GOALS OF CARE/ ADVANCE CARE PLANNING:  Patient's family wishes for him to remain at home as long as possible.  He has a DNR. 2. SOCIAL/EMOTIONAL/SPIRITUAL ASSESSMENT/ INTERVENTIONS:  SW met with patient, his daughter, [name], and [facility] caregiver, [name], in patient's home at the [facility] [employer].  Patient appeared to be sleeping initially, but aroused easily to verba/tactile prompts.  He denied pain.  [name] said he feel a few days ago and his lip was visibly swollen and healing from a cut.  He could not follow directions when daughter and caregiver attempted to weigh him.  [name] stated he is sleeping more, but sporadically.  He will pick at his clothes and sheets.  [name], the NP, was also present and made medication recommendations. [name] and [name] discussed the possibility of a Hospice referral at this time.  SW informed Palliative Care RN, [name]. 3. PATIENT/CAREGIVER EDUCATION/ COPING:  Daughter expresses her feelings openly. 4. PERSONAL EMERGENCY PLAN:  Daughter will contact MD or EMS. 5. COMMUNITY RESOURCES COORDINATION/ HEALTH CARE NAVIGATION:  Patient has 24/7 caregivers with [facility]. 6.  FINANCIAL/LEGAL CONCERNS/INTERVENTIONS:  Daughter expressed future financial concerns.  SOCIAL HX:  Social History   Tobacco Use  . Smoking status: Former Smoker    Packs/day: 1.00    Years: 15.00    Pack years: 15.00    Types: Cigarettes    Start date: 05/13/1955    Last attempt to quit: 05/12/1983    Years since quitting: 34.9  . Smokeless tobacco: Never Used  Substance Use Topics  . Alcohol  use: No    Alcohol/week: 0.0 standard drinks    CODE STATUS:  DNR ADVANCED DIRECTIVES: N MOST FORM COMPLETE:  N HOSPICE EDUCATION PROVIDED: Y PPS:  Daughter reports patient's appetite has decreased.  He requires assistance standing from his lift chair.  He uses a walker with increased prompting.      William Corn Arnie Maiolo, LCSW

## 2018-04-15 DIAGNOSIS — I10 Essential (primary) hypertension: Secondary | ICD-10-CM | POA: Diagnosis not present

## 2018-04-15 DIAGNOSIS — F0280 Dementia in other diseases classified elsewhere without behavioral disturbance: Secondary | ICD-10-CM | POA: Diagnosis not present

## 2018-04-15 DIAGNOSIS — I4891 Unspecified atrial fibrillation: Secondary | ICD-10-CM | POA: Diagnosis not present

## 2018-04-15 DIAGNOSIS — F0150 Vascular dementia without behavioral disturbance: Secondary | ICD-10-CM | POA: Diagnosis not present

## 2018-04-15 DIAGNOSIS — G301 Alzheimer's disease with late onset: Secondary | ICD-10-CM | POA: Diagnosis not present

## 2018-04-15 DIAGNOSIS — E039 Hypothyroidism, unspecified: Secondary | ICD-10-CM | POA: Diagnosis not present

## 2018-04-18 ENCOUNTER — Ambulatory Visit (INDEPENDENT_AMBULATORY_CARE_PROVIDER_SITE_OTHER): Payer: Medicare Other

## 2018-04-18 DIAGNOSIS — I495 Sick sinus syndrome: Secondary | ICD-10-CM

## 2018-04-18 DIAGNOSIS — E039 Hypothyroidism, unspecified: Secondary | ICD-10-CM | POA: Diagnosis not present

## 2018-04-18 DIAGNOSIS — G301 Alzheimer's disease with late onset: Secondary | ICD-10-CM | POA: Diagnosis not present

## 2018-04-18 DIAGNOSIS — I4891 Unspecified atrial fibrillation: Secondary | ICD-10-CM | POA: Diagnosis not present

## 2018-04-18 DIAGNOSIS — F0280 Dementia in other diseases classified elsewhere without behavioral disturbance: Secondary | ICD-10-CM | POA: Diagnosis not present

## 2018-04-18 DIAGNOSIS — F0150 Vascular dementia without behavioral disturbance: Secondary | ICD-10-CM | POA: Diagnosis not present

## 2018-04-18 DIAGNOSIS — I10 Essential (primary) hypertension: Secondary | ICD-10-CM | POA: Diagnosis not present

## 2018-04-19 NOTE — Progress Notes (Signed)
Remote pacemaker transmission.   

## 2018-04-20 DIAGNOSIS — F0150 Vascular dementia without behavioral disturbance: Secondary | ICD-10-CM | POA: Diagnosis not present

## 2018-04-20 DIAGNOSIS — I4891 Unspecified atrial fibrillation: Secondary | ICD-10-CM | POA: Diagnosis not present

## 2018-04-20 DIAGNOSIS — G301 Alzheimer's disease with late onset: Secondary | ICD-10-CM | POA: Diagnosis not present

## 2018-04-20 DIAGNOSIS — I10 Essential (primary) hypertension: Secondary | ICD-10-CM | POA: Diagnosis not present

## 2018-04-20 DIAGNOSIS — E039 Hypothyroidism, unspecified: Secondary | ICD-10-CM | POA: Diagnosis not present

## 2018-04-20 DIAGNOSIS — F0280 Dementia in other diseases classified elsewhere without behavioral disturbance: Secondary | ICD-10-CM | POA: Diagnosis not present

## 2018-04-21 ENCOUNTER — Telehealth: Payer: Self-pay | Admitting: *Deleted

## 2018-04-21 NOTE — Telephone Encounter (Signed)
Contacted patient's daughter [name] to arrange a home visit. Visit scheduled for 04/27/18 at 1p.

## 2018-04-27 ENCOUNTER — Other Ambulatory Visit: Payer: Medicare Other | Admitting: *Deleted

## 2018-04-27 DIAGNOSIS — Z515 Encounter for palliative care: Secondary | ICD-10-CM

## 2018-04-29 NOTE — Progress Notes (Signed)
COMMUNITY PALLIATIVE CARE RN NOTE  PATIENT NAME: [name] DOB: 20-Nov-1934 MRN: [id number]  PRIMARY CARE PROVIDER: [facility]  RESPONSIBLE PARTY:  Acct ID - Guarantor Home Phone Work Phone Relationship Acct Type  [id number] [name]  Self P/F     [place], [place], [place] [address]    PLAN OF CARE and INTERVENTION:  1. ADVANCE CARE PLANNING/GOALS OF CARE: Daughter wants patient to remain at home and avoid going to the hospital 2. PATIENT/CAREGIVER EDUCATION: Reinforced Safe Mobility/Transfers, Disease Progression, Behavior and Pain Management 3. DISEASE STATUS: Met with patient, daughter [name] and hired caregiver in patient's home. He is initially asleep while sitting up in his wheelchair. Arouses with verbal and tactile stimulation. He is quiet and only speaks when spoken to. Intermittently confused. Daughter states that every day is different with patient. He is sleeping more overall and longer into the morning. There are some days where he is having difficulty sleeping during the night, even when given Ativan gel. She has tried giving a half of 0.25 mg tablet of Xanax, but feels that this makes him too drowsy the next day. At times he can be combative and uncooperative. He is reaching and picking at things in the air more often when lying down. He has required much more prompting with any activity and having more difficulty following commands. She feels that patient may be experiencing pain, mainly in his knees and back. She has started giving patient Tylenol in the am and pm to help. Also more noted behaviors as the evening approaches, so Ativan gel is being applied routinely after dinner, which has helped some. He requires 1 person assistance with all ADLs, and there are times where he needs 2 person assistance for standing/transfers. At times they have difficulty getting patient to get out of his bed or chair. They are unsure if it's because he  doesn't want to, or simply just can't. He is incontinent of both bowel and bladder. His best meal of the day is breakfast, but for other meals it is hit or miss. He is now coughing more after drinking thin liquids and recently had a few episodes of severe coughing while eating solid foods to the point of almost vomiting. Afterwards, patient just stopped eating and would not complete the meals. There are times where he is unable to feed himself. He appears thinner overall and clothes are fitting more loosely. Daughter is interested in having hospice services. Spoke with Hospice Medical Director and Staff Physician to review case. They feel patient is now eligible for hospice services. Contacted [facility] and spoke with receptionist requesting a verbal order for a hospice consult. She advised she will call back after speaking with Provider. Awaiting call back. Contacted daughter and made aware of this information.   HISTORY OF PRESENT ILLNESS:  This is a 83 yo male who resides in his home with hired caregivers 24/7. Palliative Care Team awaiting orders for hospice consult.   CODE STATUS: DNR ADVANCED DIRECTIVES: N MOST FORM: no PPS: 30%   PHYSICAL EXAM:   VITALS: Today's Vitals   04/27/18 1327  BP: 102/62  Pulse: 62  Resp: 16  Temp: 97.7 F (36.5 C)  TempSrc: Temporal  SpO2: 96%  PainSc: 0-No pain    LUNGS: clear to auscultation  CARDIAC: Cor RRR EXTREMITIES: No edema SKIN: Scattered bruising noted to bilateral arms/hands  NEURO: Alert and oriented to self only, drowsy, confused, minimally engaging, increased generalized weakness, requires  assitance with standing/transfers   (Duration of visit and documentation 90 minutes)    [name], RN, BSN

## 2018-05-03 DIAGNOSIS — J449 Chronic obstructive pulmonary disease, unspecified: Secondary | ICD-10-CM | POA: Diagnosis not present

## 2018-05-03 DIAGNOSIS — I10 Essential (primary) hypertension: Secondary | ICD-10-CM | POA: Diagnosis not present

## 2018-05-03 DIAGNOSIS — I25119 Atherosclerotic heart disease of native coronary artery with unspecified angina pectoris: Secondary | ICD-10-CM | POA: Diagnosis not present

## 2018-05-03 DIAGNOSIS — E039 Hypothyroidism, unspecified: Secondary | ICD-10-CM | POA: Diagnosis not present

## 2018-05-03 DIAGNOSIS — I679 Cerebrovascular disease, unspecified: Secondary | ICD-10-CM | POA: Diagnosis not present

## 2018-05-03 DIAGNOSIS — I495 Sick sinus syndrome: Secondary | ICD-10-CM | POA: Diagnosis not present

## 2018-05-03 DIAGNOSIS — F0150 Vascular dementia without behavioral disturbance: Secondary | ICD-10-CM | POA: Diagnosis not present

## 2018-05-03 DIAGNOSIS — E785 Hyperlipidemia, unspecified: Secondary | ICD-10-CM | POA: Diagnosis not present

## 2018-05-03 DIAGNOSIS — K5190 Ulcerative colitis, unspecified, without complications: Secondary | ICD-10-CM | POA: Diagnosis not present

## 2018-05-03 DIAGNOSIS — R1310 Dysphagia, unspecified: Secondary | ICD-10-CM | POA: Diagnosis not present

## 2018-05-03 DIAGNOSIS — I4891 Unspecified atrial fibrillation: Secondary | ICD-10-CM | POA: Diagnosis not present

## 2018-05-03 DIAGNOSIS — F339 Major depressive disorder, recurrent, unspecified: Secondary | ICD-10-CM | POA: Diagnosis not present

## 2018-05-03 DIAGNOSIS — J301 Allergic rhinitis due to pollen: Secondary | ICD-10-CM | POA: Diagnosis not present

## 2018-05-04 DIAGNOSIS — I679 Cerebrovascular disease, unspecified: Secondary | ICD-10-CM | POA: Diagnosis not present

## 2018-05-04 DIAGNOSIS — I4891 Unspecified atrial fibrillation: Secondary | ICD-10-CM | POA: Diagnosis not present

## 2018-05-04 DIAGNOSIS — R1310 Dysphagia, unspecified: Secondary | ICD-10-CM | POA: Diagnosis not present

## 2018-05-04 DIAGNOSIS — F0150 Vascular dementia without behavioral disturbance: Secondary | ICD-10-CM | POA: Diagnosis not present

## 2018-05-04 DIAGNOSIS — I25119 Atherosclerotic heart disease of native coronary artery with unspecified angina pectoris: Secondary | ICD-10-CM | POA: Diagnosis not present

## 2018-05-04 DIAGNOSIS — I495 Sick sinus syndrome: Secondary | ICD-10-CM | POA: Diagnosis not present

## 2018-05-05 DIAGNOSIS — R1310 Dysphagia, unspecified: Secondary | ICD-10-CM | POA: Diagnosis not present

## 2018-05-05 DIAGNOSIS — I495 Sick sinus syndrome: Secondary | ICD-10-CM | POA: Diagnosis not present

## 2018-05-05 DIAGNOSIS — I4891 Unspecified atrial fibrillation: Secondary | ICD-10-CM | POA: Diagnosis not present

## 2018-05-05 DIAGNOSIS — F0150 Vascular dementia without behavioral disturbance: Secondary | ICD-10-CM | POA: Diagnosis not present

## 2018-05-05 DIAGNOSIS — I679 Cerebrovascular disease, unspecified: Secondary | ICD-10-CM | POA: Diagnosis not present

## 2018-05-05 DIAGNOSIS — I25119 Atherosclerotic heart disease of native coronary artery with unspecified angina pectoris: Secondary | ICD-10-CM | POA: Diagnosis not present

## 2018-05-06 DIAGNOSIS — I4891 Unspecified atrial fibrillation: Secondary | ICD-10-CM | POA: Diagnosis not present

## 2018-05-06 DIAGNOSIS — F0150 Vascular dementia without behavioral disturbance: Secondary | ICD-10-CM | POA: Diagnosis not present

## 2018-05-06 DIAGNOSIS — I679 Cerebrovascular disease, unspecified: Secondary | ICD-10-CM | POA: Diagnosis not present

## 2018-05-06 DIAGNOSIS — R1310 Dysphagia, unspecified: Secondary | ICD-10-CM | POA: Diagnosis not present

## 2018-05-06 DIAGNOSIS — I25119 Atherosclerotic heart disease of native coronary artery with unspecified angina pectoris: Secondary | ICD-10-CM | POA: Diagnosis not present

## 2018-05-06 DIAGNOSIS — I495 Sick sinus syndrome: Secondary | ICD-10-CM | POA: Diagnosis not present

## 2018-05-08 DIAGNOSIS — I25119 Atherosclerotic heart disease of native coronary artery with unspecified angina pectoris: Secondary | ICD-10-CM | POA: Diagnosis not present

## 2018-05-08 DIAGNOSIS — F0150 Vascular dementia without behavioral disturbance: Secondary | ICD-10-CM | POA: Diagnosis not present

## 2018-05-08 DIAGNOSIS — I679 Cerebrovascular disease, unspecified: Secondary | ICD-10-CM | POA: Diagnosis not present

## 2018-05-08 DIAGNOSIS — I4891 Unspecified atrial fibrillation: Secondary | ICD-10-CM | POA: Diagnosis not present

## 2018-05-08 DIAGNOSIS — R1310 Dysphagia, unspecified: Secondary | ICD-10-CM | POA: Diagnosis not present

## 2018-05-08 DIAGNOSIS — I495 Sick sinus syndrome: Secondary | ICD-10-CM | POA: Diagnosis not present

## 2018-05-09 DIAGNOSIS — F0150 Vascular dementia without behavioral disturbance: Secondary | ICD-10-CM | POA: Diagnosis not present

## 2018-05-09 DIAGNOSIS — I4891 Unspecified atrial fibrillation: Secondary | ICD-10-CM | POA: Diagnosis not present

## 2018-05-09 DIAGNOSIS — I679 Cerebrovascular disease, unspecified: Secondary | ICD-10-CM | POA: Diagnosis not present

## 2018-05-09 DIAGNOSIS — I495 Sick sinus syndrome: Secondary | ICD-10-CM | POA: Diagnosis not present

## 2018-05-09 DIAGNOSIS — R1310 Dysphagia, unspecified: Secondary | ICD-10-CM | POA: Diagnosis not present

## 2018-05-09 DIAGNOSIS — I25119 Atherosclerotic heart disease of native coronary artery with unspecified angina pectoris: Secondary | ICD-10-CM | POA: Diagnosis not present

## 2018-05-11 DIAGNOSIS — E785 Hyperlipidemia, unspecified: Secondary | ICD-10-CM | POA: Diagnosis not present

## 2018-05-11 DIAGNOSIS — I10 Essential (primary) hypertension: Secondary | ICD-10-CM | POA: Diagnosis not present

## 2018-05-11 DIAGNOSIS — R1310 Dysphagia, unspecified: Secondary | ICD-10-CM | POA: Diagnosis not present

## 2018-05-11 DIAGNOSIS — J301 Allergic rhinitis due to pollen: Secondary | ICD-10-CM | POA: Diagnosis not present

## 2018-05-11 DIAGNOSIS — I679 Cerebrovascular disease, unspecified: Secondary | ICD-10-CM | POA: Diagnosis not present

## 2018-05-11 DIAGNOSIS — F339 Major depressive disorder, recurrent, unspecified: Secondary | ICD-10-CM | POA: Diagnosis not present

## 2018-05-11 DIAGNOSIS — I495 Sick sinus syndrome: Secondary | ICD-10-CM | POA: Diagnosis not present

## 2018-05-11 DIAGNOSIS — J449 Chronic obstructive pulmonary disease, unspecified: Secondary | ICD-10-CM | POA: Diagnosis not present

## 2018-05-11 DIAGNOSIS — I25119 Atherosclerotic heart disease of native coronary artery with unspecified angina pectoris: Secondary | ICD-10-CM | POA: Diagnosis not present

## 2018-05-11 DIAGNOSIS — E039 Hypothyroidism, unspecified: Secondary | ICD-10-CM | POA: Diagnosis not present

## 2018-05-11 DIAGNOSIS — I4891 Unspecified atrial fibrillation: Secondary | ICD-10-CM | POA: Diagnosis not present

## 2018-05-11 DIAGNOSIS — K5190 Ulcerative colitis, unspecified, without complications: Secondary | ICD-10-CM | POA: Diagnosis not present

## 2018-05-11 DIAGNOSIS — F0150 Vascular dementia without behavioral disturbance: Secondary | ICD-10-CM | POA: Diagnosis not present

## 2018-05-13 DIAGNOSIS — F0150 Vascular dementia without behavioral disturbance: Secondary | ICD-10-CM | POA: Diagnosis not present

## 2018-05-13 DIAGNOSIS — R1310 Dysphagia, unspecified: Secondary | ICD-10-CM | POA: Diagnosis not present

## 2018-05-13 DIAGNOSIS — I679 Cerebrovascular disease, unspecified: Secondary | ICD-10-CM | POA: Diagnosis not present

## 2018-05-13 DIAGNOSIS — I495 Sick sinus syndrome: Secondary | ICD-10-CM | POA: Diagnosis not present

## 2018-05-13 DIAGNOSIS — I4891 Unspecified atrial fibrillation: Secondary | ICD-10-CM | POA: Diagnosis not present

## 2018-05-13 DIAGNOSIS — I25119 Atherosclerotic heart disease of native coronary artery with unspecified angina pectoris: Secondary | ICD-10-CM | POA: Diagnosis not present

## 2018-05-15 DIAGNOSIS — I4891 Unspecified atrial fibrillation: Secondary | ICD-10-CM | POA: Diagnosis not present

## 2018-05-15 DIAGNOSIS — I679 Cerebrovascular disease, unspecified: Secondary | ICD-10-CM | POA: Diagnosis not present

## 2018-05-15 DIAGNOSIS — R1310 Dysphagia, unspecified: Secondary | ICD-10-CM | POA: Diagnosis not present

## 2018-05-15 DIAGNOSIS — F0150 Vascular dementia without behavioral disturbance: Secondary | ICD-10-CM | POA: Diagnosis not present

## 2018-05-15 DIAGNOSIS — I495 Sick sinus syndrome: Secondary | ICD-10-CM | POA: Diagnosis not present

## 2018-05-15 DIAGNOSIS — I25119 Atherosclerotic heart disease of native coronary artery with unspecified angina pectoris: Secondary | ICD-10-CM | POA: Diagnosis not present

## 2018-05-16 DIAGNOSIS — R41841 Cognitive communication deficit: Secondary | ICD-10-CM | POA: Diagnosis not present

## 2018-05-16 DIAGNOSIS — E039 Hypothyroidism, unspecified: Secondary | ICD-10-CM | POA: Diagnosis not present

## 2018-05-16 DIAGNOSIS — F0150 Vascular dementia without behavioral disturbance: Secondary | ICD-10-CM | POA: Diagnosis not present

## 2018-05-16 DIAGNOSIS — J449 Chronic obstructive pulmonary disease, unspecified: Secondary | ICD-10-CM | POA: Diagnosis not present

## 2018-05-16 DIAGNOSIS — I10 Essential (primary) hypertension: Secondary | ICD-10-CM | POA: Diagnosis not present

## 2018-05-19 DIAGNOSIS — R1310 Dysphagia, unspecified: Secondary | ICD-10-CM | POA: Diagnosis not present

## 2018-05-19 DIAGNOSIS — I4891 Unspecified atrial fibrillation: Secondary | ICD-10-CM | POA: Diagnosis not present

## 2018-05-19 DIAGNOSIS — I495 Sick sinus syndrome: Secondary | ICD-10-CM | POA: Diagnosis not present

## 2018-05-19 DIAGNOSIS — I679 Cerebrovascular disease, unspecified: Secondary | ICD-10-CM | POA: Diagnosis not present

## 2018-05-19 DIAGNOSIS — I25119 Atherosclerotic heart disease of native coronary artery with unspecified angina pectoris: Secondary | ICD-10-CM | POA: Diagnosis not present

## 2018-05-19 DIAGNOSIS — F0150 Vascular dementia without behavioral disturbance: Secondary | ICD-10-CM | POA: Diagnosis not present

## 2018-05-20 DIAGNOSIS — F0150 Vascular dementia without behavioral disturbance: Secondary | ICD-10-CM | POA: Diagnosis not present

## 2018-05-20 DIAGNOSIS — I495 Sick sinus syndrome: Secondary | ICD-10-CM | POA: Diagnosis not present

## 2018-05-20 DIAGNOSIS — R1310 Dysphagia, unspecified: Secondary | ICD-10-CM | POA: Diagnosis not present

## 2018-05-20 DIAGNOSIS — I4891 Unspecified atrial fibrillation: Secondary | ICD-10-CM | POA: Diagnosis not present

## 2018-05-20 DIAGNOSIS — I25119 Atherosclerotic heart disease of native coronary artery with unspecified angina pectoris: Secondary | ICD-10-CM | POA: Diagnosis not present

## 2018-05-20 DIAGNOSIS — I679 Cerebrovascular disease, unspecified: Secondary | ICD-10-CM | POA: Diagnosis not present

## 2018-05-23 DIAGNOSIS — I25119 Atherosclerotic heart disease of native coronary artery with unspecified angina pectoris: Secondary | ICD-10-CM | POA: Diagnosis not present

## 2018-05-23 DIAGNOSIS — I495 Sick sinus syndrome: Secondary | ICD-10-CM | POA: Diagnosis not present

## 2018-05-23 DIAGNOSIS — I679 Cerebrovascular disease, unspecified: Secondary | ICD-10-CM | POA: Diagnosis not present

## 2018-05-23 DIAGNOSIS — I4891 Unspecified atrial fibrillation: Secondary | ICD-10-CM | POA: Diagnosis not present

## 2018-05-23 DIAGNOSIS — F0150 Vascular dementia without behavioral disturbance: Secondary | ICD-10-CM | POA: Diagnosis not present

## 2018-05-23 DIAGNOSIS — R1310 Dysphagia, unspecified: Secondary | ICD-10-CM | POA: Diagnosis not present

## 2018-06-04 LAB — CUP PACEART REMOTE DEVICE CHECK
Battery Remaining Longevity: 133 mo
Battery Remaining Percentage: 95.5 %
Date Time Interrogation Session: 20191209070014
Implantable Lead Implant Date: 20070924
Implantable Lead Location: 753859
Implantable Lead Location: 753860
Implantable Lead Model: 5076
Implantable Pulse Generator Implant Date: 20161122
Lead Channel Pacing Threshold Pulse Width: 0.4 ms
Lead Channel Setting Sensing Sensitivity: 2 mV
MDC IDC LEAD IMPLANT DT: 20070924
MDC IDC MSMT BATTERY VOLTAGE: 3.01 V
MDC IDC MSMT LEADCHNL RV IMPEDANCE VALUE: 400 Ohm
MDC IDC MSMT LEADCHNL RV PACING THRESHOLD AMPLITUDE: 1.25 V
MDC IDC MSMT LEADCHNL RV SENSING INTR AMPL: 12 mV
MDC IDC SET LEADCHNL RV PACING AMPLITUDE: 2.5 V
MDC IDC SET LEADCHNL RV PACING PULSEWIDTH: 0.4 ms
MDC IDC STAT BRADY RV PERCENT PACED: 11 %
Pulse Gen Model: 2240
Pulse Gen Serial Number: 7826824

## 2018-06-11 DEATH — deceased

## 2019-08-05 IMAGING — CT CT ABD-PELV W/ CM
2 of 5 series · 16 of 46 positions shown, 18 images · IV contrast (APPLIED)
Comparison: None.

CLINICAL DATA: Multiple recent falls. Dementia. Right upper
quadrant abdominal pain.

EXAM:
CT ABDOMEN AND PELVIS WITH CONTRAST
TECHNIQUE: Multidetector CT imaging of the abdomen and pelvis was performed
using the standard protocol following bolus administration of
intravenous contrast.
CONTRAST:  100mL OMNIPAQUE IOHEXOL 300 MG/ML  SOLN

[Series 2: abd/ pelvis 5.0 i30f 2 · axial · 0.83mm/px · z∈[-376,+104]mm · 13 of 108 slices shown, 15 images]
[im 6/108  soft-tissue]
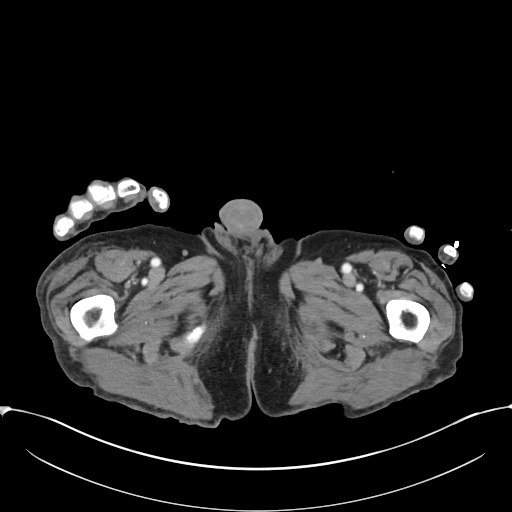
[im 6/108  bone]
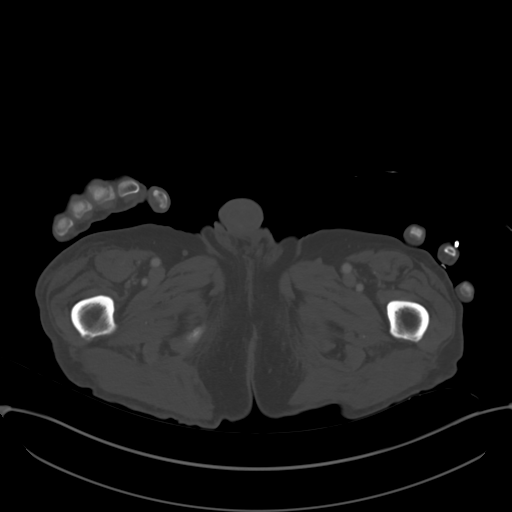
[im 17/108  soft-tissue]
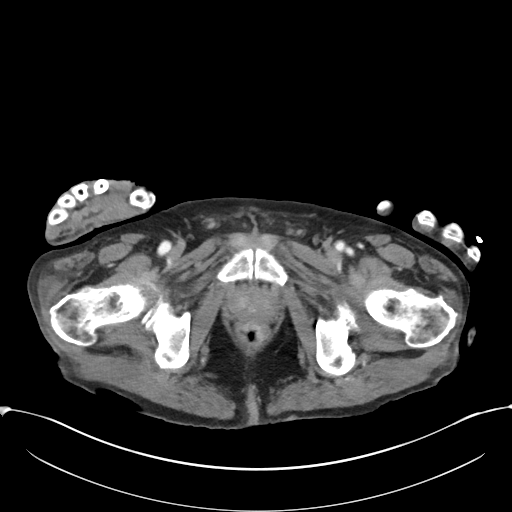
[im 23/108  soft-tissue]
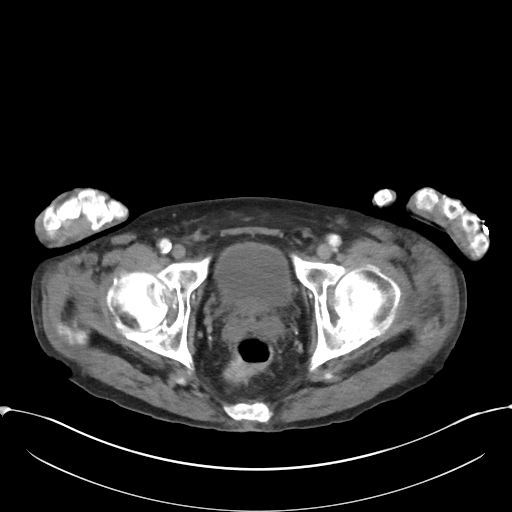
[im 29/108  soft-tissue]
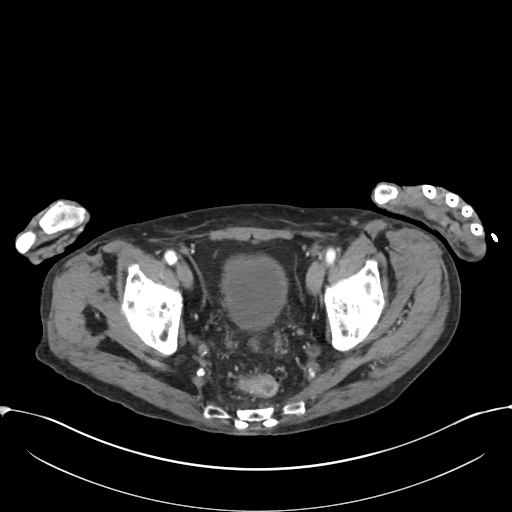
[im 40/108  soft-tissue]
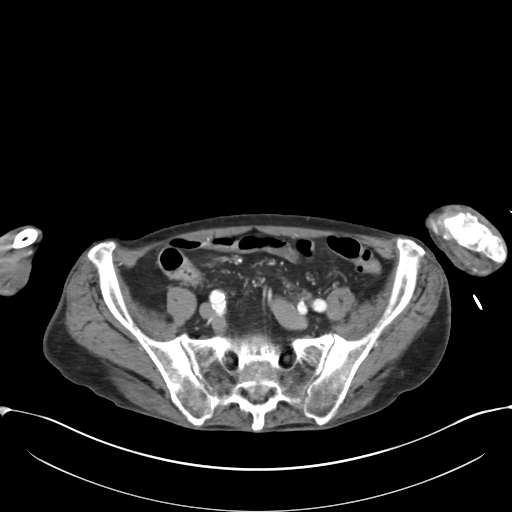
[im 46/108  soft-tissue]
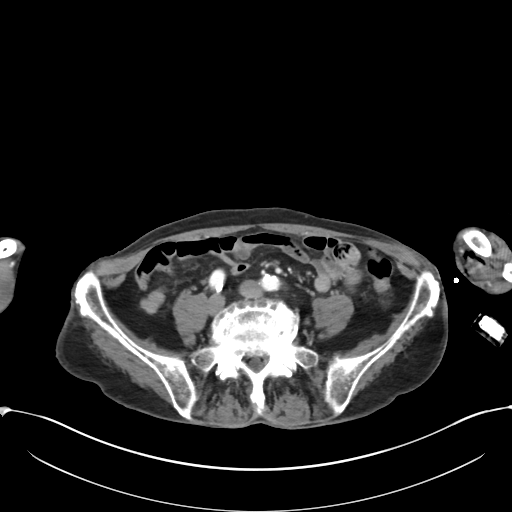
[im 57/108  soft-tissue]
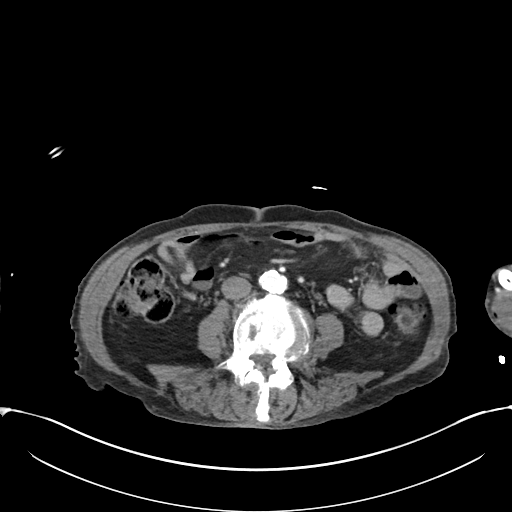
[im 62/108  soft-tissue]
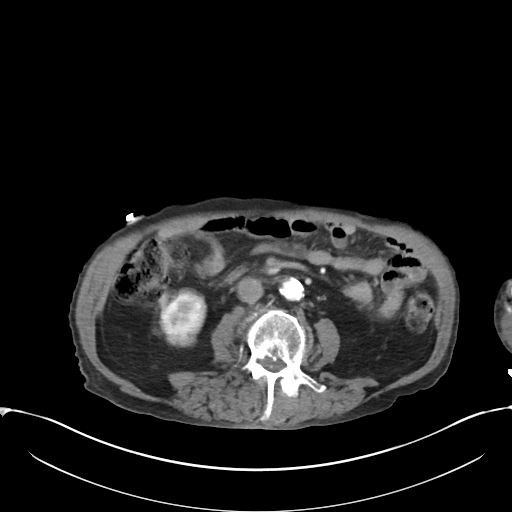
[im 68/108  soft-tissue]
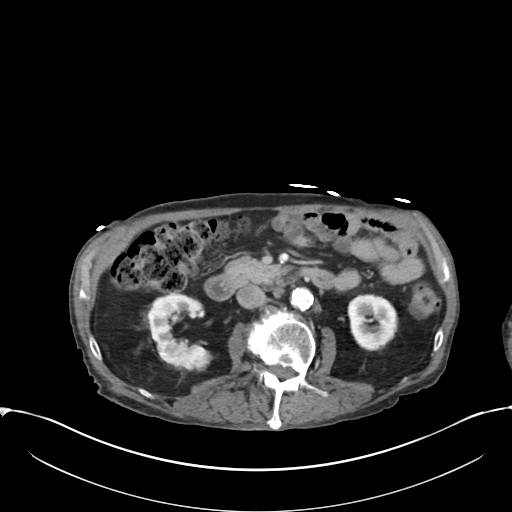
[im 68/108  bone]
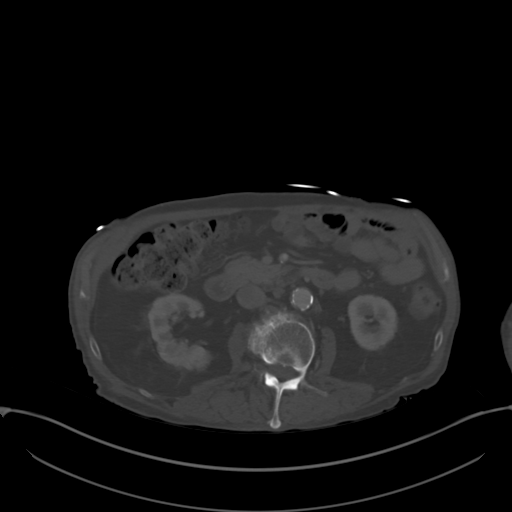
[im 79/108  soft-tissue]
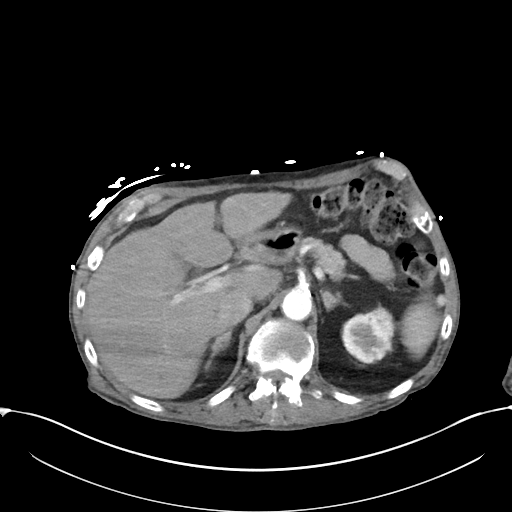
[im 85/108  soft-tissue]
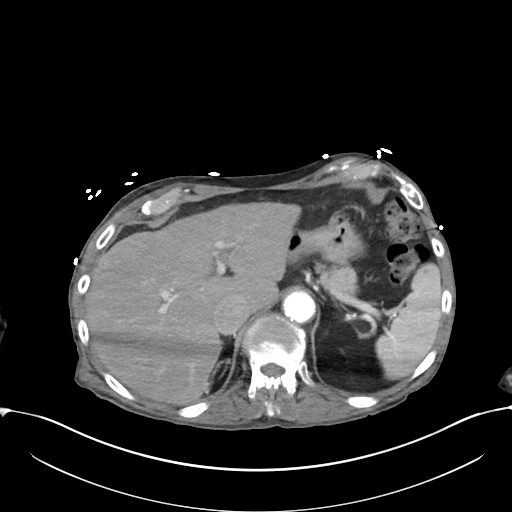
[im 91/108  soft-tissue]
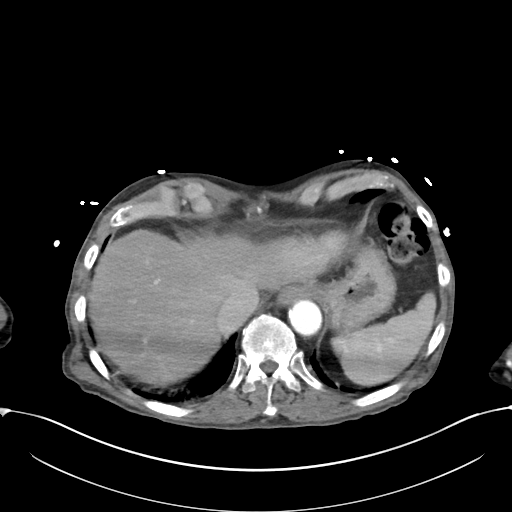
[im 102/108  soft-tissue]
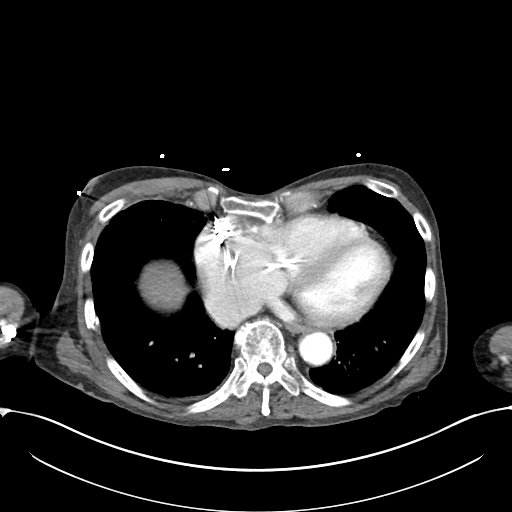

[Series 5: coronal soft tissue · coronal · 0.94mm/px · 3 of 76 slices shown]
[im 26/76  soft-tissue]
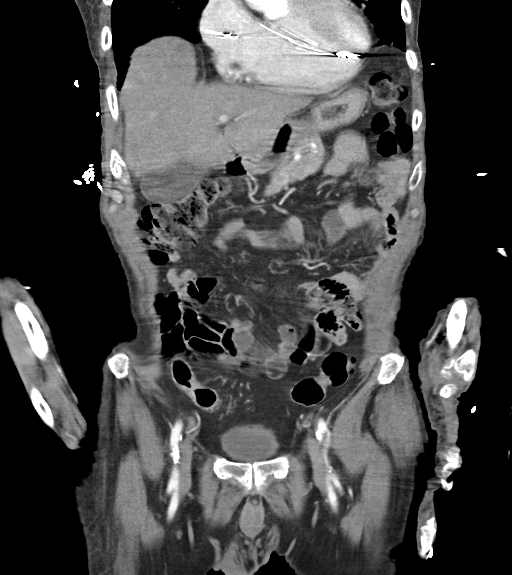
[im 34/76  soft-tissue]
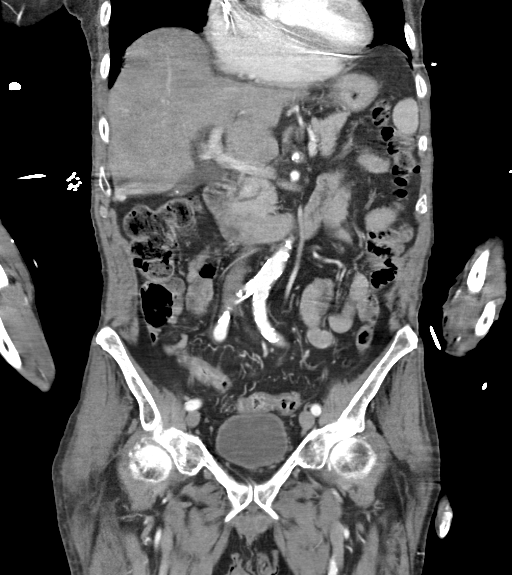
[im 42/76  soft-tissue]
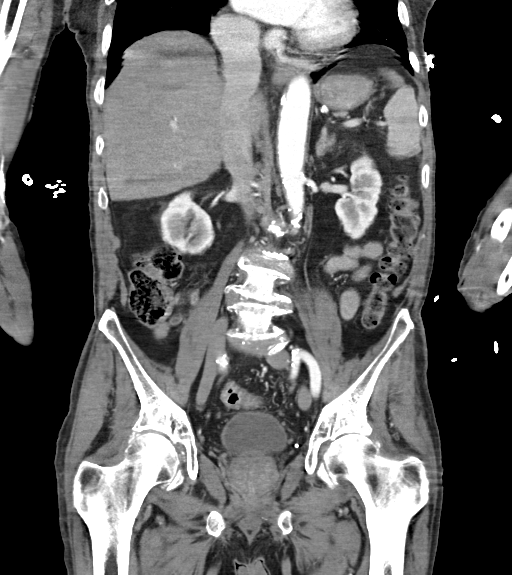

[16 of 46 positions shown; findings below may reference images not displayed]

FINDINGS: Lower chest: Normal

Hepatobiliary: Mild fatty change of the liver. No evidence of liver
injury or significant focal finding. There are a few small
gallstones dependent in the gallbladder without CT evidence of
cholecystitis or biliary obstruction.

Pancreas: Normal

Spleen: Normal

Adrenals/Urinary Tract: Adrenal glands are normal. Few small renal
cysts. No evidence of renal injury. No stone or mass. No
hydronephrosis.

Stomach/Bowel: No bowel abnormality.

Vascular/Lymphatic: Aortic atherosclerosis. No aneurysm. IVC is
normal. No retroperitoneal adenopathy.

Reproductive: Normal

Other: No free fluid or air.

Musculoskeletal: Fracture of a lower right lateral rib, possibly rib
number 7. curvature in degenerative change of the spine. Superior
endplate fractures at T12 and L1 with minor loss of height that
could be acute or subacute. No retropulsed bone.
IMPRESSION: No traumatic intra abdominal or pelvic finding.

Right lateral rib fracture, possibly rib 7. No pneumothorax or
hemothorax or evidence of liver injury.

Superior endplate fractures at T12 and L1 with minor loss of height
that could be acute or subacute.

Mild fatty change of the liver.  Small gallstones.

Aortic atherosclerosis.

Lumbar curvature and degenerative changes.
